# Patient Record
Sex: Female | Born: 1980
Health system: Southern US, Community
[De-identification: ages and names within clinical notes are randomized; demographics above are authoritative.]

## PROBLEM LIST (undated history)

## (undated) DIAGNOSIS — G47419 Narcolepsy without cataplexy: Secondary | ICD-10-CM

## (undated) DIAGNOSIS — F32A Depression, unspecified: Secondary | ICD-10-CM

## (undated) DIAGNOSIS — M1990 Unspecified osteoarthritis, unspecified site: Secondary | ICD-10-CM

## (undated) DIAGNOSIS — K219 Gastro-esophageal reflux disease without esophagitis: Secondary | ICD-10-CM

## (undated) DIAGNOSIS — G43701 Chronic migraine without aura, not intractable, with status migrainosus: Secondary | ICD-10-CM

## (undated) DIAGNOSIS — G709 Myoneural disorder, unspecified: Secondary | ICD-10-CM

## (undated) DIAGNOSIS — H6993 Unspecified Eustachian tube disorder, bilateral: Secondary | ICD-10-CM

## (undated) DIAGNOSIS — J302 Other seasonal allergic rhinitis: Secondary | ICD-10-CM

## (undated) DIAGNOSIS — F319 Bipolar disorder, unspecified: Secondary | ICD-10-CM

## (undated) DIAGNOSIS — I493 Ventricular premature depolarization: Secondary | ICD-10-CM

## (undated) DIAGNOSIS — D649 Anemia, unspecified: Secondary | ICD-10-CM

## (undated) DIAGNOSIS — J342 Deviated nasal septum: Secondary | ICD-10-CM

## (undated) DIAGNOSIS — L719 Rosacea, unspecified: Secondary | ICD-10-CM

## (undated) DIAGNOSIS — T7840XA Allergy, unspecified, initial encounter: Secondary | ICD-10-CM

## (undated) DIAGNOSIS — J45909 Unspecified asthma, uncomplicated: Secondary | ICD-10-CM

## (undated) DIAGNOSIS — B019 Varicella without complication: Secondary | ICD-10-CM

## (undated) DIAGNOSIS — F419 Anxiety disorder, unspecified: Secondary | ICD-10-CM

## (undated) DIAGNOSIS — F4310 Post-traumatic stress disorder, unspecified: Secondary | ICD-10-CM

## (undated) HISTORY — DX: Allergy, unspecified, initial encounter: T78.40XA

## (undated) HISTORY — DX: Myoneural disorder, unspecified: G70.9

## (undated) HISTORY — DX: Unspecified eustachian tube disorder, bilateral: H69.93

## (undated) HISTORY — DX: Deviated nasal septum: J34.2

## (undated) HISTORY — DX: Ventricular premature depolarization: I49.3

## (undated) HISTORY — DX: Varicella without complication: B01.9

## (undated) HISTORY — DX: Chronic migraine without aura, not intractable, with status migrainosus: G43.701

## (undated) HISTORY — PX: WISDOM TOOTH EXTRACTION: SHX21

## (undated) HISTORY — DX: Anemia, unspecified: D64.9

## (undated) HISTORY — PX: OTHER SURGICAL HISTORY: SHX169

## (undated) HISTORY — DX: Narcolepsy without cataplexy: G47.419

## (undated) HISTORY — DX: Anxiety disorder, unspecified: F41.9

## (undated) HISTORY — DX: Unspecified asthma, uncomplicated: J45.909

## (undated) HISTORY — DX: Other seasonal allergic rhinitis: J30.2

## (undated) HISTORY — DX: Unspecified osteoarthritis, unspecified site: M19.90

## (undated) HISTORY — DX: Post-traumatic stress disorder, unspecified: F43.10

## (undated) HISTORY — DX: Gastro-esophageal reflux disease without esophagitis: K21.9

## (undated) HISTORY — DX: Rosacea, unspecified: L71.9

## (undated) HISTORY — DX: Depression, unspecified: F32.A

## (undated) HISTORY — DX: Bipolar disorder, unspecified: F31.9

---

## 2004-01-15 ENCOUNTER — Inpatient Hospital Stay (HOSPITAL_COMMUNITY): Admission: AD | Admit: 2004-01-15 | Discharge: 2004-01-15 | Payer: Self-pay | Admitting: Obstetrics and Gynecology

## 2004-01-18 ENCOUNTER — Inpatient Hospital Stay (HOSPITAL_COMMUNITY): Admission: AD | Admit: 2004-01-18 | Discharge: 2004-01-18 | Payer: Self-pay | Admitting: Obstetrics and Gynecology

## 2004-03-03 ENCOUNTER — Inpatient Hospital Stay (HOSPITAL_COMMUNITY): Admission: AD | Admit: 2004-03-03 | Discharge: 2004-03-03 | Payer: Self-pay | Admitting: Obstetrics & Gynecology

## 2004-03-11 ENCOUNTER — Inpatient Hospital Stay (HOSPITAL_COMMUNITY): Admission: AD | Admit: 2004-03-11 | Discharge: 2004-03-13 | Payer: Self-pay | Admitting: Obstetrics and Gynecology

## 2004-06-28 ENCOUNTER — Other Ambulatory Visit: Admission: RE | Admit: 2004-06-28 | Discharge: 2004-06-28 | Payer: Self-pay | Admitting: Obstetrics and Gynecology

## 2005-04-24 ENCOUNTER — Other Ambulatory Visit: Admission: RE | Admit: 2005-04-24 | Discharge: 2005-04-24 | Payer: Self-pay | Admitting: Obstetrics and Gynecology

## 2005-04-26 ENCOUNTER — Encounter: Admission: RE | Admit: 2005-04-26 | Discharge: 2005-04-26 | Payer: Self-pay | Admitting: Obstetrics and Gynecology

## 2005-10-25 ENCOUNTER — Encounter: Admission: RE | Admit: 2005-10-25 | Discharge: 2005-10-25 | Payer: Self-pay | Admitting: Obstetrics and Gynecology

## 2006-06-20 ENCOUNTER — Emergency Department (HOSPITAL_COMMUNITY): Admission: EM | Admit: 2006-06-20 | Discharge: 2006-06-20 | Payer: Self-pay | Admitting: Family Medicine

## 2006-11-07 ENCOUNTER — Emergency Department (HOSPITAL_COMMUNITY): Admission: EM | Admit: 2006-11-07 | Discharge: 2006-11-07 | Payer: Self-pay | Admitting: Family Medicine

## 2006-11-17 ENCOUNTER — Emergency Department (HOSPITAL_COMMUNITY): Admission: EM | Admit: 2006-11-17 | Discharge: 2006-11-17 | Payer: Self-pay | Admitting: Emergency Medicine

## 2006-12-06 ENCOUNTER — Emergency Department (HOSPITAL_COMMUNITY): Admission: EM | Admit: 2006-12-06 | Discharge: 2006-12-06 | Payer: Self-pay | Admitting: Family Medicine

## 2006-12-14 ENCOUNTER — Inpatient Hospital Stay (HOSPITAL_COMMUNITY): Admission: AD | Admit: 2006-12-14 | Discharge: 2006-12-15 | Payer: Self-pay | Admitting: Obstetrics and Gynecology

## 2007-01-23 ENCOUNTER — Ambulatory Visit (HOSPITAL_COMMUNITY): Admission: RE | Admit: 2007-01-23 | Discharge: 2007-01-23 | Payer: Self-pay | Admitting: Obstetrics and Gynecology

## 2007-02-20 ENCOUNTER — Inpatient Hospital Stay (HOSPITAL_COMMUNITY): Admission: AD | Admit: 2007-02-20 | Discharge: 2007-02-22 | Payer: Self-pay | Admitting: Obstetrics and Gynecology

## 2007-03-14 ENCOUNTER — Ambulatory Visit (HOSPITAL_COMMUNITY): Admission: RE | Admit: 2007-03-14 | Discharge: 2007-03-14 | Payer: Self-pay | Admitting: Obstetrics and Gynecology

## 2007-03-14 ENCOUNTER — Inpatient Hospital Stay (HOSPITAL_COMMUNITY): Admission: AD | Admit: 2007-03-14 | Discharge: 2007-03-14 | Payer: Self-pay | Admitting: Obstetrics and Gynecology

## 2007-03-15 ENCOUNTER — Encounter: Admission: RE | Admit: 2007-03-15 | Discharge: 2007-03-15 | Payer: Self-pay | Admitting: Obstetrics and Gynecology

## 2007-03-15 ENCOUNTER — Ambulatory Visit (HOSPITAL_COMMUNITY): Admission: RE | Admit: 2007-03-15 | Discharge: 2007-03-15 | Payer: Self-pay | Admitting: Obstetrics and Gynecology

## 2007-04-15 ENCOUNTER — Inpatient Hospital Stay (HOSPITAL_COMMUNITY): Admission: AD | Admit: 2007-04-15 | Discharge: 2007-04-16 | Payer: Self-pay | Admitting: Obstetrics and Gynecology

## 2007-05-05 ENCOUNTER — Inpatient Hospital Stay (HOSPITAL_COMMUNITY): Admission: AD | Admit: 2007-05-05 | Discharge: 2007-05-09 | Payer: Self-pay | Admitting: Obstetrics and Gynecology

## 2007-06-05 ENCOUNTER — Inpatient Hospital Stay (HOSPITAL_COMMUNITY): Admission: AD | Admit: 2007-06-05 | Discharge: 2007-06-07 | Payer: Self-pay | Admitting: Obstetrics and Gynecology

## 2007-08-30 ENCOUNTER — Encounter: Admission: RE | Admit: 2007-08-30 | Discharge: 2007-08-30 | Payer: Self-pay | Admitting: Orthopaedic Surgery

## 2007-09-24 ENCOUNTER — Ambulatory Visit (HOSPITAL_BASED_OUTPATIENT_CLINIC_OR_DEPARTMENT_OTHER): Admission: RE | Admit: 2007-09-24 | Discharge: 2007-09-24 | Payer: Self-pay | Admitting: Orthopaedic Surgery

## 2008-05-05 ENCOUNTER — Emergency Department (HOSPITAL_BASED_OUTPATIENT_CLINIC_OR_DEPARTMENT_OTHER): Admission: EM | Admit: 2008-05-05 | Discharge: 2008-05-05 | Payer: Self-pay | Admitting: Emergency Medicine

## 2008-05-12 ENCOUNTER — Emergency Department (HOSPITAL_BASED_OUTPATIENT_CLINIC_OR_DEPARTMENT_OTHER): Admission: EM | Admit: 2008-05-12 | Discharge: 2008-05-12 | Payer: Self-pay | Admitting: Emergency Medicine

## 2008-05-30 ENCOUNTER — Emergency Department (HOSPITAL_BASED_OUTPATIENT_CLINIC_OR_DEPARTMENT_OTHER): Admission: EM | Admit: 2008-05-30 | Discharge: 2008-05-30 | Payer: Self-pay | Admitting: Emergency Medicine

## 2008-11-05 ENCOUNTER — Ambulatory Visit (HOSPITAL_COMMUNITY): Admission: RE | Admit: 2008-11-05 | Discharge: 2008-11-05 | Payer: Self-pay | Admitting: Orthopaedic Surgery

## 2010-03-06 ENCOUNTER — Emergency Department (HOSPITAL_COMMUNITY): Admission: EM | Admit: 2010-03-06 | Discharge: 2010-03-06 | Payer: Self-pay | Admitting: Emergency Medicine

## 2010-07-19 ENCOUNTER — Ambulatory Visit (HOSPITAL_BASED_OUTPATIENT_CLINIC_OR_DEPARTMENT_OTHER): Admission: RE | Admit: 2010-07-19 | Discharge: 2010-07-19 | Payer: Self-pay | Admitting: Orthopaedic Surgery

## 2010-12-11 ENCOUNTER — Encounter: Payer: Self-pay | Admitting: Obstetrics and Gynecology

## 2011-02-03 LAB — POCT HEMOGLOBIN-HEMACUE: Hemoglobin: 11.6 g/dL — ABNORMAL LOW (ref 12.0–15.0)

## 2011-04-04 NOTE — Discharge Summary (Signed)
Debra Nelson, Debra Nelson             ACCOUNT NO.:  [id number]   MEDICAL RECORD NO.:  [id number]          PATIENT TYPE:  INP   LOCATION:  9314                          FACILITY:  [facility]   PHYSICIAN:  Malva Limes, M.D.    DATE OF BIRTH:  07/12/1981   DATE OF ADMISSION:  02/20/2007  DATE OF DISCHARGE:  02/22/2007                               DISCHARGE SUMMARY   FINAL DIAGNOSIS:  Intrauterine gestation at 23 weeks, second trimester  nausea and vomiting, past history of preterm labor, depressive symptoms.   COMPLICATIONS:  None.   HISTORY OF PRESENT ILLNESS:  This 30 year old gravida 3, para 2 presents  at 107 weeks gestation complaining of nausea and vomiting since the prior  night. She thought it was a viral illness but came back and the patient  is currently on Zofran 8 mg t.i.d., Lexapro 10 mg, Scopolamine patch,  Protonix and Delalutin.  The patient had a history of preterm labor with  past pregnancy and is on Delalutin weekly injections this pregnancy. The  patient has also had this history of nausea and vomiting.  Therefore is  on some antiemetic medications. Otherwise the patient's antepartum  course up to this point had otherwise been uncomplicated.   HOSPITAL COURSE:  She was admitted and lab work was obtained.  The  patient was started on IV fluids, Zofran, Phenergan, Pepcid. A right  upper quadrant ultrasound was obtained to rule out gallstones. The  patient's labs were essentially normal and she had another negative  urinalysis as well. The patient was continued to be was watched in-  house. The patient continued to take medicines, tried to take Phenergan  rectal suppositories. She was feeling much better as long as she takes  her meds and ready to go home. She was sent home by Dr. Malva Limes on  February 22, 2007.  She was sent home on a bland diet, told to continue  taking her Reglan 10 mg every 6 hours, to continue her Phenergan as  needed and Zofran and prenatal vitamins,  was to follow up in our office  the next couple of days, of course to call if this continuous nausea and  vomiting returns.   PRIOR LABS ON DISCHARGE:  Relatively normal. Hemoglobin of 12.0, white  blood cell count of 10.5, platelets 318,000. Normal creatinine and  normal liver enzymes, and normal thyroid TSH.      Debra Nelson, P.A.-C.    ______________________________  Malva Limes, M.D.    MB/MEDQ  D:  04/09/2007  T:  04/09/2007  Job:  [id number]

## 2011-04-04 NOTE — Op Note (Signed)
Debra Nelson, Debra Nelson             ACCOUNT NO.:  [id number]   MEDICAL RECORD NO.:  [id number]          PATIENT TYPE:  AMB   LOCATION:  DSC                          FACILITY:  [facility]   PHYSICIAN:  Lubertha Basque. Dalldorf, M.D.DATE OF BIRTH:  1981/11/05   DATE OF PROCEDURE:  09/24/2007  DATE OF DISCHARGE:  09/24/2007                               OPERATIVE REPORT   PREOPERATIVE DIAGNOSIS:  Right knee torn medial meniscus.   POSTOPERATIVE DIAGNOSIS:  Right knee plica.   PROCEDURE:  Right knee plica excision.   ANESTHESIA:  Knee block and general.   ATTENDING SURGEON:  Lubertha Basque. Jerl Santos, M.D.   ASSISTANT:  Lindwood Qua, P.A.-C.   INDICATIONS FOR PROCEDURE:  The patient is a 30 year old woman who  injured her knee about 3-4 weeks ago and has persisted with buckling  episodes and an inability to fully extend.  By MRI, she has a small  peripheral medial meniscus tear.  With an inability to fully extend her  knee and failing to respond to intra-articular injection, she is offered  an arthroscopy.  Informed operative consent was obtained after a  discussion of possible complications of reaction to anesthesia and  infection.   SUMMARY OF FINDINGS AND PROCEDURE:  Under knee block followed by a  general anesthetic, an arthroscopy of the right knee was performed.  Suprapatellar pouch was benign as was the patellofemoral joint.  She did  have a pathologic medial shelf plica which I excised.  Medial and  lateral compartments were benign with no evidence of meniscal or  articular cartilage injury.  The ACL was intact.   DESCRIPTION OF PROCEDURE:  The patient was taken to the operating suite  where a knee block was applied.  She was positioned supine and prepped  and draped in a normal sterile fashion.  After the administration of IV  Kefzol, an arthroscopy right knee was initiated through two portals.  She had difficulty with pain and after supplemental anesthetic around  portal was  unsuccessful, we elected to proceed to an LMA general  anesthetic.  Once comfortable, we then performed the procedure.  The  findings were as noted above.  The procedure consisted of excision of  the medial shelf plica.  I probed meniscal and articular cartilages in  both compartments as well as the patellofemoral joint and no significant  damage could be found.  Her ACL appeared completely normal.  The knee  was thoroughly irrigated followed by placement of Marcaine with  epinephrine and morphine.  Adaptic was placed over the portals followed  by dry gauze and a loose Ace wrap.  Estimated blood loss and  intraoperative fluids can be obtained from anesthesia records.   DISPOSITION:  The patient was extubated in the operating room and taken  to the recovery room in stable addition.  She was to go home same day  and follow up in the office once a week.  I will contact her by phone  tonight.      Lubertha Basque Jerl Santos, M.D.  Electronically Signed     PGD/MEDQ  D:  09/24/2007  T:  09/24/2007  Job:  [id number]

## 2011-04-04 NOTE — Discharge Summary (Signed)
Debra Nelson, GUEST             ACCOUNT NO.:  [id number]   MEDICAL RECORD NO.:  [id number]          PATIENT TYPE:  INP   LOCATION:  9159                          [facility]ILITY:  [facility]   PHYSICIAN:  Malva Limes, M.D.    DATE OF BIRTH:  12-Oct-1981   DATE OF ADMISSION:  05/05/2007  DATE OF DISCHARGE:  05/09/2007                               DISCHARGE SUMMARY   FINAL DIAGNOSES:  1. Intrauterine pregnancy 33+ weeks' gestation.  2. Malaise.  3. Headache.  4. Preterm uterine contractions.   COMPLICATIONS:  None.   HOSPITAL COURSE:  This 30 year old G3, P2-0-0-2 presents at 33+ weeks'  gestation, complaining of a single episode of blood and questionable  fluid lying down her leg earlier; this stopped immediately and she was  told to come directly to the [facility].  On admission, this was  no longer happening and an ultrasound was ordered.  At this point, the  patient became unresponsive, but awoke oriented.  Her blood pressure had  been low at that time, but her blood sugar came back to be 95.  All of  the patient's bleeding stopped.  She was kept in the hospital for  observation.  On ultrasound, the cervix was 3.8 cm with a posterior  placenta, no sign of abruption and a normal AFI.  During this time, the  patient started to have some on and off headaches, was not feeling well  and started to develop some preterm contractions on hospital day #3; she  was started on magnesium sulfate.  There was no change her cervix.  During this time, she was having good reassuring fetal movement and  strips.  On exam on June 18, there was no change in the patient's  cervix.  Magnesium sulfate was stopped and Dr. Edward Jolly recommended not  doing any further oral tocolysis.  She had a negative fetal fibronectin.  By June 19, the patient had some contractions which was stopped.  Her  fetal fibronectin, like I said, had come back negative and her cervix  was not changed.  At this point, her episode of  bleeding that happened 3  days prior has not happened since.  She was felt ready for discharge.   DISCHARGE MEDICATIONS:  1. She was sent home with Vicodin to take one to two every 4 hours as      needed for pain.  2. She was given some Ambien 10 mg to take at night to help sleep.  3. Terbutaline 2.5 mg every 4 hours to help with contractions.   FOLLOWUP:  She was to follow up in our office on that Monday and of  course to call with any increase in her symptoms or change in her  symptoms.   LABORATORY DATA ON DISCHARGE:  The patient had a hemoglobin of 10.3,  white blood cell count of 10.6, platelets of 324,000.      [name], P.A.-C.    ______________________________  Malva Limes, M.D.    MB/MEDQ  D:  06/07/2007  T:  06/08/2007  Job:  [id number]

## 2011-04-07 NOTE — Op Note (Signed)
NAME:  Debra Nelson, Debra Nelson                       ACCOUNT NO.:  [id number]   MEDICAL RECORD NO.:  [id number]                   PATIENT TYPE:  INP   LOCATION:  9132                                 [facility]ACILITY:  [facility]   PHYSICIAN:  Randye Lobo, M.D.                DATE OF BIRTH:  1981-02-28   DATE OF PROCEDURE:  03/11/2004  DATE OF DISCHARGE:  03/13/2004                                 OPERATIVE REPORT   PREOPERATIVE DIAGNOSES:  1. Intrauterine gestation at 47 plus 4 weeks.  2. Gestational hypertension.  3. Maternal exhaustion.   POSTOPERATIVE DIAGNOSES:  1. Intrauterine gestation at 65 plus 4 weeks.  2. Gestational hypertension.  3. Maternal exhaustion.  4. Mild shoulder dystocia.   PROCEDURE:  Vacuum-assisted vaginal delivery with episiotomy and repair of  third degree extension.   SURGEON:  Randye Lobo, M.D.   ESTIMATED BLOOD LOSS:  600 mL.   URINE OUTPUT:  25 mL prior to procedure.   COMPLICATIONS:  None.   INDICATION FOR PROCEDURE:  The patient is a 30 year old gravida 2, para 0-1-  0-1, Caucasian female, who presented to the office on March 11, 2004, at 26  + 4 weeks' gestation, reporting suprapubic discomfort, headache, and lower  extremity edema.  The patient's blood pressure in the office was noted to be  160/100, and the urine was noted to have trace protein.  The patient's  cervix was 3 cm dilated and 90% effaced with the vertex at the -2 station,  and bulging membranes were appreciated.  The patient was diagnosed with  gestational hypertension versus mild preeclampsia, and she was sent to the  [facility] for further evaluation and induction of  labor.   When the patient arrived at the [facility] [place], the fetal  heart rate tracing was noted to be reactive and the patient was having  contractions every three minutes.  Artificial rupture of membranes was  performed and clear fluid was noted.  The patient went on to receive  Stadol  and then an epidural for anesthesia.  She had a normal labor curve and  achieved complete cervical dilatation by 1734.  Of note, the patient was  noted to have hypokalemia with a potassium level of 2.5.  This was noted  just shortly before the patient began pushing.   The patient went on to push for approximately three hours, at which time she  developed maternal exhaustion and the vertex was at the +3 station in the  right occiput anterior position.  At this time, the patient was consented  for an episiotomy with a vacuum-assisted vaginal delivery after risks and  benefits were reviewed and she chose to proceed.   FINDINGS:  A viable female was delivered at 2038 with Apgars of 8 at one  minute and 9 at five minutes.  The baby was in an ROA position.  There was a  body  cord x1 noted.  The newborn was delivered after relief of a mild  shoulder dystocia with a McRoberts maneuver.  The newborn was noted to have  a temperature to 102.6 degrees Fahrenheit shortly after delivery, and the  maternal temperature was 99 degrees Fahrenheit.  The placenta had a normal  insertion of a three-vessel cord.   PROCEDURE:  The patient was examined and the vertex was noted to be at the  +3 station in the right occiput anterior position.  The patient was I&O  catheterized with a red rubber catheter.  The perineum was injected with 1%  lidocaine and a midline episiotomy was performed.  The Mityvac was then  placed over the vertex and was used over two contractions, at which time the  vertex was delivered.  There was some retraction of the head shortly after  delivery, and a mild shoulder was encountered and relieved with a McRoberts  maneuver without difficulty.  The newborn was delivered in vigorous  condition.  The cord was doubly clamped and cut and the newborn was  suctioned of the nares and mouth.  The placenta was then spontaneously  delivered and the patient did go on to develop some uterine  atony, which was  treated with Pitocin 20 units intravenously in 1 L of fluid.   The patient was examined and the cervix and vagina were without lacerations.  There was a third degree extension of he episiotomy.  The repair was  performed in standard fashion with a combination of 0 and 2-0 Vicryl.   Shortly after delivery, the patient did develop hypotension with a blood  pressure of 87/36, for which she was asymptomatic, with congestion in her  ears.  The patient was treated with IV fluid boluses and oxygen and then  ultimately did receive ephedrine 10 mg intravenously.  This brought her  blood pressure up to 112/65 with a heart rate of 112, at which time the  patient was feeling better.  Her lungs remained clear to auscultation and  her heart rate demonstrated the tachycardia as noted above.  The patient was  stabilized at this time.  She will receive her potassium replacement  intravenously.  She will be observed on labor and delivery before being  transferred to her postpartum room.   There were no complications to the procedure.  All needle, instrument, and  sponge counts were correct.                                               Randye Lobo, M.D.    BES/MEDQ  D:  03/11/2004  T:  03/13/2004  Job:  [id number]

## 2011-08-17 LAB — URINE MICROSCOPIC-ADD ON

## 2011-08-17 LAB — BASIC METABOLIC PANEL
BUN: 15
CO2: 26
Calcium: 8.9
Chloride: 103
Creatinine, Ser: 0.7
GFR calc Af Amer: >60
GFR calc non Af Amer: >60
Glucose, Bld: 92
Potassium: 3.8
Sodium: 140

## 2011-08-17 LAB — CBC
HCT: 39.7
Hemoglobin: 13.9
MCHC: 35.1
MCV: 93
Platelets: 254
RBC: 4.27
RDW: 12.4
WBC: 6.4

## 2011-08-17 LAB — DIFFERENTIAL
Basophils Absolute: 0
Basophils Relative: 1
Eosinophils Absolute: 0.1
Eosinophils Relative: 1
Lymphocytes Relative: 36
Lymphs Abs: 2.3
Monocytes Absolute: 0.4
Monocytes Relative: 6
Neutro Abs: 3.6
Neutrophils Relative %: 55

## 2011-08-17 LAB — URINALYSIS, ROUTINE W REFLEX MICROSCOPIC
Glucose, UA: NEGATIVE
Hgb urine dipstick: NEGATIVE
Ketones, ur: 40 — AB
Nitrite: POSITIVE — AB
Protein, ur: 30 — AB
Specific Gravity, Urine: 1.033 — ABNORMAL HIGH
Urobilinogen, UA: 4 — ABNORMAL HIGH
pH: 5

## 2011-08-17 LAB — URINE CULTURE: Colony Count: >=100000

## 2011-08-17 LAB — PREGNANCY, URINE: Preg Test, Ur: NEGATIVE

## 2011-08-29 LAB — POCT HEMOGLOBIN-HEMACUE
Hemoglobin: 12.3
Operator id: 112821

## 2011-09-04 LAB — CBC
HCT: 30.1 — ABNORMAL LOW
HCT: 34.4 — ABNORMAL LOW
Hemoglobin: 10.5 — ABNORMAL LOW
Hemoglobin: 11.6 — ABNORMAL LOW
MCHC: 33.8
MCHC: 34.8
MCV: 91.2
MCV: 92
Platelets: 244
Platelets: 298
RBC: 3.3 — ABNORMAL LOW
RBC: 3.74 — ABNORMAL LOW
RDW: 12.8
RDW: 13
WBC: 10
WBC: 14.4 — ABNORMAL HIGH

## 2011-09-04 LAB — RPR: RPR Ser Ql: NONREACTIVE

## 2011-09-06 LAB — COMPREHENSIVE METABOLIC PANEL
ALT: 11
ALT: <8
AST: 11
AST: 14
Albumin: 2.1 — ABNORMAL LOW
Albumin: 2.5 — ABNORMAL LOW
Alkaline Phosphatase: 100
Alkaline Phosphatase: 91
BUN: 1 — ABNORMAL LOW
BUN: 2 — ABNORMAL LOW
CO2: 25
CO2: 28
Calcium: 7.2 — ABNORMAL LOW
Calcium: 8.9
Chloride: 105
Chloride: 107
Creatinine, Ser: 0.42
Creatinine, Ser: 0.48
GFR calc Af Amer: >60
GFR calc Af Amer: >60
GFR calc non Af Amer: >60
GFR calc non Af Amer: >60
Glucose, Bld: 89
Glucose, Bld: 96
Potassium: 3.4 — ABNORMAL LOW
Potassium: 3.6
Sodium: 137
Sodium: 138
Total Bilirubin: 0.5
Total Bilirubin: 0.5
Total Protein: 4.6 — ABNORMAL LOW
Total Protein: 5.6 — ABNORMAL LOW

## 2011-09-06 LAB — CBC
HCT: 30 — ABNORMAL LOW
HCT: 31.1 — ABNORMAL LOW
Hemoglobin: 10.3 — ABNORMAL LOW
Hemoglobin: 10.7 — ABNORMAL LOW
MCHC: 34.3
MCHC: 34.5
MCV: 92.4
MCV: 93.2
Platelets: 324
Platelets: 324
RBC: 3.22 — ABNORMAL LOW
RBC: 3.37 — ABNORMAL LOW
RDW: 13
RDW: 13.2
WBC: 10.6 — ABNORMAL HIGH
WBC: 11.9 — ABNORMAL HIGH

## 2011-09-06 LAB — URINALYSIS, ROUTINE W REFLEX MICROSCOPIC
Bilirubin Urine: NEGATIVE
Glucose, UA: NEGATIVE
Hgb urine dipstick: NEGATIVE
Ketones, ur: NEGATIVE
Nitrite: NEGATIVE
Protein, ur: NEGATIVE
Specific Gravity, Urine: <1.005 — ABNORMAL LOW
Urobilinogen, UA: 0.2
pH: 6.5

## 2011-09-06 LAB — DIFFERENTIAL
Basophils Absolute: 0
Basophils Relative: 0
Eosinophils Absolute: 0.1
Eosinophils Relative: 1
Lymphocytes Relative: 17
Lymphs Abs: 1.8
Monocytes Absolute: 0.7
Monocytes Relative: 6
Neutro Abs: 8 — ABNORMAL HIGH
Neutrophils Relative %: 76

## 2011-09-06 LAB — FETAL FIBRONECTIN: Fetal Fibronectin: NEGATIVE

## 2011-09-19 ENCOUNTER — Ambulatory Visit (HOSPITAL_COMMUNITY)
Admission: RE | Admit: 2011-09-19 | Discharge: 2011-09-19 | Disposition: A | Discharge status: Home / Self Care | Payer: 59 | Source: Ambulatory Visit | Attending: Orthopedic Surgery | Admitting: Orthopedic Surgery

## 2011-09-19 DIAGNOSIS — M79609 Pain in unspecified limb: Secondary | ICD-10-CM | POA: Insufficient documentation

## 2011-09-19 DIAGNOSIS — M7989 Other specified soft tissue disorders: Secondary | ICD-10-CM | POA: Insufficient documentation

## 2012-01-02 ENCOUNTER — Encounter: Payer: 59 | Admitting: Physical Medicine & Rehabilitation

## 2013-03-07 DIAGNOSIS — M94269 Chondromalacia, unspecified knee: Secondary | ICD-10-CM | POA: Insufficient documentation

## 2013-12-19 ENCOUNTER — Encounter: Payer: Self-pay | Admitting: General Surgery

## 2013-12-30 ENCOUNTER — Ambulatory Visit (INDEPENDENT_AMBULATORY_CARE_PROVIDER_SITE_OTHER): Payer: 59 | Admitting: Cardiology

## 2013-12-30 ENCOUNTER — Encounter: Payer: Self-pay | Admitting: Cardiology

## 2013-12-30 VITALS — BP 110/70 | HR 78 | Ht <= 58 in

## 2013-12-30 DIAGNOSIS — G4752 REM sleep behavior disorder: Secondary | ICD-10-CM | POA: Insufficient documentation

## 2013-12-30 DIAGNOSIS — Z0389 Encounter for observation for other suspected diseases and conditions ruled out: Secondary | ICD-10-CM

## 2013-12-30 DIAGNOSIS — G4733 Obstructive sleep apnea (adult) (pediatric): Secondary | ICD-10-CM

## 2013-12-30 DIAGNOSIS — G4700 Insomnia, unspecified: Secondary | ICD-10-CM | POA: Insufficient documentation

## 2013-12-30 DIAGNOSIS — G479 Sleep disorder, unspecified: Secondary | ICD-10-CM

## 2013-12-30 DIAGNOSIS — G4710 Hypersomnia, unspecified: Secondary | ICD-10-CM | POA: Insufficient documentation

## 2013-12-30 NOTE — Patient Instructions (Signed)
Your physician recommends that you continue on your current medications as directed. Please refer to the Current Medication list given to you today.  You have been referred to [facility]  [address] [place], [place] [address] Tel: [phone] Fax: [phone] They will be contacting you with an appointment
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
## 2013-12-30 NOTE — Telephone Encounter (Signed)
This encounter was created in error - please disregard.

## 2013-12-31 ENCOUNTER — Ambulatory Visit (INDEPENDENT_AMBULATORY_CARE_PROVIDER_SITE_OTHER): Payer: 59 | Admitting: Neurology

## 2013-12-31 ENCOUNTER — Encounter: Payer: Self-pay | Admitting: Neurology

## 2013-12-31 VITALS — BP 109/75 | HR 85 | Resp 16 | Ht 63.0 in | Wt 161.0 lb

## 2013-12-31 DIAGNOSIS — G4710 Hypersomnia, unspecified: Secondary | ICD-10-CM

## 2013-12-31 DIAGNOSIS — F3110 Bipolar disorder, current episode manic without psychotic features, unspecified: Secondary | ICD-10-CM

## 2013-12-31 DIAGNOSIS — F3181 Bipolar II disorder: Secondary | ICD-10-CM | POA: Insufficient documentation

## 2013-12-31 MED ORDER — ESCITALOPRAM OXALATE 5 MG PO TABS: 15.0000 mg | ORAL_TABLET | Freq: Every day | ORAL | Status: DC

## 2013-12-31 NOTE — Progress Notes (Signed)
Patient here seeing Dr. Brett Fairy.  Order for HLA typing blood draw.  Under aseptic technique, blood drawn from left AC, using a 23g butterfly.  Tolerated well.

## 2013-12-31 NOTE — Patient Instructions (Addendum)
Polysomnography (Sleep Studies) Polysomnography (PSG) is a series of tests used for detecting (diagnosing) obstructive sleep apnea and other sleep disorders. The tests measure how some parts of your body are working while you are sleeping. The tests are extensive and expensive. They are done in a sleep lab or hospital, and vary from center to center. Your caregiver may perform other more simple sleep studies and questionnaires before doing more complete and involved testing. Testing may not be covered by insurance. Some of these tests are:  An EEG (Electroencephalogram). This tests your brain waves and stages of sleep.  An EOG (Electrooculogram). This measures the movements of your eyes. It detects periods of REM (rapid eye movement) sleep, which is your dream sleep.  An EKG (Electrocardiogram). This measures your heart rhythm.  EMG (Electromyography). This is a measurement of how the muscles are working in your upper airway and your legs while sleeping.  An oximetry measurement. It measures how much oxygen (air) you are getting while sleeping.  Breathing efforts may be measured. The same test can be interpreted (understood) differently by different caregivers and centers that study sleep.  Studies may be given an apnea/hypopnea index (AHI). This is a number which is found by counting the times of no breathing or under breathing during the night, and relating those numbers to the amount of time spent in bed. When the AHI is greater than 15, the patient is likely to complain of daytime sleepiness. When the AHI is greater than 30, the patient is at increased risk for heart problems and must be followed more closely. Following the AHI also allows you to know how treatment is working. Simple oximetry (tracking the amount of oxygen that is taken in) can be used for screening patients who:  Do not have symptoms (problems) of OSA.  Have a normal Epworth Sleepiness Scale Score.  Have a low pre-test  probability of having OSA.  Have none of the upper airway problems likely to cause apnea.  Oximetry is also used to determine if treatment is effective in patients who showed significant desaturations (not getting enough oxygen) on their home sleep study. One extra measure of safety is to perform additional studies for the person who only snores. This is because no one can predict with absolute certainty who will have OSA. Those who show significant desaturations (not getting enough oxygen) are recommended to have a more detailed sleep study. Document Released: 05/13/2003 Document Revised: 01/29/2012 Document Reviewed: 11/06/2005 [facility] Patient Information 2014 [place]. Hypersomnia Hypersomnia usually brings recurrent episodes of excessive daytime sleepiness or prolonged nighttime sleep. It is different than feeling tired due to lack of or interrupted sleep at night. People with hypersomnia are compelled to nap repeatedly during the day. This is often at inappropriate times such as:  At work.  During a meal.  In conversation. These daytime naps usually provide no relief. This disorder typically affects adolescents and young adults. CAUSES  This condition may be caused by:  Another sleep disorder (such as narcolepsy or sleep apnea).  Dysfunction of the autonomic nervous system.  Drug or alcohol abuse.  A physical problem, such as:  A tumor.  Head trauma. This is damage caused by an accident.  Injury to the central nervous system.  Certain medications, or medicine withdrawal.  Medical conditions may contribute to the disorder, including:  Multiple sclerosis.  Depression.  Encephalitis.  Epilepsy.  Obesity.  Some people appear to have a genetic predisposition to this disorder. In others, there is  no known cause. SYMPTOMS   Patients often have difficulty waking from a long sleep. They may feel dazed or confused.  Other symptoms may  include:  Anxiety.  Increased irritation (inflammation).  Decreased energy.  Restlessness.  Slow thinking.  Slow speech.  Loss of appetite.  Hallucinations.  Memory difficulty.  Tremors, Tics.  Some patients lose the ability to function in family, social, occupational, or other settings. TREATMENT  Treatment is symptomatic in nature. Stimulants and other drugs may be used to treat this disorder. Changes in behavior may help. For example, avoid night work and social activities that delay bed time. Changes in diet may offer some relief. Patients should avoid alcohol and caffeine. PROGNOSIS  The likely outcome (prognosis) for persons with hypersomnia depends on the cause of the disorder. The disorder itself is not life threatening. But it can have serious consequences. For example, automobile accidents can be caused by falling asleep while driving. The attacks usually continue indefinitely. Document Released: 10/27/2002 Document Revised: 01/29/2012 Document Reviewed: 09/30/2008 [facility] Patient Information 2014 [place], [place].
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
## 2014-01-27 ENCOUNTER — Ambulatory Visit (INDEPENDENT_AMBULATORY_CARE_PROVIDER_SITE_OTHER): Payer: 59 | Admitting: Neurology

## 2014-01-27 DIAGNOSIS — G4710 Hypersomnia, unspecified: Secondary | ICD-10-CM

## 2014-01-27 DIAGNOSIS — F3110 Bipolar disorder, current episode manic without psychotic features, unspecified: Secondary | ICD-10-CM

## 2014-01-27 DIAGNOSIS — G47421 Narcolepsy in conditions classified elsewhere with cataplexy: Secondary | ICD-10-CM

## 2014-01-28 DIAGNOSIS — G4710 Hypersomnia, unspecified: Secondary | ICD-10-CM

## 2014-01-28 DIAGNOSIS — G47421 Narcolepsy in conditions classified elsewhere with cataplexy: Secondary | ICD-10-CM

## 2014-01-28 NOTE — Sleep Study (Signed)
See media tab for full report  

## 2014-02-04 NOTE — Sleep Study (Signed)
MSLT Nap Questionnaire  PRE NAP SLEEPINESS SCALE 1-7  1 - Feeling Active and vital, alert, wide awake  2 - Functioning at a high level, but not at peak, able to concentrate  3 - Relaxed and awake, not at full alertness, responsive  4- A little foggy, let down  5 - Fogginess, beginning to lose interest in remaining awake  6 - Sleepiness, prefer to be lying down, fighting sleep, woozy  7 - Almost in reverie, sleep onset soon, losing struggle to remain awake  =============================================================== Nap #1 Pre nap level of sleepiness:   Lights out time:  Comments:  Post Nap Questions for nap #1:  Did you sleep?  Did you dream?  ================================================================ Nap #2 Pre nap level of sleepiness:   Lights out time:  Comments:  Post Nap Questions for nap #2:  Did you sleep?  Did you dream? ================================================================ Nap #3 Pre nap level of sleepiness:   Lights out time:  Comments:  Post Nap Questions for Nap #3:  Did you sleep?  Did you dream?  =============================================================== Nap #4 Pre nap level of sleepiness:   Lights out time:  Comments:  Post Nap Questions for Nap #4:  Did you sleep?  Did you dream?  =============================================================== Nap #5 Pre nap level of sleepiness:   Lights out time:  Comments:  Post Nap Questions for Nap #5:  Did you sleep?  Did you dream?  ===============================================================

## 2014-02-12 ENCOUNTER — Encounter: Payer: Self-pay | Admitting: *Deleted

## 2014-02-12 ENCOUNTER — Telehealth: Payer: Self-pay | Admitting: Neurology

## 2014-02-12 NOTE — Telephone Encounter (Signed)
I called and spoke with the patient about her recent sleep study results. I informed the patient that the MSLT study was abnormal and that the study is consistent with hypersomnolence and the diagnosis of narcolepsy. Informed the patient that Dr. Brett Fairy would like to see her to discuss treatment options, patient has been scheduled for April 17,2015 at 2:30 arrival time 2:15. I will fax a copy of the report to Dr. Geraldo Docker office and mail a copy to the patient.

## 2014-03-06 ENCOUNTER — Encounter: Payer: Self-pay | Admitting: Neurology

## 2014-03-06 ENCOUNTER — Ambulatory Visit (INDEPENDENT_AMBULATORY_CARE_PROVIDER_SITE_OTHER): Payer: 59 | Admitting: Neurology

## 2014-03-06 VITALS — BP 116/84 | HR 64 | Ht 62.5 in | Wt 166.0 lb

## 2014-03-06 DIAGNOSIS — G47411 Narcolepsy with cataplexy: Secondary | ICD-10-CM

## 2014-03-06 DIAGNOSIS — Z79899 Other long term (current) drug therapy: Secondary | ICD-10-CM

## 2014-03-06 DIAGNOSIS — F515 Nightmare disorder: Secondary | ICD-10-CM

## 2014-03-06 DIAGNOSIS — IMO0002 Reserved for concepts with insufficient information to code with codable children: Secondary | ICD-10-CM

## 2014-03-06 MED ORDER — ARMODAFINIL 250 MG PO TABS: 250.0000 mg | ORAL_TABLET | Freq: Every day | ORAL | Status: DC

## 2014-03-06 MED ORDER — QUETIAPINE FUMARATE 25 MG PO TABS: 25.0000 mg | ORAL_TABLET | Freq: Every day | ORAL | Status: DC
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
## 2014-03-06 NOTE — Patient Instructions (Signed)
Narcolepsy Narcolepsy is a disabling neurological disorder of sleep regulation. It affects the control of sleep. It also affects the control of wakefulness. It is an interruption of the dreaming state of sleep. This state is known as REM or rapid eye movement sleep.  SYMPTOMS  The development, number, and severity of symptoms vary widely among people with the disorder. Symptoms generally begin between the ages of 15 and 30. The four classic symptoms of the disorder are:   Excessive daytime sleepiness.  Cataplexy. This is sudden, brief episodes of muscle weakness or paralysis. It is caused by strong emotions. Common strong emotions are laughter, anger, surprise, or anticipation.  Sleep paralysis. This is paralysis upon falling asleep or waking up.  Hallucinations. These are vivid dream-like images that occur at when you first fall asleep. Other symptoms include:   Unrelenting excessive sleepiness. This is usually the first and most obvious symptom.  Sleep attacks. Patients have strong sleep attacks throughout the day. These attacks can last for 30 seconds to more than 30 minutes. These happen no matter how much or how well the person slept the night before. These attacks end up making the person sleep at work and social events. The person can fall asleep while eating, talking, and driving. They also fall asleep at other out of place times.  Disturbed nighttime sleep.  Tossing and turning in bed.  Leg jerks.  Nightmares.  Waking up often. DIAGNOSIS  It's possible that genetics play a role in this disorder. Narcolepsy is not a rare disorder. It is often misdiagnosed. It is often diagnosed years after symptoms first appear. Early diagnosis and treatment are important. This help the physical and mental well-being of the patient. TREATMENT  There is no cure for narcolepsy. The symptoms can be controlled with behavioral and medical therapy. The excessive daytime sleepiness may be treated with  stimulant drugs. It may also be treated with the drug modafinil (Provigil). Cataplexy and other REM-sleep symptoms may be treated with antidepressant medications. Medications will reduce the symptoms. Medications will not ease symptoms entirely. Many available medications have side effects. Basic lifestyle changes may also reduce the symptoms. These changes include having regular sleep schedules and scheduled daytime naps. Other lifestyle changes include avoiding "over-stimulating" situations. Document Released: 10/27/2002 Document Revised: 01/29/2012 Document Reviewed: 11/06/2005 [employer] Patient Information 2014 [employer].  

## 2014-03-07 LAB — COMPREHENSIVE METABOLIC PANEL
ALT: 19 IU/L (ref 0–32)
AST: 22 IU/L (ref 0–40)
Albumin/Globulin Ratio: 1.7 (ref 1.1–2.5)
Albumin: 4.3 g/dL (ref 3.5–5.5)
Alkaline Phosphatase: 120 IU/L — ABNORMAL HIGH (ref 39–117)
BUN/Creatinine Ratio: 6 — ABNORMAL LOW (ref 8–20)
BUN: 4 mg/dL — ABNORMAL LOW (ref 6–20)
CO2: 26 mmol/L (ref 18–29)
Calcium: 10.1 mg/dL (ref 8.7–10.2)
Chloride: 99 mmol/L (ref 97–108)
Creatinine, Ser: 0.71 mg/dL (ref 0.57–1.00)
GFR calc Af Amer: 128 mL/min/{1.73_m2} (ref >59)
GFR calc non Af Amer: 111 mL/min/{1.73_m2} (ref >59)
Globulin, Total: 2.5 g/dL (ref 1.5–4.5)
Glucose: 91 mg/dL (ref 65–99)
Potassium: 4 mmol/L (ref 3.5–5.2)
Sodium: 141 mmol/L (ref 134–144)
Total Bilirubin: 0.4 mg/dL (ref 0.0–1.2)
Total Protein: 6.8 g/dL (ref 6.0–8.5)

## 2014-03-25 ENCOUNTER — Telehealth: Payer: Self-pay | Admitting: *Deleted

## 2014-03-25 NOTE — Telephone Encounter (Signed)
Please call : I have tested her for narcolepsy , the sleepiness component- HLA test was positive.  For insomnia , she needs to follow up with [facility] and colleagues at [facility] psychiat[facility].  Seroquel did help for a while, higher doses can be managed better by psych.

## 2014-03-25 NOTE — Telephone Encounter (Signed)
Patient calling and stated QUEtiapine (SEROQUEL) 25 MG tablet is not working and does she need to increase dosage.  Please call and advise.

## 2014-03-27 NOTE — Progress Notes (Signed)
Quick Note:  Spoke to patient and relayed lab results, per Dr. Brett Fairy. Patient expressed understanding. ______

## 2014-03-27 NOTE — Telephone Encounter (Signed)
Spoke to patient and relayed that the doctor wanted her to follow up with psychiatry to manage higher doses of Seroquel.  Patient expressed understanding.

## 2014-04-27 ENCOUNTER — Other Ambulatory Visit: Payer: Self-pay | Admitting: Neurology

## 2014-04-27 DIAGNOSIS — F515 Nightmare disorder: Secondary | ICD-10-CM

## 2014-04-27 DIAGNOSIS — G47411 Narcolepsy with cataplexy: Secondary | ICD-10-CM

## 2014-04-27 MED ORDER — QUETIAPINE FUMARATE 25 MG PO TABS: 25.0000 mg | ORAL_TABLET | Freq: Every day | ORAL | Status: DC

## 2014-06-10 ENCOUNTER — Telehealth: Payer: Self-pay | Admitting: Neurology

## 2014-06-10 DIAGNOSIS — G47411 Narcolepsy with cataplexy: Secondary | ICD-10-CM

## 2014-06-10 DIAGNOSIS — F515 Nightmare disorder: Secondary | ICD-10-CM

## 2014-06-10 NOTE — Telephone Encounter (Signed)
Patient states she thinks her medication needs to be adjusted--patient is starting to feel sleepy during the day--please call.

## 2014-06-11 NOTE — Telephone Encounter (Signed)
Spoke with patient and she said that Nuvigul-250mg  not getting her thru the day,sleepiness during the day

## 2014-06-12 MED ORDER — ARMODAFINIL 250 MG PO TABS: ORAL_TABLET | ORAL | Status: DC

## 2014-06-12 NOTE — Telephone Encounter (Signed)
We do have sometimes insurance problems when prescribing higher doses of Nuvigil, but I will go ahead and make her dose 1 tab in AM and 1/2 tab at lunch time.  She has a lot of medications listed that cause sleepiness, and will need to work with the prescribing physicians for these medications : hydromorphone, geodon, phenergan ., neurontin  CD

## 2014-06-12 NOTE — Telephone Encounter (Signed)
Shared Dr Dohmeier's message with patient concerning dosage change and other medications that may cause sleepiness, patient verbalized understanding and said that she had already discussed with primary and they have made adjustments.d

## 2014-06-23 ENCOUNTER — Telehealth: Payer: Self-pay | Admitting: Neurology

## 2014-06-23 NOTE — Telephone Encounter (Signed)
An updated Rx was sent to the pharmacy on 07/24.  (See previous note from 07/22).  I called the pharmacy.  Spoke with Jeannene Patella.  She said ins will not pay for refill until tomorrow because the patient got her old Rx filled on 07/17, and has to use that Rx before ins pays again.  They will fill Rx tomorrow.  I called the patient back.  Got no answer.  Left message.

## 2014-06-23 NOTE — Telephone Encounter (Signed)
Patient stated need new dosage Rx of Armodafinil (NUVIGIL) 250 MG tablet called into Pharmacy.  Please call anytime and advise, if not [place] good [place]) leave message on vm.  Thanks

## 2014-09-09 ENCOUNTER — Encounter: Payer: Self-pay | Admitting: Neurology

## 2014-09-09 ENCOUNTER — Ambulatory Visit (INDEPENDENT_AMBULATORY_CARE_PROVIDER_SITE_OTHER): Payer: 59 | Admitting: Neurology

## 2014-09-09 VITALS — BP 118/70 | HR 78 | Temp 97.4°F | Resp 12 | Ht 62.5 in | Wt 157.0 lb

## 2014-09-09 DIAGNOSIS — G47411 Narcolepsy with cataplexy: Secondary | ICD-10-CM

## 2014-09-09 MED ORDER — SODIUM OXYBATE 500 MG/ML PO SOLN: ORAL | Status: DC
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
## 2014-09-09 NOTE — Patient Instructions (Addendum)
Sodium Oxybate oral solution What is this medicine? SODIUM OXYBATE (SOE dee um OX i bate) is used to treat excessive sleepiness and cataplexy in patients with narcolepsy. Cataplexy causes a sudden muscle weakness due to a strong emotional response. This medicine is not available in retail pharmacies. Your doctor will enroll you in a program that will provide the drug to you. This medicine may be used for other purposes; ask your health care provider or pharmacist if you have questions. COMMON BRAND NAME(S): Xyrem What should I tell my health care provider before I take this medicine? They need to know if you have any of these conditions: -depression, psychosis, or other mood disorders -heart disease or high blood pressure -if you frequently drink alcohol containing beverages -if you have a history of drug or alcohol abuse -liver disease -lung disease or difficulty breathing -seizures -succinic semialdehyde dehydrogenase deficiency -thoughts of suicide -an unusual or allergic reaction to sodium oxybate, other medicines, foods, dyes, or preservatives -pregnant or trying to get pregnant -breast-feeding How should I use this medicine? Take this medicine by mouth. Follow the directions on the prescription label. Take this medicine on an empty stomach, at least 30 minutes before or 2 hours after food. Do not take with food. Do not take your medicine more often than directed. A special MedGuide will be given to you by the pharmacist with each prescription and refill. Be sure to read this information carefully each time. Talk to your pediatrician regarding the use of this medicine in children. Special care may be needed. Overdosage: If you think you have taken too much of this medicine contact a poison control center or emergency room at once. NOTE: This medicine is only for you. Do not share this medicine with others. What if I miss a dose? Skip the missed dose. If it is almost time for your next  dose, take only that dose. Allow at least two and one-half hours between each nightly dose. Do not take double or extra doses. What may interact with this medicine? Do not take this medicine with any of the following medications: -alcohol -barbiturates, like phenobarbital -medicines commonly used for anxiety, sedation or insomnia This medicine may also interact with the following medications: -bupropion -divalproex sodium -dronabinol or marijuana -medicines for psychosis or severe mood disturbances -muscle relaxants -other stimulants, although these are commonly used with sodium oxybate -prescription pain medicines, including tramadol -valproate or valproic acid This list may not describe all possible interactions. Give your health care provider a list of all the medicines, herbs, non-prescription drugs, or dietary supplements you use. Also tell them if you smoke, drink alcohol, or use illegal drugs. Some items may interact with your medicine. What should I watch for while using this medicine? The use of this medicine requires careful supervision. Visit your doctor or health care professional for regular checks on your progress. Do not suddenly stop taking this medicine if you have been taking it for a long time. Withdrawal symptoms may occur. Your doctor or health care professional may need to slowly stop your doses. This medicine may affect your concentration or function. Let your doctor or health care professional know if you have increased sleepiness or confusion during the day. This medicine causes sleep very quickly. You should only take this drug at bedtime, while in bed. Do not drive a car, operate heavy machinery or perform any activities that require mental alertness for at least 6 hours after taking this drug. Use extreme care in any such   daily activities until you know how this medicine affects you. Because alcohol may interfere with this medicine and may cause serious side effects,  you must avoid alcohol-containing beverages while on this medicine. Do not take this medicine along with sleep medicines or other drugs with strong sedative effects, serious side effects may occur. This medicine can be dangerous in overdose. If you take more than prescribed or take it by accident, get emergency medical help right away. What side effects may I notice from receiving this medicine? Side effects that you should report to your doctor or health care professional as soon as possible: -allergic reactions like skin rash, itching or hives, swelling of the face, lips, or tongue -breathing problems -confusion -fast, irregular heartbeat -increased blood pressure, particularly if you already have high blood pressure -memory loss -seizures -sleepwalking -tremors or shaking movements -urinary incontinence Side effects that usually do not require medical attention (report to your doctor or health care professional if they continue or are bothersome): -dizziness -drowsiness -headache -increased urination -nausea, vomiting or stomach upset -unusual dreams This list may not describe all possible side effects. Call your doctor for medical advice about side effects. You may report side effects to FDA at 1-800-FDA-1088. Where should I keep my medicine? Keep out of reach of children. This medicine can be abused. Keep your medicine in a safe place to protect it from theft. Do not share this medicine with anyone. Selling or giving away this medicine is dangerous and against the law. Store at room temperature between 15 and 30 degrees C (59 and 86 degrees F). This medicine may cause accidental overdose and death if it is taken by other adults, children, or pets. Flush any unused medicine down the toilet to reduce the chance of harm. Do not use the medicine after the expiration date. NOTE: This sheet is a summary. It may not cover all possible information. If you have questions about this medicine,  talk to your doctor, pharmacist, or health care provider.  2015, Elsevier/Gold Standard. (2013-06-04 15:45:13)    WE NEED TO CHECK Lithium and Natrium levels monthly.  If you develop symptoms of worsening depression please contact us immediately. Xyrem has such a high salty on a nightly on content that she may wake up Thursday or was a feeling of a dry mouth. These are place a glass of water next to her bed and also advised her husband that the medication may make you unresponsive for 2-3 hours. He would have to take the first doors in bed in preparation for 2-3 hours off it with sleep. He then takes a second doors. The central pharmacy will contact you regarding Xyrem and placed you on a titration Shaquille. In the meantime he will still have hypersomnia and it will take about 3-4 weeks to a feel the effects of the medication. He may need to you was medication such as Nuvigil parallel to the increasing dose of Xyrem. I have decided not to use Adderall because of your history of bipolar depression and Aderall is potentially a danger to you, as it can push you into a manic phase.

## 2014-09-10 LAB — COMPREHENSIVE METABOLIC PANEL
ALT: 12 IU/L (ref 0–32)
AST: 13 IU/L (ref 0–40)
Albumin/Globulin Ratio: 2.2 (ref 1.1–2.5)
Albumin: 4.7 g/dL (ref 3.5–5.5)
Alkaline Phosphatase: 103 IU/L (ref 39–117)
BUN/Creatinine Ratio: 6 — ABNORMAL LOW (ref 8–20)
BUN: 5 mg/dL — ABNORMAL LOW (ref 6–20)
CO2: 22 mmol/L (ref 18–29)
Calcium: 9.3 mg/dL (ref 8.7–10.2)
Chloride: 99 mmol/L (ref 97–108)
Creatinine, Ser: 0.78 mg/dL (ref 0.57–1.00)
GFR calc Af Amer: 114 mL/min/{1.73_m2} (ref >59)
GFR calc non Af Amer: 99 mL/min/{1.73_m2} (ref >59)
Globulin, Total: 2.1 g/dL (ref 1.5–4.5)
Glucose: 82 mg/dL (ref 65–99)
Potassium: 4 mmol/L (ref 3.5–5.2)
Sodium: 140 mmol/L (ref 134–144)
Total Bilirubin: 0.2 mg/dL (ref 0.0–1.2)
Total Protein: 6.8 g/dL (ref 6.0–8.5)

## 2014-09-10 LAB — LITHIUM LEVEL: Lithium Lvl: 0.4 mmol/L — ABNORMAL LOW (ref 0.6–1.4)

## 2014-09-15 ENCOUNTER — Telehealth: Payer: Self-pay | Admitting: Neurology

## 2014-09-15 NOTE — Telephone Encounter (Signed)
Patient checking status of Rx Sodium Oxybate (XYREM) 500 MG/ML SOLN.  Please call anytime and may leave detailed message on voicemail.

## 2014-09-15 NOTE — Telephone Encounter (Signed)
I called back, got no answer.  Left message.  Xyrem will contact patient once all info has been processed, and verified.

## 2014-09-17 NOTE — Progress Notes (Signed)
Quick Note:  I called pt and relayed the results of labs. She had seen her psychiatrist and they increased lithium and will repeat level in 2 wks per pt. ______

## 2014-09-22 ENCOUNTER — Telehealth: Payer: Self-pay | Admitting: Neurology

## 2014-09-22 NOTE — Telephone Encounter (Signed)
[facility] called saying the patient has allergy induced asthma.  They need to verify the provider is okay with them dispensing Xyrem even though she has this medical condition.  Please advise.  Thank you.

## 2014-09-22 NOTE — Telephone Encounter (Signed)
Yes, I am aware and will prescribe this medication. CD

## 2014-09-22 NOTE — Telephone Encounter (Signed)
Christy from [facility] calling to get information about drug interaction, they are about to ship out patient's Xyrem medication but Alyse Low just wants to make sure that we are aware she has allergy induced asthma, states that patient really wants this medication as soon as possible, so please call.

## 2014-09-22 NOTE — Telephone Encounter (Signed)
I called back.  Spoke with Elmyra Ricks.  Relayed Dr Dohmeier's message.  They will proceed with order and call us back if anything further is needed.

## 2014-10-07 ENCOUNTER — Ambulatory Visit (INDEPENDENT_AMBULATORY_CARE_PROVIDER_SITE_OTHER): Payer: 59 | Admitting: Neurology

## 2014-10-07 ENCOUNTER — Encounter: Payer: Self-pay | Admitting: Neurology

## 2014-10-07 VITALS — BP 116/80 | HR 89 | Temp 98.4°F | Resp 12 | Ht 61.0 in | Wt 152.0 lb

## 2014-10-07 DIAGNOSIS — Z79899 Other long term (current) drug therapy: Secondary | ICD-10-CM

## 2014-10-07 DIAGNOSIS — G47411 Narcolepsy with cataplexy: Secondary | ICD-10-CM

## 2014-10-07 DIAGNOSIS — Z5181 Encounter for therapeutic drug level monitoring: Secondary | ICD-10-CM

## 2014-10-07 DIAGNOSIS — F3110 Bipolar disorder, current episode manic without psychotic features, unspecified: Secondary | ICD-10-CM

## 2014-10-07 MED ORDER — SODIUM OXYBATE 500 MG/ML PO SOLN: ORAL | Status: DC

## 2014-10-07 NOTE — Progress Notes (Signed)
[facility]  Provider:  Larey Seat, M D  Referring Provider: Josetta Huddle, MD Primary Care Physician:  Debra Screws, MD  Insomnia, pain and Bipolar disorder :   HPI:  Debra Nelson is a 33 y.o. female and seen here for a RV after PSG and MSLT, positive  HLA testing .  This patient was originally seen in referral from Dr. Inda Nelson , for a sleep medicine evaluation:  Interval history : 09-09-14  We followed with an HLA-DQR 1 test which returned positive for narcolepsy. She responds to nuvigil, but not completely;  Debra Nelson still feels hypersomnia for much of the afternoon of her regular days- she endorses fatigue severity score of 57 points today and 8 Epworth sleepiness score of 13. Modafinil it helpful but is not entirely couldn't falling sleepiness. In addition it may cause some difficulties for her to sleep at night. She still has very vivid dreams and anxiety.  She reports now cataplexy, a progression form before, her knees " wobble' when she gets angry or upset. Her sleep paralysis continues. She has weaned off of all narcotic pain medications and feels not severly depressed. She even tolerated the stress of the recent move into a new home without decompensation.  She still takes lithium which can compete with sodium- the levels would have to be checked regularly with her psychiatrist.  She is scared of using XYREM and its associated price ticket.  We discussed the necessary precautions, the medication would come to the door, she needs to take it in bed and not before . She has to anticipate being unaware of her surroundings after taking the medication. No shift work.    10-07-14 :  Debra Nelson reports that she has advanced female on Xyrem to a dose of 3 g twice at night and has been doing well. She has noticed a decrease in fatigue and a significant decrease in sleepiness and divorcing the Epworth sleepiness scale at 10 and the fatigue  severity score at 40 points.  In addition, she seems to sleep deeper and more sound. Sleep is much easier initiated on Xyrem and she seems to sleep through the night better. She is concerned about the high salt content which she had trouble tolerating the  taste , and not by cardiovascular side effects or fluid retention. She has not yet reached the optimal dose and we meet today to make sure her Lithium levels are not too low due to the sodium intake.  Her dosing was changed to lithanoid bid 100 .  Her anxiety is stable, her depression is considered controlled.  Debra Nelson has experienced a cataplectic attack recently during an emotional charged argument with her husband . Her knees did buckel for 2-3 minutes , but there was no change in awareness. This was during the first 14 days on XYREM.    Las visit and history : CD  Debra Nelson is here today for a revisit to discuss the results of her recent sleep study and blood tests. The patient had endorsed the Epworth sleepiness score at 21/24 points when she underwent a polysomnogram.  This revealed an AHI of 0.0 and an RDI of 0.2 oxygen at nadir was 87%-They were no PLM arousals. The average heart rate was regular. The patient's MSLT documented in the sleep latency of only 5 minutes and three naps in four possible nap times . In one of the naps she was unable to fall asleep at all.  There was no REM sleep onset and this was attributed to the medications the patient was on.  The patient has also has reported sleep paralysis attacks this morning again , frequent  awakenings from nocturnal sleep- insomnia . Her sleep still is fragmented.  She reports vivid dreams,  sometimes yelling or slashing. He also reports that she has a strong sense of smell and taste coinciding with the extent of her dream.  She has noted some myoclonic jerks as well as aching muscles.   I hope this doesn't interfere with her psychiatric management.  Debra Nelson would like me to take  over her headache care, but I have to decline. I have ruled out a sleep disorder at the origin of her headaches.      History   Social History  . Marital Status: Married    Spouse Name: [name]    Number of Children: 4  . Years of Education: College   Occupational History  .  Other   Social History Main Topics  . Smoking status: Never Smoker   . Smokeless tobacco: Never Used  . Alcohol Use: No  . Drug Use: No  . Sexual Activity: Not on file   Other Topics Concern  . Not on file   Social History Narrative   Patient is married ([name]) and lives at home with her family.   Patient has 4 children (3 of her own and 1 step son).   Patient has a college education.   Patient drinks 3-4 caffeine drinks daily.   Patient is right-handed.    Family History  Problem Relation Age of Onset  . Cancer Paternal Grandfather     Past Medical History  Diagnosis Date  . Pain in limb   . ADD (attention deficit disorder)   . Chronic anxiety   . Chronic insomnia   . Seasonal allergic rhinitis   . DJD (degenerative joint disease)     Severe of the right knee-Dr Creighton-ortho and Dr Gean Birchwood, PA-preferred pain managment  . GERD (gastroesophageal reflux disease)   . Asthma     episodic  . Dysthymic disorder   . RLS (restless legs syndrome)   . Bipolar disorder with depression     and PTSD  . HA (headache)   . Deviated septum     No past surgical history on file.  Current Outpatient Prescriptions  Medication Sig Dispense Refill  . albuterol (PROVENTIL HFA;VENTOLIN HFA) 108 (90 BASE) MCG/ACT inhaler Inhale 2 puffs into the lungs every 6 (six) hours as needed for wheezing or shortness of breath.    . Armodafinil (NUVIGIL) 250 MG tablet 1 tab in AM and 1/2 tab at lunch time po. 45 tablet 5  . eletriptan (RELPAX) 40 MG tablet Take 40 mg by mouth as needed for migraine or headache. One tablet by mouth at onset of headache. May repeat in 2 hours if headache persists or recurs.     Marland Kitchen EPIPEN 2-PAK 0.3 MG/0.3ML SOAJ injection as needed.    . fexofenadine (ALLEGRA) 180 MG tablet Take 180 mg by mouth daily.    Marland Kitchen gabapentin (NEURONTIN) 300 MG capsule Take 400 mg by mouth. One capsule in am and early evening. 2 capsules at bedtime    . ibuprofen (ADVIL,MOTRIN) 200 MG tablet Take 200 mg by mouth 3 (three) times daily.    Marland Kitchen lamoTRIgine (LAMICTAL) 100 MG tablet Take 100 mg by mouth 2 (two) times daily.    . metoprolol succinate (TOPROL-XL) 25 MG 24 hr  tablet Take 50 mg by mouth daily.     . montelukast (SINGULAIR) 10 MG tablet Take 10 mg by mouth at bedtime.    . Multiple Vitamin (MULTIVITAMIN) tablet Take 1 tablet by mouth daily.    Marland Kitchen OVER THE COUNTER MEDICATION Biotin, vitamin E, Calcium D, cranberry, vitamin c    . pantoprazole (PROTONIX) 40 MG tablet 1 tablet 2 (two) times daily.    . promethazine (PHENERGAN) 25 MG tablet Take 25 mg by mouth every 6 (six) hours as needed for nausea or vomiting.    . Sodium Oxybate (XYREM) 500 MG/ML SOLN Starter titration,  central pharmacy. 270 mL 3  . UNKNOWN TO PATIENT Take 1 tablet by mouth 2 (two) times daily. She did not know name (lithinoid?)    . ziprasidone (GEODON) 40 MG capsule Take 40 mg by mouth. 1 tab am and 2 tabs a bedtime    . LITHIUM CARBONATE PO Take 300 mg by mouth at bedtime.      No current facility-administered medications for this visit.    Allergies as of 10/07/2014 - Review Complete 10/07/2014  Allergen Reaction Noted  . Klonopin [clonazepam] Anaphylaxis and Nausea Only 12/19/2013  . Phenergan [promethazine hcl]  12/26/2011  . Topamax [topiramate] Nausea And Vomiting 12/19/2013    Vitals: BP 116/80 mmHg  Pulse 89  Temp(Src) 98.4 F (36.9 C) (Oral)  Resp 12  Ht $R'5\' 1"'Ss$  (1.549 m)  Wt 152 lb (68.947 kg)  BMI 28.74 kg/m2 Last Weight:  Wt Readings from Last 1 Encounters:  10/07/14 152 lb (68.947 kg)   Last Height:   Ht Readings from Last 1 Encounters:  10/07/14 $RemoveB'5\' 1"'EWoHAAGX$  (1.549 m)     Physical  exam:  General: The patient is awake, alert and appears not in acute distress. The patient is well groomed. Head: Normocephalic, atraumatic. Neck is supple. Mallampati 3  neck circumference:14  Cardiovascular:  Regular rate and rhythm , without  murmurs or carotid bruit, and without distended neck veins. Respiratory: Lungs are clear to auscultation. Skin:  Without evidence of edema, or rash Trunk: BMI is elevated.  Neurologic exam : The patient is awake and alert, oriented to place and time.  Memory subjective described as impaired, but she is less depressed and more conversational than in her intitial consult.  Speech is fluent- no dysarthria, dysphonia or aphasia. Mood and affect are engaging, cooperative and pleasant.   Cranial nerves: Pupils are equal and briskly reactive to light. Hearing to finger rub intact. Facial sensation intact to fine touch.  Facial motor strength is symmetric and tongue and uvula move midline.  Motor exam:  Normal tone and normal muscle bulk and symmetric normal strength in all extremities. There is crepitation over the lateral right and the medial left knee.   Coordination: Finger-to-nose maneuver without evidence of ataxia, dysmetria or tremor.  Gait and station: Patient walks without assistive device .  Strength within normal limits. Stance is stable and normal. She turns with 4 Steps, which are unfragmented.  Deep tendon reflexes: in the  upper and lower extremities are symmetric and intact.2/2 /  Babinski maneuver response is downgoing.   Assessment:  After physical and neurologic examination, review of laboratory studies, imaging, neurophysiology testing and pre-existing records, assessment is  1) narcolepsy by HLA , and with associated cataplexy .   Lithium may interfere with XYREM. Patient needs treatment for hypersomnia, and for cataplexy. XYREM now at 3 gram bid.   Nuvigil was not  effective . High degree  of fatigue remained and cataplexy.    3) cyclic sleep disorder related to bipolar disorder- currently controlled. .   Plan:  Treatment plan and additional workup : Lithium and LFT checks. CMET . XYREM  Increase now to 3.75 gram bid.          NP, [name]. Leslie o'Neil. Dr. Mertha Finders

## 2014-10-07 NOTE — Patient Instructions (Signed)
Sodium Oxybate oral solution What is this medicine? SODIUM OXYBATE (SOE dee um OX i bate) is used to treat excessive sleepiness and cataplexy in patients with narcolepsy. Cataplexy causes a sudden muscle weakness due to a strong emotional response. This medicine is not available in retail pharmacies. Your doctor will enroll you in a program that will provide the drug to you. This medicine may be used for other purposes; ask your health care provider or pharmacist if you have questions. COMMON BRAND NAME(S): Xyrem What should I tell my health care provider before I take this medicine? They need to know if you have any of these conditions: -depression, psychosis, or other mood disorders -heart disease or high blood pressure -if you frequently drink alcohol containing beverages -if you have a history of drug or alcohol abuse -liver disease -lung disease or difficulty breathing -seizures -succinic semialdehyde dehydrogenase deficiency -thoughts of suicide -an unusual or allergic reaction to sodium oxybate, other medicines, foods, dyes, or preservatives -pregnant or trying to get pregnant -breast-feeding How should I use this medicine? Take this medicine by mouth. Follow the directions on the prescription label. Take this medicine on an empty stomach, at least 30 minutes before or 2 hours after food. Do not take with food. Do not take your medicine more often than directed. A special MedGuide will be given to you by the pharmacist with each prescription and refill. Be sure to read this information carefully each time. Talk to your pediatrician regarding the use of this medicine in children. Special care may be needed. Overdosage: If you think you have taken too much of this medicine contact a poison control center or emergency room at once. NOTE: This medicine is only for you. Do not share this medicine with others. What if I miss a dose? Skip the missed dose. If it is almost time for your next  dose, take only that dose. Allow at least two and one-half hours between each nightly dose. Do not take double or extra doses. What may interact with this medicine? Do not take this medicine with any of the following medications: -alcohol -barbiturates, like phenobarbital -medicines commonly used for anxiety, sedation or insomnia This medicine may also interact with the following medications: -bupropion -divalproex sodium -dronabinol or marijuana -medicines for psychosis or severe mood disturbances -muscle relaxants -other stimulants, although these are commonly used with sodium oxybate -prescription pain medicines, including tramadol -valproate or valproic acid This list may not describe all possible interactions. Give your health care provider a list of all the medicines, herbs, non-prescription drugs, or dietary supplements you use. Also tell them if you smoke, drink alcohol, or use illegal drugs. Some items may interact with your medicine. What should I watch for while using this medicine? The use of this medicine requires careful supervision. Visit your doctor or health care professional for regular checks on your progress. Do not suddenly stop taking this medicine if you have been taking it for a long time. Withdrawal symptoms may occur. Your doctor or health care professional may need to slowly stop your doses. This medicine may affect your concentration or function. Let your doctor or health care professional know if you have increased sleepiness or confusion during the day. This medicine causes sleep very quickly. You should only take this drug at bedtime, while in bed. Do not drive a car, operate heavy machinery or perform any activities that require mental alertness for at least 6 hours after taking this drug. Use extreme care in any such   daily activities until you know how this medicine affects you. Because alcohol may interfere with this medicine and may cause serious side effects,  you must avoid alcohol-containing beverages while on this medicine. Do not take this medicine along with sleep medicines or other drugs with strong sedative effects, serious side effects may occur. This medicine can be dangerous in overdose. If you take more than prescribed or take it by accident, get emergency medical help right away. What side effects may I notice from receiving this medicine? Side effects that you should report to your doctor or health care professional as soon as possible: -allergic reactions like skin rash, itching or hives, swelling of the face, lips, or tongue -breathing problems -confusion -fast, irregular heartbeat -increased blood pressure, particularly if you already have high blood pressure -memory loss -seizures -sleepwalking -tremors or shaking movements -urinary incontinence Side effects that usually do not require medical attention (report to your doctor or health care professional if they continue or are bothersome): -dizziness -drowsiness -headache -increased urination -nausea, vomiting or stomach upset -unusual dreams This list may not describe all possible side effects. Call your doctor for medical advice about side effects. You may report side effects to FDA at 1-800-FDA-1088. Where should I keep my medicine? Keep out of reach of children. This medicine can be abused. Keep your medicine in a safe place to protect it from theft. Do not share this medicine with anyone. Selling or giving away this medicine is dangerous and against the law. Store at room temperature between 15 and 30 degrees C (59 and 86 degrees F). This medicine may cause accidental overdose and death if it is taken by other adults, children, or pets. Flush any unused medicine down the toilet to reduce the chance of harm. Do not use the medicine after the expiration date. NOTE: This sheet is a summary. It may not cover all possible information. If you have questions about this medicine,  talk to your doctor, pharmacist, or health care provider.  2015, Elsevier/Gold Standard. (2013-06-04 15:45:13)

## 2014-10-08 LAB — COMPREHENSIVE METABOLIC PANEL
ALT: 10 IU/L (ref 0–32)
AST: 18 IU/L (ref 0–40)
Albumin/Globulin Ratio: 2.5 (ref 1.1–2.5)
Albumin: 5 g/dL (ref 3.5–5.5)
Alkaline Phosphatase: 120 IU/L — ABNORMAL HIGH (ref 39–117)
BUN/Creatinine Ratio: 5 — ABNORMAL LOW (ref 8–20)
BUN: 4 mg/dL — ABNORMAL LOW (ref 6–20)
CO2: 30 mmol/L — ABNORMAL HIGH (ref 18–29)
Calcium: 9.6 mg/dL (ref 8.7–10.2)
Chloride: 96 mmol/L — ABNORMAL LOW (ref 97–108)
Creatinine, Ser: 0.79 mg/dL (ref 0.57–1.00)
GFR calc Af Amer: 112 mL/min/{1.73_m2} (ref >59)
GFR calc non Af Amer: 97 mL/min/{1.73_m2} (ref >59)
Globulin, Total: 2 g/dL (ref 1.5–4.5)
Glucose: 93 mg/dL (ref 65–99)
Potassium: 3.5 mmol/L (ref 3.5–5.2)
Sodium: 141 mmol/L (ref 134–144)
Total Bilirubin: 0.4 mg/dL (ref 0.0–1.2)
Total Protein: 7 g/dL (ref 6.0–8.5)

## 2014-10-08 LAB — LITHIUM LEVEL: Lithium Lvl: 0.7 mmol/L (ref 0.6–1.4)

## 2014-10-20 ENCOUNTER — Telehealth: Payer: Self-pay | Admitting: *Deleted

## 2014-10-20 NOTE — Telephone Encounter (Signed)
Spoke with patient and informed her of normal labs, patient verbalized understanding and had no further questions or concerns.

## 2014-10-20 NOTE — Telephone Encounter (Signed)
-----   Message from Larey Seat, MD sent at 10/14/2014  4:43 PM EST ----- Normal lithium level- please share with your prescriber. CD

## 2014-12-15 ENCOUNTER — Ambulatory Visit (INDEPENDENT_AMBULATORY_CARE_PROVIDER_SITE_OTHER): Payer: 59

## 2014-12-15 VITALS — BP 116/76 | HR 94 | Resp 12

## 2014-12-15 DIAGNOSIS — M19071 Primary osteoarthritis, right ankle and foot: Secondary | ICD-10-CM

## 2014-12-15 DIAGNOSIS — M2021 Hallux rigidus, right foot: Secondary | ICD-10-CM

## 2014-12-15 DIAGNOSIS — R52 Pain, unspecified: Secondary | ICD-10-CM

## 2014-12-15 DIAGNOSIS — M7751 Other enthesopathy of right foot: Secondary | ICD-10-CM

## 2014-12-15 MED ORDER — MELOXICAM 15 MG PO TABS: 15.0000 mg | ORAL_TABLET | Freq: Every day | ORAL | Status: DC

## 2014-12-15 NOTE — Progress Notes (Signed)
   Subjective:    Patient ID: Debra Nelson, female    DOB: 05-27-1981, 34 y.o.   MRN: [id number]  HPI PT STATED RT FOOT IS BEEN PAINFUL OFF AND ON FOR 2 MONTHS. THE FOOT IS GETTING WORSE AND GET AGGRAVATED BY PRESSURE. PT WENT TO URGENT CARE AND PUT HER POST SURGICAL BOOT AND DR. SANDERS PRESCRIBE MELOXICAM AND IT HELP SOME.    Review of Systems  Constitutional: Positive for appetite change and unexpected weight change.  Eyes: Positive for visual disturbance.  Neurological: Positive for headaches.  Psychiatric/Behavioral: The patient is nervous/anxious.   All other systems reviewed and are negative.      Objective:   Physical Exam 34 year old white female well-developed well-nourished oriented 3 presents this time with painful first MTP joint right foot. There is pain on dorsiflexion or plantarflexion there is limited dorsiflexion down to about 40 maximum on right foot compared to the left. X-rays reveal asymmetric joint space narrowing first MTP area L avulsed the first metatarsal with a long first met being noted sesamoid position to otherwise there is some dorsal spurring palpated clinically on the first MTP joint as well. Patient been taking ibuprofen with some improvement there is no history of injury or trauma noted X [place] better and her, boots than in her tennis shoes or athletic shoes. Seated promontory changes noted on weightbearing both feet sub-first metatarsal. Pedal pulses are palpable epicritic and proprioceptive sensations intact and symmetric normal plantar response DTRs not elicited neurologically skin color pigment and hair growth are normal orthopedic exam rectus foot type noted with long first met all possible first met with functional hallux limitus/hallux rigidus deformity right foot and capsulitis       Assessment & Plan:  Assessment hallux limitus/rigidus deformity and capsulitis first MTP area right long first met with all of August. Plan at this time  patient is placed meloxicam 15 minutes once daily switch from ibuprofen. Patient is also advised ice maintaining good stable shoe. Fascial strapping applied to the right foot may benefit from orthoses recheck in 2 weeks for follow-up if no improvement with strapping surgical intervention may be an option in the future.  Harriet Masson DPM

## 2014-12-15 NOTE — Patient Instructions (Signed)
Hallux Rigidus Hallux rigidus is a condition involving pain and a loss of motion of the first (big) toe. The pain gets worse with lifting up (extension) of the toe. This is usually due to arthritic bony bumps (spurring) of the joint at the base of the big toe.  SYMPTOMS   Pain, with lifting up of the toe.  Tenderness over the joint where the big toe meets the foot.  Redness, swelling, and warmth over the top of the base of the big toe (sometimes).  Foot pain, stiffness, and limping. CAUSES  Hallux rigidus is caused by arthritis of the joint where the big toe meets the foot. The arthritis creates a bone spur that pinches the soft tissues when the toe is extended. RISK INCREASES WITH:  Tight shoes with a narrow toe box.  Family history of foot problems.  Gout and rheumatoid and psoriatic arthritis.  History of previous toe injury, including "turf toe."  Long first toe, flat feet, and other big toe bony bumps.  Arthritis of the big toe. PREVENTION   Wear wide-toed shoes that fit well.  Tape the big toe to reduce motion and to prevent pinching of the tissues between the bone.  Maintain physical fitness:  Foot and ankle flexibility.  Muscle strength and endurance. PROGNOSIS  This condition can usually be managed with proper treatment. However, surgery is typically required to prevent the problem from recurring.  RELATED COMPLICATIONS  Injury to other areas of the foot or ankle, caused by abnormal walking in an attempt to avoid the pain felt when walking normally. TREATMENT Treatment first involves stopping the activities that aggravate your symptoms. Ice and medicine can be used to reduce the pain and inflammation. Modifications to shoes may help reduce pain, including wearing stiff-soled shoes, shoes with a wide toe box, inserting a padded donut to relieve pressure on top of the joint, or wearing an arch support. Corticosteroid injections may be given to reduce inflammation. If  nonsurgical treatment is unsuccessful, surgery may be needed. Surgical options include removing the arthritic bony spur, cutting a bone in the foot to change the arc of motion (allowing the toe to extend more), or fusion of the joint (eliminating all motion in the joint at the base of the big toe).  MEDICATION   If pain medicine is needed, nonsteroidal anti-inflammatory medicines (aspirin and ibuprofen), or other minor pain relievers (acetaminophen), are often advised.  Do not take pain medicine for 7 days before surgery.  Prescription pain relievers are usually prescribed only after surgery. Use only as directed and only as much as you need.  Ointments for arthritis, applied to the skin, may give some relief.  Injections of corticosteroids may be given to reduce inflammation. HEAT AND COLD  Cold treatment (icing) relieves pain and reduces inflammation. Cold treatment should be applied for 10 to 15 minutes every 2 to 3 hours, and immediately after activity that aggravates your symptoms. Use ice packs or an ice massage.  Heat treatment may be used before performing the stretching and strengthening activities prescribed by your caregiver, physical therapist, or athletic trainer. Use a heat pack or a warm water soak. SEEK MEDICAL CARE IF:   Symptoms get worse or do not improve in 2 weeks, despite treatment.  After surgery you develop fever, increasing pain, redness, swelling, drainage of fluids, bleeding, or increasing warmth.  New, unexplained symptoms develop. (Drugs used in treatment may produce side effects.) Document Released: 11/06/2005 Document Revised: 03/23/2014 Document Reviewed: 02/18/2009 ExitCare Patient Information  2015 [employer]. This information is not intended to replace advice given to you by your health care provider. Make sure you discuss any questions you have with your health care provider.     ICE INSTRUCTIONS  Apply ice or cold pack to the affected area at  least 3 times a day for 10-15 minutes each time.  You should also use ice after prolonged activity or vigorous exercise.  Do not apply ice longer than 20 minutes at one time.  Always keep a cloth between your skin and the ice pack to prevent burns.  Being consistent and following these instructions will help control your symptoms.  We suggest you purchase a gel ice pack because they are reusable and do bit leak.  Some of them are designed to wrap around the area.  Use the method that works best for you.  Here are some other suggestions for icing.   Use a frozen bag of peas or corn-inexpensive and molds well to your body, usually stays frozen for 10 to 20 minutes.  Wet a towel with cold water and squeeze out the excess until it's damp.  Place in a bag in the freezer for 20 minutes. Then remove and use.

## 2014-12-23 ENCOUNTER — Other Ambulatory Visit: Payer: Self-pay | Admitting: Neurology

## 2014-12-23 DIAGNOSIS — F515 Nightmare disorder: Secondary | ICD-10-CM

## 2014-12-23 DIAGNOSIS — G47411 Narcolepsy with cataplexy: Secondary | ICD-10-CM

## 2014-12-23 MED ORDER — ARMODAFINIL 250 MG PO TABS: ORAL_TABLET | ORAL | Status: DC

## 2014-12-23 NOTE — Telephone Encounter (Signed)
Colletta Maryland with [facility] is calling to get a refill on Rx Nuvigil for patient. Colletta Maryland is having problems faxing the request. Thank you.

## 2014-12-23 NOTE — Telephone Encounter (Signed)
I spoke with patient who said she is still taking this med.  Request entered, forwarded to provider for approval.

## 2014-12-24 NOTE — Telephone Encounter (Signed)
Rx signed and faxed.

## 2014-12-29 ENCOUNTER — Ambulatory Visit (INDEPENDENT_AMBULATORY_CARE_PROVIDER_SITE_OTHER): Payer: 59

## 2014-12-29 VITALS — BP 121/78 | HR 80 | Resp 12

## 2014-12-29 DIAGNOSIS — M2021 Hallux rigidus, right foot: Secondary | ICD-10-CM

## 2014-12-29 DIAGNOSIS — M7751 Other enthesopathy of right foot: Secondary | ICD-10-CM

## 2014-12-29 DIAGNOSIS — M722 Plantar fascial fibromatosis: Secondary | ICD-10-CM

## 2014-12-29 DIAGNOSIS — R52 Pain, unspecified: Secondary | ICD-10-CM

## 2014-12-29 NOTE — Progress Notes (Signed)
   Subjective:    Patient ID: Debra Nelson, female    DOB: 07-01-1981, 34 y.o.   MRN: [id number]  HPI  ''RT FOOT IS DOING GOOD WHEN IT WAS WRAP WITH THE TAPE BUT NOW IS PAINFUL.''  Review of Systems no new findings or systemic changes noted the foot felt much better well the taping was in place     Objective:   Physical Exam Neurovascular status is intact pedal pulses are palpable patient continues have capsulitis first MTP area with functional limitus rigidus deformity of the right great toe joint having some symptoms of fascial pain on palpation from the mid arch following calcaneal tubercle on exam today. Taping felt better well in place since the taping, symptoms have recurred. Mechanical exam otherwise unremarkable rectus foot type ankle metatarsal subtalar joint motions normal mentation of motion first MTP joint right.       Assessment & Plan:  Based on improvement with fascial strapping and use of NSAIDs patient is a strong candidate for orthoses skin orthotic scan carried at this time patient will use flex. Pay for orthoses of systems noncovered by her insurance plan. We'll make arrangements for payment if need to. At this time maintain orthoses also fascial strapping reapplied after today's orthotic scan carried out. Biomechanical support also indicated the future may be a candidate for surgical intervention possibly implant arthroplasty versus cheilectomy    Harriet Masson DPM

## 2015-01-05 ENCOUNTER — Telehealth: Payer: Self-pay | Admitting: *Deleted

## 2015-01-05 NOTE — Telephone Encounter (Signed)
Patient thinks that Xyem  4.5 daily at night is a little to much for her. Patient is sleeping good at night but it is hard for her to get up in the morning and she is very groggy. Patient would like to know what should she do. Please advise.

## 2015-01-05 NOTE — Telephone Encounter (Signed)
Please go just a tat bit down to 4 gram at night, twice. CD

## 2015-01-06 NOTE — Telephone Encounter (Signed)
I called back and pt asked about syringe and not having it say 4.0.  I relayed and instructed her on the syringe of what 4.0 would be.  She verbalized understanding.  She relayed that 3.75grams worked for her but will try the 4.0 gram dosing and call us back as needed.

## 2015-01-06 NOTE — Telephone Encounter (Signed)
I called and relayed to pt that she may go down to xyrem 4 gram at night (twice) and see how she does.  She will call back as needed. She verbalized understanding of instructions.

## 2015-01-11 ENCOUNTER — Telehealth: Payer: Self-pay | Admitting: Neurology

## 2015-01-11 NOTE — Telephone Encounter (Signed)
Patient's spouse, [name], requesting if Dr. Brett Fairy could suggest a new Psychiatrist that would be very familiar with Rx Sodium Oxybate (XYREM) 500 MG/ML SOLN.  Please call and advise.

## 2015-01-12 ENCOUNTER — Telehealth: Payer: Self-pay | Admitting: Neurology

## 2015-01-12 NOTE — Telephone Encounter (Signed)
Patient is calling about Rx Xyrem which was recently decreased to 4mg  twice nightly. Patient feels this dosage is still over sedating. Please call.

## 2015-01-13 ENCOUNTER — Other Ambulatory Visit: Payer: Self-pay

## 2015-01-13 NOTE — Telephone Encounter (Signed)
I spoke to the patient by phone. She reports that it is the most difficult for her to wake up for the second nighttime dose and not to wake up in the morning when her alarm rings. We decided to reduce the first dose at night from 4 g to 3.5 and also reduce the second in the same manner this may be easier for her to get them to the nighttime dose if it still if she still feels too groggy the could use 3 g as the first dose of the night and I would like for her to try 3 g as a second as well. Overall she is very happy with Xyrem and affect, she reports it has been a life changing medication for her she is socially engaged she has had one cataplectic event since been on Xyrem and she is able to go to her children's functions and function in the household as well as at work.

## 2015-01-15 NOTE — Telephone Encounter (Signed)
Please call _ DR.  Norma Fredrickson, Dr. Sheralyn Boatman are names of psychiatrists familiar with [facility].  Also, [name],   CD

## 2015-01-18 NOTE — Telephone Encounter (Signed)
LMVM for husband, of the names of psychiatry recommended by Dr Beacher May. He is to call back as needed.

## 2015-01-19 ENCOUNTER — Ambulatory Visit: Payer: 59

## 2015-01-19 ENCOUNTER — Ambulatory Visit (INDEPENDENT_AMBULATORY_CARE_PROVIDER_SITE_OTHER): Payer: 59

## 2015-01-19 ENCOUNTER — Other Ambulatory Visit: Payer: Self-pay | Admitting: *Deleted

## 2015-01-19 VITALS — BP 122/86 | HR 72 | Resp 12

## 2015-01-19 DIAGNOSIS — M722 Plantar fascial fibromatosis: Secondary | ICD-10-CM | POA: Diagnosis not present

## 2015-01-19 DIAGNOSIS — M2021 Hallux rigidus, right foot: Secondary | ICD-10-CM

## 2015-01-19 DIAGNOSIS — R52 Pain, unspecified: Secondary | ICD-10-CM

## 2015-01-19 DIAGNOSIS — M7751 Other enthesopathy of right foot: Secondary | ICD-10-CM

## 2015-01-19 MED ORDER — MELOXICAM 15 MG PO TABS: 15.0000 mg | ORAL_TABLET | Freq: Every day | ORAL | Status: DC

## 2015-01-19 NOTE — Progress Notes (Signed)
   Subjective:    Patient ID: Debra Nelson, female    DOB: 10/16/1981, 34 y.o.   MRN: [id number]  HPI  PUO AND GIVEN INSTRUCTION.  Review of Systems no new findings or systemic changes noted     Objective:   Physical Exam  Neurovascular status is intact pedal pulses are palpable at this time 1 pair of orthotics are dispensed a fit and contour well full contact of the foot and arch. No other new findings or difficulties or identified    Assessment & Plan:  Assessment this time is plantar fasciitis/heel spur syndrome as well as capsulitis of the foot and biomechanical instability patient is a candidate for functional orthoses which are dispensed at this time maintain orthoses in the future may be candidate for more aggressive or surgical options for arthritic great toe joint however we'll address those needs in the future. At this time patient is advised appropriate shoe fit wear written instructions for orthotic with breaking her given recheck in one or 2 months as needed for adjustments  Harriet Masson DPM

## 2015-01-19 NOTE — Patient Instructions (Signed)

## 2015-01-19 NOTE — Telephone Encounter (Signed)
Refill request for Meloxicam.    Dr. Noralyn Pick the refill plus 2 additional refills.

## 2015-01-20 NOTE — Telephone Encounter (Signed)
I have spoken to the patient and actually close this note. This may be a separate note that bypassed me previously. The issue has been cleared.

## 2015-02-23 ENCOUNTER — Ambulatory Visit: Payer: 59

## 2015-02-24 DIAGNOSIS — M722 Plantar fascial fibromatosis: Secondary | ICD-10-CM

## 2015-03-19 DIAGNOSIS — Z0289 Encounter for other administrative examinations: Secondary | ICD-10-CM

## 2015-03-31 ENCOUNTER — Other Ambulatory Visit: Payer: Self-pay | Admitting: Obstetrics & Gynecology

## 2015-03-31 ENCOUNTER — Other Ambulatory Visit (HOSPITAL_COMMUNITY)
Admission: RE | Admit: 2015-03-31 | Discharge: 2015-03-31 | Disposition: A | Discharge status: Home / Self Care | Payer: 59 | Source: Ambulatory Visit | Attending: Obstetrics & Gynecology | Admitting: Obstetrics & Gynecology

## 2015-03-31 DIAGNOSIS — Z1151 Encounter for screening for human papillomavirus (HPV): Secondary | ICD-10-CM | POA: Diagnosis present

## 2015-03-31 DIAGNOSIS — Z01419 Encounter for gynecological examination (general) (routine) without abnormal findings: Secondary | ICD-10-CM | POA: Diagnosis not present

## 2015-03-31 LAB — HM PAP SMEAR: HM Pap smear: NEGATIVE

## 2015-04-01 LAB — CYTOLOGY - PAP

## 2015-04-07 ENCOUNTER — Ambulatory Visit (INDEPENDENT_AMBULATORY_CARE_PROVIDER_SITE_OTHER): Payer: 59 | Admitting: Neurology

## 2015-04-07 ENCOUNTER — Encounter: Payer: Self-pay | Admitting: Neurology

## 2015-04-07 VITALS — BP 120/78 | HR 90 | Resp 18 | Ht 59.84 in | Wt 137.0 lb

## 2015-04-07 DIAGNOSIS — Z5181 Encounter for therapeutic drug level monitoring: Secondary | ICD-10-CM

## 2015-04-07 DIAGNOSIS — G47411 Narcolepsy with cataplexy: Secondary | ICD-10-CM | POA: Diagnosis not present

## 2015-04-07 NOTE — Progress Notes (Signed)
[facility]  Provider:  Larey Seat, M D  Referring Provider: Josetta Huddle, MD Primary Care Physician:  Henrine Screws, MD  Insomnia, pain and Bipolar disorder :   HPI:  Debra Nelson is a 34 y.o. female and seen here for a RV after PSG and MSLT, positive  HLA testing .  This patient was originally seen in referral from Dr. Inda Merlin , for a sleep medicine evaluation:  Interval history : 09-09-14  We followed with an HLA-DQR 1 test which returned positive for narcolepsy. She responds to nuvigil, but not completely;  Debra Nelson still feels hypersomnia for much of the afternoon of her regular days- she endorses fatigue severity score of 57 points today and 8 Epworth sleepiness score of 13. Modafinil it helpful but is not entirely couldn't falling sleepiness. In addition it may cause some difficulties for her to sleep at night. She still has very vivid dreams and anxiety.  She reports now cataplexy, a progression form before, her knees " wobble' when she gets angry or upset. Her sleep paralysis continues. She has weaned off of all narcotic pain medications and feels not severly depressed. She even tolerated the stress of the recent move into a new home without decompensation.  She still takes lithium which can compete with sodium- the levels would have to be checked regularly with her psychiatrist.  She is scared of using XYREM and its associated price ticket.  We discussed the necessary precautions, the medication would come to the door, she needs to take it in bed and not before . She has to anticipate being unaware of her surroundings after taking the medication. No shift work.    10-07-14 :  Debra. Beske reports that she has advanced female on Xyrem to a dose of 3 g twice at night and has been doing well. She has noticed a decrease in fatigue and a significant decrease in sleepiness and divorcing the Epworth sleepiness scale at 10 and the fatigue  severity score at 40 points.  In addition, she seems to sleep deeper and more sound. Sleep is much easier initiated on Xyrem and she seems to sleep through the night better. She is concerned about the high salt content which she had trouble tolerating the  taste , and not by cardiovascular side effects or fluid retention. She has not yet reached the optimal dose and we meet today to make sure her Lithium levels are not too low due to the sodium intake.  Her dosing was changed to lithanoid bid 100 .  Her anxiety is stable, her depression is considered controlled.  Debra Nelson has experienced a cataplectic attack recently during an emotional charged argument with her husband . Her knees did buckel for 2-3 minutes , but there was no change in awareness. This was during the first 14 days on XYREM. Lithium and LFT checks. CMET . XYREM  Increase now to 3.75 gram bid.    Las visit and history : CD  Debra Nelson is here today for a revisit to discuss the results of her recent sleep study and blood tests. The patient had endorsed the Epworth sleepiness score at 21/24 points when she underwent a polysomnogram.  This revealed an AHI of 0.0 and an RDI of 0.2 oxygen at nadir was 87%-They were no PLM arousals. The average heart rate was regular. The patient's MSLT documented in the sleep latency of only 5 minutes and three naps in four possible nap times .  In one of the naps she was unable to fall asleep at all.  There was no REM sleep onset and this was attributed to the medications the patient was on.  The patient has also has reported sleep paralysis attacks this morning again , frequent  awakenings from nocturnal sleep- insomnia . Her sleep still is fragmented.  She reports vivid dreams,  sometimes yelling or slashing. He also reports that she has a strong sense of smell and taste coinciding with the extent of her dream.  She has noted some myoclonic jerks as well as aching muscles.   I hope this doesn't  interfere with her psychiatric management.  Debra Nelson would like me to take over her headache care, but I have to decline. I have ruled out a sleep disorder at the origin of her headaches.  04-07-15 the patient is here for an revisit scheduled revisit Patient reports that Xyrem overall has helped her to reduce the degree of sleepiness but she still will need the occasional nap and she will take maneuver Nuvigil as well. The couple had to change the dinnertime to as early as 6 PM to make it possible to takes Xyrem 2 hours after dinner. She takes a lower Xyrem dose at first and then the regular Xyrem dose second. Her Epworth sleepiness score today is endorsed at 10 points which is a radical reduction from prior to Xyrem use and her fatigue severity score remains around 50 which is still very high. She states that if she misses her second dose she will wake up earlier than she usually does, and she was already following 5 hours behind the first dose.  He discussed today to use her smart phone to reduce daytime naps to less than 30 minutes I suggest 15 or 20 to allow her refreshing sleep without the duct from her night sleep quality. I also would like her to still takes a second Xyrem dose if she is more than 6 hours away from driving or operating machinery the next morning. The and I wonder if he could help her by making the Nuvigil daily a routine so she does not have to try to anticipate her needs during the day.     History   Social History  . Marital Status: Married    Spouse Name: [name]  . Number of Children: 4  . Years of Education: College   Occupational History  .  Other   Social History Main Topics  . Smoking status: Never Smoker   . Smokeless tobacco: Never Used  . Alcohol Use: No  . Drug Use: No  . Sexual Activity: Not on file   Other Topics Concern  . Not on file   Social History Narrative   Patient is married ([name]) and lives at home with her family.   Patient has 4  children (3 of her own and 1 step son).   Patient has a college education.   Patient drinks 3-4 caffeine drinks daily.   Patient is right-handed.    Family History  Problem Relation Age of Onset  . Cancer Paternal Grandfather     Past Medical History  Diagnosis Date  . Pain in limb   . ADD (attention deficit disorder)   . Chronic anxiety   . Chronic insomnia   . Seasonal allergic rhinitis   . DJD (degenerative joint disease)     Severe of the right knee-Dr Creighton-ortho and Dr Gean Birchwood, PA-preferred pain managment  . GERD (gastroesophageal reflux disease)   .  Asthma     episodic  . Dysthymic disorder   . RLS (restless legs syndrome)   . Bipolar disorder with depression     and PTSD  . HA (headache)   . Deviated septum     No past surgical history on file.  Current Outpatient Prescriptions  Medication Sig Dispense Refill  . albuterol (PROVENTIL HFA;VENTOLIN HFA) 108 (90 BASE) MCG/ACT inhaler Inhale 2 puffs into the lungs every 6 (six) hours as needed for wheezing or shortness of breath.    . Armodafinil (NUVIGIL) 250 MG tablet 1 tab in AM and 1/2 tab at lunch time po. 45 tablet 5  . eletriptan (RELPAX) 40 MG tablet Take 40 mg by mouth as needed for migraine or headache. One tablet by mouth at onset of headache. May repeat in 2 hours if headache persists or recurs.    Marland Kitchen EPIPEN 2-PAK 0.3 MG/0.3ML SOAJ injection as needed.    . fexofenadine (ALLEGRA) 180 MG tablet Take 180 mg by mouth daily.    Marland Kitchen gabapentin (NEURONTIN) 300 MG capsule Take 400 mg by mouth. One capsule in am and early evening. 2 capsules at bedtime    . ibuprofen (ADVIL,MOTRIN) 200 MG tablet Take 200 mg by mouth 3 (three) times daily.    Marland Kitchen lamoTRIgine (LAMICTAL) 100 MG tablet Take 100 mg by mouth 2 (two) times daily.    Marland Kitchen LITHIUM CARBONATE PO Take 300 mg by mouth at bedtime.     . meloxicam (MOBIC) 15 MG tablet Take 1 tablet (15 mg total) by mouth daily. 30 tablet 2  . metoprolol succinate (TOPROL-XL) 25  MG 24 hr tablet Take 50 mg by mouth daily.     . montelukast (SINGULAIR) 10 MG tablet Take 10 mg by mouth at bedtime.    . Multiple Vitamin (MULTIVITAMIN) tablet Take 1 tablet by mouth daily.    Marland Kitchen OVER THE COUNTER MEDICATION Biotin, vitamin E, Calcium D, cranberry, vitamin c    . pantoprazole (PROTONIX) 40 MG tablet 1 tablet 2 (two) times daily.    . promethazine (PHENERGAN) 25 MG tablet Take 25 mg by mouth every 6 (six) hours as needed for nausea or vomiting.    . Sodium Oxybate (XYREM) 500 MG/ML SOLN Starter titration,  central pharmacy. 270 mL 3  . ziprasidone (GEODON) 40 MG capsule Take 40 mg by mouth. 1 tab am and 2 tabs a bedtime     No current facility-administered medications for this visit.    Allergies as of 04/07/2015 - Review Complete 04/07/2015  Allergen Reaction Noted  . Klonopin [clonazepam] Anaphylaxis and Nausea Only 12/19/2013  . Phenergan [promethazine hcl]  12/26/2011  . Topamax [topiramate] Nausea And Vomiting 12/19/2013    Vitals: BP 120/78 mmHg  Pulse 90  Resp 18  Ht 4' 11.84" (1.52 m)  Wt 137 lb (62.143 kg)  BMI 26.90 kg/m2 Last Weight:  Wt Readings from Last 1 Encounters:  04/07/15 137 lb (62.143 kg)   Last Height:   Ht Readings from Last 1 Encounters:  04/07/15 4' 11.84" (1.52 m)     Physical exam:  General: The patient is awake, alert and appears not in acute distress. The patient is well groomed. Head: Normocephalic, atraumatic. Neck is supple. Mallampati 3  neck circumference:14  Cardiovascular:  Regular rate and rhythm , without  murmurs or carotid bruit, and without distended neck veins. Respiratory: Lungs are clear to auscultation. Skin:  Without evidence of edema, or rash Trunk: BMI is elevated.  Neurologic exam : The  patient is awake and alert, oriented to place and time.  Memory subjective described as impaired, but she is less depressed and more conversational than in her intitial consult.  Speech is fluent- no dysarthria, dysphonia  or aphasia. Mood and affect are engaging, cooperative and pleasant. She is a little worried about oversleeping for the second dose.   Cranial nerves: Pupils are equal and briskly reactive to light. Hearing to finger rub intact. Facial sensation intact to fine touch.  Facial motor strength is symmetric and tongue and uvula move midline.  Motor exam:  Normal tone and normal muscle bulk and symmetric normal strength in all extremities. There is crepitation over the lateral right and the medial left knee.  Coordination: Finger-to-nose maneuver without evidence of ataxia, dysmetria or tremor. Gait and station: Patient walks without assistive device .  Strength within normal limits. Stance is stable and normal. She turns with 4 Steps, which are unfragmented.  Deep tendon reflexes: in the  upper and lower extremities are symmetric and intact.2/2 /  Babinski maneuver response is downgoing.   Assessment:  After physical and neurologic examination, review of laboratory studies, imaging, neurophysiology testing and pre-existing records, assessment is  1) narcolepsy by HLA , and with associated cataplexy . She had sleep walking episodes when she took only the lower first dose of XYREM, not the second dose.    Lithium may interfere with XYREM. Patient needs treatment for hypersomnia, and for cataplexy. XYREM now at  3 gram first dose, 4.5  gram second dose.   Nuvigil alone was not  effective .High degree of fatigue remained a, but her  Cataplexy is now controlled. Marland Kitchen   3) cyclic sleep disorder related to bipolar disorder- currently controlled.  She is asked to fill a depression questionnaire with every visit.    She was turned down for disability, her knee pain improved under XYREM.  No longer cataplexy - one incident since being  on meds.  Weight loss 30 pounds was welcomed by the patient.   She would be able to work form home.      Plan:  Treatment plan and additional workup :   Continue 3  gram at night and 4.5  gram for the second dose.  Continue with depression treatment. Daily Nuvigil 250 mg.   Labs today          NP, [name]. Leslie o'Neil. Dr. Mertha Finders

## 2015-04-08 ENCOUNTER — Other Ambulatory Visit: Payer: Self-pay | Admitting: Neurology

## 2015-04-08 DIAGNOSIS — Z79899 Other long term (current) drug therapy: Secondary | ICD-10-CM

## 2015-04-08 DIAGNOSIS — G47411 Narcolepsy with cataplexy: Secondary | ICD-10-CM

## 2015-04-08 DIAGNOSIS — F3110 Bipolar disorder, current episode manic without psychotic features, unspecified: Secondary | ICD-10-CM

## 2015-04-08 LAB — COMPREHENSIVE METABOLIC PANEL
ALT: 13 IU/L (ref 0–32)
AST: 14 IU/L (ref 0–40)
Albumin/Globulin Ratio: 2 (ref 1.1–2.5)
Albumin: 4.4 g/dL (ref 3.5–5.5)
Alkaline Phosphatase: 82 IU/L (ref 39–117)
BUN/Creatinine Ratio: 3 — ABNORMAL LOW (ref 8–20)
BUN: 2 mg/dL — ABNORMAL LOW (ref 6–20)
Bilirubin Total: 0.3 mg/dL (ref 0.0–1.2)
CO2: 22 mmol/L (ref 18–29)
Calcium: 9.6 mg/dL (ref 8.7–10.2)
Chloride: 103 mmol/L (ref 97–108)
Creatinine, Ser: 0.74 mg/dL (ref 0.57–1.00)
GFR calc Af Amer: 121 mL/min/{1.73_m2} (ref >59)
GFR calc non Af Amer: 105 mL/min/{1.73_m2} (ref >59)
Globulin, Total: 2.2 g/dL (ref 1.5–4.5)
Glucose: 130 mg/dL — ABNORMAL HIGH (ref 65–99)
Potassium: 4.2 mmol/L (ref 3.5–5.2)
Sodium: 143 mmol/L (ref 134–144)
Total Protein: 6.6 g/dL (ref 6.0–8.5)

## 2015-04-08 LAB — CBC WITH DIFFERENTIAL/PLATELET
Basophils Absolute: 0 10*3/uL (ref 0.0–0.2)
Basos: 0 %
EOS (ABSOLUTE): 0 10*3/uL (ref 0.0–0.4)
Eos: 1 %
Hematocrit: 37.8 % (ref 34.0–46.6)
Hemoglobin: 12.2 g/dL (ref 11.1–15.9)
Immature Grans (Abs): 0 10*3/uL (ref 0.0–0.1)
Immature Granulocytes: 0 %
Lymphocytes Absolute: 1.5 10*3/uL (ref 0.7–3.1)
Lymphs: 22 %
MCH: 30.1 pg (ref 26.6–33.0)
MCHC: 32.3 g/dL (ref 31.5–35.7)
MCV: 93 fL (ref 79–97)
Monocytes Absolute: 0.3 10*3/uL (ref 0.1–0.9)
Monocytes: 5 %
Neutrophils Absolute: 4.9 10*3/uL (ref 1.4–7.0)
Neutrophils: 72 %
Platelets: 370 10*3/uL (ref 150–379)
RBC: 4.05 x10E6/uL (ref 3.77–5.28)
RDW: 13 % (ref 12.3–15.4)
WBC: 6.9 10*3/uL (ref 3.4–10.8)

## 2015-04-08 NOTE — Telephone Encounter (Signed)
Request entered, forwarded to provider for approval.  

## 2015-04-08 NOTE — Telephone Encounter (Signed)
Debra Nelson from [facility] [phone]) called and requested a new prescription for Rx.  Sodium Oxybate (XYREM) 500 MG/ML SOLN for the pt. Please call and advise.

## 2015-04-09 MED ORDER — SODIUM OXYBATE 500 MG/ML PO SOLN
ORAL | Status: DC
Start: 2015-04-09 — End: 2015-07-08

## 2015-04-09 NOTE — Telephone Encounter (Signed)
Rx has been faxed.

## 2015-04-21 ENCOUNTER — Telehealth: Payer: Self-pay

## 2015-04-21 NOTE — Telephone Encounter (Signed)
Patient was informed of High glucose, she states she had a soft drink before appointment, she was also informed of CD/BUN result of hydration.  She has no questions at this time.

## 2015-04-29 ENCOUNTER — Telehealth: Payer: Self-pay | Admitting: Neurology

## 2015-04-29 MED ORDER — MODAFINIL 200 MG PO TABS: 200.0000 mg | ORAL_TABLET | Freq: Every day | ORAL | Status: DC

## 2015-04-29 NOTE — Telephone Encounter (Signed)
Patient called stating Insurance will not cover Armodafinil (NUVIGIL) 250 MG tablet any more. The insurance suggested she use Provigil. Please call and advise. Patient can be reached at [phone].

## 2015-04-29 NOTE — Telephone Encounter (Signed)
We can   write for PROVIGIL 200 mg and if this fails she can get NUVIGIL back after 30 days. CD

## 2015-04-29 NOTE — Telephone Encounter (Signed)
I called the patient back to advise.  Got no answer.  Left message.

## 2015-05-10 ENCOUNTER — Telehealth: Payer: Self-pay

## 2015-05-10 DIAGNOSIS — G47411 Narcolepsy with cataplexy: Secondary | ICD-10-CM

## 2015-05-10 DIAGNOSIS — F515 Nightmare disorder: Secondary | ICD-10-CM

## 2015-05-10 MED ORDER — ARMODAFINIL 250 MG PO TABS: ORAL_TABLET | ORAL | Status: DC

## 2015-05-10 NOTE — Telephone Encounter (Signed)
Message forwarded to provider for review.

## 2015-05-10 NOTE — Telephone Encounter (Signed)
Patient indicates Provigil was not beneficial and she would like to resume Nuvigil instead.  Please advise.  Thank you.

## 2015-05-10 NOTE — Telephone Encounter (Addendum)
Patient called stating on the  modafinil (PROVIGIL) 200 MG tablet she is so sleepy by noon she is non-functioning. She states it is not helping. Please call and advise. Patient can be reached at [phone].

## 2015-05-10 NOTE — Telephone Encounter (Signed)
Patient failed generic modafinil, will return to NUVIGIL>

## 2015-05-12 NOTE — Telephone Encounter (Signed)
Patient called stating she has not had a return phone call. Please call and advise. Patient can be reached at [phone].

## 2015-05-12 NOTE — Telephone Encounter (Signed)
It appears the provider did change patient back to Nuvigil.  I called and spoke with the patient.  She is aware, and agreeable to this.  I called the pharmacy who verified they do have the Rx, and it went through ins without any problems.

## 2015-06-01 ENCOUNTER — Telehealth: Payer: Self-pay | Admitting: Neurology

## 2015-06-01 NOTE — Telephone Encounter (Signed)
Margaretha Sheffield called from the pharmacy and states that there is a drug interaction between two of the patients medications and she needs drug interaction approval from the Dr. Brett Fairy. Please call and advise.

## 2015-06-02 NOTE — Telephone Encounter (Signed)
Spoke with Caryl Pina at [facility]. She wanted to make Dr. Brett Fairy aware that the pt is taking latuda and there is an interaction between xyrem and latuda that may cause CNS depression. I informed Dr. Brett Fairy and she is aware of this interaction that may cause CNS depression and verified that it was ok for pt to continue xyrem.  I called Caryl Pina back at Xyrem and informed her of this. She verbalized understanding

## 2015-06-14 ENCOUNTER — Telehealth: Payer: Self-pay | Admitting: Neurology

## 2015-06-14 NOTE — Telephone Encounter (Signed)
Pt called and needs rx Armodafinil (NUVIGIL) 250 MG tablet, authorization for the medication sent to fax [phone] [facility]. May also call [phone]. She also needs the Rx sent to [place], [place] phone # [phone]. May call pt back at [phone], [phone].

## 2015-06-14 NOTE — Telephone Encounter (Signed)
I called ins.  Spoke with [employer].  Provided all clinical info.  She has forwarded the request to the PA dept for review.  Ref # [id number].

## 2015-06-14 NOTE — Telephone Encounter (Signed)
Ins has responded stating they have changed their policy, and both Nuvigil and Armodafinil are excluded from the plan, and no [name] is available, no exceptions will be made.  The only listed alternative is Modafinil.  Would you like to change Rx?  Please advise.  Thank you.

## 2015-06-14 NOTE — Telephone Encounter (Signed)
Patient's husband called stating pt is out of medication and was told by rep at [facility] if it was marked urgent the process could be speeded up to 48hrs. Please call and advise. He can be reached at [phone].

## 2015-06-16 NOTE — Telephone Encounter (Signed)
I called the patient back.  Got no answer.  Left message relaying providers note, and response from ins.

## 2015-06-16 NOTE — Telephone Encounter (Signed)
Patient can be reached at [phone] or [phone]. Thank you.

## 2015-06-16 NOTE — Telephone Encounter (Signed)
Patient is calling back and states if she does start taking modafinil (PROVIGIL) 200 MG tablet again the dosage would need to be increased. Please call to [facility] in [place], [place] at [phone]. Please call patient and advise. She can be reached at

## 2015-06-16 NOTE — Telephone Encounter (Signed)
Happy to write for Modafinil.

## 2015-06-21 ENCOUNTER — Other Ambulatory Visit: Payer: Self-pay

## 2015-06-21 ENCOUNTER — Telehealth: Payer: Self-pay

## 2015-06-21 MED ORDER — MODAFINIL 200 MG PO TABS: 300.0000 mg | ORAL_TABLET | Freq: Every day | ORAL | Status: DC

## 2015-06-21 NOTE — Telephone Encounter (Signed)
Patient would like to know if the dose can be increased on Modafinil.  Please advise.  Thank you.

## 2015-06-21 NOTE — Telephone Encounter (Signed)
We can increase nuvigil to a max of 1.5 tab a day, 300 mg. CD

## 2015-06-21 NOTE — Telephone Encounter (Signed)
I called the patient back.  Got no answer.  Left message. 

## 2015-06-21 NOTE — Telephone Encounter (Signed)
Rx has been signed and faxed  

## 2015-07-08 ENCOUNTER — Encounter: Payer: Self-pay | Admitting: Neurology

## 2015-07-08 ENCOUNTER — Ambulatory Visit (INDEPENDENT_AMBULATORY_CARE_PROVIDER_SITE_OTHER): Payer: 59 | Admitting: Neurology

## 2015-07-08 VITALS — BP 102/72 | HR 78 | Resp 20 | Ht 61.0 in | Wt 137.0 lb

## 2015-07-08 DIAGNOSIS — Z79899 Other long term (current) drug therapy: Secondary | ICD-10-CM | POA: Diagnosis not present

## 2015-07-08 DIAGNOSIS — G47411 Narcolepsy with cataplexy: Secondary | ICD-10-CM

## 2015-07-08 DIAGNOSIS — F3110 Bipolar disorder, current episode manic without psychotic features, unspecified: Secondary | ICD-10-CM

## 2015-07-08 MED ORDER — SODIUM OXYBATE 500 MG/ML PO SOLN: ORAL | Status: DC

## 2015-07-08 MED ORDER — ARMODAFINIL 250 MG PO TABS: 250.0000 mg | ORAL_TABLET | Freq: Every day | ORAL | Status: DC

## 2015-07-08 NOTE — Progress Notes (Signed)
[facility]   Provider:  Larey Seat, M D  Referring Provider: Josetta Huddle, MD Primary Care Physician:  Henrine Screws, MD  Chief Complaint  Patient presents with  . Follow-up    narcolepsy, rm 11, alone    HPI:  Debra Nelson is a 34 y.o. female , seen here as a  revisit  from Dr. Inda Merlin for treatment of narcolepsy.    Chief complaint according to patient :" Recurrent hypersomnia "after the patient's medications were no longer approved by insurance.   She was changed from Nuvigil to Provigil and has not responded well.  The patient is an HLA positive narcoleptic with co-morbidities of  Bipolar disorder. This limits the use of stimulants in her treatment.  She was changed from [place] to [place] by Psychiatry, and her anxiety level was lower than it had been in a long time. The patient felt that her psychiatric condition has been very well controlled with the new LATUDA  Medication- her anxiety level decreased she was much less irritable. Shortly after, the medication Nuvigil was not longer covered and she is now sleepier and less attentive.  Sleep studies : see media. The patient has been on Xyrem, and lost weight in the process.   Social history: married, 2 children with ADD. One daughter from her first marriage, and a stepson form her husbands site.   Review of Systems: Out of a complete 14 system review, the patient complains of only the following symptoms, and all other reviewed systems are negative.   Epworth score 10 , Fatigue severity score 53  , depression score PHQ 9 : 1 point loss for losing pleasure, feeling depressed, feeling bad, trouble concentrating. 2 points for feeling tired loss of energy moving or thinking slowly, 3 points for poor appetite or overeating. Total 11 points.     Social History   Social History  . Marital Status: Married    Spouse Name: [name]  . Number of Children: 4  . Years of Education: College   Occupational  History  .  Other   Social History Main Topics  . Smoking status: Never Smoker   . Smokeless tobacco: Never Used  . Alcohol Use: No  . Drug Use: No  . Sexual Activity: Not on file   Other Topics Concern  . Not on file   Social History Narrative   Patient is married ([name]) and lives at home with her family.   Patient has 4 children (3 of her own and 1 step son).   Patient has a college education.   Patient drinks 3-4 caffeine drinks daily.   Patient is right-handed.    Family History  Problem Relation Age of Onset  . Cancer Paternal Grandfather     Past Medical History  Diagnosis Date  . Pain in limb   . ADD (attention deficit disorder)   . Chronic anxiety   . Chronic insomnia   . Seasonal allergic rhinitis   . DJD (degenerative joint disease)     Severe of the right knee-Dr Creighton-ortho and Dr Gean Birchwood, PA-preferred pain managment  . GERD (gastroesophageal reflux disease)   . Asthma     episodic  . Dysthymic disorder   . RLS (restless legs syndrome)   . Bipolar disorder with depression     and PTSD  . HA (headache)   . Deviated septum     History reviewed. No pertinent past surgical history.  Current Outpatient Prescriptions  Medication Sig Dispense Refill  .  albuterol (PROVENTIL HFA;VENTOLIN HFA) 108 (90 BASE) MCG/ACT inhaler Inhale 2 puffs into the lungs every 6 (six) hours as needed for wheezing or shortness of breath.    . eletriptan (RELPAX) 40 MG tablet Take 40 mg by mouth as needed for migraine or headache. One tablet by mouth at onset of headache. May repeat in 2 hours if headache persists or recurs.    Marland Kitchen EPIPEN 2-PAK 0.3 MG/0.3ML SOAJ injection as needed.    . fexofenadine (ALLEGRA) 180 MG tablet Take 180 mg by mouth daily.    Marland Kitchen gabapentin (NEURONTIN) 300 MG capsule Take 400 mg by mouth. One capsule in am and early evening. 2 capsules at bedtime    . ibuprofen (ADVIL,MOTRIN) 200 MG tablet Take 200 mg by mouth 3 (three) times daily.    Marland Kitchen  lamoTRIgine (LAMICTAL) 100 MG tablet Take 100 mg by mouth 2 (two) times daily.    Marland Kitchen LATUDA 40 MG TABS tablet     . lithium carbonate (LITHOBID) 300 MG CR tablet     . LITHIUM CARBONATE PO Take 300 mg by mouth at bedtime.     . meloxicam (MOBIC) 15 MG tablet Take 1 tablet (15 mg total) by mouth daily. 30 tablet 2  . metoprolol succinate (TOPROL-XL) 25 MG 24 hr tablet Take 50 mg by mouth daily.     . modafinil (PROVIGIL) 200 MG tablet Take 1.5 tablets (300 mg total) by mouth daily. 135 tablet 1  . montelukast (SINGULAIR) 10 MG tablet Take 10 mg by mouth at bedtime.    . Multiple Vitamin (MULTIVITAMIN) tablet Take 1 tablet by mouth daily.    Marland Kitchen OVER THE COUNTER MEDICATION Biotin, vitamin E, Calcium D, cranberry, vitamin c    . pantoprazole (PROTONIX) 40 MG tablet 1 tablet 2 (two) times daily.    . promethazine (PHENERGAN) 25 MG tablet Take 25 mg by mouth every 6 (six) hours as needed for nausea or vomiting.    . Sodium Oxybate (XYREM) 500 MG/ML SOLN 3 grams po for first dose and 4.5 grams for second dose nightly as directed 270 mL 5  . ziprasidone (GEODON) 40 MG capsule Take 40 mg by mouth. 1 tab am and 2 tabs a bedtime     No current facility-administered medications for this visit.    Allergies as of 07/08/2015 - Review Complete 07/08/2015  Allergen Reaction Noted  . Klonopin [clonazepam] Anaphylaxis and Nausea Only 12/19/2013  . Phenergan [promethazine hcl]  12/26/2011  . Topamax [topiramate] Nausea And Vomiting 12/19/2013    Vitals: BP 102/72 mmHg  Pulse 78  Resp 20  Ht _0  (1.549 m)  Wt 137 lb (62.143 kg)  BMI 25.90 kg/m2 Last Weight:  Wt Readings from Last 1 Encounters:  07/08/15 137 lb (62.143 kg)   RCV:ELFY mass index is 25.9 kg/(m^2).     Last Height:   Ht Readings from Last 1 Encounters:  07/08/15 _1  (1.549 m)    Physical exam:  General: The patient is awake, alert and appears not in acute distress. The patient is well groomed. Head: Normocephalic, atraumatic.  Neck is tense - paraspinal tenderness . Mallampati 2-3 , crowded lower  jaw,  neck circumference:13.75 inches  . Nasal airflow unrestricted , TMJ click not evident . Retrognathia is not seen.  Cardiovascular:  Regular rate and rhythm, without  murmurs or carotid bruit, and without distended neck veins. Respiratory: Lungs are clear to auscultation. Skin:  Without evidence of edema, or rash Trunk: BMI is normal .  The patient's posture is erect    Neurologic exam : The patient is awake and alert, oriented to place and time.   Memory subjective described as intact.   Attention span & concentration ability appears normal.  Speech is fluent, without dysarthria, dysphonia or aphasia.  Mood and affect are appropriate.  Cranial nerves: Pupils are equal and briskly reactive to light.  Funduscopic exam without  evidence of pallor or edema.  Extraocular movements  in vertical and horizontal planes intact and without nystagmus. Visual fields by finger perimetry are intact. Hearing to finger rub intact.  Facial sensation intact to fine touch. Facial motor strength is symmetric and tongue and uvula move midline. Shoulder shrug was symmetrical.   Motor exam: Normal tone, muscle bulk and symmetric strength in all extremities.  Sensory:  Fine touch, pinprick and vibration were tested in all extremities.  Proprioception tested in the upper extremities was normal.  Coordination: Rapid alternating movements in the fingers/hands was normal.  Finger-to-nose maneuver  normal without evidence of ataxia, dysmetria or tremor.  Gait and station: Patient walks without assistive device and is able unassisted to climb up to the exam table. Strength within normal limits.  Stance is stable and normal.  Toe and hell stand were tested .Tandem gait is unfragmented.  Turns with  Steps. Romberg testing is negative.  Deep tendon reflexes: in the  upper and lower extremities are symmetric and intact. Babinski maneuver  response is  downgoing.  The patient was advised of the nature of the diagnosed sleep disorder , the treatment options and risks for general a health and wellness arising from not treating the condition.  I spent more than 25 minutes of face to face time with the patient. Greater than 50% of time was spent in counseling and coordination of care. We have discussed the diagnosis and differential and I answered the patient's questions.     Assessment:  After physical and neurologic examination, review of laboratory studies,  Personal review of imaging studies, reports of other /same  Imaging studies ,   Results of polysomnography/ neurophysiology testing and pre-existing records as far as provided in visit.,  my assessment is:   1) Narcolepsy confirmed by HLA - XYREM and NUVIGIL controlled her hypersomnia and Latuda controlled her anxiety. With the change to Provigil, this success has been partially undone and the patient is again hypersomnic.     2) weight has drastically changed up and down, this is likely influenced by her bipolar disorder. Medication side effect influences this, too. Currently normal BMI.   3)No cataplexy on XYREM. Only one time does she recall that she get angry at her body get tingly but not truly weak. This is a common forme fruste of cataplexy.    Plan:  Treatment plan and additional workup :  The patient will remain on Xyrem and for her safety we will obtain liver function tests metabolic panel and CBC today. In addition I will write a letter to her insurance company explaining that she will need Nuvigil and that modafinil was not a sufficient substitute for the brand name drug. I hope this will get through. The patient seems to be not in the depths of her depression at this point her pH Q9 was endorsed at 11 points. Rv [facility] 3 month [facility].      Asencion Partridge Gregroy Dombkowski MD  07/08/2015   CC: Josetta Huddle, Md [address] [place],   [id number]

## 2015-07-08 NOTE — Patient Instructions (Signed)
Armodafinil tablets What is this medicine? ARMODAFINIL (ar moe DAF i nil) is used to treat excessive sleepiness caused by certain sleep disorders. This includes narcolepsy, sleep apnea, and shift work sleep disorder. This medicine may be used for other purposes; ask your health care provider or pharmacist if you have questions. COMMON BRAND NAME(S): Nuvigil What should I tell my health care provider before I take this medicine? They need to know if you have any of these conditions: -kidney disease -liver disease -an unusual or allergic reaction to armodafinil, modafinil, medicines, foods, dyes, or preservatives -pregnant or trying to get pregnant -breast-feeding How should I use this medicine? Take this medicine by mouth with a glass of water. Follow the directions on the prescription label. Take your doses at regular intervals. Do not take your medicine more often than directed. Do not stop taking except on your doctor's advice. A special MedGuide will be given to you by the pharmacist with each prescription and refill. Be sure to read this information carefully each time. Talk to your pediatrician regarding the use of this medicine in children. While this drug may be prescribed for children as young as 66 years of age for selected conditions, precautions do apply. Overdosage: If you think you have taken too much of this medicine contact a poison control center or emergency room at once. NOTE: This medicine is only for you. Do not share this medicine with others. What if I miss a dose? If you miss a dose, take it as soon as you can. If it is almost time for your next dose, take only that dose. Do not take double or extra doses. What may interact with this medicine? Do not take this medicine with any of the following medications: -amphetamine or dextroamphetamine -dexmethylphenidate or methylphenidate -MAOIs like Carbex, Eldepryl, Marplan, Nardil, and Parnate -pemoline -procarbazine This  medicine may also interact with the following medications: -antifungal medicines like itraconazole or ketoconazole -barbiturates, like phenobarbital -birth control pills or other hormone-containing birth control devices or implants -carbamazepine -cyclosporine -diazepam -medicines for depression, anxiety, or psychotic disturbances -phenytoin -propranolol -triazolam -warfarin This list may not describe all possible interactions. Give your health care provider a list of all the medicines, herbs, non-prescription drugs, or dietary supplements you use. Also tell them if you smoke, drink alcohol, or use illegal drugs. Some items may interact with your medicine. What should I watch for while using this medicine? Visit your doctor or health care professional for regular checks on your progress. The full effect of this medicine may not be seen right away. This medicine may affect your concentration, function, or may hide signs that you are tired. You may get dizzy. Do not drive, use machinery, or do anything that needs mental alertness until you know how this drug affects you. Alcohol can make you more dizzy and may interfere with your response to this medicine or your alertness. Avoid alcoholic drinks. Birth control pills may not work properly while you are taking this medicine. Talk to your doctor about using an extra method of birth control. It is unknown if the effects of this medicine will be increased by the use of caffeine. Caffeine is found in many foods, beverages, and medications. Ask your doctor if you should limit or change your intake of caffeine-containing products while on this medicine. What side effects may I notice from receiving this medicine? Side effects that you should report to your doctor or health care professional as soon as possible: -allergic reactions like skin  rash, itching or hives, swelling of the face, lips, or tongue -breathing problems -chest pain -fast, irregular  heartbeat -increased blood pressure, particularly if you have high blood pressure -mental problems -sore throat, fever, or chills -tremors -vomiting Side effects that usually do not require medical attention (report to your doctor or health care professional if they continue or are bothersome): -headache -nausea, diarrhea, or stomach upset -nervousness -trouble sleeping This list may not describe all possible side effects. Call your doctor for medical advice about side effects. You may report side effects to FDA at 1-800-FDA-1088. Where should I keep my medicine? Keep out of the reach of children. Store at room temperature between 20 and 25 degrees C (68 and 77 degrees F). Throw away any unused medicine after the expiration date. NOTE: This sheet is a summary. It may not cover all possible information. If you have questions about this medicine, talk to your doctor, pharmacist, or health care provider.  2015, Elsevier/Gold Standard. (2009-10-19 21:10:28)  

## 2015-07-09 LAB — COMPREHENSIVE METABOLIC PANEL
ALT: 11 IU/L (ref 0–32)
AST: 15 IU/L (ref 0–40)
Albumin/Globulin Ratio: 2.2 (ref 1.1–2.5)
Albumin: 4.6 g/dL (ref 3.5–5.5)
Alkaline Phosphatase: 85 IU/L (ref 39–117)
BUN/Creatinine Ratio: 7 — ABNORMAL LOW (ref 8–20)
BUN: 6 mg/dL (ref 6–20)
Bilirubin Total: 0.3 mg/dL (ref 0.0–1.2)
CO2: 27 mmol/L (ref 18–29)
Calcium: 10.1 mg/dL (ref 8.7–10.2)
Chloride: 102 mmol/L (ref 97–108)
Creatinine, Ser: 0.82 mg/dL (ref 0.57–1.00)
GFR calc Af Amer: 106 mL/min/{1.73_m2} (ref >59)
GFR calc non Af Amer: 92 mL/min/{1.73_m2} (ref >59)
Globulin, Total: 2.1 g/dL (ref 1.5–4.5)
Glucose: 96 mg/dL (ref 65–99)
Potassium: 5.7 mmol/L — ABNORMAL HIGH (ref 3.5–5.2)
Sodium: 143 mmol/L (ref 134–144)
Total Protein: 6.7 g/dL (ref 6.0–8.5)

## 2015-07-09 LAB — CBC WITH DIFFERENTIAL/PLATELET
Basophils Absolute: 0.1 10*3/uL (ref 0.0–0.2)
Basos: 1 %
EOS (ABSOLUTE): 0.1 10*3/uL (ref 0.0–0.4)
Eos: 2 %
Hematocrit: 39 % (ref 34.0–46.6)
Hemoglobin: 12.8 g/dL (ref 11.1–15.9)
Immature Grans (Abs): 0 10*3/uL (ref 0.0–0.1)
Immature Granulocytes: 0 %
Lymphocytes Absolute: 2.1 10*3/uL (ref 0.7–3.1)
Lymphs: 26 %
MCH: 31.4 pg (ref 26.6–33.0)
MCHC: 32.8 g/dL (ref 31.5–35.7)
MCV: 96 fL (ref 79–97)
Monocytes Absolute: 0.8 10*3/uL (ref 0.1–0.9)
Monocytes: 10 %
Neutrophils Absolute: 5 10*3/uL (ref 1.4–7.0)
Neutrophils: 61 %
Platelets: 400 10*3/uL — ABNORMAL HIGH (ref 150–379)
RBC: 4.07 x10E6/uL (ref 3.77–5.28)
RDW: 12.5 % (ref 12.3–15.4)
WBC: 8 10*3/uL (ref 3.4–10.8)

## 2015-07-12 ENCOUNTER — Telehealth: Payer: Self-pay

## 2015-07-12 NOTE — Telephone Encounter (Signed)
Spoke to pt. She reports that she saw her lab results already on mychart. She said that she will keep the elevated K + in mind, she may have her PCP check it again to make sure it comes down. She has no further questions or concerns at this time.

## 2015-07-12 NOTE — Telephone Encounter (Signed)
-----   Message from Larey Seat, MD sent at 07/09/2015 12:08 PM EDT ----- Elevated potassium , 5.7g. Can be dietary ( dry fruits, coconut water ) or you just took a K Cl supplement?  You have well hydrated . No abnormalities in kidney or liver!  Normal white and red blood cell count. Platelets are a bit elevated, not concerning. CD

## 2015-07-28 ENCOUNTER — Encounter: Payer: Self-pay | Admitting: Neurology

## 2015-07-30 NOTE — Telephone Encounter (Signed)
   Dear [name] , here as the latest response I got through our pharmacy technician, [name] .   Ins has responded stating they have changed their policy, and both Nuvigil and Armodafinil are excluded from the plan, and no [name] is available, no exceptions will be made. The only listed alternative is Modafinil. She may be able to contact [name] at [phone] to see if she qualifies for assistance with this med through their program. I can gladly call her if you'd like.   Sorry! Ins seems to be ever changing...    Please contact [name]  by above named number cares and we will work with you to get the medication through,   Sincerely,    [employer]. MD

## 2015-08-02 NOTE — Telephone Encounter (Signed)
We contacted ins again to provide additional info for possible appeal.  They stated, unfortunately, since this drug is excluded from the plan, "there is no coverage criteria to review or apply, and therefore an exception cannot be granted". Ref # [id number].  I called and spoke with the patient.  She expressed understanding and will contact [name] to try to get assistance with this drug.

## 2015-09-07 ENCOUNTER — Other Ambulatory Visit: Payer: Self-pay

## 2015-09-07 DIAGNOSIS — F3110 Bipolar disorder, current episode manic without psychotic features, unspecified: Secondary | ICD-10-CM

## 2015-09-07 DIAGNOSIS — Z79899 Other long term (current) drug therapy: Secondary | ICD-10-CM

## 2015-09-07 DIAGNOSIS — G47411 Narcolepsy with cataplexy: Secondary | ICD-10-CM

## 2015-09-07 MED ORDER — SODIUM OXYBATE 500 MG/ML PO SOLN: ORAL | Status: DC

## 2015-09-07 NOTE — Telephone Encounter (Signed)
Rx signed and faxed.

## 2015-10-19 ENCOUNTER — Telehealth: Payer: Self-pay | Admitting: Neurology

## 2015-10-19 ENCOUNTER — Ambulatory Visit (INDEPENDENT_AMBULATORY_CARE_PROVIDER_SITE_OTHER): Payer: 59 | Admitting: Neurology

## 2015-10-19 ENCOUNTER — Encounter: Payer: Self-pay | Admitting: Neurology

## 2015-10-19 VITALS — BP 118/90 | HR 78 | Resp 20 | Ht 61.0 in | Wt 144.0 lb

## 2015-10-19 DIAGNOSIS — Z5181 Encounter for therapeutic drug level monitoring: Secondary | ICD-10-CM | POA: Diagnosis not present

## 2015-10-19 DIAGNOSIS — G47411 Narcolepsy with cataplexy: Secondary | ICD-10-CM | POA: Insufficient documentation

## 2015-10-19 DIAGNOSIS — G43111 Migraine with aura, intractable, with status migrainosus: Secondary | ICD-10-CM | POA: Diagnosis not present

## 2015-10-19 DIAGNOSIS — G43109 Migraine with aura, not intractable, without status migrainosus: Secondary | ICD-10-CM | POA: Insufficient documentation

## 2015-10-19 MED ORDER — ZONISAMIDE 50 MG PO CAPS: ORAL_CAPSULE | ORAL | Status: DC

## 2015-10-19 NOTE — Addendum Note (Signed)
Addended by: Larey Seat on: 10/19/2015 11:03 AM   Modules accepted: Orders

## 2015-10-19 NOTE — Telephone Encounter (Signed)
Debra Nelson with [facility] is calling to let us know that the Rx Xyrem for patient might interact with the Rx Zonisamide that the patient is currently taking. [facility] needs approval/authorization from the doctor before they will ship more Xyrem.  Please call @ [phone] opt 3,4  OR fax to [phone].  Thanks!

## 2015-10-19 NOTE — Telephone Encounter (Signed)
Zonisamide does not suppress nocturnal breathing reflexes, it could delay a prolong the mid heparinization of Xyrem. I do not see this patient at risk okay to continue with both medications. CD

## 2015-10-19 NOTE — Telephone Encounter (Signed)
[place]/[employer] is calling to see if an appeal is being made for Nuvigil.  If so could you please call patient with the case number [phone]([name], husband).(do not use 800- number given)

## 2015-10-19 NOTE — Patient Instructions (Signed)
Zonisamide capsules What is this medicine? ZONISAMIDE (zoe NIS a mide) is used to control partial seizures in adults with epilepsy. This medicine may be used for other purposes; ask your health care provider or pharmacist if you have questions.   ZONISAMIDE is used in Migraine prevention, too.    What should I tell my health care provider before I take this medicine? They need to know if you have any of these conditions: -dehydrated -diarrhea -history of metabolic acidosis (too much acid in your blood) -ketogenic diet -kidney disease -liver disease -lung disease -osteoporosis -suicidal thoughts, plans, or attempt; a previous suicide attempt by you or a family member -an unusual or allergic reaction to zonisamide, sulfa drugs, other medicines, foods, dyes, or preservatives -pregnant or trying to get pregnant -breast-feeding How should I use this medicine? Take this medicine by mouth with a glass of water. Follow the directions on the prescription label. Swallow whole. Do not break open the capsule. This medicine may be taken with or without food. Take your doses at regular intervals. Do not take your medicine more often than directed. Do not stop taking this medicine unless instructed by your doctor or health care professional. A special MedGuide will be given to you by the pharmacist with each prescription and refill. Be sure to read this information carefully each time. Talk to your pediatrician regarding the use of this medicine in children. While this drug may be prescribed for children as young as 50 years of age for selected conditions, precautions do apply. Overdosage: If you think you have taken too much of this medicine contact a poison control center or emergency room at once. NOTE: This medicine is only for you. Do not share this medicine with others. What if I miss a dose? If you miss a dose, take it as soon as you can. If it is almost time for your next dose, take only that  dose. Do not take double or extra doses. What may interact with this medicine? -barbiturates like phenobarbital -carbamazepine -phenytoin This list may not describe all possible interactions. Give your health care provider a list of all the medicines, herbs, non-prescription drugs, or dietary supplements you use. Also tell them if you smoke, drink alcohol, or use illegal drugs. Some items may interact with your medicine. What should I watch for while using this medicine? Visit your doctor or health care professional for regular checks on your progress. Wear a medical identification bracelet or chain to say you have epilepsy, and carry a card that lists all your medications. It is important to take this medicine exactly as directed. When first starting treatment, your dose will need to be adjusted slowly. It may take weeks or months before your dose is stable. You should contact your doctor or health care professional if your seizures get worse or if you have any new types of seizures. Do not stop taking except on your doctor's advice. You may develop a severe reaction. Your doctor will tell you how much medicine to take. You may get drowsy, dizzy, or have blurred vision. Do not drive, use machinery, or do anything that needs mental alertness until you know how this medicine affects you. To reduce dizzy or fainting spells, do not sit or stand up quickly, especially if you are an older patient. Alcohol can increase drowsiness and dizziness. Avoid alcoholic drinks. Avoid extreme heat. This medicine can cause you to sweat less than normal. Your body temperature could increase to dangerous levels, which may lead  to heat stroke. This medicine may increase the chance of developing metabolic acidosis. If left untreated, this can cause kidney stones, bone disease, or slowed growth in children. Symptoms include breathing fast, fatigue, loss of appetite, irregular heartbeat, or loss of consciousness. Call your  doctor immediately if you experience any of these side effects. Also, tell your doctor about any surgery you plan on having while taking this medicine since this may increase your risk for metabolic acidosis. This medicines may increase the risk of kidney stones. Drinking 6 to 8 glasses of water a day may help prevent the formation of kidney stones. The use of this medicine may increase the chance of suicidal thoughts or actions. Pay special attention to how you are responding while on this medicine. Any worsening of mood, or thoughts of suicide or dying should be reported to your health care professional right away. Women who become pregnant while using this medicine may enroll in the [place] Pregnancy Registry by calling [phone]. This registry collects information about the safety of antiepileptic drug use during pregnancy. What side effects may I notice from receiving this medicine? Side effects that you should report to your doctor or health care professional immediately: -allergic reactions like skin rash, itching or hives, swelling of the face, lips, or tongue -decreased sweating or a rise in body temperature, especially in patients under 45 years old -difficulty breathing or tightening of the throat -feeling faint or lightheaded, falls -fever, sore throat, sores in your mouth, or bruising easily -hallucination, loss of contact with reality -irregular heartbeat -loss of appetite -redness, blistering, peeling or loosening of the skin, including inside the mouth -severe drowsiness, difficulty concentrating, or coordination problems -speech or language problems -sudden back pain, abdominal pain, pain when urinating, bloody or dark urine -suicidal thoughts or depression -unusual changes in behavior or mood -unusually weak or tired -vomiting Side effects that usually do not require medical attention (report to your doctor or health care professional if they  continue or are bothersome): -headache -nausea This list may not describe all possible side effects. Call your doctor for medical advice about side effects. You may report side effects to FDA at 1-800-FDA-1088. Where should I keep my medicine? Keep out of reach of children. Store at room temperature between 15 and 30 degrees C (59 and 86 degrees F). Keep in a dry place protected from light. Throw away any unused medicine after the expiration date. NOTE: This sheet is a summary. It may not cover all possible information. If you have questions about this medicine, talk to your doctor, pharmacist, or health care provider.    2016, Elsevier/Gold Standard. (2010-08-18 15:16:42)

## 2015-10-19 NOTE — Telephone Encounter (Signed)
Ins denied our previous requests stating this drug is excluded from the formulary.  I called back.  Unfortunately, the patient said they did not qualify for patient assistance through [employer].  We have attempted to get this medication authorized numerous times (June 22 Ref # [id number]; July 14 Ref # [id number]; July 22 Ref # [id number]; Sept 7 Ref # [id number]).  I provided this info to patient as requested.   I called [employer] at the number provided as well.  Spoke with Caryl Pina.  She said we can resubmit the info for them to review again at fax # [phone].  Stated it can take 30 calendar days for a response, and they will notify the patient directly of the decision via outbound phone call and mail. Info has been faxed to the number provided.

## 2015-10-19 NOTE — Progress Notes (Signed)
[facility]   Provider:  Larey Seat, M D  Referring Provider: Josetta Huddle, MD Primary Care Physician:  Henrine Screws, MD  Chief Complaint  Patient presents with  . Follow-up    f/u narcolepsy, medication monitoring, rm 10, alone    HPI:  Debra Nelson is a 34 y.o. female , seen here as a revisit from Dr. Inda Merlin for treatment of hypersomnia, HLA positive  narcolepsy.    Chief complaint according to patient :" Recurrent hypersomnia "after the patient's medications were no longer approved by insurance.  She was changed from Nuvigil to Provigil and has not responded well. The patient is an HLA positive narcoleptic with co-morbidities of  Bipolar disorder. This limits the use of stimulants in her treatment.  She was changed from [place] to [place] by Psychiatry, and her anxiety level was lower than it had been in a long time. The patient felt that her psychiatric condition has been very well controlled with the new LATUDA  Medication- her anxiety level decreased she was much less irritable. Shortly after, the medication Nuvigil was not longer covered and she is now sleepier and less attentive.  Sleep studies : see media. The patient has been on Xyrem, and lost weight in the process.   Interval 10-19-15, Debra Nelson reports that she had a good but busy Thanksgiving and that she has been the main caretaker of her husband is recovering from her surgery. Overall she has lost more weight since being on Xyrem. Her insurance stopped covering the Nuvigil as I had reported at her last visit she was changed from Nuvigil to Provigil but had not responded as well and especially not to the generic form. She has noted that it takes her longer to fall asleep on Xyrem.  She needs to get back on Nuvigil for daytime sleepiness. Propranolol seems to work less for headache prophylaxis. The recurrence of headaches can also be related to the discontinuation of Nuvigil. We discussed today  that the medication can be imported from [place].  Social history: married, 2 children with ADD. One daughter from her first marriage, and a stepson with a learning disability form her husbands site.  The patient's husband who works as a Engineer, structural just underwent a septoplasty and turbinate reduction contracted post surgical infection. He is currently completing a ten-day course of Keflex.  Review of Systems: Out of a complete 14 system review, the patient complains of only the following symptoms, and all other reviewed systems are negative.   Epworth score 11 , Fatigue severity score 41 from 53   ,  depression score PHQ 9 :  1 point loss for losing pleasure, feeling depressed, feeling bad, trouble concentrating.  2 points for feeling tired loss of energy moving or thinking slowly,  3 points for poor appetite or overeating. Total 11 points.     Social History   Social History  . Marital Status: Married    Spouse Name: Debra Nelson  . Number of Children: 4  . Years of Education: College   Occupational History  .  Other   Social History Main Topics  . Smoking status: Never Smoker   . Smokeless tobacco: Never Used  . Alcohol Use: No  . Drug Use: No  . Sexual Activity: Not on file   Other Topics Concern  . Not on file   Social History Narrative   Patient is married (Debra Nelson) and lives at home with her family.   Patient has 4 children (3  of her own and 1 step son).   Patient has a college education.   Patient drinks 3-4 caffeine drinks daily.   Patient is right-handed.    Family History  Problem Relation Age of Onset  . Cancer Paternal Grandfather     Past Medical History  Diagnosis Date  . Pain in limb   . ADD (attention deficit disorder)   . Chronic anxiety   . Chronic insomnia   . Seasonal allergic rhinitis   . DJD (degenerative joint disease)     Severe of the right knee-Dr Creighton-ortho and Dr Gean Birchwood, PA-preferred pain managment  . GERD  (gastroesophageal reflux disease)   . Asthma     episodic  . Dysthymic disorder   . RLS (restless legs syndrome)   . Bipolar disorder with depression ([place])     and PTSD  . HA (headache)   . Deviated septum     No past surgical history on file.  Current Outpatient Prescriptions  Medication Sig Dispense Refill  . albuterol (PROVENTIL HFA;VENTOLIN HFA) 108 (90 BASE) MCG/ACT inhaler Inhale 2 puffs into the lungs every 6 (six) hours as needed for wheezing or shortness of breath.    . eletriptan (RELPAX) 40 MG tablet Take 40 mg by mouth as needed for migraine or headache. One tablet by mouth at onset of headache. May repeat in 2 hours if headache persists or recurs.    Debra Nelson Kitchen EPIPEN 2-PAK 0.3 MG/0.3ML SOAJ injection as needed.    . gabapentin (NEURONTIN) 300 MG capsule Take 400 mg by mouth. One capsule in am and early evening. 2 capsules at bedtime    . ibuprofen (ADVIL,MOTRIN) 200 MG tablet Take 200 mg by mouth 3 (three) times daily.    Debra Nelson Kitchen lamoTRIgine (LAMICTAL) 100 MG tablet Take 100 mg by mouth 2 (two) times daily.    Debra Nelson Kitchen LATUDA 60 MG TABS 30 mg.    . lithium carbonate (LITHOBID) 300 MG CR tablet     . metoprolol succinate (TOPROL-XL) 25 MG 24 hr tablet Take 50 mg by mouth daily.     . modafinil (PROVIGIL) 200 MG tablet     . montelukast (SINGULAIR) 10 MG tablet Take 10 mg by mouth at bedtime.    . Multiple Vitamin (MULTIVITAMIN) tablet Take 1 tablet by mouth daily.    Debra Nelson Kitchen OVER THE COUNTER MEDICATION Biotin, vitamin E, Calcium D, cranberry, vitamin c    . pantoprazole (PROTONIX) 40 MG tablet 1 tablet 2 (two) times daily.    . promethazine (PHENERGAN) 25 MG tablet Take 25 mg by mouth every 6 (six) hours as needed for nausea or vomiting.    . Sodium Oxybate (XYREM) 500 MG/ML SOLN 3 grams po for first dose and 4.5 grams for second dose nightly as directed 270 mL 5  . Armodafinil (NUVIGIL) 250 MG tablet Take 1 tablet (250 mg total) by mouth daily. (Patient not taking: Reported on 10/19/2015) 30 tablet  5   No current facility-administered medications for this visit.    Allergies as of 10/19/2015 - Review Complete 10/19/2015  Allergen Reaction Noted  . Klonopin [clonazepam] Anaphylaxis and Nausea Only 12/19/2013  . Phenergan [promethazine hcl]  12/26/2011  . Topamax [topiramate] Nausea And Vomiting 12/19/2013    Vitals: BP 118/90 mmHg  Pulse 78  Resp 20  Ht $R'5\' 1"'Hg$  (1.549 m)  Wt 144 lb (65.318 kg)  BMI 27.22 kg/m2 Last Weight:  Wt Readings from Last 1 Encounters:  10/19/15 144 lb (65.318 kg)   ZOX:WRUE mass index  is 27.22 kg/(m^2).     Last Height:   Ht Readings from Last 1 Encounters:  10/19/15 5\' 1"  (1.549 m)    Physical exam:  General: The patient is awake, alert and appears not in acute distress. The patient is well groomed. Head: Normocephalic, atraumatic. Neck is tense - paraspinal tenderness . Mallampati 2-3 , crowded lower  jaw,  neck circumference:13.75 inches  . Nasal airflow unrestricted , TMJ click not evident . Retrognathia is not seen.  Cardiovascular:  Regular rate and rhythm, without  murmurs or carotid bruit, and without distended neck veins. Respiratory: Lungs are clear to auscultation. Skin:  Without evidence of edema, or rash Trunk: BMI is normal . The patient's posture is erect    Neurologic exam : The patient is awake and alert, oriented to place and time.   Memory subjective described as intact.   Attention span & concentration ability appears normal.  Speech is fluent, without dysarthria, dysphonia or aphasia.  Mood and affect are appropriate.  Cranial nerves: Pupils are equal and briskly reactive to light.  Funduscopic exam without  evidence of pallor or edema.  Extraocular movements  in vertical and horizontal planes intact and without nystagmus. Visual fields by finger perimetry are intact. Hearing to finger rub intact.  Facial sensation intact to fine touch. Facial motor strength is symmetric and tongue and uvula move midline. Shoulder  shrug was symmetrical.   Motor exam: Normal tone, muscle bulk and symmetric strength in all extremities.  Sensory:  Fine touch, pinprick and vibration were tested in all extremities.  Proprioception tested in the upper extremities was normal.  Coordination: Rapid alternating movements in the fingers/hands was normal.  Finger-to-nose maneuver  normal without evidence of ataxia, dysmetria or tremor.  Gait and station: Patient walks without assistive device and is able unassisted to climb up to the exam table. Strength within normal limits.  Stance is stable and normal.  Toe and hell stand were tested .Tandem gait is unfragmented.  Turns with  Steps. Romberg testing is negative.  Deep tendon reflexes: in the  upper and lower extremities are symmetric and intact. Babinski maneuver response is  downgoing.  The patient was advised of the nature of the diagnosed sleep disorder , the treatment options and risks for general a health and wellness arising from not treating the condition.  I spent more than 25 minutes of face to face time with the patient. Greater than 50% of time was spent in counseling and coordination of care. We have discussed the diagnosis and differential and I answered the patient's questions.     Assessment:  After physical and neurologic examination, review of laboratory studies,  Personal review of imaging studies, reports of other /same  Imaging studies ,   Results of polysomnography/ neurophysiology testing and pre-existing records as far as provided in visit.,  my assessment is:   1) Narcolepsy confirmed by HLA - XYREM and NUVIGIL controlled her hypersomnia and Latuda controlled her anxiety.  With the change to Provigil, this success has been partially undone and the patient is again hypersomnic.     2) weight has drastically changed up and down, this is likely influenced by her bipolar disorder. Medication side effect influences this, too.  Currently normal BMI after  XYREM indiced weight loss.   3)No cataplexy on XYREM.  Only one time does she recall that she get angry at her body get tingly but not truly weak. This is a common forme fruste of cataplexy.  Plan:  Treatment plan and additional workup :  Narcolepsy: HLA positive . The patient will remain on Xyrem and for her safety we will obtain liver function tests metabolic panel and CBC today.  In addition I will write a letter to her insurance company explaining that she will need Nuvigil and that modafinil was not a sufficient substitute for the brand name drug. I hope this will get through. Other wise a [facility] may be her best option.   Migraine , currently on Relpax, propranolol not loner working well. She has borderline low BP. I would like to consider a change to Depakote, but she is afraid of weight gain . She failed topiramate. She is already on Gabapentin.  I spoke briefly to my colleague Dr. Jaynee Eagles and she suggested zonisamide as a good option 100 mg starting dose at night only. I provide a prescription for this today.  Bipolar on Latuda : She has manic spells. The patient seems to be not in the depths of her depression at this point her pH Q9 was endorsed at 11 points. Rv every 3 month for [facility] check.      Asencion Partridge Ticia Virgo MD  10/19/2015   CC: Josetta Huddle, Md [address] [place], [place] [address]

## 2015-10-19 NOTE — Telephone Encounter (Signed)
Faxed this conversation to the fax number listed. Received a confirmation fax that it was received.

## 2015-10-20 LAB — COMPREHENSIVE METABOLIC PANEL
ALT: 11 IU/L (ref 0–32)
AST: 12 IU/L (ref 0–40)
Albumin/Globulin Ratio: 2.4 (ref 1.1–2.5)
Albumin: 4.5 g/dL (ref 3.5–5.5)
Alkaline Phosphatase: 78 IU/L (ref 39–117)
BUN/Creatinine Ratio: 11 (ref 8–20)
BUN: 10 mg/dL (ref 6–20)
Bilirubin Total: 0.3 mg/dL (ref 0.0–1.2)
CO2: 25 mmol/L (ref 18–29)
Calcium: 10 mg/dL (ref 8.7–10.2)
Chloride: 105 mmol/L (ref 97–106)
Creatinine, Ser: 0.88 mg/dL (ref 0.57–1.00)
GFR calc Af Amer: 98 mL/min/{1.73_m2} (ref >59)
GFR calc non Af Amer: 85 mL/min/{1.73_m2} (ref >59)
Globulin, Total: 1.9 g/dL (ref 1.5–4.5)
Glucose: 109 mg/dL — ABNORMAL HIGH (ref 65–99)
Potassium: 5.9 mmol/L — ABNORMAL HIGH (ref 3.5–5.2)
Sodium: 146 mmol/L — ABNORMAL HIGH (ref 136–144)
Total Protein: 6.4 g/dL (ref 6.0–8.5)

## 2015-10-20 LAB — LITHIUM LEVEL: Lithium Lvl: 0.3 mmol/L — ABNORMAL LOW (ref 0.6–1.4)

## 2015-10-20 NOTE — Telephone Encounter (Signed)
Appeal info was faxed to number provided by ins yesterday, however we have resent all info again today to the number provided by patient.

## 2015-10-20 NOTE — Telephone Encounter (Signed)
Husband Debra Nelson [phone] called to advise that insurance advised that the 4 reference numbers were for prior authorization NOT for appeal. Insurance company needs records for why Provigil didn't work and submit for [employer] or medical necessity for [employer]. Needs appeal, not prior authorization. Fax# [phone] and it needs to say Urgent Appeal and explain medical necessity for it.

## 2015-10-25 ENCOUNTER — Telehealth: Payer: Self-pay

## 2015-10-25 NOTE — Telephone Encounter (Signed)
Pt's husband [name] called to check on status of appeal. He was advised that it was faxed on 11/30 and to call the ins company to make sure that received it.

## 2015-10-25 NOTE — Telephone Encounter (Signed)
Called pt to discuss lab results. No answer, left a message asking that she call me back.

## 2015-10-27 NOTE — Telephone Encounter (Signed)
Spoke to pt. She has seen these results on mychart. She reports that she had run out of her lithium and that is why it is low. She denies any questions at this time. She was advised to call us back with any questions or concerns. Pt verbalized understanding.

## 2015-10-27 NOTE — Telephone Encounter (Signed)
-----   Message from Larey Seat, MD sent at 10/22/2015  8:52 AM EST ----- Dear Mrs. Cisar, the blood test showed a higher sodium level which is explained by the salty medication you take, namely Xyrem. Your potassium level was also an expectantly higher but this is unrelated to any changed in kidney  or liver function. You lithium level was 0.3 which is low.CD

## 2015-12-20 ENCOUNTER — Other Ambulatory Visit: Payer: Self-pay

## 2015-12-20 NOTE — Telephone Encounter (Signed)
Pt's Nuvigil was denied by her insurance, will not be covered. Received a fax from [facility] requesting a refill on the modafinil 200mg  take 1.5 tablets daily as directed. This has not been prescribed by our office since 06/21/2015.

## 2015-12-21 MED ORDER — MODAFINIL 200 MG PO TABS: ORAL_TABLET | ORAL | Status: DC

## 2016-01-07 ENCOUNTER — Other Ambulatory Visit: Payer: Self-pay | Admitting: Neurology

## 2016-01-17 ENCOUNTER — Encounter: Payer: Self-pay | Admitting: Neurology

## 2016-01-17 ENCOUNTER — Ambulatory Visit (INDEPENDENT_AMBULATORY_CARE_PROVIDER_SITE_OTHER): Payer: 59 | Admitting: Neurology

## 2016-01-17 VITALS — BP 112/82 | HR 84 | Resp 20 | Ht 61.0 in | Wt 148.0 lb

## 2016-01-17 DIAGNOSIS — Z5181 Encounter for therapeutic drug level monitoring: Secondary | ICD-10-CM

## 2016-01-17 DIAGNOSIS — F3110 Bipolar disorder, current episode manic without psychotic features, unspecified: Secondary | ICD-10-CM

## 2016-01-17 DIAGNOSIS — G43009 Migraine without aura, not intractable, without status migrainosus: Secondary | ICD-10-CM

## 2016-01-17 DIAGNOSIS — F19982 Other psychoactive substance use, unspecified with psychoactive substance-induced sleep disorder: Secondary | ICD-10-CM

## 2016-01-17 DIAGNOSIS — G47411 Narcolepsy with cataplexy: Secondary | ICD-10-CM | POA: Diagnosis not present

## 2016-01-17 DIAGNOSIS — Z79899 Other long term (current) drug therapy: Secondary | ICD-10-CM

## 2016-01-17 MED ORDER — ARMODAFINIL 250 MG PO TABS: 250.0000 mg | ORAL_TABLET | Freq: Every day | ORAL | Status: DC

## 2016-01-17 MED ORDER — ZONISAMIDE 50 MG PO CAPS: 100.0000 mg | ORAL_CAPSULE | Freq: Every day | ORAL | Status: DC

## 2016-01-17 MED ORDER — PROMETHAZINE HCL 25 MG PO TABS: 25.0000 mg | ORAL_TABLET | Freq: Four times a day (QID) | ORAL | Status: DC | PRN

## 2016-01-17 MED ORDER — ELETRIPTAN HYDROBROMIDE 40 MG PO TABS: 40.0000 mg | ORAL_TABLET | ORAL | Status: DC | PRN

## 2016-01-17 MED ORDER — XYREM 500 MG/ML PO SOLN: ORAL | Status: DC

## 2016-01-17 NOTE — Addendum Note (Signed)
Addended by: Larey Seat on: 01/17/2016 10:32 AM   Modules accepted: Orders

## 2016-01-17 NOTE — Progress Notes (Signed)
SLEEP MEDICINE CLINIC   Provider:  Larey Seat, M D  Referring Provider: Josetta Huddle, MD Primary Care Physician:  Henrine Screws, MD  Chief Complaint  Patient presents with  . Follow-up    on xyrem    HPI:  Debra Nelson is a 35 y.o. female , seen here as a revisit from Dr. Inda Merlin for treatment of hypersomnia, HLA positive for Narcolepsy.    Chief complaint according to patient :" Recurrent hypersomnia "after the patient's medications were no longer approved by insurance.  She was changed from Nuvigil to Provigil and has not responded well. The patient is an HLA positive narcoleptic with co-morbidities of  Bipolar disorder. This limits the use of stimulants in her treatment.  She was changed from [place] to [place] by Psychiatry, and her anxiety level was lower than it had been in a long time.  The patient felt that her psychiatric condition has been very well controlled with the new LATUDA  Medication- her anxiety level decreased she was much less irritable. Shortly after, the medication Nuvigil was not longer covered and she is now sleepier and less attentive.  Sleep studies : see media. The patient has been on Xyrem, and lost weight in the process.   Interval 10-19-15, [name] reports that she had a good but busy Thanksgiving and that she has been the main caretaker of her husband is recovering from her surgery. Overall she has lost more weight since being on Xyrem. Her insurance stopped covering the Nuvigil as I had reported at her last visit she was changed from Nuvigil to Provigil but had not responded as well and especially not to the generic form. She has noted that it takes her longer to fall asleep on Xyrem.  She needs to get back on Nuvigil for daytime sleepiness. Propranolol seems to work less for headache prophylaxis. The recurrence of headaches can also be related to the discontinuation of Nuvigil. We discussed today that the medication can be imported from  [place].   Interval history form 01-17-16. Patient feels well, has less daytime sleepiness and no cataplexy. She was on pain medications and could not take XYREM at the same time, restarted XYREM just 3 weeks ago- now sleep walking , Parasomnia.  She dos not drink ETOH, not longer on pain medication.   She is taking 3 gram first dose and 4.5 second dose.  I will increase to 3.5 and 4.5 gram.  Social history: married, 2 children with ADD. One daughter from her first marriage, and a stepson with a learning disability form her husbands site.  She has had recent labs- elevated sodium , at 109- likely Xyrem related.  Potassium 5.9. Dated 10-27-15.    Review of Systems: 01-17-16 Out of a complete 14 system review, the patient complains of only the following symptoms, and all other reviewed systems are negative.  Fatigue severity score  Again 54 from 41 on xyrem  from 53, before xyrem   depression score PHQ 9 :  How likely are you to doze in the following situations: 0 = not likely, 1 = slight chance, 2 = moderate chance, 3 = high chance  Sitting and Reading? 2 Watching Television?2 Sitting inactive in a public place (theater or meeting)?1 As a passenger in a car for an hour without a break? 2 Lying down in the afternoon when circumstances permit? 3 Sitting and talking to someone?o Sitting quietly after lunch without alcohol? 3 In a car, while stopped for a few  minutes in traffic?0   Total = 13 on Xyrem       Social History   Social History  . Marital Status: Married    Spouse Name: Debra Nelson  . Number of Children: 4  . Years of Education: College   Occupational History  .  Other   Social History Main Topics  . Smoking status: Never Smoker   . Smokeless tobacco: Never Used  . Alcohol Use: No  . Drug Use: No  . Sexual Activity: Not on file   Other Topics Concern  . Not on file   Social History Narrative   Patient is married (Debra Nelson) and lives at home with her family.    Patient has 4 children (3 of her own and 1 step son).   Patient has a college education.   Patient drinks 3-4 caffeine drinks daily.   Patient is right-handed.    Family History  Problem Relation Age of Onset  . Cancer Paternal Grandfather     Past Medical History  Diagnosis Date  . Pain in limb   . ADD (attention deficit disorder)   . Chronic anxiety   . Chronic insomnia   . Seasonal allergic rhinitis   . DJD (degenerative joint disease)     Severe of the right knee-Dr Creighton-ortho and Dr Gean Birchwood, PA-preferred pain managment  . GERD (gastroesophageal reflux disease)   . Asthma     episodic  . Dysthymic disorder   . RLS (restless legs syndrome)   . Bipolar disorder with depression ([place])     and PTSD  . HA (headache)   . Deviated septum     History reviewed. No pertinent past surgical history.  Current Outpatient Prescriptions  Medication Sig Dispense Refill  . albuterol (PROVENTIL HFA;VENTOLIN HFA) 108 (90 BASE) MCG/ACT inhaler Inhale 2 puffs into the lungs every 6 (six) hours as needed for wheezing or shortness of breath.    . eletriptan (RELPAX) 40 MG tablet Take 40 mg by mouth as needed for migraine or headache. One tablet by mouth at onset of headache. May repeat in 2 hours if headache persists or recurs.    Marland Kitchen EPIPEN 2-PAK 0.3 MG/0.3ML SOAJ injection as needed.    . gabapentin (NEURONTIN) 300 MG capsule Take 400 mg by mouth. One capsule in am and early evening. 2 capsules at bedtime    . ibuprofen (ADVIL,MOTRIN) 200 MG tablet Take 200 mg by mouth 3 (three) times daily.    Marland Kitchen lamoTRIgine (LAMICTAL) 100 MG tablet Take 100 mg by mouth 2 (two) times daily.    Marland Kitchen LATUDA 60 MG TABS 30 mg.    . lithium carbonate (LITHOBID) 300 MG CR tablet     . modafinil (PROVIGIL) 200 MG tablet Take 1.5 tablets by mouth daily 45 tablet 0  . montelukast (SINGULAIR) 10 MG tablet Take 10 mg by mouth at bedtime.    . Multiple Vitamin (MULTIVITAMIN) tablet Take 1 tablet by mouth daily.     Marland Kitchen OVER THE COUNTER MEDICATION Biotin, vitamin E, Calcium D, cranberry, vitamin c    . pantoprazole (PROTONIX) 40 MG tablet 1 tablet 2 (two) times daily.    . promethazine (PHENERGAN) 25 MG tablet Take 25 mg by mouth every 6 (six) hours as needed for nausea or vomiting.    . Sodium Oxybate (XYREM) 500 MG/ML SOLN 3 grams po for first dose and 4.5 grams for second dose nightly as directed 270 mL 5  . zonisamide (ZONEGRAN) 50 MG capsule TAKE 1 CAPSULE  NIGHTLY X8 DAYS. IF TOLERATED, GO UP TO 2 CAPS AT NIGHT. 60 capsule 0  . Armodafinil (NUVIGIL) 250 MG tablet Take 1 tablet (250 mg total) by mouth daily. (Patient not taking: Reported on 10/19/2015) 30 tablet 5   No current facility-administered medications for this visit.    Allergies as of 01/17/2016 - Review Complete 01/17/2016  Allergen Reaction Noted  . Klonopin [clonazepam] Anaphylaxis and Nausea Only 12/19/2013  . Phenergan [promethazine hcl]  12/26/2011  . Topamax [topiramate] Nausea And Vomiting 12/19/2013    Vitals: BP 112/82 mmHg  Pulse 84  Resp 20  Ht '5\' 1"'$  (1.549 m)  Wt 148 lb (67.132 kg)  BMI 27.98 kg/m2 Last Weight:  Wt Readings from Last 1 Encounters:  01/17/16 148 lb (67.132 kg)   ZOX:WRUE mass index is 27.98 kg/(m^2).     Last Height:   Ht Readings from Last 1 Encounters:  01/17/16 '5\' 1"'$  (1.549 m)    Physical exam:  General: The patient is awake, alert and appears not in acute distress. The patient is well groomed. Head: Normocephalic, atraumatic. Neck is tense - paraspinal tenderness . Mallampati 2-3 , crowded lower  jaw,  neck circumference:13.75 inches  . Nasal airflow unrestricted , TMJ click not evident . Retrognathia is not seen.  Cardiovascular:  Regular rate and rhythm, without  murmurs or carotid bruit, and without distended neck veins. Respiratory: Lungs are clear to auscultation. Skin:  Without evidence of edema, or rash Trunk: BMI is normal . The patient's posture is erect    Neurologic exam : The  patient is awake and alert, oriented to place and time.   Memory subjective described as intact.   Attention span & concentration ability appears normal.  Speech is fluent, without dysarthria, dysphonia or aphasia.  Mood and affect are appropriate, she is conversant .  Cranial nerves: Pupils are equal and briskly reactive to light. Funduscopic exam without  evidence of pallor or edema.  Extraocular movements  in vertical and horizontal planes intact and without nystagmus. Visual fields by finger perimetry are intact. Hearing to finger rub intact. Facial sensation intact to fine touch.Facial motor strength is symmetric and tongue and uvula move midline. Shoulder shrug was symmetrical.   Motor exam: Normal tone, muscle bulk and symmetric strength in all extremities.  Sensory:  Fine touch, pinprick and vibration were tested in all extremities.  Proprioception tested in the upper extremities was normal.  Coordination:  Finger-to-nose maneuver  normal without evidence of ataxia, dysmetria or tremor.  Gait and station: Patient walks without assistive device and is able unassisted to climb up to the exam table. Strength within normal limits.  Stance is stable and normal.   Deep tendon reflexes: in the  upper and lower extremities are symmetric and intact.   The patient was advised of the nature of the diagnosed sleep disorder , the treatment options and risks for general a health and wellness arising from not treating the condition.  I spent more than 25 minutes of face to face time with the patient. Greater than 50% of time was spent in counseling and coordination of care. We have discussed the diagnosis and differential and I answered the patient's questions.   Parasomnia activity on XYREM - she noted this only after the first dose, not the second. The patient had been off 2 month of Xyrem - was on pain medication at the same time.  Our main goal is to monitor for sodium and potassium  today. parasomnia safety discussed.  Assessment:  After physical and neurologic examination, review of laboratory studies,  Personal review of imaging studies, reports of other /same  Imaging studies ,   Results of polysomnography/ neurophysiology testing and pre-existing records as far as provided in visit.,  my assessment is:    1) Narcolepsy confirmed by HLA - XYREM and NUVIGIL controlled her hypersomnia and Latuda controlled her anxiety.  With the change to Provigil, this success has been partially undone and the patient is again hypersomnic.     2) weight has drastically changed up and down, this is likely influenced by her bipolar disorder. Medication side effect influences this, too.  Currently normal BMI after XYREM induced weight loss.   3) No cataplexy on XYREM.  Now onset of parasomnia.   Only one time does she recall that she get angry - and her body get tingly but not truly weak. This is a common forme fruste of cataplexy.  4) Depression score is decreased- PHQ 9 -8 points.   5) Headaches .    Plan:  Treatment plan and additional workup :  Narcolepsy: HLA positive . The patient will remain on Xyrem and for her safety we will obtain liver function tests metabolic panel and CBC today.  In addition I will write a letter to her insurance company explaining that she will need Nuvigil and that modafinil was not a sufficient substitute for the brand name drug. I hope this will get through. Other wise a [place] may be her best option.   Migraine, currently on Relpax, propranolol not loner working well. She has borderline low BP. I would like to consider a change to Depakote, but she is afraid of weight gain . She failed topiramate. She is already on Gabapentin. I spoke briefly to my colleague Dr. Jaynee Eagles and she suggested Zonisamide as a good option 100 mg starting dose at night only. I provide a prescription for this today.  Bipolar on Latuda : She has had no recent  manic spells. The patient seems to be not longer be in the depths of her depression at this point her pH Q9 was endorsed at 9 points. Rv every 3 month for [facility] check.      Asencion Partridge Deran Barro MD  01/17/2016   CC: Josetta Huddle, Md [address] [place] [address] [place], [place] [address]

## 2016-01-17 NOTE — Patient Instructions (Addendum)
As discussed, Xyrem has to be taken with very mindful caution: Taking Xyrem correctly is key. This means, take it only when you are fully ready to fall asleep, while in bed and refrain from doing any other activities, even brushing  your teeth after taking your first dose. The second dose will be about 2-1/2-4 hours after his first dose. You can go to the bathroom before your 2nd dose. Take your first dose, when actually IN BED, ready to sleep. No sitting up in bed, NO reading, NO using the cell phone or computer, NO getting up to use the bathroom. Take care of everything BEFORE sleep time. Try NOT to skip the second dose as the Xyrem is not going to stay in your system long enough with only one dose. Do not drink alcohol with Xyrem. If you do drink Alcohol, you cannot take your Xyrem doses that night.  Armodafinil tablets What is this medicine? ARMODAFINIL (ar moe DAF i nil) is used to treat excessive sleepiness caused by certain sleep disorders. This includes narcolepsy, sleep apnea, and shift work sleep disorder. This medicine may be used for other purposes; ask your health care provider or pharmacist if you have questions. What should I tell my health care provider before I take this medicine? They need to know if you have any of these conditions: -kidney disease -liver disease -an unusual or allergic reaction to armodafinil, modafinil, medicines, foods, dyes, or preservatives -pregnant or trying to get pregnant -breast-feeding How should I use this medicine? Take this medicine by mouth with a glass of water. Follow the directions on the prescription label. Take your doses at regular intervals. Do not take your medicine more often than directed. Do not stop taking except on your doctor's advice. A special MedGuide will be given to you by the pharmacist with each prescription and refill. Be sure to read this information carefully each time. Talk to your pediatrician regarding the use of this  medicine in children. While this drug may be prescribed for children as young as 109 years of age for selected conditions, precautions do apply. Overdosage: If you think you have taken too much of this medicine contact a poison control center or emergency room at once. NOTE: This medicine is only for you. Do not share this medicine with others. What if I miss a dose? If you miss a dose, take it as soon as you can. If it is almost time for your next dose, take only that dose. Do not take double or extra doses. What may interact with this medicine? Do not take this medicine with any of the following medications: -amphetamine or dextroamphetamine -dexmethylphenidate or methylphenidate -MAOIs like Carbex, Eldepryl, Marplan, Nardil, and Parnate -pemoline -procarbazine This medicine may also interact with the following medications: -antifungal medicines like itraconazole or ketoconazole -barbiturates, like phenobarbital -birth control pills or other hormone-containing birth control devices or implants -carbamazepine -cyclosporine -diazepam -medicines for depression, anxiety, or psychotic disturbances -phenytoin -propranolol -triazolam -warfarin This list may not describe all possible interactions. Give your health care provider a list of all the medicines, herbs, non-prescription drugs, or dietary supplements you use. Also tell them if you smoke, drink alcohol, or use illegal drugs. Some items may interact with your medicine. What should I watch for while using this medicine? Visit your doctor or health care professional for regular checks on your progress. The full effect of this medicine may not be seen right away. This medicine may affect your concentration, function, or may hide  signs that you are tired. You may get dizzy. Do not drive, use machinery, or do anything that needs mental alertness until you know how this drug affects you. Alcohol can make you more dizzy and may interfere with  your response to this medicine or your alertness. Avoid alcoholic drinks. Birth control pills may not work properly while you are taking this medicine. Talk to your doctor about using an extra method of birth control. It is unknown if the effects of this medicine will be increased by the use of caffeine. Caffeine is found in many foods, beverages, and medications. Ask your doctor if you should limit or change your intake of caffeine-containing products while on this medicine. What side effects may I notice from receiving this medicine? Side effects that you should report to your doctor or health care professional as soon as possible: -allergic reactions like skin rash, itching or hives, swelling of the face, lips, or tongue -breathing problems -chest pain -fast, irregular heartbeat -increased blood pressure, particularly if you have high blood pressure -mental problems -sore throat, fever, or chills -tremors -vomiting Side effects that usually do not require medical attention (report to your doctor or health care professional if they continue or are bothersome): -headache -nausea, diarrhea, or stomach upset -nervousness -trouble sleeping This list may not describe all possible side effects. Call your doctor for medical advice about side effects. You may report side effects to FDA at 1-800-FDA-1088. Where should I keep my medicine? Keep out of the reach of children. Store at room temperature between 20 and 25 degrees C (68 and 77 degrees F). Throw away any unused medicine after the expiration date. NOTE: This sheet is a summary. It may not cover all possible information. If you have questions about this medicine, talk to your doctor, pharmacist, or health care provider.    2016, Elsevier/Gold Standard. (2014-07-28 14:57:07)

## 2016-01-18 ENCOUNTER — Telehealth: Payer: Self-pay

## 2016-01-18 LAB — COMPREHENSIVE METABOLIC PANEL
ALT: 18 IU/L (ref 0–32)
AST: 17 IU/L (ref 0–40)
Albumin/Globulin Ratio: 2.4 (ref 1.1–2.5)
Albumin: 4.7 g/dL (ref 3.5–5.5)
Alkaline Phosphatase: 106 IU/L (ref 39–117)
BUN/Creatinine Ratio: 6 — ABNORMAL LOW (ref 8–20)
BUN: 4 mg/dL — ABNORMAL LOW (ref 6–20)
Bilirubin Total: <0.2 mg/dL (ref 0.0–1.2)
CO2: 24 mmol/L (ref 18–29)
Calcium: 9.6 mg/dL (ref 8.7–10.2)
Chloride: 105 mmol/L (ref 96–106)
Creatinine, Ser: 0.68 mg/dL (ref 0.57–1.00)
GFR calc Af Amer: 130 mL/min/{1.73_m2} (ref >59)
GFR calc non Af Amer: 113 mL/min/{1.73_m2} (ref >59)
Globulin, Total: 2 g/dL (ref 1.5–4.5)
Glucose: 105 mg/dL — ABNORMAL HIGH (ref 65–99)
Potassium: 3.9 mmol/L (ref 3.5–5.2)
Sodium: 143 mmol/L (ref 134–144)
Total Protein: 6.7 g/dL (ref 6.0–8.5)

## 2016-01-18 LAB — LITHIUM LEVEL: Lithium Lvl: 0.7 mmol/L (ref 0.6–1.4)

## 2016-01-18 NOTE — Telephone Encounter (Signed)
Called to discuss pt's labs but she had already seen them on mychart and has no further questions.

## 2016-01-18 NOTE — Telephone Encounter (Signed)
-----   Message from Larey Seat, MD sent at 01/18/2016  8:13 AM EST ----- Normal lithium level at 0.7 , normal metabolic panel for non fast specimen . Low creatinine is not abnormal, it means good hydration and good renal function!

## 2016-01-24 ENCOUNTER — Telehealth: Payer: Self-pay | Admitting: Neurology

## 2016-01-24 NOTE — Telephone Encounter (Signed)
The RX for armodafinil before the one Dr. Brett Fairy wrote on 01/17/16 was also for just 1 tablet. (nuvigil 250 mg take 1 tablet by mouth daily, dispense 30 tablets with 5 refills on 07/08/2015)  I checked Dr. Edwena Felty notes and there is nothing in them regarding an increase in nuvigil. I see that pt was on in the past 1.5 tablets daily, but the latest 2 RXs do not reflect this.   I spoke to pt. She insists that she has always been on 1.5 tablets daily and is not sure why it was written for only 1 daily. She is requesting to pick up a hard copy of the RX so she does not have to use a mail order. I advised her that Dr. Brett Fairy is out of the office and I will have to send this to our [facility]. Pt verbalized understanding.

## 2016-01-24 NOTE — Telephone Encounter (Signed)
Patient is calling and states that the written Rx armodafinil(NUVIGIL)250 MG tablets should have been written for 1 tablet morning, 1/2 tablet lunch.  Please re-write for pick up. Thanks!

## 2016-01-25 NOTE — Telephone Encounter (Signed)
Spoke to pt and advised her that Dr. Brett Fairy would have to change this RX and she is expected to be back in the office on Monday. Pt verbalized understanding.

## 2016-01-25 NOTE — Telephone Encounter (Signed)
Dr. Brett Fairy will have to address this when she is back thanks

## 2016-01-26 ENCOUNTER — Other Ambulatory Visit: Payer: Self-pay | Admitting: Neurology

## 2016-01-26 DIAGNOSIS — G4710 Hypersomnia, unspecified: Secondary | ICD-10-CM

## 2016-01-26 MED ORDER — ARMODAFINIL 250 MG PO TABS: 250.0000 mg | ORAL_TABLET | Freq: Every day | ORAL | Status: DC

## 2016-01-26 NOTE — Telephone Encounter (Signed)
I redid the prescription and gave to Katrina. Thanks.  Rosalin Hawking, MD PhD Stroke Neurology 01/26/2016 6:01 PM

## 2016-01-26 NOTE — Telephone Encounter (Signed)
Dr. Brett Fairy printed out the [employer] from home. Dr. Erlinda Hong is the work in this afternoon. Dr. Erlinda Hong, do you mind signing the printed RX that Dr. Brett Fairy completed at home? I have given it to [place].

## 2016-01-27 ENCOUNTER — Telehealth: Payer: Self-pay | Admitting: Neurology

## 2016-01-27 ENCOUNTER — Other Ambulatory Visit: Payer: Self-pay

## 2016-01-27 DIAGNOSIS — G4710 Hypersomnia, unspecified: Secondary | ICD-10-CM

## 2016-01-27 MED ORDER — ARMODAFINIL 250 MG PO TABS: ORAL_TABLET | ORAL | Status: DC

## 2016-01-27 NOTE — Telephone Encounter (Signed)
Kristen(RN) has prescriptions and will call the patient.

## 2016-01-27 NOTE — Telephone Encounter (Signed)
The prescription Dr. Brett Fairy and Dr. Erlinda Hong signed for nuvigil had a sig that stated" Take 1 tablet by mouth daily. Take 1.5 mg daily". Dr. Brett Fairy authorized this RX on 01/26/16, but was not in the office to sign it, and therefore asked Dr. Erlinda Hong [facility]) to sign it for her. He reprinted the same RX under his name.  However, the sig is conflicting, and the pharmacy will not accept the RX. I went to Dr. Rexene Alberts [facility] for the am today), but she will not reauthorize the sig that Dr. Brett Fairy wrote yesterday because there is nothing in the notes that reflect this change. (See Dr. Guadelupe Sabin note on this pt from today.) I went to Dr. Erlinda Hong again with the sig corrected to say "Take 1.5 tabs daily" as Dr. Brett Fairy intended the sig to say, based on her print out yesterday and he signed it.  Pt is aware that the RX is ready for pick up at the front desk as she requested.

## 2016-01-27 NOTE — Telephone Encounter (Signed)
I reviewed Dr. Edwena Felty note from 01/17/2016, which indicates the patient will be restarted on Nuvigil 250 mg once daily. She was given a prescription with 30 pills and 5 refills. She does not require a new prescription. If there is an increase in dose, Dr. Brett Fairy will have to address when she comes back. Please advise patient that the prescription she has from 01/17/2016 is valid.

## 2016-01-28 NOTE — Telephone Encounter (Signed)
Patient requested 1.5 tabs per day?

## 2016-01-31 ENCOUNTER — Telehealth: Payer: Self-pay | Admitting: Neurology

## 2016-01-31 MED ORDER — ARMODAFINIL 250 MG PO TABS: ORAL_TABLET | ORAL | Status: DC

## 2016-01-31 NOTE — Telephone Encounter (Signed)
Spoke to the pharmacist at [facility] and advised her that Dr. Brett Fairy does want pt to take 1.5 tabs of nuvigil daily.

## 2016-01-31 NOTE — Addendum Note (Signed)
Addended by: Larey Seat on: 01/31/2016 02:10 PM   Modules accepted: Orders

## 2016-01-31 NOTE — Telephone Encounter (Signed)
Yes, i want her to have 1.5 tabs per day. CD

## 2016-01-31 NOTE — Telephone Encounter (Signed)
I spoke to pt. She insists that she has always been on 1.5 tablets daily and is not sure why it was written for only 1 daily.   Is this still ok? It appears that the RX has originally been written for 1 tablet daily, and then on 01/26/16 you changed it to 1.5 tablets daily per patient request.

## 2016-01-31 NOTE — Telephone Encounter (Signed)
[facility] [phone] called to advise Rx for Armodafinil (NUVIGIL) 250 MG tablet is written for 1 1/2 times daily, maximum dose is 250 mg, wants to make sure Dr. Brett Fairy does want 250 mg 1 1/2 times daily since patient has never been on this medication before.

## 2016-02-09 ENCOUNTER — Encounter: Payer: Self-pay | Admitting: Neurology

## 2016-02-14 NOTE — Telephone Encounter (Signed)
Dear Mrs. Ozaeta,  Your report of parasomnia activity is very concerning. Advil can certainly lead to easier bruising I don't think that the side effect of Xyrem, but parasomnia is likely promoted by Xyrem. I need to ask you to take the first dose when you are guaranteed in bed and to take the second dose was in 2-1/2 or 3 hours afterwards also in bed. Only before you take the second dose can you go to the bathroom or do other activities. Perhaps we need to reduce the second Xyrem dose at night to eliminate parasomnia activity. Please tell me does this t happen in the first dose or after the second dose? May be we reduce the one dose significantly. CD

## 2016-02-16 ENCOUNTER — Telehealth: Payer: Self-pay

## 2016-02-16 NOTE — Telephone Encounter (Signed)
I called pt to make her an appt with Dr. Brett Fairy because of Dr. Brett Fairy request from [place] messages to perhaps adjust a xyrem dose. No answer, left a message asking her to call me back.

## 2016-02-18 NOTE — Telephone Encounter (Signed)
Dear Debra Nelson, The itching is not a side effect of Wellbutrin I have experienced with my patients. Usually, feeling "antsy " or anxious after intake time is reported. I would ask you to stop the wellbutrin and see if the itching is related by withholding the possible offending agent.    Debra Nelson. MD Send by my chart directly to patient.

## 2016-02-22 NOTE — Telephone Encounter (Signed)
Spoke to pt. She has a very complicated schedule. We were finally able to make an appt for 4/12 at 11:30 for her xyrem dose adjustment per Dr. Brett Fairy request.

## 2016-02-22 NOTE — Telephone Encounter (Signed)
Patient returned Debra Nelson's call regarding medication.

## 2016-02-22 NOTE — Telephone Encounter (Signed)
May reach patient at [phone]

## 2016-03-01 ENCOUNTER — Ambulatory Visit (INDEPENDENT_AMBULATORY_CARE_PROVIDER_SITE_OTHER): Payer: 59 | Admitting: Neurology

## 2016-03-01 ENCOUNTER — Encounter: Payer: Self-pay | Admitting: Neurology

## 2016-03-01 VITALS — BP 110/78 | HR 78 | Resp 20 | Ht 61.0 in | Wt 141.0 lb

## 2016-03-01 DIAGNOSIS — F19982 Other psychoactive substance use, unspecified with psychoactive substance-induced sleep disorder: Secondary | ICD-10-CM

## 2016-03-01 DIAGNOSIS — G47411 Narcolepsy with cataplexy: Secondary | ICD-10-CM | POA: Diagnosis not present

## 2016-03-01 DIAGNOSIS — Z5181 Encounter for therapeutic drug level monitoring: Secondary | ICD-10-CM | POA: Diagnosis not present

## 2016-03-01 DIAGNOSIS — F3110 Bipolar disorder, current episode manic without psychotic features, unspecified: Secondary | ICD-10-CM

## 2016-03-01 DIAGNOSIS — Z79899 Other long term (current) drug therapy: Secondary | ICD-10-CM

## 2016-03-01 MED ORDER — XYREM 500 MG/ML PO SOLN: 3000.0000 mg | ORAL | Status: DC

## 2016-03-01 MED ORDER — IMIPRAMINE HCL 25 MG PO TABS: 25.0000 mg | ORAL_TABLET | Freq: Every day | ORAL | Status: DC

## 2016-03-01 NOTE — Patient Instructions (Signed)
Imipramine tablets What is this medicine? IMIPRAMINE (im IP ra meen) is used to treat depression. This medicine can also help children who have a nighttime bed wetting problem. This medicine may be used for other purposes; ask your health care provider or pharmacist if you have questions. What should I tell my health care provider before I take this medicine? They need to know if you have any of these conditions: -an alcohol problem -asthma, difficulty breathing -bipolar disorder or schizophrenia -difficulty passing urine, prostate trouble -fast or irregular heart beat -glaucoma -heart disease or recent heart attack -kidney or liver disease -seizures -stroke -thoughts or plans of suicide, a previous suicide attempt, or family history of suicide attempt -thyroid disease -an unusual or allergic reaction to imipramine, other medicines, foods, dyes, or preservatives -pregnant or trying to get pregnant -breast-feeding How should I use this medicine? Take this medicine by mouth with a glass of water. Follow the directions on the prescription label. Take your doses at regular intervals. Do not take your medicine more often than directed. Do not stop taking this medicine suddenly except upon the advice of your doctor. Stopping this medicine too quickly may cause serious side effects or your condition may worsen. A special MedGuide will be given to you by the pharmacist with each prescription and refill. Be sure to read this information carefully each time. Talk to your pediatrician regarding the use of this medicine in children. Special care may be needed. While this drug may be prescribed for children as young as 6 years for selected conditions, precautions do apply. Overdosage: If you think you have taken too much of this medicine contact a poison control center or emergency room at once. NOTE: This medicine is only for you. Do not share this medicine with others. What if I miss a dose? If you  miss a dose, take it as soon as you can. If it is almost time for your next dose, take only that dose. Do not take double or extra doses. What may interact with this medicine? Do not take this medicine with any of the following medications: -amoxapine -arsenic trioxide -certain medicines used to regulate abnormal heartbeat or to treat other heart conditions -cisapride -cocaine -grepafloxacin -halofantrine -levomethadyl -linezolid -MAOIs like Carbex, Eldepryl, Marplan, Nardil, and Parnate -methylene blue (injected into a vein) -other medicines for mental depression -phenothiazines like perphenazine, thioridazine and chlorpromazine -pimozide -procarbazine -sparfloxacin -St. John's Wort -ziprasidone This medicine may also interact with the following medications: -atropine and related drugs like hyoscyamine, scopolamine, tolterodine and others -barbiturate medicines for inducing sleep or treating seizures like phenobarbital -cimetidine -clonidine -local anesthetics -medicines for high blood pressure -prescription pain medications -seizure or epilepsy medicine such as carbamazepine or phenytoin -stimulants like dexmethylphenidate or methylphenidate -thyroid hormones This list may not describe all possible interactions. Give your health care provider a list of all the medicines, herbs, non-prescription drugs, or dietary supplements you use. Also tell them if you smoke, drink alcohol, or use illegal drugs. Some items may interact with your medicine. What should I watch for while using this medicine? Tell your doctor if your symptoms do not get better or if they get worse. Visit your doctor or health care professional for regular checks on your progress. Because it may take several weeks to see the full effects of this medicine, it is important to continue your treatment as prescribed by your doctor. Patients and their families should watch out for new or worsening thoughts of suicide or  depression. Also  watch out for sudden changes in feelings such as feeling anxious, agitated, panicky, irritable, hostile, aggressive, impulsive, severely restless, overly excited and hyperactive, or not being able to sleep. If this happens, especially at the beginning of treatment or after a change in dose, call your health care professional. Dennis Bast may get drowsy or dizzy. Do not drive, use machinery, or do anything that needs mental alertness until you know how this medicine affects you. Do not stand or sit up quickly, especially if you are an older patient. This reduces the risk of dizzy or fainting spells. Alcohol may interfere with the effect of this medicine. Avoid alcoholic drinks. Do not treat yourself for coughs, colds, or allergies without asking your doctor or health care professional for advice. Some ingredients can increase possible side effects. Your mouth may get dry. Chewing sugarless gum or sucking hard candy, and drinking plenty of water may help. Contact your doctor if the problem does not go away or is severe. This medicine may cause dry eyes and blurred vision. If you wear contact lenses you may feel some discomfort. Lubricating drops may help. See your eye doctor if the problem does not go away or is severe. This medicine can cause constipation. Try to have a bowel movement at least every 2 to 3 days. If you do not have a bowel movement for 3 days, call your doctor or health care professional. This medicine can make you more sensitive to the sun. Keep out of the sun. If you cannot avoid being in the sun, wear protective clothing and use sunscreen. Do not use sun lamps or tanning beds/booths. What side effects may I notice from receiving this medicine? Side effects that you should report to your doctor or health care professional as soon as possible: -allergic reactions like skin rash, itching or hives, swelling of the face, lips, or tongue -abnormal production of milk in females -breast  enlargement in both males and females -breathing problems -confusion, hallucinations -fever with increased sweating -irregular or fast, pounding heartbeat -muscle stiffness or spasms -pain or difficulty passing urine, loss of bladder control -seizures -suicidal thoughts or other mood changes -swelling of the testicles -tingling, pain, or numbness in the feet or hands -yellowing of the eyes or skin Side effects that usually do not require medical attention (report to your doctor or health care professional if they continue or are bothersome): -change in sex drive or performance -constipation or diarrhea -nausea, vomiting -weight gain or loss This list may not describe all possible side effects. Call your doctor for medical advice about side effects. You may report side effects to FDA at 1-800-FDA-1088. Where should I keep my medicine? Keep out of the reach of children. Store at room temperature between 20 and 25 degrees C (68 and 77 degrees F). Keep container tightly closed. Throw away any unused medicine after the expiration date. NOTE: This sheet is a summary. It may not cover all possible information. If you have questions about this medicine, talk to your doctor, pharmacist, or health care provider.    2016, Elsevier/Gold Standard. (2012-03-25 13:56:42)

## 2016-03-01 NOTE — Progress Notes (Signed)
[facility]   Provider:  Larey Nelson, M D  Referring Provider: Josetta Huddle, MD Primary Care Physician:  Debra Screws, MD  Chief Complaint  Patient presents with  . Follow-up    parasomnia on xyrem, possible dose change, rm 10, alone    HPI:  Debra Nelson is a 35 y.o. female , seen here as a revisit from Dr. Inda Nelson for treatment of hypersomnia, HLA positive for Narcolepsy. She just celebrated her 31 th birthday.    03-01-2016 Her husband just was recently evaluated for re-evaluated for sleep apnea and found to have still an AHI of 20 more and some moderate desaturations. He also developed epistaxis after nasal surgery. However what The patient from going back on Xyrem was that her husband suffered a severe kidney stone attack, and she actually at least for why was the caretaker and could not afford being in the deepest of sleep. She has started to sleep walk almost nightly and severely. The discussion as to his continue the Xyrem and he for modafinil only but that would not affect her cataplexy. Currently she has not taken Xyrem for 12 days but is not sleep walking, but her cataplexy is poorly controlled. I would like very much for her to go back on Xyrem but it has to be in a safe environment for she can be watched not vice versa. I will give her Tofranil today to control the cataplexy as long as she is not able to go back on Xyrem and I would refill modafinil.-Nuvigil. This helped a lot in day time.   Chief complaint according to patient :" Recurrent hypersomnia "after the patient's medications were no longer approved by insurance.  She was changed from Nuvigil to Provigil and has not responded well.The patient is an HLA positive narcoleptic with co-morbidities of  Bipolar disorder. This limits the use of stimulants in her treatment.  She was changed from [place] to [place] by Psychiatry, and her anxiety level was lower than it had been in a long time.The patient felt  that her psychiatric condition has been very well controlled with the new LATUDA  Medication- her anxiety level decreased she was much less irritable. Shortly after that  the medication Nuvigil was not longer covered and she is now sleepier and less attentive.  Sleep studies : see media. The patient has been on Xyrem, and lost weight in the process.   Interval 10-19-15, Debra Nelson reports that she had a good but busy Thanksgiving and that she has been the main caretaker of her husband is recovering from her surgery. Overall she has lost more weight since being on Xyrem. Her insurance stopped covering the Nuvigil as I had reported at her last visit she was changed from Nuvigil to Provigil but had not responded as well and especially not to the generic form. She has noted that it takes her longer to fall asleep on Xyrem.  She needs to get back on Nuvigil for daytime sleepiness. Propranolol seems to work less for headache prophylaxis. The recurrence of headaches can also be related to the discontinuation of Nuvigil. We discussed today that the medication can be imported from [place].   Interval history form 01-17-16. Patient feels well, has less daytime sleepiness and no cataplexy. She was on pain medications and could not take XYREM at the same time, restarted XYREM just 3 weeks ago- now sleep walking , Parasomnia.  She dos not drink ETOH, not longer on pain medication.   She  is taking 3 gram first dose and 4.5 second dose.  I will increase to 3.5 and 4.5 gram.  Social history: married, 2 children with ADD. One daughter from her first marriage, and a stepson with a learning disability form her husbands site.  She has had recent labs- elevated sodium , at 109- likely Xyrem related.  Potassium 5.9. Dated 10-27-15.    Review of Systems: 01-17-16 Out of a complete 14 system review, the patient complains of only the following symptoms, and all other reviewed systems are negative.  Fatigue severity score   Again 54 from 41 on xyrem  from 53, before xyrem   depression score PHQ 9 :  How likely are you to doze in the following situations: 0 = not likely, 1 = slight chance, 2 = moderate chance, 3 = high chance  Sitting and Reading? 2 Watching Television?2 Sitting inactive in a public place (theater or meeting)?1 As a passenger in a car for an hour without a break? 2 Lying down in the afternoon when circumstances permit? 3 Sitting and talking to someone?o Sitting quietly after lunch without alcohol? 3 In a car, while stopped for a few minutes in traffic?0   Total = 13 on Xyrem       Social History   Social History  . Marital Status: Married    Spouse Name: Debra Nelson  . Number of Children: 4  . Years of Education: College   Occupational History  .  Other   Social History Main Topics  . Smoking status: Never Smoker   . Smokeless tobacco: Never Used  . Alcohol Use: No  . Drug Use: No  . Sexual Activity: Not on file   Other Topics Concern  . Not on file   Social History Narrative   Patient is married (Debra Nelson) and lives at home with her family.   Patient has 4 children (3 of her own and 1 step son).   Patient has a college education.   Patient drinks 3-4 caffeine drinks daily.   Patient is right-handed.    Family History  Problem Relation Age of Onset  . Cancer Paternal Grandfather     Past Medical History  Diagnosis Date  . Pain in limb   . ADD (attention deficit disorder)   . Chronic anxiety   . Chronic insomnia   . Seasonal allergic rhinitis   . DJD (degenerative joint disease)     Severe of the right knee-Dr Debra Nelson-ortho and Dr Debra Birchwood, PA-preferred pain managment  . GERD (gastroesophageal reflux disease)   . Asthma     episodic  . Dysthymic disorder   . RLS (restless legs syndrome)   . Bipolar disorder with depression (Debra Nelson)     and PTSD  . HA (headache)   . Deviated septum     No past surgical history on file.  Current Outpatient  Prescriptions  Medication Sig Dispense Refill  . albuterol (PROVENTIL HFA;VENTOLIN HFA) 108 (90 BASE) MCG/ACT inhaler Inhale 2 puffs into the lungs every 6 (six) hours as needed for wheezing or shortness of breath.    . Armodafinil (NUVIGIL) 250 MG tablet Take 1.5 tab daily 135 tablet 3  . eletriptan (RELPAX) 40 MG tablet Take 1 tablet (40 mg total) by mouth as needed for migraine or headache. One tablet by mouth at onset of headache. May repeat in 2 hours if headache persists or recurs. 18 tablet 3  . EPIPEN 2-PAK 0.3 MG/0.3ML SOAJ injection as needed.    . Fluticasone  Furoate (ARNUITY ELLIPTA) 100 MCG/ACT AEPB Inhale into the lungs.    . gabapentin (NEURONTIN) 300 MG capsule Take 400 mg by mouth. One capsule in am and early evening. 2 capsules at bedtime    . ibuprofen (ADVIL,MOTRIN) 200 MG tablet Take 200 mg by mouth 3 (three) times daily.    Marland Kitchen lamoTRIgine (LAMICTAL) 100 MG tablet Take 100 mg by mouth 2 (two) times daily.    Marland Kitchen LATUDA 40 MG TABS tablet     . lithium carbonate (LITHOBID) 300 MG CR tablet 300 mg daily.     . montelukast (SINGULAIR) 10 MG tablet Take 10 mg by mouth at bedtime.    . Multiple Vitamin (MULTIVITAMIN) tablet Take 1 tablet by mouth daily.    Marland Kitchen OVER THE COUNTER MEDICATION Biotin, vitamin E, Calcium D, cranberry, vitamin c    . oxymetazoline (QC NASAL RELIEF MOISTURIZING) 0.05 % nasal spray Place 1 spray into both nostrils 2 (two) times daily.    . pantoprazole (PROTONIX) 40 MG tablet 1 tablet 2 (two) times daily.    . promethazine (PHENERGAN) 25 MG tablet Take 1 tablet (25 mg total) by mouth every 6 (six) hours as needed for nausea or vomiting. 90 tablet 3  . XYREM 500 MG/ML SOLN 3.5  grams po for first dose and 4.5 grams for second dose nightly as directed 270 mL 5  . zonisamide (ZONEGRAN) 50 MG capsule Take 2 capsules (100 mg total) by mouth daily. One at night po. 90 capsule 3   No current facility-administered medications for this visit.    Allergies as of  03/01/2016 - Review Complete 03/01/2016  Allergen Reaction Noted  . Klonopin [clonazepam] Anaphylaxis and Nausea Only 12/19/2013  . Phenergan [promethazine hcl]  12/26/2011  . Topamax [topiramate] Nausea And Vomiting 12/19/2013    Vitals: BP 110/78 mmHg  Pulse 78  Resp 20  Ht _0  (1.549 m)  Wt 141 lb (63.957 kg)  BMI 26.66 kg/m2 Last Weight:  Wt Readings from Last 1 Encounters:  03/01/16 141 lb (63.957 kg)   JSR:PRXY mass index is 26.66 kg/(m^2).     Last Height:   Ht Readings from Last 1 Encounters:  03/01/16 _1  (1.549 m)    Physical exam:  General: The patient is awake, alert and appears not in acute distress. The patient is well groomed. Head: Normocephalic, atraumatic. Neck is tense - paraspinal tenderness . Mallampati 2-3 , crowded lower  jaw,  neck circumference:13.75 inches  . Nasal airflow unrestricted , TMJ click not evident . Retrognathia is not seen.  Cardiovascular:  Regular rate and rhythm, without  murmurs or carotid bruit, and without distended neck veins. Respiratory: Lungs are clear to auscultation. Skin:  Without evidence of edema, or rash Trunk: BMI is normal . The patient's posture is erect    Neurologic exam : The patient is awake and alert, oriented to place and time.   Memory subjective described as intact.   Attention span & concentration ability appears normal.  Speech is fluent, without dysarthria, dysphonia or aphasia.  Mood and affect are appropriate, she is conversant .  Cranial nerves: Pupils are equal and briskly reactive to light. Funduscopic exam without  evidence of pallor or edema.  Extraocular movements  in vertical and horizontal planes intact and without nystagmus. Visual fields by finger perimetry are intact. Hearing to finger rub intact. Facial sensation intact to fine touch.Facial motor strength is symmetric and tongue and uvula move midline. Shoulder shrug was symmetrical.  The patient was advised of the nature of the  diagnosed sleep disorder , the treatment options and risks for general a health and wellness arising from not treating the condition.  I spent more than 15 minutes of face to face time with the patient. Greater than 50% of time was spent in counseling and coordination of care. We have discussed the diagnosis and differential and I answered the patient's questions.    Parasomnia activity on XYREM - she noted this only after the first dose, not the second. The patient had been off 2 month of Xyrem - was on pain medication at the same time.  Our main goal is to monitor for sodium and potassium today- parasomnia safety discussed.   For now IMIPRAMINE in form of Tofranil. Nuvigil. Until she is able to return to [facility].   Assessment:  After physical and neurologic examination, review of laboratory studies,  Personal review of imaging studies, reports of other /same  Imaging studies ,   Results of polysomnography/ neurophysiology testing and pre-existing records as far as provided in visit.,  my assessment is:    1) Narcolepsy confirmed by HLA - XYREM and NUVIGIL controlled her hypersomnia and Latuda controlled her anxiety.  With the change to Provigil, this success has been partially undone and the patient is again hypersomnic.   Nuvigil helps.    2) weight has drastically changed up and down, this is likely influenced by her bipolar disorder. Medication side effect influences this, too.  Currently normal BMI after XYREM induced weight loss.  XYREM is on hold, not  discontinued.   3) No cataplexy on XYREM.  Needs Tofranil now.   4) Depression score is decreased- PHQ 9 -8 points.      Plan:  Treatment plan and additional workup : Due to parasomnia activity Xyrem is currently on hold. Her husband needs to be back on his feet before we can stop the medication again I also will give her Tofranil to help control cataplexy be continued Nuvigil in daytime. They can change the dosing of Xyrem to the  first dose being given at 4.546 g at night and the second dose to be 2 or even 3 g less. Sometimes this will help to prevent parasomnias in the morning hours. I would also like for her to have a motion detector at the door of the bedroom so that she may wake up or others are alerted to her activity. I would like to recommend her to lock all doors and especially keeps a car keys in a pocket or a drawer but they are not visible.  Narcolepsy: HLA positive . Bipolar on Latuda : She has had no recent manic spells.  The patient seems to be not longer be in the depths of her depression at this point her pH Q9 was endorsed at 9 points.     Asencion Partridge Crespin Forstrom MD  03/01/2016   CC: Debra Huddle, Md [address] [place], [place] [address]

## 2016-04-18 ENCOUNTER — Ambulatory Visit: Payer: 59 | Admitting: Neurology

## 2016-04-27 ENCOUNTER — Telehealth: Payer: Self-pay | Admitting: Neurology

## 2016-04-27 NOTE — Telephone Encounter (Signed)
Labs faxed to Dr. Caprice Beaver ofc

## 2016-05-02 DIAGNOSIS — H9201 Otalgia, right ear: Secondary | ICD-10-CM | POA: Insufficient documentation

## 2016-05-02 DIAGNOSIS — H6983 Other specified disorders of Eustachian tube, bilateral: Secondary | ICD-10-CM | POA: Insufficient documentation

## 2016-05-02 DIAGNOSIS — J302 Other seasonal allergic rhinitis: Secondary | ICD-10-CM | POA: Insufficient documentation

## 2016-07-18 ENCOUNTER — Other Ambulatory Visit: Payer: Self-pay | Admitting: Neurology

## 2016-07-18 DIAGNOSIS — G4710 Hypersomnia, unspecified: Secondary | ICD-10-CM

## 2016-07-18 DIAGNOSIS — G43009 Migraine without aura, not intractable, without status migrainosus: Secondary | ICD-10-CM

## 2016-07-18 NOTE — Telephone Encounter (Signed)
Patient called to request all prescriptions that Dr.Dohmeier writes for her to be sent to [facility] in 90 day supplies

## 2016-07-18 NOTE — Telephone Encounter (Signed)
I spoke to pt. She asked that her RXs for zonegran, tofranil, and nuvigil be sent to [facility]. She would like the keep the phenergan and relpax at local pharmacies. I will send the prescriptions to [facility].

## 2016-07-19 MED ORDER — IMIPRAMINE HCL 25 MG PO TABS: 25.0000 mg | ORAL_TABLET | Freq: Every day | ORAL | 3 refills | Status: DC

## 2016-07-19 MED ORDER — ARMODAFINIL 250 MG PO TABS: ORAL_TABLET | ORAL | 3 refills | Status: DC

## 2016-07-19 MED ORDER — ZONISAMIDE 50 MG PO CAPS: 50.0000 mg | ORAL_CAPSULE | Freq: Every day | ORAL | 3 refills | Status: DC

## 2016-07-20 NOTE — Telephone Encounter (Signed)
RXs for nuvigil and tofranil faxed to [facility]. Received a receipt of confirmation.

## 2016-07-25 ENCOUNTER — Telehealth: Payer: Self-pay | Admitting: *Deleted

## 2016-07-25 NOTE — Telephone Encounter (Signed)
PA for Nuvigil started on cover my meds.

## 2016-07-26 NOTE — Telephone Encounter (Signed)
Nuvigil PA denied per cover my meds.   Spoke with [facility] and advised her that patient's  Debra Nelson was denied on Cover my meds.. Informed her that Dr Brett Fairy is out of the office until next Wed.  She verbalized understanding and stated she would inform patient and let pt know Dr Brett Fairy is out of the office until next Wed.

## 2016-08-31 ENCOUNTER — Encounter: Payer: Self-pay | Admitting: Neurology

## 2016-08-31 ENCOUNTER — Ambulatory Visit (INDEPENDENT_AMBULATORY_CARE_PROVIDER_SITE_OTHER): Payer: 59 | Admitting: Neurology

## 2016-08-31 VITALS — BP 100/70 | HR 72 | Resp 20 | Ht 61.0 in | Wt 158.0 lb

## 2016-08-31 DIAGNOSIS — G47411 Narcolepsy with cataplexy: Secondary | ICD-10-CM

## 2016-08-31 DIAGNOSIS — Z79899 Other long term (current) drug therapy: Secondary | ICD-10-CM | POA: Diagnosis not present

## 2016-08-31 DIAGNOSIS — G4710 Hypersomnia, unspecified: Secondary | ICD-10-CM

## 2016-08-31 DIAGNOSIS — F3113 Bipolar disorder, current episode manic without psychotic features, severe: Secondary | ICD-10-CM | POA: Diagnosis not present

## 2016-08-31 MED ORDER — ARMODAFINIL 250 MG PO TABS: ORAL_TABLET | ORAL | 3 refills | Status: DC

## 2016-08-31 NOTE — Addendum Note (Signed)
Addended by: Larey Seat on: 08/31/2016 10:27 AM   Modules accepted: Orders

## 2016-08-31 NOTE — Progress Notes (Signed)
[facility]   Provider:  Larey Seat, M D  Referring Provider: Josetta Huddle, MD Primary Care Physician:  Henrine Screws, MD  Chief Complaint  Patient presents with  . Follow-up    sleepiness, xyrem going well    HPI:  08-31-2016  Debra Nelson is a 35 y.o. female , seen here as a revisit from Dr. Inda Merlin for treatment of hypersomnia, HLA positive , diagnosed with Narcolepsy and cataplexy . The patient developed sleepwalking episodes on Xyrem, but she could not find control of her narcolepsy and cataplexy without the medication. For this reason she has restarted apnea monitoring her closely. Her fatigue severity scale today is 47, Epworth sleepiness score is 6 points on Xyrem with armodafinil . The Debra Nelson has now been again covered by her insurance not longer on Tofranil-.Mood overall is good, less variability, not euphoric.Not suicidal - no intent , no plan.   Debra Nelson reports morning headaches frequently now, my concern is that Xyrem may induce hypoventilation that she could be a CO2 retainer. 3 gram of Xyrem has controlled sleepiness and not allowed sleepwaling, but still has vivid dreams.   03-01-2016 The patient was off Xyrem after her husband suffered a severe kidney stone attack, and she was the caretaker and could not afford being in the deepest of sleep.  She has started to sleep walk almost nightly and severely. The discussion as to discontinue the Xyrem and rely modafinil only- but that would not affect her cataplexy. Currently she has not taken Xyrem for 12 days but is not sleep walking, but her cataplexy is poorly controlled. I would like very much for her to go back on Xyrem but it has to be in a safe environment for she can be watched not vice versa. I will give her Tofranil today to control the cataplexy as long as she is not able to go back on Xyrem and I would refill modafinil.-Nuvigil. This helped a lot in day time. ( Due to parasomnia activity Xyrem is  currently on hold. Her husband needs to be back on his feet before we can stop the medication again I also will give her Tofranil to help control cataplexy be continued Nuvigil in daytime. They can change the dosing of Xyrem to the first dose being given at 4.546 g at night and the second dose to be 2 or even 3 g less. Sometimes this will help to prevent parasomnias in the morning hours. I would also like for her to have a motion detector at the door of the bedroom so that she may wake up or others are alerted to her activity. I would like to recommend her to lock all doors and especially keeps a car keys in a pocket or a drawer but they are not visible)  Chief complaint according to patient :" Recurrent hypersomnia "after the patient's medications were no longer approved by insurance.  She was changed from Nuvigil to Provigil and has not responded well.The patient is an HLA positive narcoleptic with co-morbidities of  Bipolar disorder. This limits the use of stimulants in her treatment.  She was changed from [place] to [place] by Psychiatry, and her anxiety level was lower than it had been in a long time.The patient felt that her psychiatric condition has been very well controlled with the new LATUDA  Medication- her anxiety level decreased she was much less irritable. Shortly after that  the medication Nuvigil was not longer covered and she is now sleepier and  less attentive.  Sleep studies : see media. The patient has been on Xyrem, and lost weight in the process.   Interval 10-19-15, Debra Nelson reports that she had a good, but very busy Thanksgiving and that she has been the main caretaker of her husband , who is recovering from her surgery. Overall she has lost more weight since being on Xyrem. Her insurance stopped covering the Nuvigil as I had reported at her last visit she was changed from Nuvigil to Provigil but had not responded as well and especially not to the generic form. She has noted that it  takes her longer to fall asleep on Xyrem.  She needs to get back on Nuvigil for daytime sleepiness. Propranolol seems to work less for headache prophylaxis. The recurrence of headaches can also be related to the discontinuation of Nuvigil. We discussed today that the medication can be imported from [place].   Interval history form 01-17-16. Patient feels well, has less daytime sleepiness and no cataplexy. She was on pain medications and could not take XYREM at the same time, restarted XYREM just 3 weeks ago- now sleep walking , Parasomnia.  She dos not drink ETOH, not longer on pain medication.   She is taking 3 gram first dose and 4.5 second dose.  I will increase to 3.5 and 4.5 gram.  Social history: married, 2 children with ADD. One daughter from her first marriage, and a stepson with a learning disability form her husbands site.  She has had recent labs- elevated sodium , at 109- likely Xyrem related.  Potassium 5.9. Dated 10-27-15.    Review of Systems: 01-17-16 Out of a complete 14 system review, the patient complains of only the following symptoms, and all other reviewed systems are negative.  Fatigue severity score  Again 54 from 41 on xyrem  from 53, before xyrem   depression score PHQ 9 :  How likely are you to doze in the following situations: 0 = not likely, 1 = slight chance, 2 = moderate chance, 3 = high chance  Sitting and Reading? 1 Watching Television?1 Sitting inactive in a public place (theater or meeting)?0 As a passenger in a car for an hour without a break? 0 Lying down in the afternoon when circumstances permit? 2 Sitting and talking to someone?o Sitting quietly after lunch without alcohol? 2 In a car, while stopped for a few minutes in traffic?0   Total = 6 on Xyrem       Social History   Social History  . Marital status: Married    Spouse name: Debra Nelson  . Number of children: 4  . Years of education: College   Occupational History  .  Other   Social  History Main Topics  . Smoking status: Never Smoker  . Smokeless tobacco: Never Used  . Alcohol use No  . Drug use: No  . Sexual activity: Not on file   Other Topics Concern  . Not on file   Social History Narrative   Patient is married (Debra Nelson) and lives at home with her family.   Patient has 4 children (3 of her own and 1 step son).   Patient has a college education.   Patient drinks 3-4 caffeine drinks daily.   Patient is right-handed.    Family History  Problem Relation Age of Onset  . Cancer Paternal Grandfather     Past Medical History:  Diagnosis Date  . ADD (attention deficit disorder)   . Asthma  episodic  . Bipolar disorder with depression ([place])    and PTSD  . Chronic anxiety   . Chronic insomnia   . Deviated septum   . DJD (degenerative joint disease)    Severe of the right knee-Dr Creighton-ortho and Dr Gean Birchwood, PA-preferred pain managment  . Dysthymic disorder   . GERD (gastroesophageal reflux disease)   . HA (headache)   . Pain in limb   . RLS (restless legs syndrome)   . Seasonal allergic rhinitis     No past surgical history on file.  Current Outpatient Prescriptions  Medication Sig Dispense Refill  . albuterol (PROAIR HFA) 108 (90 Base) MCG/ACT inhaler Inhale 2 puffs into the lungs every 6 (six) hours as needed for wheezing or shortness of breath.    . Armodafinil (NUVIGIL) 250 MG tablet Take 1.5 tab daily 135 tablet 3  . cetirizine (ZYRTEC ALLERGY) 10 MG tablet Take 10 mg by mouth daily.    Marland Kitchen eletriptan (RELPAX) 40 MG tablet Take 1 tablet (40 mg total) by mouth as needed for migraine or headache. One tablet by mouth at onset of headache. May repeat in 2 hours if headache persists or recurs. 18 tablet 3  . EPIPEN 2-PAK 0.3 MG/0.3ML SOAJ injection as needed.    . Fluticasone Furoate (ARNUITY ELLIPTA) 100 MCG/ACT AEPB Inhale into the lungs.    . gabapentin (NEURONTIN) 300 MG capsule Take 400 mg by mouth. One capsule in am and early evening.  2 capsules at bedtime    . ibuprofen (ADVIL,MOTRIN) 200 MG tablet Take 200 mg by mouth 3 (three) times daily.    Marland Kitchen imipramine (TOFRANIL) 25 MG tablet Take 1 tablet (25 mg total) by mouth at bedtime. 90 tablet 3  . lamoTRIgine (LAMICTAL) 100 MG tablet Take 100 mg by mouth 2 (two) times daily.    Marland Kitchen LATUDA 60 MG TABS Take 60 mg by mouth daily.    Marland Kitchen lithium carbonate (LITHOBID) 300 MG CR tablet 300 mg 2 (two) times daily.     . montelukast (SINGULAIR) 10 MG tablet Take 10 mg by mouth at bedtime.    . Multiple Vitamin (MULTIVITAMIN) tablet Take 1 tablet by mouth daily.    Marland Kitchen OVER THE COUNTER MEDICATION Biotin, vitamin E, Calcium D, cranberry, vitamin c    . oxymetazoline (QC NASAL RELIEF MOISTURIZING) 0.05 % nasal spray Place 1 spray into both nostrils 2 (two) times daily.    . pantoprazole (PROTONIX) 40 MG tablet 1 tablet 2 (two) times daily.    . promethazine (PHENERGAN) 25 MG tablet Take 1 tablet (25 mg total) by mouth every 6 (six) hours as needed for nausea or vomiting. 90 tablet 3  . XYREM 500 MG/ML SOLN Take 6 mLs (3,000 mg total) by mouth daily after supper. 3.0  grams po for first dose and 3.0 grams for second dose nightly as directed 270 mL 5  . zonisamide (ZONEGRAN) 50 MG capsule Take 1 capsule (50 mg total) by mouth daily. One at night po. 90 capsule 3   No current facility-administered medications for this visit.     Allergies as of 08/31/2016 - Review Complete 08/31/2016  Allergen Reaction Noted  . Klonopin [clonazepam] Anaphylaxis and Nausea Only 12/19/2013  . Phenergan [promethazine hcl]  12/26/2011  . Topamax [topiramate] Nausea And Vomiting 12/19/2013    Vitals: BP 100/70   Pulse 72   Resp 20   Ht '5\' 1"'$  (1.549 m)   Wt 158 lb (71.7 kg)   BMI 29.85 kg/m  Last Weight:  Wt Readings from Last 1 Encounters:  08/31/16 158 lb (71.7 kg)   HGD:JMEQ mass index is 29.85 kg/m.     Last Height:   Ht Readings from Last 1 Encounters:  08/31/16 '5\' 1"'$  (1.549 m)    Physical  exam:  General: The patient is awake, alert and appears not in acute distress. The patient is well groomed. Head: Normocephalic, atraumatic. Neck is tense - paraspinal tenderness . Mallampati 2-3 , crowded lower  jaw,  neck circumference:13.75 inches  . Nasal airflow unrestricted , TMJ click not evident . Retrognathia is not seen.  Cardiovascular:  Regular rate and rhythm, without  murmurs or carotid bruit, and without distended neck veins. Respiratory:  Wheezing, had asthma yesterday  during a field trip.  Skin:  Without evidence of edema, or rash Trunk: BMI is elevated . The patient's posture is erect    Neurologic exam : The patient is awake and alert, oriented to place and time.    Memory subjective described as intact.    Speech is not pressured ,  fluent, without dysarthria,mild  dysphonia.  Mood and affect are appropriate, she is conversant .  Cranial nerves: Pupils are equal and briskly reactive to light. Extraocular movements  in vertical and horizontal planes intact and without nystagmus. Visual fields by finger perimetry are intact. Facial motor strength is symmetric and tongue and uvula move midline. Shoulder shrug was symmetrical.    The patient was advised of the nature of the diagnosed sleep disorder , the treatment options and risks for general a health and wellness arising from not treating the condition.  I spent more than 25 minutes of face to face time with the patient. Greater than 50% of time was spent in counseling and coordination of care. We have discussed the diagnosis and differential and I answered the patient's questions.    Parasomnia activity on XYREM -  Not recurrent on 3 gram twice nightly.  Assessment:  After physical and neurologic examination, review of laboratory studies,  Personal review of imaging studies, reports of other /same  Imaging studies ,   Results of polysomnography/ neurophysiology testing and pre-existing records as far as provided in  visit.,  my assessment is:   1) Narcolepsy confirmed by HLA - XYREM and NUVIGIL controlled her hypersomnia and Latuda controlled her anxiety.  XYREM .   Nuvigil helps.    2) weight has drastically changed up and down, this is likely influenced by her bipolar disorder. Medication side effect influences this, too.  Currently normal BMI after XYREM induced weight loss.    3) No cataplexy on XYREM.  Needs no longer Tofranil now.    Plan:  Treatment plan and additional workup :  Narcolepsy: HLA positive . Bipolar on Latuda : She has had no recent manic spells.  The patient seems to be not longer be in the depths of her depression at this point her pH Q9 was endorsed at 9 points.     Asencion Partridge Alayjah Boehringer MD  08/31/2016   CC: Josetta Huddle, Md [address] [place], [place] [address]

## 2016-08-31 NOTE — Patient Instructions (Signed)
As discussed, Xyrem has to be taken with very mindful caution:   Taking Xyrem correctly is key. This means, take it only when you are fully ready to fall asleep, while in bed and refrain from doing any other activities, even brushing  your teeth after taking your first dose. The second dose will be about 2-1/2-4 hours after his first dose. You can go to the bathroom before your 2nd dose. Take your first dose, when actually IN BED, ready to sleep. No sitting up in bed, NO reading, NO using the cell phone or computer, NO getting up to use the bathroom.  Take care of everything BEFORE sleep time.  Try NOT to skip the second dose as the Xyrem is not going to stay in your system long enough with only one dose. Do not drink alcohol with Xyrem. If you do drink Alcohol, you cannot take your Xyrem doses that night.   Sharisa Toves, MD

## 2016-09-01 LAB — COMPREHENSIVE METABOLIC PANEL
ALT: 16 IU/L (ref 0–32)
AST: 18 IU/L (ref 0–40)
Albumin/Globulin Ratio: 1.9 (ref 1.2–2.2)
Albumin: 4.6 g/dL (ref 3.5–5.5)
Alkaline Phosphatase: 134 IU/L — ABNORMAL HIGH (ref 39–117)
BUN/Creatinine Ratio: 10 (ref 9–23)
BUN: 9 mg/dL (ref 6–20)
Bilirubin Total: 0.3 mg/dL (ref 0.0–1.2)
CO2: 24 mmol/L (ref 18–29)
Calcium: 10.2 mg/dL (ref 8.7–10.2)
Chloride: 102 mmol/L (ref 96–106)
Creatinine, Ser: 0.86 mg/dL (ref 0.57–1.00)
GFR calc Af Amer: 100 mL/min/{1.73_m2} (ref >59)
GFR calc non Af Amer: 87 mL/min/{1.73_m2} (ref >59)
Globulin, Total: 2.4 g/dL (ref 1.5–4.5)
Glucose: 107 mg/dL — ABNORMAL HIGH (ref 65–99)
Potassium: 5.5 mmol/L — ABNORMAL HIGH (ref 3.5–5.2)
Sodium: 141 mmol/L (ref 134–144)
Total Protein: 7 g/dL (ref 6.0–8.5)

## 2016-09-01 LAB — LITHIUM LEVEL: Lithium Lvl: 0.7 mmol/L (ref 0.6–1.2)

## 2016-09-21 ENCOUNTER — Encounter (INDEPENDENT_AMBULATORY_CARE_PROVIDER_SITE_OTHER): Payer: 59 | Admitting: Neurology

## 2016-09-21 DIAGNOSIS — G4710 Hypersomnia, unspecified: Secondary | ICD-10-CM | POA: Diagnosis not present

## 2016-09-21 DIAGNOSIS — G47411 Narcolepsy with cataplexy: Secondary | ICD-10-CM

## 2016-09-21 DIAGNOSIS — Z79899 Other long term (current) drug therapy: Secondary | ICD-10-CM

## 2016-09-28 ENCOUNTER — Telehealth: Payer: Self-pay

## 2016-09-28 NOTE — Telephone Encounter (Signed)
I spoke to pt and advised her that pt's HST was normal per Dr. Brett Fairy and that no intervention is needed. Pt verbalized understanding of results. Pt had no questions at this time but was encouraged to call back if questions arise.

## 2016-10-18 ENCOUNTER — Other Ambulatory Visit: Payer: Self-pay

## 2016-10-18 DIAGNOSIS — F3110 Bipolar disorder, current episode manic without psychotic features, unspecified: Secondary | ICD-10-CM

## 2016-10-18 DIAGNOSIS — G47411 Narcolepsy with cataplexy: Secondary | ICD-10-CM

## 2016-10-18 DIAGNOSIS — Z79899 Other long term (current) drug therapy: Secondary | ICD-10-CM

## 2016-10-18 MED ORDER — XYREM 500 MG/ML PO SOLN: 3000.0000 mg | ORAL | 5 refills | Status: DC

## 2016-10-19 NOTE — Telephone Encounter (Signed)
RX for [facility]rem sent to [facility]. Received a receipt of confirmation.

## 2016-10-26 ENCOUNTER — Ambulatory Visit: Payer: 59 | Admitting: Family Medicine

## 2016-10-27 ENCOUNTER — Ambulatory Visit (INDEPENDENT_AMBULATORY_CARE_PROVIDER_SITE_OTHER): Payer: 59 | Admitting: Family Medicine

## 2016-10-27 ENCOUNTER — Encounter: Payer: Self-pay | Admitting: Family Medicine

## 2016-10-27 VITALS — BP 104/76 | HR 104 | Temp 98.5°F | Ht 62.25 in | Wt 156.8 lb

## 2016-10-27 DIAGNOSIS — G43111 Migraine with aura, intractable, with status migrainosus: Secondary | ICD-10-CM

## 2016-10-27 DIAGNOSIS — K219 Gastro-esophageal reflux disease without esophagitis: Secondary | ICD-10-CM | POA: Insufficient documentation

## 2016-10-27 DIAGNOSIS — F319 Bipolar disorder, unspecified: Secondary | ICD-10-CM

## 2016-10-27 DIAGNOSIS — J4550 Severe persistent asthma, uncomplicated: Secondary | ICD-10-CM

## 2016-10-27 DIAGNOSIS — J45909 Unspecified asthma, uncomplicated: Secondary | ICD-10-CM | POA: Insufficient documentation

## 2016-10-27 MED ORDER — PANTOPRAZOLE SODIUM 40 MG PO TBEC: 40.0000 mg | DELAYED_RELEASE_TABLET | Freq: Every day | ORAL | 3 refills | Status: DC

## 2016-10-27 MED ORDER — LITHIUM CARBONATE ER 300 MG PO TBCR: 300.0000 mg | EXTENDED_RELEASE_TABLET | Freq: Two times a day (BID) | ORAL | 0 refills | Status: DC

## 2016-10-27 MED ORDER — LATUDA 60 MG PO TABS: 60.0000 mg | ORAL_TABLET | Freq: Every day | ORAL | 0 refills | Status: DC

## 2016-10-27 MED ORDER — GABAPENTIN 400 MG PO CAPS: ORAL_CAPSULE | ORAL | 0 refills | Status: DC

## 2016-10-27 MED ORDER — LAMOTRIGINE 100 MG PO TABS: 100.0000 mg | ORAL_TABLET | Freq: Two times a day (BID) | ORAL | 0 refills | Status: DC

## 2016-10-27 NOTE — Progress Notes (Signed)
Pre visit review using our clinic review tool, if applicable. No additional management support is needed unless otherwise documented below in the visit note. 

## 2016-10-27 NOTE — Patient Instructions (Addendum)
Reduce protonix once a day  Refilled medicines for 1 month for psychiatry please call today to get plugged in  Sign release of information at the check out desk for records from [employer]

## 2016-10-27 NOTE — Assessment & Plan Note (Signed)
S: Since age 35. Has had status migrainosus in the past. replax 40mg  as needed. Promethazine goes with that.  A/P: continue current medications, also being followed by neurology

## 2016-10-27 NOTE — Progress Notes (Signed)
Phone: [phone]  Subjective:  Patient presents today to establish care. Chief complaint-noted.   See problem oriented charting  The following were reviewed and entered/updated in epic: Past Medical History:  Diagnosis Date  . Asthma   . Bipolar disorder with depression ([place])    and PTSD  . Chicken pox   . Deviated septum   . DJD (degenerative joint disease)    Severe of the right knee-Dr Creighton-ortho and Dr Gean Birchwood, PA-preferred pain managment  . GERD (gastroesophageal reflux disease)   . Seasonal allergic rhinitis    Patient Active Problem List   Diagnosis Date Noted  . Narcolepsy and cataplexy 03/06/2014    Priority: High  . Bipolar I disorder ([place]) 12/31/2013    Priority: High  . Asthma 10/27/2016    Priority: Medium  . GERD (gastroesophageal reflux disease) 10/27/2016    Priority: Medium  . Intractable migraine with aura with status migrainosus 10/19/2015    Priority: Medium   Past Surgical History:  Procedure Laterality Date  . knee surgery x5     08,11, 12, 13 x2- R knee  . right foot surgery     arthritis/bone spurus. plate placed.   . WISDOM TOOTH EXTRACTION      Family History  Problem Relation Age of Onset  . Mental illness Mother     does not speak to regularly so does not know full history  . Other Father     medical issues, divorce related  . Other Brother     ptsd- fully discharged medically disabled before age 86  . Cancer Paternal Grandfather     Medications- reviewed and updated Current Outpatient Prescriptions  Medication Sig Dispense Refill  . albuterol (PROAIR HFA) 108 (90 Base) MCG/ACT inhaler Inhale 2 puffs into the lungs every 6 (six) hours as needed for wheezing or shortness of breath.    . Armodafinil (NUVIGIL) 250 MG tablet Take 1.5 tab daily 135 tablet 3  . eletriptan (RELPAX) 40 MG tablet Take 1 tablet (40 mg total) by mouth as needed for migraine or headache. One tablet by mouth at onset of headache. May repeat in 2  hours if headache persists or recurs. 18 tablet 3  . EPIPEN 2-PAK 0.3 MG/0.3ML SOAJ injection as needed.    . Fluticasone Furoate (ARNUITY ELLIPTA) 100 MCG/ACT AEPB Inhale into the lungs.    . gabapentin (NEURONTIN) 400 MG capsule One capsule in am and early evening. 2 capsules at bedtime 120 capsule 0  . ibuprofen (ADVIL,MOTRIN) 200 MG tablet Take 200 mg by mouth 3 (three) times daily.    Marland Kitchen imipramine (TOFRANIL) 25 MG tablet Take 25 mg by mouth 3 times/day as needed-between meals & bedtime.    . lamoTRIgine (LAMICTAL) 100 MG tablet Take 1 tablet (100 mg total) by mouth 2 (two) times daily. 60 tablet 0  . LATUDA 60 MG TABS Take 1 tablet (60 mg total) by mouth daily. 30 tablet 0  . levocetirizine (XYZAL) 5 MG tablet Take 5 mg by mouth every evening.    . lithium carbonate (LITHOBID) 300 MG CR tablet Take 1 tablet (300 mg total) by mouth 2 (two) times daily. 60 tablet 0  . mometasone (NASONEX) 50 MCG/ACT nasal spray Place 2 sprays into the nose daily.    . montelukast (SINGULAIR) 10 MG tablet Take 10 mg by mouth at bedtime.    . Multiple Vitamin (MULTIVITAMIN) tablet Take 1 tablet by mouth daily.    Marland Kitchen OVER THE COUNTER MEDICATION Biotin, vitamin E, Calcium  D, cranberry, vitamin c    . pantoprazole (PROTONIX) 40 MG tablet Take 1 tablet (40 mg total) by mouth daily. 90 tablet 3  . promethazine (PHENERGAN) 25 MG tablet Take 1 tablet (25 mg total) by mouth every 6 (six) hours as needed for nausea or vomiting. 90 tablet 3  . XYREM 500 MG/ML SOLN Take 6 mLs (3,000 mg total) by mouth daily after supper. 3.0  grams po for first dose and 3.0 grams for second dose nightly as directed 270 mL 5  . zonisamide (ZONEGRAN) 50 MG capsule Take 1 capsule (50 mg total) by mouth daily. One at night po. 90 capsule 3   No current facility-administered medications for this visit.     Allergies-reviewed and updated Allergies  Allergen Reactions  . Klonopin [Clonazepam] Anaphylaxis and Nausea Only     Insomnia/HA/Fatigued  . Phenergan [Promethazine Hcl]     IV causes muscle tics, may take oral, or give benadryl prior to IV  . Topamax [Topiramate] Nausea And Vomiting    confusion    Social History   Social History  . Marital status: Married    Spouse name: [name]  . Number of children: 4  . Years of education: College   Occupational History  .  Other   Social History Main Topics  . Smoking status: Never Smoker  . Smokeless tobacco: Never Used  . Alcohol use No  . Drug use: No  . Sexual activity: Not on file   Other Topics Concern  . Not on file   Social History Narrative   Patient is married ([name]) and lives at home with her family   4 adults - 2 kids 50% of the time (1 son, 1 daughter). 2 older children (one age 61 from rape, then 37 year old- does not get to see- related to bipolar) Moved in with in laws to help them      Patient has 4 children (3 of her own and 1 step son).    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   Patient is right-handed.    ROS--Full ROS was completed Review of Systems  Constitutional: Negative for chills and fever.  HENT: Negative for hearing loss and tinnitus.   Eyes: Negative for blurred vision and double vision.  Respiratory: Negative for cough and hemoptysis.   Cardiovascular: Negative for chest pain and palpitations.  Gastrointestinal: Negative for heartburn (as long as on PPI) and nausea.  Genitourinary: Negative for dysuria and urgency.  Musculoskeletal: Negative for myalgias and neck pain.  Skin: Negative for itching and rash.  Neurological: Positive for headaches. Negative for dizziness and tingling.  Endo/Heme/Allergies: Negative for polydipsia. Does not bruise/bleed easily.  Psychiatric/Behavioral: Negative for hallucinations, substance abuse and suicidal ideas. The patient is nervous/anxious.    Objective: BP 104/76  (BP Location: Left Arm, Patient Position: Sitting, Cuff Size: Large)   Pulse (!) 104   Temp 98.5 F (36.9 C) (Oral)   Ht 5' 2.25" (1.581 m)   Wt 156 lb 12.8 oz (71.1 kg)   SpO2 97%   BMI 28.45 kg/m  Gen: NAD, resting comfortably HEENT: Mucous membranes are moist. Oropharynx normal. TM normal. Eyes: sclera and lids normal, PERRLA Neck: no thyromegaly, no cervical lymphadenopathy CV: RRR no murmurs rubs or gallops Lungs: CTAB no crackles, wheeze, rhonchi Abdomen: soft/nontender/nondistended/normal bowel sounds. No rebound or guarding.  Ext: no edema Skin: warm, dry, no rash Neuro: 5/5 strength in upper and lower extremities, normal gait, normal reflexes  Assessment/Plan:  GERD (gastroesophageal reflux disease) S: Dr. Inda Merlin had her on prilosec 40mg  BID. --> once a day. Has had ulcers in past. Has been avoiding nsaids.  A/P: we discussed reducing to once a day- she agrees to trial this. Avoid nsaids.    Bipolar I disorder ([place]) S: has been very well controlled on Dr. Inda Merlin had her on prilosec 40mg  BID. --> once a day. Has had ulcers in past avoid. [facility] treated her but they stopped taking her insurance. Has a list of new providers that may accept but needs to call. She ran out of pills yesterday A/P: has been very stable- will get records- but I agreed to 1 month refill to allow her to get plugged into psychiatry.    Intractable migraine with aura with status migrainosus S: Since age 43. Has had status migrainosus in the past. replax 40mg  as needed. Promethazine goes with that.  A/P: continue current medications, also being followed by neurology  Meds ordered this encounter  Medications  . levocetirizine (XYZAL) 5 MG tablet    Sig: Take 5 mg by mouth every evening.  . mometasone (NASONEX) 50 MCG/ACT nasal spray    Sig: Place 2 sprays into the nose daily.  Marland Kitchen imipramine (TOFRANIL) 25 MG tablet    Sig: Take 25 mg by mouth 3 times/day as needed-between meals & bedtime.    . gabapentin (NEURONTIN) 400 MG capsule    Sig: One capsule in am and early evening. 2 capsules at bedtime    Dispense:  120 capsule    Refill:  0  . lamoTRIgine (LAMICTAL) 100 MG tablet    Sig: Take 1 tablet (100 mg total) by mouth 2 (two) times daily.    Dispense:  60 tablet    Refill:  0  . LATUDA 60 MG TABS    Sig: Take 1 tablet (60 mg total) by mouth daily.    Dispense:  30 tablet    Refill:  0  . lithium carbonate (LITHOBID) 300 MG CR tablet    Sig: Take 1 tablet (300 mg total) by mouth 2 (two) times daily.    Dispense:  60 tablet    Refill:  0  . pantoprazole (PROTONIX) 40 MG tablet    Sig: Take 1 tablet (40 mg total) by mouth daily.    Dispense:  90 tablet    Refill:  3   Return precautions advised.  Garret Reddish, MD

## 2016-10-27 NOTE — Assessment & Plan Note (Signed)
S: has been very well controlled on Dr. Inda Merlin had her on prilosec 40mg  BID. --> once a day. Has had ulcers in past avoid. Debra Nelson treated her but they stopped taking her insurance. Has a list of new providers that may accept but needs to call. She ran out of pills yesterday A/P: has been very stable- will get records- but I agreed to 1 month refill to allow her to get plugged into psychiatry.

## 2016-10-27 NOTE — Assessment & Plan Note (Signed)
S: Dr. Inda Merlin had her on prilosec 40mg  BID. --> once a day. Has had ulcers in past. Has been avoiding nsaids.  A/P: we discussed reducing to once a day- she agrees to trial this. Avoid nsaids.

## 2016-11-01 ENCOUNTER — Other Ambulatory Visit: Payer: Self-pay | Admitting: Neurology

## 2016-11-03 ENCOUNTER — Other Ambulatory Visit: Payer: Self-pay | Admitting: Neurology

## 2016-11-07 NOTE — Telephone Encounter (Signed)
Do you want to refill? 

## 2016-11-09 ENCOUNTER — Telehealth: Payer: Self-pay | Admitting: Family Medicine

## 2016-11-09 NOTE — Telephone Encounter (Signed)
Reviewed pscyhiatry notes to be scanned in.   Currently at [facility] as of 06/07/16 on latuda 60mg  qhs, lamictal 100mg  BID, neurontin 400mg  BID and 2 qhs, lithium ER 300mg  BID  Also on nuvigl 250mg  1 q am and 1/2 qnoon, zonisamide 50mg  qhs, albuterol, xyrem 3mg  at 200 and 2300  Changed to bipolar II as listd by psychiatry and also noted personality disorder, migraines, RAD  At that visit stated that she was doing well. . They no longer accept her [place]  Also noted history of benzodiazepine and alcohol abuse from note 08/17/14  Cared for back to 05/2011 at least in records

## 2016-11-27 ENCOUNTER — Encounter: Payer: Self-pay | Admitting: Family Medicine

## 2016-11-28 ENCOUNTER — Other Ambulatory Visit: Payer: Self-pay

## 2016-11-28 MED ORDER — GABAPENTIN 400 MG PO CAPS: ORAL_CAPSULE | ORAL | 2 refills | Status: DC

## 2016-11-28 MED ORDER — LATUDA 60 MG PO TABS: 60.0000 mg | ORAL_TABLET | Freq: Every day | ORAL | 2 refills | Status: DC

## 2016-11-28 MED ORDER — LAMOTRIGINE 100 MG PO TABS: 100.0000 mg | ORAL_TABLET | Freq: Two times a day (BID) | ORAL | 2 refills | Status: DC

## 2016-11-28 MED ORDER — LITHIUM CARBONATE ER 300 MG PO TBCR: 300.0000 mg | EXTENDED_RELEASE_TABLET | Freq: Two times a day (BID) | ORAL | 2 refills | Status: DC

## 2016-12-12 DIAGNOSIS — H903 Sensorineural hearing loss, bilateral: Secondary | ICD-10-CM | POA: Insufficient documentation

## 2016-12-31 ENCOUNTER — Encounter: Payer: Self-pay | Admitting: Family Medicine

## 2016-12-31 ENCOUNTER — Encounter: Payer: Self-pay | Admitting: Neurology

## 2017-01-01 ENCOUNTER — Telehealth: Payer: Self-pay

## 2017-01-01 ENCOUNTER — Other Ambulatory Visit: Payer: Self-pay

## 2017-01-01 DIAGNOSIS — G43009 Migraine without aura, not intractable, without status migrainosus: Secondary | ICD-10-CM

## 2017-01-01 MED ORDER — PROMETHAZINE HCL 25 MG PO TABS: 25.0000 mg | ORAL_TABLET | Freq: Four times a day (QID) | ORAL | 3 refills | Status: DC | PRN

## 2017-01-01 MED ORDER — ZONISAMIDE 50 MG PO CAPS: 50.0000 mg | ORAL_CAPSULE | Freq: Every day | ORAL | 11 refills | Status: DC

## 2017-01-01 NOTE — Telephone Encounter (Signed)
Refill request, ok per Dr. Brett Fairy

## 2017-01-08 ENCOUNTER — Other Ambulatory Visit: Payer: Self-pay

## 2017-01-24 ENCOUNTER — Encounter (HOSPITAL_COMMUNITY): Payer: Self-pay | Admitting: Psychiatry

## 2017-01-24 ENCOUNTER — Ambulatory Visit (HOSPITAL_COMMUNITY): Payer: 59 | Admitting: Psychiatry

## 2017-01-24 ENCOUNTER — Ambulatory Visit (INDEPENDENT_AMBULATORY_CARE_PROVIDER_SITE_OTHER): Payer: 59 | Admitting: Psychiatry

## 2017-01-24 VITALS — BP 122/70 | HR 93 | Ht 61.0 in | Wt 157.2 lb

## 2017-01-24 DIAGNOSIS — Z79899 Other long term (current) drug therapy: Secondary | ICD-10-CM | POA: Diagnosis not present

## 2017-01-24 DIAGNOSIS — F4310 Post-traumatic stress disorder, unspecified: Secondary | ICD-10-CM

## 2017-01-24 DIAGNOSIS — Z818 Family history of other mental and behavioral disorders: Secondary | ICD-10-CM | POA: Diagnosis not present

## 2017-01-24 DIAGNOSIS — F410 Panic disorder [episodic paroxysmal anxiety] without agoraphobia: Secondary | ICD-10-CM

## 2017-01-24 DIAGNOSIS — F319 Bipolar disorder, unspecified: Secondary | ICD-10-CM | POA: Diagnosis not present

## 2017-01-24 MED ORDER — LATUDA 60 MG PO TABS
30.0000 mg | ORAL_TABLET | Freq: Every day | ORAL | 2 refills | Status: DC
Start: 2017-01-24 — End: 2017-02-26

## 2017-01-24 MED ORDER — BREXPIPRAZOLE 1 MG PO TABS: 1.0000 mg | ORAL_TABLET | Freq: Every day | ORAL | 2 refills | Status: DC

## 2017-01-24 MED ORDER — HYDROXYZINE PAMOATE 50 MG PO CAPS: 50.0000 mg | ORAL_CAPSULE | Freq: Three times a day (TID) | ORAL | 1 refills | Status: DC | PRN
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
## 2017-01-24 NOTE — Patient Instructions (Signed)
Starting Today Decrease latuda to 30 mg daily Start Rexulti at 0.5 mg tablet daily   In 1 week Stop Latuda completely Increase Rexulti to 1 mg tablet daily   In 1 more week after that Increase Rexulti to 2 mg daily   Vistaril 50 mg capsule every 8 hours if needed for anxiety/panic.

## 2017-02-26 ENCOUNTER — Ambulatory Visit (INDEPENDENT_AMBULATORY_CARE_PROVIDER_SITE_OTHER): Payer: 59 | Admitting: Psychiatry

## 2017-02-26 ENCOUNTER — Encounter (HOSPITAL_COMMUNITY): Payer: Self-pay | Admitting: Psychiatry

## 2017-02-26 VITALS — BP 110/76 | HR 91 | Ht 61.0 in | Wt 163.4 lb

## 2017-02-26 DIAGNOSIS — Z79899 Other long term (current) drug therapy: Secondary | ICD-10-CM | POA: Diagnosis not present

## 2017-02-26 DIAGNOSIS — F319 Bipolar disorder, unspecified: Secondary | ICD-10-CM | POA: Diagnosis not present

## 2017-02-26 DIAGNOSIS — Z818 Family history of other mental and behavioral disorders: Secondary | ICD-10-CM

## 2017-02-26 DIAGNOSIS — F3181 Bipolar II disorder: Secondary | ICD-10-CM

## 2017-02-26 DIAGNOSIS — F4312 Post-traumatic stress disorder, chronic: Secondary | ICD-10-CM | POA: Diagnosis not present

## 2017-02-26 DIAGNOSIS — F410 Panic disorder [episodic paroxysmal anxiety] without agoraphobia: Secondary | ICD-10-CM | POA: Diagnosis not present

## 2017-02-26 MED ORDER — HYDROXYZINE PAMOATE 50 MG PO CAPS: 50.0000 mg | ORAL_CAPSULE | Freq: Three times a day (TID) | ORAL | 1 refills | Status: DC | PRN

## 2017-02-26 MED ORDER — LAMOTRIGINE 100 MG PO TABS: 100.0000 mg | ORAL_TABLET | Freq: Two times a day (BID) | ORAL | 2 refills | Status: DC

## 2017-02-26 MED ORDER — LITHIUM CARBONATE ER 300 MG PO TBCR: 300.0000 mg | EXTENDED_RELEASE_TABLET | Freq: Two times a day (BID) | ORAL | 2 refills | Status: DC

## 2017-02-26 MED ORDER — BREXPIPRAZOLE 2 MG PO TABS: 2.0000 mg | ORAL_TABLET | Freq: Every day | ORAL | 2 refills | Status: DC

## 2017-02-26 MED ORDER — GABAPENTIN 400 MG PO CAPS: ORAL_CAPSULE | ORAL | 2 refills | Status: DC

## 2017-02-26 NOTE — Patient Instructions (Addendum)
Increase Brexpiprazole to 2 mg daily  I suggest reading the following book:  "becoming attached"
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
## 2017-03-01 ENCOUNTER — Other Ambulatory Visit: Payer: Self-pay | Admitting: Neurology

## 2017-03-01 DIAGNOSIS — G4710 Hypersomnia, unspecified: Secondary | ICD-10-CM

## 2017-03-12 ENCOUNTER — Telehealth: Payer: Self-pay | Admitting: Neurology

## 2017-03-12 DIAGNOSIS — G4710 Hypersomnia, unspecified: Secondary | ICD-10-CM

## 2017-03-12 MED ORDER — ARMODAFINIL 250 MG PO TABS: 250.0000 mg | ORAL_TABLET | Freq: Every day | ORAL | 0 refills | Status: DC

## 2017-03-12 NOTE — Telephone Encounter (Signed)
Rx should have refills left. I called Debra Nelson, they do not have any refills available for her, they state that the Rx expires in 6 months (4/12). Ok to refill?  Patient needs to schedule f/u appt.

## 2017-03-12 NOTE — Telephone Encounter (Signed)
Patient called office in reference to Armodafinil (NUVIGIL) 250 MG tablet.  Patient states she has 2 left of the medication would like to know why the medication was denied to be able to be called in.  [place]

## 2017-03-12 NOTE — Telephone Encounter (Signed)
Patient needs to make FU appt it looks like, last seen over 6 mo ago. Rx placed for Nuvigil 250 mg once daily for 30 d, no refills, patient needs to make appt with Dr. Brett Fairy.

## 2017-03-12 NOTE — Telephone Encounter (Signed)
I spoke to patient and she is aware that we have sent Rx to pharmacy. She will call back when refill is due. I was able to make f/u appt in August.

## 2017-03-12 NOTE — Addendum Note (Signed)
Addended by: Star Age on: 03/12/2017 11:48 AM   Modules accepted: Orders

## 2017-03-13 ENCOUNTER — Encounter: Payer: Self-pay | Admitting: Family Medicine

## 2017-03-13 ENCOUNTER — Ambulatory Visit (INDEPENDENT_AMBULATORY_CARE_PROVIDER_SITE_OTHER): Payer: 59 | Admitting: Family Medicine

## 2017-03-13 VITALS — BP 116/80 | HR 93 | Temp 98.3°F | Wt 156.8 lb

## 2017-03-13 DIAGNOSIS — F3181 Bipolar II disorder: Secondary | ICD-10-CM

## 2017-03-13 DIAGNOSIS — M65312 Trigger thumb, left thumb: Secondary | ICD-10-CM | POA: Diagnosis not present

## 2017-03-13 MED ORDER — IBUPROFEN 800 MG PO TABS: 800.0000 mg | ORAL_TABLET | Freq: Three times a day (TID) | ORAL | 0 refills | Status: DC | PRN

## 2017-03-13 NOTE — Progress Notes (Signed)
Subjective:  Debra Nelson is a 36 y.o. year old very pleasant female patient who presents for/with See problem oriented charting ROS- no erythema around thumb. Some swelling. No pain radiating up the arm. No left arm or neck pain.    Past Medical History-  Patient Active Problem List   Diagnosis Date Noted  . Narcolepsy and cataplexy 03/06/2014    Priority: High  . Bipolar II disorder ([place]) 12/31/2013    Priority: High  . Asthma 10/27/2016    Priority: Medium  . GERD (gastroesophageal reflux disease) 10/27/2016    Priority: Medium  . Intractable migraine with aura with status migrainosus 10/19/2015    Priority: Medium  . Chronic post-traumatic stress disorder (PTSD) 02/26/2017  . Sensorineural hearing loss (SNHL) of both ears 12/12/2016  . Seasonal allergic rhinitis 05/02/2016  . Right ear pain 05/02/2016  . Dysfunction of both eustachian tubes 05/02/2016  . Chondromalacia of knee 03/07/2013    Medications- reviewed and updated Current Outpatient Prescriptions  Medication Sig Dispense Refill  . albuterol (2.5 MG/3ML) 0.083% NEBU 3 mL, albuterol (5 MG/ML) 0.5% NEBU 0.5 mL Inhale into the lungs daily as needed.    Marland Kitchen albuterol (PROAIR HFA) 108 (90 Base) MCG/ACT inhaler Inhale 2 puffs into the lungs every 6 (six) hours as needed for wheezing or shortness of breath.    Marland Kitchen amoxicillin-clavulanate (AUGMENTIN) 500-125 MG tablet Take 1 tablet by mouth 2 (two) times daily.    . Armodafinil (NUVIGIL) 250 MG tablet Take 1 tablet (250 mg total) by mouth daily. 30 tablet 0  . Brexpiprazole (REXULTI) 2 MG TABS Take 2 mg by mouth daily. 30 tablet 2  . eletriptan (RELPAX) 40 MG tablet Take 1 tablet (40 mg total) by mouth as needed for migraine or headache. One tablet by mouth at onset of headache. May repeat in 2 hours if headache persists or recurs. 18 tablet 3  . EPIPEN 2-PAK 0.3 MG/0.3ML SOAJ injection as needed.    . Fluticasone Furoate (ARNUITY ELLIPTA) 100 MCG/ACT AEPB Inhale into the  lungs.    . gabapentin (NEURONTIN) 400 MG capsule One capsule in am and early evening. 2 capsules at bedtime 120 capsule 2  . hydrOXYzine (VISTARIL) 50 MG capsule Take 1 capsule (50 mg total) by mouth 3 (three) times daily as needed. 30 capsule 1  . imipramine (TOFRANIL) 25 MG tablet Take 25 mg by mouth at bedtime. As needed if can't take zyrem    . lamoTRIgine (LAMICTAL) 100 MG tablet Take 1 tablet (100 mg total) by mouth 2 (two) times daily. 60 tablet 2  . levocetirizine (XYZAL) 5 MG tablet Take 5 mg by mouth every evening.    . lithium carbonate (LITHOBID) 300 MG CR tablet Take 1 tablet (300 mg total) by mouth 2 (two) times daily. 60 tablet 2  . mometasone (NASONEX) 50 MCG/ACT nasal spray Place 2 sprays into the nose daily.    . montelukast (SINGULAIR) 10 MG tablet Take 10 mg by mouth at bedtime.    . Multiple Vitamin (MULTIVITAMIN) tablet Take 1 tablet by mouth daily.    Marland Kitchen OVER THE COUNTER MEDICATION Biotin, vitamin E, Calcium D, cranberry, vitamin c    . pantoprazole (PROTONIX) 40 MG tablet Take 1 tablet (40 mg total) by mouth daily. 90 tablet 3  . promethazine (PHENERGAN) 25 MG tablet Take 1 tablet (25 mg total) by mouth every 6 (six) hours as needed for nausea or vomiting. 30 tablet 3  . promethazine-codeine (PHENERGAN WITH CODEINE) 6.25-10 MG/5ML  syrup Take 5 mLs by mouth 2 (two) times daily as needed for cough (Takes 5 - 10 ML 2 times daily PRN).    Marland Kitchen salmeterol (SEREVENT) 50 MCG/DOSE diskus inhaler Inhale 1 puff into the lungs 2 (two) times daily.    Gust Brooms 500 MG/ML SOLN Take 6 mLs (3,000 mg total) by mouth daily after supper. 3.0  grams po for first dose and 3.0 grams for second dose nightly as directed 270 mL 5  . zonisamide (ZONEGRAN) 50 MG capsule Take 1 capsule (50 mg total) by mouth daily. One at night po. 30 capsule 11  . ibuprofen (ADVIL,MOTRIN) 800 MG tablet Take 1 tablet (800 mg total) by mouth every 8 (eight) hours as needed. 30 tablet 0   No current facility-administered  medications for this visit.     Objective: BP 116/80 (BP Location: Right Arm, Patient Position: Sitting, Cuff Size: Large)   Pulse 93   Temp 98.3 F (36.8 C) (Oral)   Wt 156 lb 12.8 oz (71.1 kg)   SpO2 97%   BMI 29.63 kg/m  Gen: NAD, resting comfortably CV: RRR no murmurs rubs or gallops Lungs: CTAB no crackles, wheeze, rhonchi Ext: no edema in arm Left thumb with mild edema but no erythema. There is a palpable click as she flexes thumb then extends it and she has a lot of tenderness proximal to MCP joint. Also mild pain with palpation around IP joint.  Skin: warm, dry, no rash Neuro: intact distal sensation.   Assessment/Plan:  Trigger thumb of left hand - Plan: Ambulatory referral to Orthopedics  Bipolar II disorder ([place]) S: Patient states very active clearning up around the house last Thursday and used her left hand a log more than usually would. She started with pain in the left thumb that evening and noted a clicking in the thumb any time she bent the thumb then tried to extend back out. Has progressed to 5/10 painand up to 8/10 aching pain with movement. Mainly has pain just proximal to MCP joint but some pain near IP joint of left thumb as well. Mild swelling in area Sometimes the finger gets stuck in place and then will pop through and extend and feel better. Has been taking up to 800mg  ibuprofen by taking 4 of the OTC 200mg  tablets.  A/P: this appears to be trigger finger. She will likely need injection with steroids. Will await ortho opinion on x-rays.  I did send in 800mg  ibuprofen but warned of risks along with lithium. We also splinted the thumb- using an adjusted finger splint though could not find my typical straight finger splinting materials. She knows to seek care if new or worsening symptoms until ortho- but will be seen in 2 days by [facility]- grateful for them working her in due to significant pain.   Orders Placed This Encounter  Procedures  . Ambulatory  referral to Orthopedics    Referral Priority:   Routine    Referral Type:   Consultation    Meds ordered this encounter  Medications  . ibuprofen (ADVIL,MOTRIN) 800 MG tablet    Sig: Take 1 tablet (800 mg total) by mouth every 8 (eight) hours as needed.    Dispense:  30 tablet    Refill:  0    Return precautions advised.  Garret Reddish, MD

## 2017-03-13 NOTE — Patient Instructions (Addendum)
We will call you within a week or two about your referral to orthopedics. If you do not hear within 3 weeks, give [place] a call.   I would do ibuprofen 800mg  every 8 hours   Continue icing  Elevated lithium serum concentrations and toxicity characterized by gastrointestinal symptoms, polyuria, muscular weakness, lethargy, and tremor may occur. STOP ibuprofen if a hint of any of these  _____________________________________________________ WE NOW OFFER   [facility] FAST TRACK!!!  SAME DAY Appointments for ACUTE CARE  Such as: Sprains, Injuries, cuts, abrasions, rashes, muscle pain, joint pain, back pain Colds, flu, sore throats, headache, allergies, cough, fever  Ear pain, sinus and eye infections Abdominal pain, nausea, vomiting, diarrhea, upset stomach Animal/insect bites  3 Easy Ways to Schedule: Walk-In Scheduling Call in scheduling Mychart Sign-up: [email or web address]

## 2017-03-13 NOTE — Assessment & Plan Note (Addendum)
Patient finally got in with psychiatry. Seeing Dr. Daron Offer and is thrilled with this transition. She has been pleased with medication adjustments and feels he has a listening ear and provides great care. Very grateful for this transition- I will no longer be providing rx for these medicines.   Current appears- Lamictal 100mg  BID, lithium 300mg  BID ER, latuda 60mg  daily--> rexulti, neurontin 400mg  (1 AM, 1 noon, 2 in the evening)  As noted in trigger thumb section- we reviewed the increased risk of ibuprofen along with lithium and reasons to stop the ibuprofen.

## 2017-03-15 ENCOUNTER — Encounter (INDEPENDENT_AMBULATORY_CARE_PROVIDER_SITE_OTHER): Payer: Self-pay | Admitting: Orthopaedic Surgery

## 2017-03-15 ENCOUNTER — Ambulatory Visit (INDEPENDENT_AMBULATORY_CARE_PROVIDER_SITE_OTHER): Payer: 59 | Admitting: Orthopaedic Surgery

## 2017-03-15 DIAGNOSIS — M65312 Trigger thumb, left thumb: Secondary | ICD-10-CM

## 2017-03-15 MED ORDER — METHYLPREDNISOLONE ACETATE 40 MG/ML IJ SUSP
13.3300 mg | INTRAMUSCULAR | Status: AC | PRN
  Administered 2017-03-15: 13.33 mg

## 2017-03-15 MED ORDER — LIDOCAINE HCL 1 % IJ SOLN
0.3000 mL | INTRAMUSCULAR | Status: AC | PRN
  Administered 2017-03-15: .3 mL

## 2017-03-15 MED ORDER — BUPIVACAINE HCL 0.5 % IJ SOLN
0.3300 mL | INTRAMUSCULAR | Status: AC | PRN
  Administered 2017-03-15: .33 mL

## 2017-03-15 NOTE — Progress Notes (Signed)
Office Visit Note   Patient: Debra Nelson           Date of Birth: 1980-11-30           MRN: [id number] Visit Date: 03/15/2017              Requested by: Marin Olp, MD [address] [place], [place] [address] PCP: Garret Reddish, MD   Assessment & Plan: Visit Diagnoses:  1. Trigger thumb, left thumb     Plan: Patient is tenosynovitis of her left thumb possibly early trigger thumb. Injection was performed today. Patient tolerates well. Follow me as needed.  Follow-Up Instructions: Return if symptoms worsen or fail to improve.   Orders:  No orders of the defined types were placed in this encounter.  No orders of the defined types were placed in this encounter.     Procedures: Hand/UE Inj Date/Time: 03/15/2017 12:48 PM Performed by: Leandrew Koyanagi Authorized by: Leandrew Koyanagi   Consent Given by:  Patient Timeout: prior to procedure the correct patient, procedure, and site was verified   Indications:  Pain Condition: trigger finger   Location:  Thumb (see office note for details) Prep: patient was prepped and draped in usual sterile fashion   Needle Size:  25 G Approach:  Volar Medications:  0.3 mL lidocaine 1 %; 0.33 mL bupivacaine 0.5 %; 13.33 mg methylPREDNISolone acetate 40 MG/ML     Clinical Data: No additional findings.   Subjective: Chief Complaint  Patient presents with  . Left Thumb - Pain    Patient is a 36 year old female with left thumb pain since Thursday. She's been having painful locking and triggering of her left thumb. She has been using her hand extensively recently. She was referred here by her PCP. Pain radiates into the hand occasionally. Denies any injuries.    Review of Systems  Constitutional: Negative.   HENT: Negative.   Eyes: Negative.   Respiratory: Negative.   Cardiovascular: Negative.   Endocrine: Negative.   Musculoskeletal: Negative.   Neurological: Negative.   Hematological: Negative.     Psychiatric/Behavioral: Negative.   All other systems reviewed and are negative.    Objective: Vital Signs: There were no vitals taken for this visit.  Physical Exam  Constitutional: She is oriented to person, place, and time. She appears well-developed and well-nourished.  HENT:  Head: Normocephalic and atraumatic.  Eyes: EOM are normal.  Neck: Neck supple.  Pulmonary/Chest: Effort normal.  Abdominal: Soft.  Neurological: She is alert and oriented to person, place, and time.  Skin: Skin is warm. Capillary refill takes less than 2 seconds.  Psychiatric: She has a normal mood and affect. Her behavior is normal. Judgment and thought content normal.  Nursing note and vitals reviewed.   Ortho Exam Left thumb exam shows painful A1 pulley with no significant triggering. Specialty Comments:  No specialty comments available.  Imaging: No results found.   PMFS History: Patient Active Problem List   Diagnosis Date Noted  . Trigger thumb, left thumb 03/15/2017  . Chronic post-traumatic stress disorder (PTSD) 02/26/2017  . Sensorineural hearing loss (SNHL) of both ears 12/12/2016  . Asthma 10/27/2016  . GERD (gastroesophageal reflux disease) 10/27/2016  . Seasonal allergic rhinitis 05/02/2016  . Right ear pain 05/02/2016  . Dysfunction of both eustachian tubes 05/02/2016  . Intractable migraine with aura with status migrainosus 10/19/2015  . Narcolepsy and cataplexy 03/06/2014  . Bipolar II disorder ([place]) 12/31/2013  . Chondromalacia of knee 03/07/2013  Past Medical History:  Diagnosis Date  . Asthma   . Bipolar disorder with depression ([place])    and PTSD  . Chicken pox   . Deviated septum   . DJD (degenerative joint disease)    Severe of the right knee-Dr Creighton-ortho and Dr Gean Birchwood, PA-preferred pain managment  . GERD (gastroesophageal reflux disease)   . Seasonal allergic rhinitis     Family History  Problem Relation Age of Onset  . Mental illness Mother      does not speak to regularly so does not know full history  . Other Father     medical issues, divorce related  . Other Brother     ptsd- fully discharged medically disabled before age 70  . Cancer Paternal Grandfather     Past Surgical History:  Procedure Laterality Date  . knee surgery x5     08,11, 12, 13 x2- R knee  . right foot surgery     arthritis/bone spurus. plate placed.   . WISDOM TOOTH EXTRACTION     Social History   Occupational History  .  Other   Social History Main Topics  . Smoking status: Never Smoker  . Smokeless tobacco: Never Used  . Alcohol use No  . Drug use: No  . Sexual activity: Yes    Partners: Male    Birth control/ protection: IUD

## 2017-04-02 ENCOUNTER — Telehealth: Payer: Self-pay | Admitting: Neurology

## 2017-04-02 DIAGNOSIS — G4710 Hypersomnia, unspecified: Secondary | ICD-10-CM

## 2017-04-02 MED ORDER — ARMODAFINIL 250 MG PO TABS: 375.0000 mg | ORAL_TABLET | Freq: Every day | ORAL | 0 refills | Status: DC

## 2017-04-02 NOTE — Addendum Note (Signed)
Addended by: Lester [place] A on: 04/02/2017 04:48 PM   Modules accepted: Orders

## 2017-04-02 NOTE — Telephone Encounter (Signed)
Dr. Brett Fairy approved the nuvigil request. RX for nuvigil faxed to Kristopher Oppenheim at [facility]. Received a receipt of confirmation.

## 2017-04-02 NOTE — Telephone Encounter (Signed)
Patient called office in reference to Armodafinil (NUVIGIL) 250 MG tablet.  Patient is needing a new prescription sent to pharmacy to take 1 1/2 tablet daily.  Pharmacy-  [facility]

## 2017-04-02 NOTE — Telephone Encounter (Signed)
Will send to Dr. Brett Fairy for review.

## 2017-04-02 NOTE — Addendum Note (Signed)
Addended by: Larey Seat on: 04/02/2017 05:05 PM   Modules accepted: Orders

## 2017-04-03 ENCOUNTER — Other Ambulatory Visit: Payer: Self-pay

## 2017-04-03 DIAGNOSIS — F3110 Bipolar disorder, current episode manic without psychotic features, unspecified: Secondary | ICD-10-CM

## 2017-04-03 DIAGNOSIS — G47411 Narcolepsy with cataplexy: Secondary | ICD-10-CM

## 2017-04-03 DIAGNOSIS — Z79899 Other long term (current) drug therapy: Secondary | ICD-10-CM

## 2017-04-03 MED ORDER — XYREM 500 MG/ML PO SOLN: 3000.0000 mg | ORAL | 5 refills | Status: DC

## 2017-04-03 NOTE — Telephone Encounter (Signed)
RX for xyrem sent to the [facility] pharmacy.

## 2017-04-05 ENCOUNTER — Encounter: Payer: Self-pay | Admitting: Neurology

## 2017-04-12 ENCOUNTER — Ambulatory Visit (INDEPENDENT_AMBULATORY_CARE_PROVIDER_SITE_OTHER): Payer: 59 | Admitting: Psychiatry

## 2017-04-12 ENCOUNTER — Other Ambulatory Visit (HOSPITAL_COMMUNITY): Payer: Self-pay | Admitting: Psychiatry

## 2017-04-12 VITALS — BP 104/68 | HR 87 | Ht 61.0 in | Wt 152.6 lb

## 2017-04-12 DIAGNOSIS — F4310 Post-traumatic stress disorder, unspecified: Secondary | ICD-10-CM | POA: Diagnosis not present

## 2017-04-12 DIAGNOSIS — Z5181 Encounter for therapeutic drug level monitoring: Secondary | ICD-10-CM

## 2017-04-12 DIAGNOSIS — F3181 Bipolar II disorder: Secondary | ICD-10-CM | POA: Diagnosis not present

## 2017-04-12 DIAGNOSIS — Z818 Family history of other mental and behavioral disorders: Secondary | ICD-10-CM

## 2017-04-12 MED ORDER — BREXPIPRAZOLE 3 MG PO TABS: 3.0000 mg | ORAL_TABLET | Freq: Every day | ORAL | 1 refills | Status: DC

## 2017-04-12 NOTE — Progress Notes (Signed)
BH MD/PA/NP OP Progress Note  04/12/2017 8:56 AM Debra Nelson  MRN:  [id number]  Chief Complaint:  med management  Subjective:  Debra Nelson presents today for psychiatric follow-up. She reports that she has had continued benefit with Rexulti, and reports that she wonders if she might have more benefit with an increase in the dose. She continues to struggle with some down mood at times, irritability, but it is much improved. She reports that she has recently learned she is experiencing some early menopause, which is a genetic trait in her family. She has been started on a hormone patch, which may reduce her bioavailability of Lamictal. She wonders about increasing the dose. We discussed getting the genetic testing to get a baseline idea of what her Lamictal metabolism is in the first place. We agreed to increase atypical antipsychotic for further benefit.  She continues on lithium, and we agreed to get labs today for routine medication management monitoring. We spent time discussing some of the behavioral strategies she uses to deal with anxiety and stress. She reports that she continues to try to be very active with her kids and with her family.  Notably, she has decreased and ultimately discontinued her stimulant, Nuvigil. She reports that she and her PCP realized that the hormone patch and new vigil interact, so she decided she would try not using new vigil, and reports that this has gone pretty well. She will be following up with her neurologist in a couple months to update her.  No acute safety issues or substance abuse. She agrees to the plan as discussed in the follow-up in 10-12 weeks.  Visit Diagnosis:    ICD-9-CM ICD-10-CM   1. Medication monitoring encounter V58.83 Z51.81 Lithium level     Comprehensive Metabolic Panel (CMET)     TSH     T4, free     Vitamin D 25 hydroxy     CBC  2. Bipolar II disorder ([place]) 296.89 F31.81 Brexpiprazole (REXULTI) 3 MG TABS    Past  Psychiatric History: See intake H&P for full details. Reviewed, with no updates at this time.  Past Medical History:  Past Medical History:  Diagnosis Date  . Asthma   . Bipolar disorder with depression ([place])    and PTSD  . Chicken pox   . Deviated septum   . DJD (degenerative joint disease)    Severe of the right knee-Dr Creighton-ortho and Dr Gean Birchwood, PA-preferred pain managment  . GERD (gastroesophageal reflux disease)   . Seasonal allergic rhinitis     Past Surgical History:  Procedure Laterality Date  . knee surgery x5     08,11, 12, 13 x2- R knee  . right foot surgery     arthritis/bone spurus. plate placed.   . WISDOM TOOTH EXTRACTION      Family Psychiatric History: See intake H&P for full details. Reviewed, with no updates at this time.   Family History:  Family History  Problem Relation Age of Onset  . Mental illness Mother        does not speak to regularly so does not know full history  . Other Father        medical issues, divorce related  . Other Brother        ptsd- fully discharged medically disabled before age 48  . Cancer Paternal Grandfather     Social History:  Social History   Social History  . Marital status: Married    Spouse name: [name]  .  Number of children: 4  . Years of education: College   Occupational History  .  Other   Social History Main Topics  . Smoking status: Never Smoker  . Smokeless tobacco: Never Used  . Alcohol use No  . Drug use: No  . Sexual activity: Yes    Partners: Male    Birth control/ protection: IUD   Other Topics Concern  . Not on file   Social History Narrative   Patient is married ([name]) and lives at home with her family   4 adults - 2 kids 50% of the time (1 son, 1 daughter). 2 older children (one age 91 from rape, then 22 year old- does not get to see- related to bipolar) Moved in with in laws to help them      Patient has 4 children (3 of her own and 1 step son).    Patient has a  college education. Psychology and biology. 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   Patient is right-handed.    Allergies:  Allergies  Allergen Reactions  . Klonopin [Clonazepam] Anaphylaxis and Nausea Only    Insomnia/HA/Fatigued  . Other Other (See Comments)  . Phenergan [Promethazine Hcl]     IV causes muscle tics, may take oral, or give benadryl prior to IV  . Promethazine Other (See Comments)    She cannot take IV phenergan unless this it is given with Benadryl.  . Promethazine Hcl Other (See Comments)    IV causes muscle tics, may take oral, or give benadryl prior to IV  . Topamax [Topiramate] Nausea And Vomiting    confusion    Metabolic Disorder Labs: No results found for: HGBA1C, MPG No results found for: PROLACTIN No results found for: CHOL, TRIG, HDL, CHOLHDL, VLDL, LDLCALC   Current Medications: Current Outpatient Prescriptions  Medication Sig Dispense Refill  . albuterol (2.5 MG/3ML) 0.083% NEBU 3 mL, albuterol (5 MG/ML) 0.5% NEBU 0.5 mL Inhale into the lungs daily as needed.    Marland Kitchen albuterol (PROAIR HFA) 108 (90 Base) MCG/ACT inhaler Inhale 2 puffs into the lungs every 6 (six) hours as needed for wheezing or shortness of breath.    Marland Kitchen amoxicillin-clavulanate (AUGMENTIN) 500-125 MG tablet Take 1 tablet by mouth 2 (two) times daily.    . Brexpiprazole (REXULTI) 3 MG TABS Take 3 mg by mouth daily. 90 tablet 1  . eletriptan (RELPAX) 40 MG tablet Take 1 tablet (40 mg total) by mouth as needed for migraine or headache. One tablet by mouth at onset of headache. May repeat in 2 hours if headache persists or recurs. 18 tablet 3  . EPIPEN 2-PAK 0.3 MG/0.3ML SOAJ injection as needed.    . Fluticasone Furoate (ARNUITY ELLIPTA) 100 MCG/ACT AEPB Inhale into the lungs.    . gabapentin (NEURONTIN) 400 MG capsule One capsule in am and early evening. 2 capsules at bedtime (Patient not taking: Reported on 03/15/2017) 120 capsule 2  .  hydrOXYzine (VISTARIL) 50 MG capsule Take 1 capsule (50 mg total) by mouth 3 (three) times daily as needed. 30 capsule 1  . ibuprofen (ADVIL,MOTRIN) 800 MG tablet Take 1 tablet (800 mg total) by mouth every 8 (eight) hours as needed. 30 tablet 0  . imipramine (TOFRANIL) 25 MG tablet Take 25 mg by mouth at bedtime. As needed if can't take zyrem    . lamoTRIgine (LAMICTAL) 100 MG tablet Take 1 tablet (  100 mg total) by mouth 2 (two) times daily. 60 tablet 2  . levocetirizine (XYZAL) 5 MG tablet Take 5 mg by mouth every evening.    . lithium carbonate (LITHOBID) 300 MG CR tablet Take 1 tablet (300 mg total) by mouth 2 (two) times daily. 60 tablet 2  . mometasone (NASONEX) 50 MCG/ACT nasal spray Place 2 sprays into the nose daily.    . montelukast (SINGULAIR) 10 MG tablet Take 10 mg by mouth at bedtime.    . Multiple Vitamin (MULTIVITAMIN) tablet Take 1 tablet by mouth daily.    Marland Kitchen OVER THE COUNTER MEDICATION Biotin, vitamin E, Calcium D, cranberry, vitamin c    . pantoprazole (PROTONIX) 40 MG tablet Take 1 tablet (40 mg total) by mouth daily. 90 tablet 3  . promethazine (PHENERGAN) 25 MG tablet Take 1 tablet (25 mg total) by mouth every 6 (six) hours as needed for nausea or vomiting. 30 tablet 3  . promethazine-codeine (PHENERGAN WITH CODEINE) 6.25-10 MG/5ML syrup Take 5 mLs by mouth 2 (two) times daily as needed for cough (Takes 5 - 10 ML 2 times daily PRN).    Marland Kitchen salmeterol (SEREVENT) 50 MCG/DOSE diskus inhaler Inhale 1 puff into the lungs 2 (two) times daily.    Debra Nelson 150-35 MCG/24HR transdermal patch     . XYREM 500 MG/ML SOLN Take 6 mLs (3,000 mg total) by mouth daily after supper. 3.0  grams po for first dose and 3.0 grams for second dose nightly as directed 270 mL 5  . zonisamide (ZONEGRAN) 50 MG capsule Take 1 capsule (50 mg total) by mouth daily. One at night po. 30 capsule 11   No current facility-administered medications for this visit.     Neurologic: Headache: Negative Seizure:  Negative Paresthesias: Negative  Musculoskeletal: Strength & Muscle Tone: within normal limits Gait & Station: normal Patient leans: N/A  Psychiatric Specialty Exam: Review of Systems  HENT: Positive for hearing loss.   Eyes: Negative.   Respiratory: Negative.   Cardiovascular: Negative.   Gastrointestinal: Negative.   Skin: Negative.   Neurological: Negative.  Negative for seizures.  Psychiatric/Behavioral: Positive for depression. Negative for suicidal ideas. The patient is not nervous/anxious.   All other systems reviewed and are negative.   Blood pressure 104/68, pulse 87, height 5\' 1"  (1.549 m), weight 152 lb 9.6 oz (69.2 kg).Body mass index is 28.83 kg/m.  General Appearance: Casual and Fairly Groomed  Eye Contact:  Good  Speech:  Clear and Coherent  Volume:  Normal  Mood:  Euthymic  Affect:  Appropriate and Congruent  Thought Process:  Goal Directed  Orientation:  Full (Time, Place, and Person)  Thought Content: Logical   Suicidal Thoughts:  No  Homicidal Thoughts:  No  Memory:  Immediate;   Good  Judgement:  Fair  Insight:  Fair  Psychomotor Activity:  Normal  Concentration:  Concentration: Good and Attention Span: Good  Recall:  NA  Fund of Knowledge: Good  Language: Good  Akathisia:  Negative  Handed:  Right  AIMS (if indicated):  na  Assets:  Communication Skills Desire for Improvement Financial Resources/Insurance Housing Intimacy Leisure Time Physical Health Resilience Social Support Talents/Skills Transportation Vocational/Educational  ADL's:  Intact  Cognition: WNL  Sleep:  8-9 hours    Treatment Plan Summary: JEZABELLE CHISOLM is a 36 year old female with chronic PTSD, bipolar disorder, and narcolepsy with cataplexy. She presents today for psychiatric med management follow-up.  Patient has done well with tapering off of Latuda, and  has noted significant improvement of mood and anxiety symptoms with Rexulti.  She remains on lithium and  Lamictal, and we will repeat lithium labs and kidney monitoring today.   She is experiencing symptoms of pre-menopause and is working with PCP on hormone titration. She has discontinued Nuvigil due to interaction with hormone patch.  She reports that her narcolepsy symptoms are well managed with the Xyrem at this time.  1. Medication monitoring encounter   2. Bipolar II disorder ([facility])    Increase Rexulti to 3 mg daily; 90 days prescribed Continue lithium 300 mg twice daily Continue Lamictal 100 mg twice daily Hydroxyzine 50-100 mg every 8 hours as needed for anxiety or irritability Labs as below  Orders Placed This Encounter  Procedures  . Lithium level  . Comprehensive Metabolic Panel (CMET)  . TSH  . T4, free  . Vitamin D 25 hydroxy  . CBC    Narcolepsy with cataplexy  Management per neurology Patient no longer taking nuvigil due to interaction with birth control patch  Aundra Dubin, MD 04/12/2017, 8:56 AM

## 2017-04-17 LAB — T4, FREE: Free T4: 1.45 ng/dL (ref 0.82–1.77)

## 2017-04-17 LAB — COMPREHENSIVE METABOLIC PANEL
ALT: 56 IU/L — ABNORMAL HIGH (ref 0–32)
AST: 47 IU/L — ABNORMAL HIGH (ref 0–40)
Albumin/Globulin Ratio: 2 (ref 1.2–2.2)
Albumin: 4.8 g/dL (ref 3.5–5.5)
Alkaline Phosphatase: 121 IU/L — ABNORMAL HIGH (ref 39–117)
BUN/Creatinine Ratio: 7 — ABNORMAL LOW (ref 9–23)
BUN: 6 mg/dL (ref 6–20)
Bilirubin Total: <0.2 mg/dL (ref 0.0–1.2)
CO2: 20 mmol/L (ref 18–29)
Calcium: 9.9 mg/dL (ref 8.7–10.2)
Chloride: 104 mmol/L (ref 96–106)
Creatinine, Ser: 0.85 mg/dL (ref 0.57–1.00)
GFR calc Af Amer: 101 mL/min/{1.73_m2} (ref >59)
GFR calc non Af Amer: 87 mL/min/{1.73_m2} (ref >59)
Globulin, Total: 2.4 g/dL (ref 1.5–4.5)
Glucose: 80 mg/dL (ref 65–99)
Potassium: 4.9 mmol/L (ref 3.5–5.2)
Sodium: 144 mmol/L (ref 134–144)
Total Protein: 7.2 g/dL (ref 6.0–8.5)

## 2017-04-17 LAB — VITAMIN D 25 HYDROXY (VIT D DEFICIENCY, FRACTURES): Vit D, 25-Hydroxy: 41 ng/mL (ref 30.0–100.0)

## 2017-04-17 LAB — SPECIMEN STATUS REPORT

## 2017-04-17 LAB — AMBIG ABBREV CMP14 DEFAULT

## 2017-04-17 LAB — TSH: TSH: 5.46 u[IU]/mL — ABNORMAL HIGH (ref 0.450–4.500)

## 2017-04-17 LAB — LITHIUM LEVEL: Lithium Lvl: 0.6 mmol/L (ref 0.6–1.2)

## 2017-04-23 ENCOUNTER — Encounter (HOSPITAL_COMMUNITY): Payer: Self-pay | Admitting: Psychiatry

## 2017-04-23 NOTE — Telephone Encounter (Signed)
Hey regan, I see these were drawn but still not resulted.  Does it typically take this long? Thank you!

## 2017-04-24 ENCOUNTER — Other Ambulatory Visit (HOSPITAL_COMMUNITY): Payer: Self-pay | Admitting: Psychiatry

## 2017-04-24 DIAGNOSIS — E7212 Methylenetetrahydrofolate reductase deficiency: Secondary | ICD-10-CM

## 2017-04-24 DIAGNOSIS — E079 Disorder of thyroid, unspecified: Secondary | ICD-10-CM

## 2017-04-24 DIAGNOSIS — Z1589 Genetic susceptibility to other disease: Secondary | ICD-10-CM

## 2017-04-24 MED ORDER — L-METHYLFOLATE 15 MG PO TABS: 15.0000 mg | ORAL_TABLET | Freq: Every day | ORAL | 1 refills | Status: DC

## 2017-05-01 ENCOUNTER — Ambulatory Visit (INDEPENDENT_AMBULATORY_CARE_PROVIDER_SITE_OTHER): Payer: 59

## 2017-05-01 DIAGNOSIS — F3181 Bipolar II disorder: Secondary | ICD-10-CM

## 2017-05-01 DIAGNOSIS — R769 Abnormal immunological finding in serum, unspecified: Secondary | ICD-10-CM | POA: Diagnosis not present

## 2017-05-01 NOTE — Progress Notes (Signed)
Patient came in today asa  Follow up to her last labs that were abnormal. Patient tolerated the procedure well and without complaint.

## 2017-05-02 ENCOUNTER — Other Ambulatory Visit (HOSPITAL_COMMUNITY): Payer: Self-pay | Admitting: Psychiatry

## 2017-05-03 ENCOUNTER — Encounter (HOSPITAL_COMMUNITY): Payer: Self-pay | Admitting: Psychiatry

## 2017-05-03 LAB — C3 AND C4
Complement C3, Serum: 165 mg/dL (ref 82–167)
Complement C4, Serum: 47 mg/dL — ABNORMAL HIGH (ref 14–44)

## 2017-05-03 LAB — THYROID ANTIBODIES
Thyroglobulin Antibody: <1 IU/mL (ref 0.0–0.9)
Thyroperoxidase Ab SerPl-aCnc: 11 IU/mL (ref 0–34)

## 2017-05-03 LAB — ANA W/REFLEX IF POSITIVE: Anti Nuclear Antibody(ANA): NEGATIVE

## 2017-05-03 LAB — SEDIMENTATION RATE: Sed Rate: 2 mm/hr (ref 0–32)

## 2017-05-03 LAB — C-REACTIVE PROTEIN: CRP: 8.8 mg/L — ABNORMAL HIGH (ref 0.0–4.9)

## 2017-05-04 ENCOUNTER — Encounter: Payer: Self-pay | Admitting: Family Medicine

## 2017-05-07 ENCOUNTER — Encounter: Payer: Self-pay | Admitting: Family Medicine

## 2017-05-07 ENCOUNTER — Ambulatory Visit (INDEPENDENT_AMBULATORY_CARE_PROVIDER_SITE_OTHER): Payer: 59 | Admitting: Family Medicine

## 2017-05-07 VITALS — BP 110/76 | HR 81 | Temp 98.7°F | Ht 61.0 in | Wt 154.2 lb

## 2017-05-07 DIAGNOSIS — R946 Abnormal results of thyroid function studies: Secondary | ICD-10-CM

## 2017-05-07 DIAGNOSIS — R7989 Other specified abnormal findings of blood chemistry: Secondary | ICD-10-CM | POA: Insufficient documentation

## 2017-05-07 DIAGNOSIS — R7982 Elevated C-reactive protein (CRP): Secondary | ICD-10-CM

## 2017-05-07 DIAGNOSIS — Z23 Encounter for immunization: Secondary | ICD-10-CM | POA: Diagnosis not present

## 2017-05-07 NOTE — Patient Instructions (Addendum)
Schedule lab visit for 12 weeks to repeat thyroid and CRP level  Can try banana or light amount of Gatorade with leg cramps   Tdap today

## 2017-05-07 NOTE — Assessment & Plan Note (Addendum)
S: extensive lab evalution by Dr. Daron Offer. Noted to have elevated tsh Lab Results  Component Value Date   TSH 5.460 (H) 04/12/2017  as well as elevated CRP though normal ESR Lab Results  Component Value Date   ESRSEDRATE 2 05/02/2017   Patient denies weight gain- in fact has been trying to eat better/quit junk food, drink more water, and also on xyrem (recently started being able to tolerate the 2nd dose) which is decreasing appetite some and has lost 2 lbs in last 2 months. She feels like her energy has been slightly low. No increase in constipation or change in hot or cold intolerance A/P: no clear hypothyroid symptoms- as long as TSH under 10 reasonable to watch unless symptomatic. Agrees to 12 week repeat and also will repeat CRP. If still elevated, may be difficult to determine next steps but per her she states question if change in diet and water intake could have affected levels after her discussions with Dr. Daron Offer.

## 2017-05-07 NOTE — Progress Notes (Signed)
Subjective:  Debra Nelson is a 36 y.o. year old very pleasant female patient who presents for/with See problem oriented charting ROS- no fever, chills, nausea, vomiting, weight gain, change in bowel habits, change in hair or nails   Past Medical History-  Patient Active Problem List   Diagnosis Date Noted  . Chronic post-traumatic stress disorder (PTSD) 02/26/2017    Priority: High  . Narcolepsy and cataplexy 03/06/2014    Priority: High  . Bipolar II disorder ([place]) 12/31/2013    Priority: High  . Asthma 10/27/2016    Priority: Medium  . GERD (gastroesophageal reflux disease) 10/27/2016    Priority: Medium  . Intractable migraine with aura with status migrainosus 10/19/2015    Priority: Medium  . Sensorineural hearing loss (SNHL) of both ears 12/12/2016    Priority: Low  . Seasonal allergic rhinitis 05/02/2016    Priority: Low  . Right ear pain 05/02/2016    Priority: Low  . Dysfunction of both eustachian tubes 05/02/2016    Priority: Low  . Chondromalacia of knee 03/07/2013    Priority: Low  . Elevated TSH 05/07/2017  . Trigger thumb, left thumb 03/15/2017    Medications- reviewed and updated Current Outpatient Prescriptions  Medication Sig Dispense Refill  . albuterol (2.5 MG/3ML) 0.083% NEBU 3 mL, albuterol (5 MG/ML) 0.5% NEBU 0.5 mL Inhale into the lungs daily as needed.    Marland Kitchen albuterol (PROAIR HFA) 108 (90 Base) MCG/ACT inhaler Inhale 2 puffs into the lungs every 6 (six) hours as needed for wheezing or shortness of breath.    . Brexpiprazole (REXULTI) 3 MG TABS Take 3 mg by mouth daily. 90 tablet 1  . eletriptan (RELPAX) 40 MG tablet Take 1 tablet (40 mg total) by mouth as needed for migraine or headache. One tablet by mouth at onset of headache. May repeat in 2 hours if headache persists or recurs. 18 tablet 3  . EPIPEN 2-PAK 0.3 MG/0.3ML SOAJ injection as needed.    . hydrOXYzine (VISTARIL) 50 MG capsule Take 1 capsule (50 mg total) by mouth 3 (three) times daily as  needed. 30 capsule 1  . ibuprofen (ADVIL,MOTRIN) 800 MG tablet Take 1 tablet (800 mg total) by mouth every 8 (eight) hours as needed. 30 tablet 0  . JUNEL 1/20 1-20 MG-MCG tablet Take 1 tablet by mouth daily.    Marland Kitchen L-Methylfolate 15 MG TABS Take 1 tablet (15 mg total) by mouth daily. 90 tablet 1  . lamoTRIgine (LAMICTAL) 100 MG tablet Take 1 tablet (100 mg total) by mouth 2 (two) times daily. 60 tablet 2  . levocetirizine (XYZAL) 5 MG tablet Take 5 mg by mouth every evening.    . lithium carbonate (LITHOBID) 300 MG CR tablet Take 1 tablet (300 mg total) by mouth 2 (two) times daily. 60 tablet 2  . mometasone (NASONEX) 50 MCG/ACT nasal spray Place 2 sprays into the nose daily.    . montelukast (SINGULAIR) 10 MG tablet Take 10 mg by mouth at bedtime.    . Multiple Vitamin (MULTIVITAMIN) tablet Take 1 tablet by mouth daily.    Marland Kitchen OVER THE COUNTER MEDICATION Biotin, vitamin E, Calcium D, cranberry, vitamin c    . pantoprazole (PROTONIX) 40 MG tablet Take 1 tablet (40 mg total) by mouth daily. 90 tablet 3  . promethazine (PHENERGAN) 25 MG tablet Take 1 tablet (25 mg total) by mouth every 6 (six) hours as needed for nausea or vomiting. 30 tablet 3  . XYREM 500 MG/ML SOLN Take  6 mLs (3,000 mg total) by mouth daily after supper. 3.0  grams po for first dose and 3.0 grams for second dose nightly as directed 270 mL 5  . zonisamide (ZONEGRAN) 50 MG capsule Take 1 capsule (50 mg total) by mouth daily. One at night po. 30 capsule 11   No current facility-administered medications for this visit.     Objective: BP 110/76 (BP Location: Left Arm, Patient Position: Sitting, Cuff Size: Large)   Pulse 81   Temp 98.7 F (37.1 C) (Oral)   Ht '5\' 1"'$  (1.549 m)   Wt 154 lb 3.2 oz (69.9 kg)   LMP 04/29/2017   SpO2 98%   BMI 29.14 kg/m  Gen: NAD, resting comfortably No thyromegaly CV: RRR no murmurs rubs or gallops Lungs: CTAB no crackles, wheeze, rhonchi Abdomen: overweight Ext: no edema Skin: warm,  dry  Assessment/Plan:  Elevated TSH, elevated CRP S: extensive lab evalution by Dr. Daron Offer. Noted to have elevated tsh Lab Results  Component Value Date   TSH 5.460 (H) 04/12/2017  as well as elevated CRP though normal ESR Lab Results  Component Value Date   ESRSEDRATE 2 05/02/2017   Patient denies weight gain- in fact has been trying to eat better/quit junk food, drink more water, and also on xyrem (recently started being able to tolerate the 2nd dose) which is decreasing appetite some and has lost 2 lbs in last 2 months. She feels like her energy has been slightly low. No increase in constipation or change in hot or cold intolerance A/P: no clear hypothyroid symptoms- as long as TSH under 10 reasonable to watch unless symptomatic. Agrees to 12 week repeat and also will repeat CRP. If still elevated, may be difficult to determine next steps but per her she states question if change in diet and water intake could have affected levels after her discussions with Dr. Daron Offer.  ________________________________________________________ Also minor issue of Cramping calves and feet. Mainly at night at bed. Also after long walk.  Patient Instructions  Schedule lab visit for 12 weeks to repeat thyroid and CRP level  Can try banana or light amount of Gatorade with leg cramps   Tdap today  12 week labs  Orders Placed This Encounter  Procedures  . Tdap vaccine greater than or equal to 7yo IM  . TSH    Day    Standing Status:   Future    Standing Expiration Date:   05/07/2018  . C-reactive Protein    Standing Status:   Future    Standing Expiration Date:   05/07/2018  . Sedimentation rate    [place]    Standing Status:   Future    Standing Expiration Date:   05/07/2018    Meds ordered this encounter  Medications  . JUNEL 1/20 1-20 MG-MCG tablet    Sig: Take 1 tablet by mouth daily.    Return precautions advised.  Garret Reddish, MD

## 2017-05-30 ENCOUNTER — Other Ambulatory Visit (HOSPITAL_COMMUNITY): Payer: Self-pay | Admitting: Psychiatry

## 2017-05-30 ENCOUNTER — Encounter (HOSPITAL_COMMUNITY): Payer: Self-pay | Admitting: Psychiatry

## 2017-06-08 ENCOUNTER — Other Ambulatory Visit (HOSPITAL_COMMUNITY): Payer: Self-pay | Admitting: Psychiatry

## 2017-06-21 ENCOUNTER — Ambulatory Visit (INDEPENDENT_AMBULATORY_CARE_PROVIDER_SITE_OTHER): Payer: BLUE CROSS/BLUE SHIELD | Admitting: Psychiatry

## 2017-06-21 ENCOUNTER — Encounter (HOSPITAL_COMMUNITY): Payer: Self-pay | Admitting: Psychiatry

## 2017-06-21 DIAGNOSIS — F410 Panic disorder [episodic paroxysmal anxiety] without agoraphobia: Secondary | ICD-10-CM

## 2017-06-21 DIAGNOSIS — F3181 Bipolar II disorder: Secondary | ICD-10-CM

## 2017-06-21 DIAGNOSIS — F4310 Post-traumatic stress disorder, unspecified: Secondary | ICD-10-CM

## 2017-06-21 DIAGNOSIS — F319 Bipolar disorder, unspecified: Secondary | ICD-10-CM

## 2017-06-21 MED ORDER — LAMOTRIGINE 100 MG PO TABS: 100.0000 mg | ORAL_TABLET | Freq: Two times a day (BID) | ORAL | 1 refills | Status: DC

## 2017-06-21 MED ORDER — HYDROXYZINE PAMOATE 50 MG PO CAPS
50.0000 mg | ORAL_CAPSULE | Freq: Three times a day (TID) | ORAL | 1 refills | Status: DC | PRN
Start: 2017-06-21 — End: 2017-11-07

## 2017-06-21 MED ORDER — BREXPIPRAZOLE 2 MG PO TABS: 2.0000 mg | ORAL_TABLET | Freq: Every day | ORAL | 1 refills | Status: DC

## 2017-06-21 MED ORDER — LITHIUM CARBONATE ER 300 MG PO TBCR: 300.0000 mg | EXTENDED_RELEASE_TABLET | Freq: Every day | ORAL | 1 refills | Status: DC

## 2017-06-21 NOTE — Progress Notes (Signed)
BH MD/PA/NP OP Progress Note  06/21/2017 8:54 AM [name]  MRN:  [id number]  Chief Complaint: tired, low energy  Subjective: Debra Nelson 's for med management. She reports that she has continued to struggle with some depressive symptoms, and feels that the increase in [place] made her a bit more tired and down, and wonders about decreasing it to 2 mg. 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  With regard to her lithium dosing, we agreed to reduce it given the elevated TSH, and her ongoing struggle with energy and fatigue, which is certainly complicated by her sleep disorder. We agreed to reduce lithium to 300 mg once a day.  She will follow-up with writer in 3 months to recheck thyroid level and lithium level, in addition to liver studies given the marginally elevated AST and ALT.  She denies any suicidality or safety issues. She reports that things at home in good, and the major issue for her has been the low energy and fatigue. She is continuing to follow up with her primary care provider for further investigation as needed.  Visit Diagnosis:    ICD-10-CM   1. Bipolar II disorder (HCC) F31.81 lithium carbonate (LITHOBID) 300 MG CR tablet    Brexpiprazole (REXULTI) 2 MG TABS    lamoTRIgine (LAMICTAL) 100 MG tablet    DISCONTINUED: lithium carbonate (LITHOBID) 300 MG CR tablet    DISCONTINUED: Brexpiprazole (REXULTI) 2 MG TABS    DISCONTINUED: lamoTRIgine (LAMICTAL) 100 MG tablet  2. Bipolar I disorder (HCC) F31.9 hydrOXYzine (VISTARIL) 50 MG capsule  3. Panic F41.0 hydrOXYzine (VISTARIL) 50 MG capsule    Past Psychiatric History: See intake H&P for full details. Reviewed, with no updates at this time.   Past Medical History:  Past Medical History:  Diagnosis Date  . Asthma   . Bipolar disorder with depression ([place])    and PTSD  . Chicken pox   . Deviated septum   . DJD (degenerative joint disease)    Severe of the right  knee-Dr Creighton-ortho and Dr Gean Birchwood, PA-preferred pain managment  . GERD (gastroesophageal reflux disease)   . Seasonal allergic rhinitis     Past Surgical History:  Procedure Laterality Date  . knee surgery x5     08,11, 12, 13 x2- R knee  . right foot surgery     arthritis/bone spurus. plate placed.   . WISDOM TOOTH EXTRACTION      Family Psychiatric History: See intake H&P for full details. Reviewed, with no updates at this time.   Family History:  Family History  Problem Relation Age of Onset  . Mental illness Mother        does not speak to regularly so does not know full history  . Other Father        medical issues, divorce related  . Other Brother        ptsd- fully discharged medically disabled before age 80  . Cancer Paternal Grandfather     Social History:  Social History   Social History  . Marital status: Married    Spouse name: Debra Nelson  . Number of children: 4  . Years of education: College   Occupational History  .  Other   Social History Main Topics  . Smoking status: Never Smoker  . Smokeless tobacco: Never Used  . Alcohol use No  . Drug use: No  . Sexual activity: Yes  Partners: Male    Birth control/ protection: IUD   Other Topics Concern  . 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 2 older children (one age 75 from rape, then 64 year old- does not get to see- related to bipolar) Moved in with in laws to help them      Patient has 4 children (3 of her own and 1 step son).    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   Patient is right-handed.    Allergies:  Allergies  Allergen Reactions  . Klonopin [Clonazepam] Anaphylaxis and Nausea Only    Insomnia/HA/Fatigued  . Other Other (See Comments)  . Phenergan  [Promethazine Hcl]     IV causes muscle tics, may take oral, or give benadryl prior to IV  . Promethazine Other (See Comments)    She cannot take IV phenergan unless this it is given with Benadryl.  . Promethazine Hcl Other (See Comments)    IV causes muscle tics, may take oral, or give benadryl prior to IV  . Topamax [Topiramate] Nausea And Vomiting    confusion    Metabolic Disorder Labs: No results found for: HGBA1C, MPG No results found for: PROLACTIN No results found for: CHOL, TRIG, HDL, CHOLHDL, VLDL, LDLCALC   Current Medications: Current Outpatient Prescriptions  Medication Sig Dispense Refill  . albuterol (2.5 MG/3ML) 0.083% NEBU 3 mL, albuterol (5 MG/ML) 0.5% NEBU 0.5 mL Inhale into the lungs daily as needed.    Marland Kitchen albuterol (PROAIR HFA) 108 (90 Base) MCG/ACT inhaler Inhale 2 puffs into the lungs every 6 (six) hours as needed for wheezing or shortness of breath.    . eletriptan (RELPAX) 40 MG tablet Take 1 tablet (40 mg total) by mouth as needed for migraine or headache. One tablet by mouth at onset of headache. May repeat in 2 hours if headache persists or recurs. 18 tablet 3  . EPIPEN 2-PAK 0.3 MG/0.3ML SOAJ injection as needed.    . hydrOXYzine (VISTARIL) 50 MG capsule Take 1 capsule (50 mg total) by mouth 3 (three) times daily as needed. 90 capsule 1  . ibuprofen (ADVIL,MOTRIN) 800 MG tablet Take 1 tablet (800 mg total) by mouth every 8 (eight) hours as needed. 30 tablet 0  . JUNEL 1/20 1-20 MG-MCG tablet Take 1 tablet by mouth daily.    Marland Kitchen L-Methylfolate 15 MG TABS Take 1 tablet (15 mg total) by mouth daily. 90 tablet 1  . lamoTRIgine (LAMICTAL) 100 MG tablet Take 1 tablet (100 mg total) by mouth 2 (two) times daily. 120 tablet 1  . levocetirizine (XYZAL) 5 MG tablet Take 5 mg by mouth every evening.    . lithium carbonate (LITHOBID) 300 MG CR tablet Take 1 tablet (300 mg total) by mouth daily. 90 tablet 1  . mometasone (NASONEX) 50 MCG/ACT nasal spray Place 2 sprays into  the nose daily.    . montelukast (SINGULAIR) 10 MG tablet Take 10 mg by mouth at bedtime.    . Multiple Vitamin (MULTIVITAMIN) tablet Take 1 tablet by mouth daily.    Marland Kitchen OVER THE COUNTER MEDICATION Biotin, vitamin E, Calcium D, cranberry, vitamin c    . pantoprazole (PROTONIX) 40 MG tablet Take 1  tablet (40 mg total) by mouth daily. 90 tablet 3  . promethazine (PHENERGAN) 25 MG tablet Take 1 tablet (25 mg total) by mouth every 6 (six) hours as needed for nausea or vomiting. 30 tablet 3  . XYREM 500 MG/ML SOLN Take 6 mLs (3,000 mg total) by mouth daily after supper. 3.0  grams po for first dose and 3.0 grams for second dose nightly as directed 270 mL 5  . zonisamide (ZONEGRAN) 50 MG capsule Take 1 capsule (50 mg total) by mouth daily. One at night po. 30 capsule 11  . Brexpiprazole (REXULTI) 2 MG TABS Take 2 mg by mouth daily. 90 tablet 1   No current facility-administered medications for this visit.     Neurologic: Headache: Negative Seizure: Negative Paresthesias: Negative  Musculoskeletal: Strength & Muscle Tone: within normal limits Gait & Station: normal Patient leans: N/A  Psychiatric Specialty Exam: ROS  Blood pressure 118/72, pulse (!) 102, height 5' 1.5" (1.562 m), weight 137 lb 3.2 oz (62.2 kg).Body mass index is 25.5 kg/m.  General Appearance: Casual and Fairly Groomed  Eye Contact:  Good  Speech:  Clear and Coherent  Volume:  Normal  Mood:  Dysphoric  Affect:  Appropriate and Congruent  Thought Process:  Goal Directed  Orientation:  Full (Time, Place, and Person)  Thought Content: Logical   Suicidal Thoughts:  No  Homicidal Thoughts:  No  Memory:  Immediate;   Good  Judgement:  Fair  Insight:  Fair  Psychomotor Activity:  Normal  Concentration:  Concentration: Fair  Recall:  Good  Fund of Knowledge: Good  Language: Good  Akathisia:  Negative  Handed:  Right  AIMS (if indicated):  0  Assets:  Communication Skills Desire for Improvement Financial  Resources/Insurance Housing Intimacy Leisure Time Physical Health Resilience Social Support Talents/Skills Transportation Vocational/Educational  ADL's:  Intact  Cognition: WNL  Sleep:  6-8 hours     Treatment Plan Summary: Debra Nelson is a 36 year old female with chronic PTSD and bipolar spectrum disorder, in addition to narcolepsy with cataplexy. She has had overall improved mood symptoms with a lower dose of Rexulti, and struggled with decline in mood and energy as we increase the dose. This is also complicated by some of her thyroid abnormalities likely due to lithium.  We agreed to proceed as below and follow-up in 3 months. she does not have any acute safety issues.  1. Bipolar II disorder ([place])   2. Bipolar I disorder ([place])   3. Panic    Decrease  Rexulti to 2 mg daily  Continue lithium at a decreased dose of 300 mg once daily Continue Lamictal 100 mg twice daily Vistaril 50-100 mg every 8 hours as needed for anxiety or panic Follow-up TSH, free T4, LFTs at follow-up in 3 months Return to clinic in 3 months   Aundra Dubin, MD 06/21/2017, 8:54 AM

## 2017-06-25 ENCOUNTER — Encounter (HOSPITAL_COMMUNITY): Payer: Self-pay | Admitting: Psychiatry

## 2017-06-25 ENCOUNTER — Telehealth: Payer: Self-pay | Admitting: Neurology

## 2017-06-25 NOTE — Telephone Encounter (Signed)
fax'ed the information needed.

## 2017-06-25 NOTE — Telephone Encounter (Signed)
Katrina/BC [phone] called request polysonnogram and MSLT to be faxed to [phone]/Attn: [id number](ref #)

## 2017-06-25 NOTE — Telephone Encounter (Signed)
PA completed for Xyrem. Will await to hear back in 3 days. KEY : [id number]

## 2017-06-26 NOTE — Telephone Encounter (Signed)
Correction the name of the lady that called from [facility] was [employer]

## 2017-06-26 NOTE — Telephone Encounter (Signed)
[email or web address]  called to inform authorization of the Xyrem has been denied and for an explanation a call can be made to [phone] xt [phone]

## 2017-06-27 NOTE — Telephone Encounter (Signed)
Received a Denial for the pt medications. O reached out to [employer] and did a peer to peer review and stated that the patient had in fact tried two other medications prior to Xyrem. The other part of denial was in regards to the MSLT test she had done in 2015. It appears the test does not meet criteria for narcolepsy. I stated that Dr Brett Fairy did write that in her impression stating that "the study in proper clinical scenario. May still be consistent with a diagnosis or narcolepsy" This apparently does  not allow them to approve her for this medication because it doesn't indicative it is a diagnosis. I then asked would be sending progress notes help because MD list all the diagnosis and the trailed and failed attempts with other medications. At this [facility] with [employer] says that he will verify with MD there and touch base and see how to proceed forward.

## 2017-07-02 ENCOUNTER — Ambulatory Visit (INDEPENDENT_AMBULATORY_CARE_PROVIDER_SITE_OTHER): Payer: BLUE CROSS/BLUE SHIELD | Admitting: Neurology

## 2017-07-02 ENCOUNTER — Encounter: Payer: Self-pay | Admitting: Neurology

## 2017-07-02 VITALS — BP 113/81 | HR 79 | Ht 61.0 in | Wt 139.0 lb

## 2017-07-02 DIAGNOSIS — F3175 Bipolar disorder, in partial remission, most recent episode depressed: Secondary | ICD-10-CM | POA: Diagnosis not present

## 2017-07-02 DIAGNOSIS — G47411 Narcolepsy with cataplexy: Secondary | ICD-10-CM

## 2017-07-02 DIAGNOSIS — Z79899 Other long term (current) drug therapy: Secondary | ICD-10-CM

## 2017-07-02 DIAGNOSIS — F3110 Bipolar disorder, current episode manic without psychotic features, unspecified: Secondary | ICD-10-CM

## 2017-07-02 MED ORDER — XYREM 500 MG/ML PO SOLN: 3000.0000 mg | ORAL | 5 refills | Status: DC

## 2017-07-02 NOTE — Addendum Note (Signed)
Addended by: Larey Seat on: 07/02/2017 10:09 AM   Modules accepted: Orders

## 2017-07-02 NOTE — Progress Notes (Signed)
[facility]   Provider:  Larey Nelson, M D  Referring Provider: Marin Olp, MD Primary Care Physician:  Debra Olp, MD  Chief Complaint  Patient presents with  . Follow-up    HPI: Today is 07/02/2017,And as the pleasure of seeing [name] in the presence of her young daughter. The patient endorses only 3 points on her Epworth Sleepiness Scale today, 22% on her fatigue score and is doing very well in terms of control of sleepiness, cataplexy and narcolepsy. She has lost weight. She finally got off Soda. She is not employed right now.  Her husband was retired from the police due to DDD.    08-31-2016  Debra Nelson is a 36 y.o. female , seen here as a revisit from Dr. Yong Nelson for treatment of hypersomnia, HLA positive , diagnosed with Narcolepsy and cataplexy . The patient developed sleepwalking episodes on Xyrem, but she could not find control of her narcolepsy and cataplexy without the medication. For this reason she has restarted apnea monitoring her closely. Her fatigue severity scale today is 47, Epworth sleepiness score is 6 points on Xyrem with armodafinil . The Debra Nelson has now been again covered by her insurance not longer on Tofranil-.Mood overall is good, less variability, not euphoric.Not suicidal - no intent , no plan.   Debra Nelson reports morning headaches frequently now, my concern is that Xyrem may induce hypoventilation that she could be a CO2 retainer. 3 gram of Xyrem has controlled sleepiness and not allowed sleepwaling, but still has vivid dreams.   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  She has started to sleep walk almost nightly and severely. The discussion as to discontinue the Xyrem and rely modafinil only- but that would not affect her cataplexy. Currently she has not taken Xyrem for  12 days but is not sleep walking, but her cataplexy is poorly controlled. I would like very much for her to go back on Xyrem but it has to be in a safe environment for she can be watched not vice versa. I will give her Tofranil today to control the cataplexy as long as she is not able to go back on Xyrem and I would refill modafinil.-Nuvigil. This helped a lot in day time. ( Due to parasomnia activity Xyrem is currently on hold. Her husband needs to be back on his feet before we can stop the medication again I also will give her Tofranil to help control cataplexy be continued Nuvigil in daytime. They can change the dosing of Xyrem to the first dose being given at 4.5 g at night and the second dose to be 2 or even 3 g less. Sometimes this will help to prevent parasomnias in the morning hours. I would also like for her to have a motion detector at the door of the bedroom so that she may wake up or others are alerted to her activity. I would like to recommend her to lock all doors and especially keeps a car keys in a pocket or a drawer but they are not visible)  Chief complaint according to patient :" Recurrent hypersomnia "after the patient's medications were no longer approved by insurance.  She was changed from Nuvigil to Provigil and has not responded well.The patient is an HLA positive narcoleptic with co-morbidities of  Bipolar disorder. This  limits the use of stimulants in her treatment.  She was changed from Debra Nelson to Debra Nelson by Psychiatry, and her anxiety level was lower than it had been in a long time.The patient felt that her psychiatric condition has been very well controlled with the new LATUDA  Medication- her anxiety level decreased she was much less irritable. Shortly after that  the medication Nuvigil was not longer covered and she is now sleepier and less attentive.  Sleep studies : see media. The patient has been on Xyrem, and lost weight in the process.   Interval 10-19-15, Debra Nelson reports  that she had a good, but very busy Thanksgiving and that she has been the main caretaker of her husband , who is recovering from her surgery. Overall she has lost more weight since being on Xyrem. Her insurance stopped covering the Nuvigil as I had reported at her last visit she was changed from Nuvigil to Provigil but had not responded as well and especially not to the generic form. She has noted that it takes her longer to fall asleep on Xyrem.  She needs to get back on Nuvigil for daytime sleepiness. Propranolol seems to work less for headache prophylaxis. The recurrence of headaches can also be related to the discontinuation of Nuvigil. We discussed today that the medication can be imported from [place].   Interval history form 01-17-16. Patient feels well, has less daytime sleepiness and no cataplexy. She was on pain medications and could not take XYREM at the same time, restarted XYREM just 3 weeks ago- now sleep walking , Parasomnia.  She dos not drink ETOH, not longer on pain medication.   She is taking 3 gram first dose and 4.5 second dose.  I will increase to 3.5 and 4.5 gram.  Social history: married, 2 children with ADD. One daughter from her first marriage, and a stepson with a learning disability form her husbands site.  She has had recent labs- elevated sodium , at 109- likely Xyrem related.  Potassium 5.9. Dated 10-27-15.    Review of Systems: 01-17-16 Out of a complete 14 system review, the patient complains of only the following symptoms, and all other reviewed systems are negative.  Fatigue severity score  Again 54 from 41 on xyrem  from 53, before xyrem     depression score PHQ 9 :  How likely are you to doze in the following situations: 0 = not likely, 1 = slight chance, 2 = moderate chance, 3 = high chance  Sitting and Reading? 1 Watching Television?1 Sitting inactive in a public place (theater or meeting)?0 As a passenger in a car for an hour without a break? 0 Lying down  in the afternoon when circumstances permit? 1 Sitting and talking to someone?0 Sitting quietly after lunch without alcohol? 0 In a car, while stopped for a few minutes in traffic?0   Total = 3 on Xyrem    No sleep walking-  Reduced dreams, no cataplexy.   Depression score 3/ 15 - the patient is sad about having to give up her dog to which her son is very attached.   Social History   Social History  . Marital status: Married    Spouse name: [name]  . Number of children: 4  . Years of education: College   Occupational History  .  Other   Social History Main Topics  . Smoking status: Never Smoker  . Smokeless tobacco: Never Used  . Alcohol use No  . Drug use:  No  . Sexual activity: Yes    Partners: Male    Birth control/ protection: IUD   Other Topics Concern  . Not on file   Social History Narrative   Patient is married ([name]) and lives at home with her family   4 adults - 2 kids 50% of the time (1 son, 1 daughter). 2 older children (one age 6 from rape, then 85 year old- does not get to see- related to bipolar) Moved in with in laws to help them      Patient has 4 children (3 of her own and 1 step son).    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   Patient is right-handed.    Family History  Problem Relation Age of Onset  . Mental illness Mother        does not speak to regularly so does not know full history  . Other Father        medical issues, divorce related  . Other Brother        ptsd- fully discharged medically disabled before age 20  . Cancer Paternal Grandfather     Past Medical History:  Diagnosis Date  . Asthma   . Bipolar disorder with depression ([place])    and PTSD  . Chicken pox   . Deviated septum   . DJD (degenerative joint disease)    Severe of the right knee-Dr Creighton-ortho and Dr Gean Birchwood, PA-preferred pain managment  .  GERD (gastroesophageal reflux disease)   . Seasonal allergic rhinitis     Past Surgical History:  Procedure Laterality Date  . knee surgery x5     08,11, 12, 13 x2- R knee  . right foot surgery     arthritis/bone spurus. plate placed.   . WISDOM TOOTH EXTRACTION      Current Outpatient Prescriptions  Medication Sig Dispense Refill  . albuterol (2.5 MG/3ML) 0.083% NEBU 3 mL, albuterol (5 MG/ML) 0.5% NEBU 0.5 mL Inhale into the lungs daily as needed.    Marland Kitchen albuterol (PROAIR HFA) 108 (90 Base) MCG/ACT inhaler Inhale 2 puffs into the lungs every 6 (six) hours as needed for wheezing or shortness of breath.    . Brexpiprazole (REXULTI) 2 MG TABS Take 2 mg by mouth daily. 90 tablet 1  . eletriptan (RELPAX) 40 MG tablet Take 1 tablet (40 mg total) by mouth as needed for migraine or headache. One tablet by mouth at onset of headache. May repeat in 2 hours if headache persists or recurs. 18 tablet 3  . EPIPEN 2-PAK 0.3 MG/0.3ML SOAJ injection as needed.    . hydrOXYzine (VISTARIL) 50 MG capsule Take 1 capsule (50 mg total) by mouth 3 (three) times daily as needed. 90 capsule 1  . JUNEL 1/20 1-20 MG-MCG tablet Take 1 tablet by mouth daily.    Marland Kitchen L-Methylfolate 15 MG TABS Take 1 tablet (15 mg total) by mouth daily. 90 tablet 1  . lamoTRIgine (LAMICTAL) 100 MG tablet Take 1 tablet (100 mg total) by mouth 2 (two) times daily. 120 tablet 1  . lithium carbonate (LITHOBID) 300 MG CR tablet Take 1 tablet (300 mg total) by mouth daily. 90 tablet 1  . montelukast (SINGULAIR) 10 MG tablet Take 10 mg by mouth at bedtime.    . Multiple Vitamin (MULTIVITAMIN) tablet Take 1 tablet by mouth daily.    Marland Kitchen  OVER THE COUNTER MEDICATION Biotin, vitamin E, Calcium D, cranberry, vitamin c    . promethazine (PHENERGAN) 25 MG tablet Take 1 tablet (25 mg total) by mouth every 6 (six) hours as needed for nausea or vomiting. 30 tablet 3  . XYREM 500 MG/ML SOLN Take 6 mLs (3,000 mg total) by mouth daily after supper. 3.0  grams  po for first dose and 3.0 grams for second dose nightly as directed 270 mL 5  . zonisamide (ZONEGRAN) 50 MG capsule Take 1 capsule (50 mg total) by mouth daily. One at night po. 30 capsule 11   No current facility-administered medications for this visit.     Allergies as of 07/02/2017 - Review Complete 07/02/2017  Allergen Reaction Noted  . Klonopin [clonazepam] Anaphylaxis and Nausea Only 12/19/2013  . Other Other (See Comments) 03/07/2013  . Phenergan [promethazine hcl]  12/26/2011  . Promethazine Other (See Comments) 03/07/2013  . Promethazine hcl Other (See Comments) 12/26/2011  . Topamax [topiramate] Nausea And Vomiting 12/19/2013    Vitals: BP 113/81   Pulse 79   Ht '5\' 1"'$  (1.549 m)   Wt 139 lb (63 kg)   BMI 26.26 kg/m  Last Weight:  Wt Readings from Last 1 Encounters:  07/02/17 139 lb (63 kg)   OZD:GUYQ mass index is 26.26 kg/m.     Last Height:   Ht Readings from Last 1 Encounters:  07/02/17 '5\' 1"'$  (1.549 m)    Physical exam:  General: The patient is awake, alert and appears relaxed. The patient is well groomed. Head: Normocephalic, atraumatic. Neck  - paraspinal tenderness .  Mallampati 2- crowded lower jaw,  neck circumference:14 inches  . Nasal airflow unrestricted , TMJ click not evident . Retrognathia is not seen.  Cardiovascular:  Regular rate and rhythm, without  murmurs or carotid bruit, and without distended neck veins. Respiratory:  clear  Skin:  Without evidence of edema, or rash Trunk: BMI is elevated . The patient's posture is erect    Neurologic exam : The patient is awake and alert, oriented to place and time.    Memory subjective described as intact.  Speech is not pressured,fluent, without dysarthria. Mood and affect are appropriate, she is conversant .  Cranial nerves: Pupils are equal and briskly reactive to light. Extraocular movements  in vertical and horizontal planes intact and without nystagmus. Visual fields by finger perimetry are  intact. Facial motor strength is symmetric and tongue and uvula move midline. Shoulder shrug was symmetrical.   The patient was advised of the nature of the diagnosed sleep disorder , the treatment options and risks for general a health and wellness arising from not treating the condition.  I spent more than 15 minutes of face to face time with the patient. Greater than 50% of time was spent in counseling and coordination of care. We have discussed the diagnosis and differential and I answered the patient's questions.    Parasomnia/ sleep walking  activity on XYREM has ceased current on 3 gram twice nightly.   Assessment:  After physical and neurologic examination, review of laboratory studies,  Personal review of imaging studies, reports of other /same  Imaging studies ,   Results of polysomnography/ neurophysiology testing and pre-existing records as far as provided in visit.,  my assessment is:   1) Narcolepsy confirmed by HLA -  XYREM and NUVIGIL controlled her hypersomnia and Latuda controlled her anxiety.  XYREM has alleviated her cataplexy  .  No need for tofranil.  Nuvigil  helps with EDS, as needed, no recent need.Marland Kitchen    2) weight has drastically changed up and down, this is likely influenced by her bipolar disorder. Medication side effect influences this, too.  Currently normal BMI after XYREM induced weight loss.    Plan:  Treatment plan and additional workup :  Narcolepsy: HLA positive .  Bipolar on Latuda : She has had no recent manic spells.  The patient seems to be not longer be in the depths of her depression at this point her pH Q9 was endorsed at 9 points. I will refill XYREM. The patient's insurance changed to [facility].  Asencion Partridge Neeko Pharo MD  07/02/2017   CC: Debra Olp, Md [address] [place], [place] [address]

## 2017-07-02 NOTE — Patient Instructions (Signed)
As discussed, Xyrem has to be taken with very mindful caution: Taking Xyrem correctly is key. This means, take it only when you are fully ready to fall asleep, while in bed and refrain from doing any other activities, even brushing  your teeth after taking your first dose. The second dose will be about 2-1/2-4 hours after his first dose. You can go to the bathroom before your 2nd dose. Take your first dose, when actually IN BED, ready to sleep. No sitting up in bed, NO reading, NO using the cell phone or computer, NO getting up to use the bathroom. Take care of everything BEFORE sleep time. Try NOT to skip the second dose as the Xyrem is not going to stay in your system long enough with only one dose. Do not drink alcohol with Xyrem. If you do drink Alcohol, you cannot take your Xyrem doses that night.   

## 2017-07-03 LAB — COMPREHENSIVE METABOLIC PANEL
ALT: 20 IU/L (ref 0–32)
AST: 14 IU/L (ref 0–40)
Albumin/Globulin Ratio: 2.4 — ABNORMAL HIGH (ref 1.2–2.2)
Albumin: 4.6 g/dL (ref 3.5–5.5)
Alkaline Phosphatase: 70 IU/L (ref 39–117)
BUN/Creatinine Ratio: 6 — ABNORMAL LOW (ref 9–23)
BUN: 5 mg/dL — ABNORMAL LOW (ref 6–20)
Bilirubin Total: 0.4 mg/dL (ref 0.0–1.2)
CO2: 26 mmol/L (ref 20–29)
Calcium: 10.6 mg/dL — ABNORMAL HIGH (ref 8.7–10.2)
Chloride: 102 mmol/L (ref 96–106)
Creatinine, Ser: 0.82 mg/dL (ref 0.57–1.00)
GFR calc Af Amer: 105 mL/min/{1.73_m2} (ref >59)
GFR calc non Af Amer: 91 mL/min/{1.73_m2} (ref >59)
Globulin, Total: 1.9 g/dL (ref 1.5–4.5)
Glucose: 85 mg/dL (ref 65–99)
Potassium: 3.9 mmol/L (ref 3.5–5.2)
Sodium: 142 mmol/L (ref 134–144)
Total Protein: 6.5 g/dL (ref 6.0–8.5)

## 2017-07-04 ENCOUNTER — Telehealth: Payer: Self-pay | Admitting: Neurology

## 2017-07-04 NOTE — Telephone Encounter (Signed)
Called informed patient that lab work looked good. I also informed patient that we are still working on the appeals process for her Xyrem. Pt verbalized understanding and had no further questions.

## 2017-07-04 NOTE — Telephone Encounter (Signed)
-----   Message from Larey Seat, MD sent at 07/03/2017  5:38 PM EDT ----- Well hydrated patient on lithium, renal clearance is unimpaired.

## 2017-07-09 ENCOUNTER — Telehealth: Payer: Self-pay | Admitting: Neurology

## 2017-07-09 ENCOUNTER — Encounter (HOSPITAL_COMMUNITY): Payer: Self-pay | Admitting: Psychiatry

## 2017-07-09 NOTE — Telephone Encounter (Signed)
[email or web address] calling re: the denial of the XYREM 500 MG/ML SOLN, wanting to know how Dr Brett Fairy would like to proceed.  Please call back to [phone] option 2 then option 5 ours are 7-8 central Standard

## 2017-07-19 NOTE — Telephone Encounter (Signed)
Sent an appeal for the medication on 06/27/2017. Awaiting for the [employer] to approve or deny. At that point we will address next step.

## 2017-07-19 NOTE — Telephone Encounter (Signed)
Received a call from [place] with [employer]. She thought the medication had been denied twice. I explained that I sent the appeal on 06/27/2017. Awaiting a response at this time.

## 2017-07-24 NOTE — Telephone Encounter (Signed)
PA for Xyrem has been approved for the patient. The reference number is [facility] and approved from 07/24/2017- 11/19/2038.

## 2017-07-26 ENCOUNTER — Encounter (HOSPITAL_COMMUNITY): Payer: Self-pay | Admitting: Psychiatry

## 2017-07-26 ENCOUNTER — Ambulatory Visit (INDEPENDENT_AMBULATORY_CARE_PROVIDER_SITE_OTHER): Payer: BLUE CROSS/BLUE SHIELD | Admitting: Psychiatry

## 2017-07-26 VITALS — BP 102/68 | HR 92 | Ht 61.5 in | Wt 134.0 lb

## 2017-07-26 DIAGNOSIS — G47419 Narcolepsy without cataplexy: Secondary | ICD-10-CM

## 2017-07-26 DIAGNOSIS — F4312 Post-traumatic stress disorder, chronic: Secondary | ICD-10-CM

## 2017-07-26 DIAGNOSIS — Z818 Family history of other mental and behavioral disorders: Secondary | ICD-10-CM

## 2017-07-26 DIAGNOSIS — F3181 Bipolar II disorder: Secondary | ICD-10-CM

## 2017-07-26 MED ORDER — SERTRALINE HCL 25 MG PO TABS: 25.0000 mg | ORAL_TABLET | Freq: Every day | ORAL | 1 refills | Status: DC

## 2017-07-26 NOTE — Patient Instructions (Signed)
Decrease rexulti to 1/2 tablet for 1 week, then stop  START Zoloft 25 mg daily for 1 week, then increase to 50 mg daily

## 2017-07-26 NOTE — Progress Notes (Signed)
[place] MD/PA/NP OP Progress Note  07/26/2017 1:57 PM Debra Nelson  MRN:  [id number]  Chief Complaint:  Chief Complaint    Follow-up     HPI: Debra Nelson presents today for med management for anxiety and mood symptoms. She reports that she feels like her depression and anxiety have not been any better with Rexulti. We spent time reviewing the trajectory with Rexulti, and we agreed that it is not provided her with any significant benefit. We agreed to start low-dose SSRI given her symptoms of the excessive worry, rumination, anxiety. She reports that she worries about anything and everything, feels tense and nervous, and has difficulty going into public places due to social anxiety. She denies any acute safety issues, reports that she exercises regularly, and has been taking care of herself in terms of her health. She continues to work on her coping strategies and mindfulness exercises. We reviewed the risks that SSRI could cause conversion to mania episodes given her bipolar spectrum of illness, but that this risk is less than 5%. She was agreeable to this, and to Biochemist, clinical know via my chart how she is doing over the coming weeks. We will follow-up in 2 months as scheduled.  Visit Diagnosis:    ICD-10-CM   1. Bipolar II disorder ([place]) F31.81   2. Chronic post-traumatic stress disorder (PTSD) F43.12     Past Psychiatric History: See intake H&P for full details. Reviewed, with no updates at this time.  Past Medical History:  Past Medical History:  Diagnosis Date  . Asthma   . Bipolar disorder with depression ([place])    and PTSD  . Chicken pox   . Deviated septum   . DJD (degenerative joint disease)    Severe of the right knee-Dr Creighton-ortho and Dr Gean Birchwood, PA-preferred pain managment  . GERD (gastroesophageal reflux disease)   . Seasonal allergic rhinitis     Past Surgical History:  Procedure Laterality Date  . knee surgery x5     08,11, 12, 13 x2- R knee  . right foot  surgery     arthritis/bone spurus. plate placed.   . WISDOM TOOTH EXTRACTION      Family Psychiatric History: See intake H&P for full details. Reviewed, with no updates at this time.   Family History:  Family History  Problem Relation Age of Onset  . Mental illness Mother        does not speak to regularly so does not know full history  . Other Father        medical issues, divorce related  . Other Brother        ptsd- fully discharged medically disabled before age 65  . Cancer Paternal Grandfather     Social History:  Social History   Social History  . Marital status: Married    Spouse name: Debra Nelson  . Number of children: 4  . Years of education: College   Occupational History  .  Other   Social History Main Topics  . Smoking status: Never Smoker  . Smokeless tobacco: Never Used  . Alcohol use No  . Drug use: No  . Sexual activity: Yes    Partners: Male    Birth control/ protection: Pill   Other Topics Concern  . 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 2 older children (one  age 63 from rape, then 1 year old- does not get to see- related to bipolar) Moved in with in laws to help them      Patient has 4 children (3 of her own and 1 step son).    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   Patient is right-handed.    Allergies:  Allergies  Allergen Reactions  . Klonopin [Clonazepam] Anaphylaxis and Nausea Only    Insomnia/HA/Fatigued  . Other Other (See Comments)  . Phenergan [Promethazine Hcl]     IV causes muscle tics, may take oral, or give benadryl prior to IV  . Promethazine Other (See Comments)    She cannot take IV phenergan unless this it is given with Benadryl.  . Promethazine Hcl Other (See Comments)    IV causes muscle tics, may  take oral, or give benadryl prior to IV  . Topamax [Topiramate] Nausea And Vomiting    confusion    Metabolic Disorder Labs: No results found for: HGBA1C, MPG No results found for: PROLACTIN No results found for: CHOL, TRIG, HDL, CHOLHDL, VLDL, LDLCALC Lab Results  Component Value Date   TSH 5.460 (H) 04/12/2017    Therapeutic Level Labs: Lab Results  Component Value Date   LITHIUM 0.6 04/12/2017   LITHIUM 0.7 08/31/2016   No results found for: VALPROATE No components found for:  CBMZ  Current Medications: Current Outpatient Prescriptions  Medication Sig Dispense Refill  . albuterol (2.5 MG/3ML) 0.083% NEBU 3 mL, albuterol (5 MG/ML) 0.5% NEBU 0.5 mL Inhale into the lungs daily as needed.    . eletriptan (RELPAX) 40 MG tablet Take 1 tablet (40 mg total) by mouth as needed for migraine or headache. One tablet by mouth at onset of headache. May repeat in 2 hours if headache persists or recurs. 18 tablet 3  . EPIPEN 2-PAK 0.3 MG/0.3ML SOAJ injection as needed.    . hydrOXYzine (VISTARIL) 50 MG capsule Take 1 capsule (50 mg total) by mouth 3 (three) times daily as needed. 90 capsule 1  . L-Methylfolate 15 MG TABS Take 1 tablet (15 mg total) by mouth daily. 90 tablet 1  . lamoTRIgine (LAMICTAL) 100 MG tablet Take 1 tablet (100 mg total) by mouth 2 (two) times daily. 120 tablet 1  . lithium carbonate (LITHOBID) 300 MG CR tablet Take 1 tablet (300 mg total) by mouth daily. 90 tablet 1  . montelukast (SINGULAIR) 10 MG tablet Take 10 mg by mouth at bedtime.    . Multiple Vitamin (MULTIVITAMIN) tablet Take 1 tablet by mouth daily.    . norethindrone-ethinyl estradiol (JUNEL FE,GILDESS FE,LOESTRIN FE) 1-20 MG-MCG tablet Take 1 tablet by mouth daily.    . promethazine (PHENERGAN) 25 MG tablet Take 1 tablet (25 mg total) by mouth every 6 (six) hours as needed for nausea or vomiting. 30 tablet 3  . XYREM 500 MG/ML SOLN Take 6 mLs (3,000 mg total) by mouth daily after supper. 3.0  grams po for  first dose and 3.0 grams for second dose nightly as directed 270 mL 5  . albuterol (PROAIR HFA) 108 (90 Base) MCG/ACT inhaler Inhale 2 puffs into the lungs every 6 (six) hours as needed for wheezing or shortness of breath.    Lenda Kelp 1/20 1-20 MG-MCG tablet Take 1 tablet by mouth daily.    Marland Kitchen OVER THE COUNTER MEDICATION Biotin,  vitamin E, Calcium D, cranberry, vitamin c    . sertraline (ZOLOFT) 25 MG tablet Take 1 tablet (25 mg total) by mouth daily. Increase to 50 mg in 2 weeks 90 tablet 1   No current facility-administered medications for this visit.      Musculoskeletal: Strength & Muscle Tone: within normal limits Gait & Station: normal Patient leans: N/A  Psychiatric Specialty Exam: ROS  Blood pressure 102/68, pulse 92, height 5' 1.5" (1.562 m), weight 134 lb (60.8 kg).Body mass index is 24.91 kg/m.  General Appearance: Casual and Well Groomed  Eye Contact:  Good  Speech:  Clear and Coherent  Volume:  Normal  Mood:  Anxious  Affect:  Congruent  Thought Process:  Coherent and Goal Directed  Orientation:  Full (Time, Place, and Person)  Thought Content: Logical   Suicidal Thoughts:  No  Homicidal Thoughts:  No  Memory:  Recent;   Fair  Judgement:  Good  Insight:  Fair  Psychomotor Activity:  Normal  Concentration:  Concentration: Good  Recall:  Good  Fund of Knowledge: Good  Language: Good  Akathisia:  Negative  Handed:  Right  AIMS (if indicated): not done  Assets:  Communication Skills Desire for Improvement Financial Resources/Insurance Housing Intimacy Leisure Time Physical Health Resilience Social Support Talents/Skills Transportation Vocational/Educational  ADL's:  Intact  Cognition: WNL  Sleep:  Good   Screenings: PHQ2-9     Office Visit from 04/07/2015 in [facility]  PHQ-2 Total Score  4  PHQ-9 Total Score  14       Assessment and Plan: POSEY JASMIN is a 36 year old female with chronic PTSD, bipolar 2, generalized  anxiety, narcolepsy. She is on an adequate mood stabilizing regimen with Lamictal and lithium. She continues to struggle with anxiety, excessive worry, panic, and rumination. She did not have any significant sustained improvement with Rexulti, she had an initial improvements which plateaued. Rather than increasing Rexulti, we agreed to switch to Zoloft for a more gentle approach. She has not been tried on any SSRI in the past except for Lexapro, which was not effective for her. We agreed to proceed as below and follow up in 2 months.  1. Bipolar II disorder ([place])   2. Chronic post-traumatic stress disorder (PTSD)     Taper Rexulti to 1 mg for 1 week then discontinue Continue lithium 300 mg daily and Lamictal 100 mg twice daily for mood stabilizer Initiate Zoloft 25 mg 1 week, then 50 mg Vistaril 50-100 mg as needed for anxiety or panic Return to clinic in 3 months   Aundra Dubin, MD 07/26/2017, 1:57 PM

## 2017-07-27 ENCOUNTER — Encounter (HOSPITAL_COMMUNITY): Payer: Self-pay | Admitting: Psychiatry

## 2017-07-30 NOTE — Telephone Encounter (Addendum)
Kristen/ESSDS [phone] called reg PA. I advised her of the below. FYI

## 2017-08-10 ENCOUNTER — Encounter (HOSPITAL_COMMUNITY): Payer: Self-pay | Admitting: Psychiatry

## 2017-08-10 ENCOUNTER — Encounter: Payer: Self-pay | Admitting: Neurology

## 2017-08-13 ENCOUNTER — Encounter (HOSPITAL_COMMUNITY): Payer: Self-pay | Admitting: Psychiatry

## 2017-08-13 ENCOUNTER — Other Ambulatory Visit (HOSPITAL_COMMUNITY): Payer: Self-pay | Admitting: Psychiatry

## 2017-08-13 DIAGNOSIS — F3130 Bipolar disorder, current episode depressed, mild or moderate severity, unspecified: Secondary | ICD-10-CM

## 2017-08-13 MED ORDER — QUETIAPINE FUMARATE 100 MG PO TABS: 100.0000 mg | ORAL_TABLET | Freq: Every day | ORAL | 2 refills | Status: DC

## 2017-08-15 ENCOUNTER — Other Ambulatory Visit (HOSPITAL_COMMUNITY): Payer: Self-pay | Admitting: Psychiatry

## 2017-08-15 DIAGNOSIS — F3130 Bipolar disorder, current episode depressed, mild or moderate severity, unspecified: Secondary | ICD-10-CM

## 2017-08-15 MED ORDER — QUETIAPINE FUMARATE 100 MG PO TABS: 100.0000 mg | ORAL_TABLET | Freq: Every day | ORAL | 2 refills | Status: DC

## 2017-09-04 ENCOUNTER — Other Ambulatory Visit: Payer: Self-pay | Admitting: Neurology

## 2017-09-04 DIAGNOSIS — Z79899 Other long term (current) drug therapy: Secondary | ICD-10-CM

## 2017-09-04 DIAGNOSIS — G47411 Narcolepsy with cataplexy: Secondary | ICD-10-CM

## 2017-09-04 DIAGNOSIS — F3110 Bipolar disorder, current episode manic without psychotic features, unspecified: Secondary | ICD-10-CM

## 2017-09-04 MED ORDER — XYREM 500 MG/ML PO SOLN: 3000.0000 mg | ORAL | 5 refills | Status: DC

## 2017-09-24 ENCOUNTER — Ambulatory Visit (HOSPITAL_COMMUNITY): Payer: Self-pay | Admitting: Psychiatry

## 2017-10-17 ENCOUNTER — Encounter (HOSPITAL_COMMUNITY): Payer: Self-pay | Admitting: Psychiatry

## 2017-10-17 ENCOUNTER — Other Ambulatory Visit (HOSPITAL_COMMUNITY): Payer: Self-pay | Admitting: Psychiatry

## 2017-10-17 DIAGNOSIS — F3130 Bipolar disorder, current episode depressed, mild or moderate severity, unspecified: Secondary | ICD-10-CM

## 2017-10-17 MED ORDER — QUETIAPINE FUMARATE ER 150 MG PO TB24: 150.0000 mg | ORAL_TABLET | ORAL | 0 refills | Status: DC

## 2017-11-07 ENCOUNTER — Ambulatory Visit (HOSPITAL_COMMUNITY): Payer: BLUE CROSS/BLUE SHIELD | Admitting: Psychiatry

## 2017-11-07 ENCOUNTER — Encounter (HOSPITAL_COMMUNITY): Payer: Self-pay | Admitting: Psychiatry

## 2017-11-07 DIAGNOSIS — Z1589 Genetic susceptibility to other disease: Secondary | ICD-10-CM

## 2017-11-07 DIAGNOSIS — F3181 Bipolar II disorder: Secondary | ICD-10-CM | POA: Diagnosis not present

## 2017-11-07 DIAGNOSIS — F410 Panic disorder [episodic paroxysmal anxiety] without agoraphobia: Secondary | ICD-10-CM | POA: Diagnosis not present

## 2017-11-07 DIAGNOSIS — E7212 Methylenetetrahydrofolate reductase deficiency: Secondary | ICD-10-CM

## 2017-11-07 DIAGNOSIS — F319 Bipolar disorder, unspecified: Secondary | ICD-10-CM | POA: Diagnosis not present

## 2017-11-07 MED ORDER — LAMOTRIGINE 100 MG PO TABS: 100.0000 mg | ORAL_TABLET | Freq: Two times a day (BID) | ORAL | 1 refills | Status: DC

## 2017-11-07 MED ORDER — HYDROXYZINE PAMOATE 50 MG PO CAPS: 50.0000 mg | ORAL_CAPSULE | Freq: Three times a day (TID) | ORAL | 1 refills | Status: DC | PRN

## 2017-11-07 MED ORDER — LURASIDONE HCL 80 MG PO TABS: 80.0000 mg | ORAL_TABLET | Freq: Every day | ORAL | 1 refills | Status: DC

## 2017-11-07 MED ORDER — L-METHYLFOLATE 15 MG PO TABS: 15.0000 mg | ORAL_TABLET | Freq: Every day | ORAL | 1 refills | Status: DC

## 2017-11-07 MED ORDER — LITHIUM CARBONATE ER 300 MG PO TBCR: 300.0000 mg | EXTENDED_RELEASE_TABLET | Freq: Every day | ORAL | 1 refills | Status: DC

## 2017-11-07 NOTE — Progress Notes (Signed)
[place] MD/PA/NP OP Progress Note  11/07/2017 11:02 AM Debra Nelson  MRN:  [id number]  Chief Complaint:  anxiety, angry, irritable  HPI: Debra Nelson reports that Seroquel was initially helpful, but has made her feel numb and contributed to weight gain.  She continues to struggle with anxiety, irritability, poor frustration tolerance, and wonders about switching back to [place].  We reviewed that her previous dose had been 60 mg, and she continued to have breakthrough anxiety.  We discussed the risks and benefits of increasing to 80 mg and potentially 120 mg for a maintenance dose.  She was agreeable to the plan of care.  I encouraged her to use the Vistaril as needed, even if she is needing it 1-2 times a day for breakthrough anxiety.  She reports that she is sleeping well, denies any acute safety issues.  We agreed to follow-up in 12 weeks or sooner if needed.  Visit Diagnosis:    ICD-10-CM   1. Bipolar I disorder ([facility]) F31.9 hydrOXYzine (VISTARIL) 50 MG capsule  2. Panic F41.0 hydrOXYzine (VISTARIL) 50 MG capsule  3. MTHFR gene mutation ([facility]) E72.12 L-Methylfolate 15 MG TABS  4. Bipolar II disorder ([facility]) F31.81 lamoTRIgine (LAMICTAL) 100 MG tablet    lithium carbonate (LITHOBID) 300 MG CR tablet    lurasidone (LATUDA) 80 MG TABS tablet    Past Psychiatric History: See intake H&P for full details. Reviewed, with no updates at this time.   Past Medical History:  Past Medical History:  Diagnosis Date  . Asthma   . Bipolar disorder with depression ([place])    and PTSD  . Chicken pox   . Deviated septum   . DJD (degenerative joint disease)    Severe of the right knee-Dr Creighton-ortho and Dr Gean Birchwood, PA-preferred pain managment  . GERD (gastroesophageal reflux disease)   . Seasonal allergic rhinitis     Past Surgical History:  Procedure Laterality Date  . knee surgery x5     08,11, 12, 13 x2- R knee  . right foot surgery     arthritis/bone spurus. plate placed.   . WISDOM  TOOTH EXTRACTION      Family Psychiatric History: See intake H&P for full details. Reviewed, with no updates at this time.   Family History:  Family History  Problem Relation Age of Onset  . Mental illness Mother        does not speak to regularly so does not know full history  . Other Father        medical issues, divorce related  . Other Brother        ptsd- fully discharged medically disabled before age 33  . Cancer Paternal Grandfather     Social History:  Social History   Socioeconomic History  . Marital status: Married    Spouse name: [name]  . Number of children: 4  . Years of education: College  . Highest education level: None  Social Needs  . Financial resource strain: None  . Food insecurity - worry: None  . Food insecurity - inability: None  . Transportation needs - medical: None  . Transportation needs - non-medical: None  Occupational History    Employer: OTHER  Tobacco Use  . Smoking status: Never Smoker  . Smokeless tobacco: Never Used  Substance and Sexual Activity  . Alcohol use: No  . Drug use: No  . Sexual activity: Yes    Partners: Male    Birth control/protection: Pill  Other Topics Concern  .  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 2 older children (one age 78 from rape, then 56 year old- does not get to see- related to bipolar) Moved in with in laws to help them      Patient has 4 children (3 of her own and 1 step son).    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   Patient is right-handed.    Allergies:  Allergies  Allergen Reactions  . Klonopin [Clonazepam] Anaphylaxis and Nausea Only    Insomnia/HA/Fatigued  . Other Other (See Comments)  . Phenergan [Promethazine Hcl]     IV causes muscle tics, may take oral, or give  benadryl prior to IV  . Promethazine Other (See Comments)    She cannot take IV phenergan unless this it is given with Benadryl.  . Promethazine Hcl Other (See Comments)    IV causes muscle tics, may take oral, or give benadryl prior to IV  . Topamax [Topiramate] Nausea And Vomiting    confusion    Metabolic Disorder Labs: No results found for: HGBA1C, MPG No results found for: PROLACTIN No results found for: CHOL, TRIG, HDL, CHOLHDL, VLDL, LDLCALC Lab Results  Component Value Date   TSH 5.460 (H) 04/12/2017    Therapeutic Level Labs: Lab Results  Component Value Date   LITHIUM 0.6 04/12/2017   LITHIUM 0.7 08/31/2016   No results found for: VALPROATE No components found for:  CBMZ  Current Medications: Current Outpatient Medications  Medication Sig Dispense Refill  . albuterol (2.5 MG/3ML) 0.083% NEBU 3 mL, albuterol (5 MG/ML) 0.5% NEBU 0.5 mL Inhale into the lungs daily as needed.    Marland Kitchen albuterol (PROAIR HFA) 108 (90 Base) MCG/ACT inhaler Inhale 2 puffs into the lungs every 6 (six) hours as needed for wheezing or shortness of breath.    . eletriptan (RELPAX) 40 MG tablet Take 1 tablet (40 mg total) by mouth as needed for migraine or headache. One tablet by mouth at onset of headache. May repeat in 2 hours if headache persists or recurs. 18 tablet 3  . EPIPEN 2-PAK 0.3 MG/0.3ML SOAJ injection as needed.    . hydrOXYzine (VISTARIL) 50 MG capsule Take 1 capsule (50 mg total) by mouth 3 (three) times daily as needed. 90 capsule 1  . JUNEL 1/20 1-20 MG-MCG tablet Take 1 tablet by mouth daily.    Marland Kitchen L-Methylfolate 15 MG TABS Take 1 tablet (15 mg total) by mouth daily. 90 tablet 1  . lamoTRIgine (LAMICTAL) 100 MG tablet Take 1 tablet (100 mg total) by mouth 2 (two) times daily. 180 tablet 1  . lithium carbonate (LITHOBID) 300 MG CR tablet Take 1 tablet (300 mg total) by mouth daily. 90 tablet 1  . montelukast (SINGULAIR) 10 MG tablet Take 10 mg by mouth at bedtime.    . Multiple  Vitamin (MULTIVITAMIN) tablet Take 1 tablet by mouth daily.    . norethindrone-ethinyl estradiol (JUNEL FE,GILDESS FE,LOESTRIN FE) 1-20 MG-MCG tablet Take 1 tablet by mouth daily.    Marland Kitchen OVER THE COUNTER MEDICATION Biotin, vitamin E, Calcium D, cranberry, vitamin c    . promethazine (PHENERGAN) 25 MG tablet Take 1 tablet (25 mg total) by mouth every 6 (six)  hours as needed for nausea or vomiting. 30 tablet 3  . XYREM 500 MG/ML SOLN Take 6 mLs (3,000 mg total) by mouth daily after supper. 3.0  grams po for first dose and 3.0 grams for second dose nightly as directed 270 mL 5  . lurasidone (LATUDA) 80 MG TABS tablet Take 1 tablet (80 mg total) by mouth daily with breakfast. 90 tablet 1   No current facility-administered medications for this visit.      Musculoskeletal: Strength & Muscle Tone: within normal limits Gait & Station: normal Patient leans: N/A  Psychiatric Specialty Exam: ROS  Blood pressure 102/64, pulse 88, height 5\' 2"  (1.575 m), weight 144 lb (65.3 kg), SpO2 99 %.Body mass index is 26.34 kg/m.  General Appearance: Casual and Fairly Groomed  Eye Contact:  Fair  Speech:  Clear and Coherent  Volume:  Normal  Mood:  Irritable  Affect:  Congruent  Thought Process:  Goal Directed and Descriptions of Associations: Intact  Orientation:  Full (Time, Place, and Person)  Thought Content: Logical   Suicidal Thoughts:  No  Homicidal Thoughts:  No  Memory:  Immediate;   Fair  Judgement:  Fair  Insight:  Fair  Psychomotor Activity:  Normal  Concentration:  Concentration: Fair  Recall:  [employer] of Knowledge: Fair  Language: Fair  Akathisia:  Negative  Handed:  Right  AIMS (if indicated): not done  Assets:  Communication Skills Desire for Improvement Financial Resources/Insurance Housing Intimacy Social Support Transportation  ADL's:  Intact  Cognition: WNL  Sleep:  Good   Screenings: PHQ2-9     Office Visit from 04/07/2015 in [facility]  PHQ-2  Total Score  4  PHQ-9 Total Score  14       Assessment and Plan:  Debra Nelson continues to struggle with breakthrough anxiety, depression, irritability, in the context of bipolar disorder and multiple external stressors.  She does not present with mania or psychosis.  No acute safety issues or suicidality.  She is sleeping consistently with Xyrem.  We attempted Seroquel, which had some partial benefits, but patient has gained approximately 8-10 pounds, and feels flat and tired.  We agreed to return to [facility] to gradually titrate to a higher dose.  Proceed as below and follow-up in 3 months.  1. Bipolar I disorder ([place])   2. Panic   3. MTHFR gene mutation ([place])   4. Bipolar II disorder ([place])     Status of current problems: unchanged  Labs Ordered: No orders of the defined types were placed in this encounter.   Labs Reviewed: NA  Collateral Obtained/Records Reviewed: N/A  Plan:  Continue Lamictal 100 mg twice daily, lithium 300 mg nightly, Vistaril as needed for anxiety, daily l-methylfolate Discontinue Seroquel Initiate Latuda 40 mg daily, increase to 80 mg daily in 2 weeks In 1 month, we may consider increasing to 120 mg daily if necessary RTC 12 weeks  I spent 20 minutes with the patient in direct face-to-face clinical care.  Greater than 50% of this time was spent in counseling and coordination of care with the patient.    Aundra Dubin, MD 11/07/2017, 11:02 AM

## 2017-11-07 NOTE — Patient Instructions (Signed)
STOP seroquel  START latuda 40 mg daily, increase to 80 mg in 2 weeks

## 2017-11-09 ENCOUNTER — Encounter (HOSPITAL_COMMUNITY): Payer: Self-pay | Admitting: Psychiatry

## 2017-11-15 ENCOUNTER — Telehealth: Payer: Self-pay | Admitting: Neurology

## 2017-11-15 NOTE — Telephone Encounter (Signed)
Called to make them aware that Dr Brett Fairy is ok with these meds being taken. They will document for their records

## 2017-11-15 NOTE — Telephone Encounter (Signed)
latuda and xyrem interactions, I had to google. I don't see an obvious interaction - please ask Jennise to call pharmacy- [facility].

## 2017-11-15 NOTE — Telephone Encounter (Signed)
Xyrem specialty pharmacy is calling to inform us that that pt has stopped taking seroquel. She is currently taken Latuda in the am and still continuing her Xyrem. The pharmacy just wanted to make [place] aware of this change and call them back that Dr Brett Fairy is ok with this change. Their number is [phone] opt 3 then 4 to speak with pharmacist.

## 2017-11-16 ENCOUNTER — Telehealth (HOSPITAL_COMMUNITY): Payer: Self-pay | Admitting: *Deleted

## 2017-11-16 NOTE — Telephone Encounter (Signed)
Prior authorization for Latuda received. Submitted online with cover my meds. Awaiting response.

## 2017-11-23 ENCOUNTER — Ambulatory Visit (INDEPENDENT_AMBULATORY_CARE_PROVIDER_SITE_OTHER): Payer: BLUE CROSS/BLUE SHIELD | Admitting: Orthopaedic Surgery

## 2017-11-23 ENCOUNTER — Ambulatory Visit (INDEPENDENT_AMBULATORY_CARE_PROVIDER_SITE_OTHER): Payer: Self-pay

## 2017-11-23 ENCOUNTER — Ambulatory Visit (INDEPENDENT_AMBULATORY_CARE_PROVIDER_SITE_OTHER): Payer: BLUE CROSS/BLUE SHIELD

## 2017-11-23 DIAGNOSIS — M79645 Pain in left finger(s): Secondary | ICD-10-CM | POA: Insufficient documentation

## 2017-11-23 DIAGNOSIS — M79646 Pain in unspecified finger(s): Secondary | ICD-10-CM | POA: Diagnosis not present

## 2017-11-23 DIAGNOSIS — G8929 Other chronic pain: Secondary | ICD-10-CM

## 2017-11-23 DIAGNOSIS — M79644 Pain in right finger(s): Secondary | ICD-10-CM | POA: Insufficient documentation

## 2017-11-23 MED ORDER — DICLOFENAC SODIUM 1 % TD GEL: 2.0000 g | Freq: Four times a day (QID) | TRANSDERMAL | Status: DC | PRN

## 2017-11-23 MED ORDER — MELOXICAM 7.5 MG PO TABS: 7.5000 mg | ORAL_TABLET | Freq: Every day | ORAL | 2 refills | Status: DC

## 2017-11-23 NOTE — Progress Notes (Signed)
Office Visit Note   Patient: Debra Nelson           Date of Birth: 1981/05/10           MRN: [id number] Visit Date: 11/23/2017              Requested by: Marin Olp, MD [place], [place] [address] PCP: Marin Olp, MD   Assessment & Plan: Visit Diagnoses:  1. Chronic thumb pain, bilateral     Plan: At this point, we have ruled out [facility] osteoarthritis, trigger finger, de Quervain's tenosynovitis.  We are going to place her on a regularly scheduled course of oral and topical anti-inflammatories.  She will follow-up with [place] on an as-needed basis.  Follow-Up Instructions: Return if symptoms worsen or fail to improve.   Orders:  Orders Placed This Encounter  Procedures  . XR Finger Thumb Right  . XR Finger Thumb Left   Meds ordered this encounter  Medications  . meloxicam (MOBIC) 7.5 MG tablet    Sig: Take 1 tablet (7.5 mg total) by mouth daily.    Dispense:  30 tablet    Refill:  2    Order Specific Question:   Supervising Provider    Answer:   Leandrew Koyanagi [phone]  . diclofenac sodium (VOLTAREN) 1 % transdermal gel 2 g      Procedures: No procedures performed   Clinical Data: No additional findings.   Subjective: Bilateral thumb pain  HPI this is a 37 year old right-hand-dominant female who presents to our clinic today with bilateral thumb pain, right greater than left.  This is been ongoing for the past month.  No known injury or change in activity.  She is a stay-at-home mom.  Pain is worse with any movement but specifically with flexion.  No locking or triggering.  No pain at the A1 pulley she has noted some swelling to the right thumb.  No pain over the first dorsal compartment or the [facility] joint.  No numbness tingling burning.  Of note she does have a history of a left trigger thumb which was injected with cortisone back in April 2018.  The cortisone injection that she was given did not help.  She does also note that this pain is  completely different.  Review of Systems as detailed in HPI.  All others reviewed and are negative.   Objective: Vital Signs: There were no vitals taken for this visit.  Physical Exam well-developed well-nourished female in no acute distress.  Alert and oriented x3.  Ortho Exam examination of both thumbs reveals tenderness at the MCP joint as well as diffuse tenderness over the distal and proximal phalanx.  No tenderness over the A1 pulley.  Negative grind.  No tenderness over the first dorsal compartment.  Specialty Comments:  No specialty comments available.  Imaging: Xr Finger Thumb Left  Result Date: 11/23/2017 No evidence of fracture or other acute findings.  Xr Finger Thumb Right  Result Date: 11/23/2017 No evidence of fracture or other acute findings.    PMFS History: Patient Active Problem List   Diagnosis Date Noted  . Elevated TSH 05/07/2017  . Trigger thumb, left thumb 03/15/2017  . Chronic post-traumatic stress disorder (PTSD) 02/26/2017  . Sensorineural hearing loss (SNHL) of both ears 12/12/2016  . Asthma 10/27/2016  . GERD (gastroesophageal reflux disease) 10/27/2016  . Seasonal allergic rhinitis 05/02/2016  . Right ear pain 05/02/2016  . Dysfunction of both eustachian tubes 05/02/2016  .  Narcolepsy cataplexy syndrome 10/19/2015  . Intractable migraine with aura with status migrainosus 10/19/2015  . Narcolepsy and cataplexy 03/06/2014  . Bipolar II disorder ([place]) 12/31/2013  . Chondromalacia of knee 03/07/2013   Past Medical History:  Diagnosis Date  . Asthma   . Bipolar disorder with depression ([place])    and PTSD  . Chicken pox   . Deviated septum   . DJD (degenerative joint disease)    Severe of the right knee-Dr Creighton-ortho and Dr Gean Birchwood, PA-preferred pain managment  . GERD (gastroesophageal reflux disease)   . Seasonal allergic rhinitis     Family History  Problem Relation Age of Onset  . Mental illness Mother        does not speak  to regularly so does not know full history  . Other Father        medical issues, divorce related  . Other Brother        ptsd- fully discharged medically disabled before age 44  . Cancer Paternal Grandfather     Past Surgical History:  Procedure Laterality Date  . knee surgery x5     08,11, 12, 13 x2- R knee  . right foot surgery     arthritis/bone spurus. plate placed.   . WISDOM TOOTH EXTRACTION     Social History   Occupational History    Employer: OTHER  Tobacco Use  . Smoking status: Never Smoker  . Smokeless tobacco: Never Used  Substance and Sexual Activity  . Alcohol use: No  . Drug use: No  . Sexual activity: Yes    Partners: Male    Birth control/protection: Pill

## 2017-11-27 ENCOUNTER — Other Ambulatory Visit (INDEPENDENT_AMBULATORY_CARE_PROVIDER_SITE_OTHER): Payer: Self-pay

## 2017-11-27 ENCOUNTER — Telehealth (INDEPENDENT_AMBULATORY_CARE_PROVIDER_SITE_OTHER): Payer: Self-pay | Admitting: Orthopaedic Surgery

## 2017-11-27 MED ORDER — DICLOFENAC SODIUM 1 % TD GEL: 2.0000 g | Freq: Four times a day (QID) | TRANSDERMAL | 0 refills | Status: DC

## 2017-11-27 NOTE — Telephone Encounter (Signed)
[facility] also called to check on status of this prescription for patient.

## 2017-11-27 NOTE — Telephone Encounter (Signed)
Patient called advised the pharmacy do not have the Rx for Voltaren gel, The number to contact patient is [phone]

## 2017-11-27 NOTE — Telephone Encounter (Signed)
Sent in to pharm.

## 2017-11-29 ENCOUNTER — Telehealth (HOSPITAL_COMMUNITY): Payer: Self-pay | Admitting: *Deleted

## 2017-11-29 NOTE — Telephone Encounter (Signed)
Prior authorization for Latuda received. Attempted to complete with cover my meds. It was completed and approved 11/16/17-11/19/38. Called to notify pharmacy.

## 2017-12-12 ENCOUNTER — Ambulatory Visit: Payer: Self-pay | Admitting: Sports Medicine

## 2017-12-14 ENCOUNTER — Ambulatory Visit (INDEPENDENT_AMBULATORY_CARE_PROVIDER_SITE_OTHER): Payer: BLUE CROSS/BLUE SHIELD | Admitting: Family Medicine

## 2017-12-14 ENCOUNTER — Encounter: Payer: Self-pay | Admitting: Family Medicine

## 2017-12-14 VITALS — BP 118/74 | HR 92 | Temp 98.4°F | Ht 61.0 in | Wt 142.6 lb

## 2017-12-14 DIAGNOSIS — R05 Cough: Secondary | ICD-10-CM | POA: Diagnosis not present

## 2017-12-14 DIAGNOSIS — R059 Cough, unspecified: Secondary | ICD-10-CM

## 2017-12-14 MED ORDER — AZITHROMYCIN 250 MG PO TABS: ORAL_TABLET | ORAL | 0 refills | Status: DC

## 2017-12-14 MED ORDER — METHYLPREDNISOLONE ACETATE 80 MG/ML IJ SUSP
80.0000 mg | Freq: Once | INTRAMUSCULAR | Status: AC
  Administered 2017-12-14: 80 mg via INTRAMUSCULAR

## 2017-12-14 MED ORDER — BENZONATATE 200 MG PO CAPS: 200.0000 mg | ORAL_CAPSULE | Freq: Two times a day (BID) | ORAL | 0 refills | Status: DC | PRN

## 2017-12-14 MED ORDER — IPRATROPIUM BROMIDE 0.06 % NA SOLN: 2.0000 | Freq: Four times a day (QID) | NASAL | 0 refills | Status: DC

## 2017-12-14 NOTE — Addendum Note (Signed)
Addended by: Elmer Bales on: 12/14/2017 09:41 AM   Modules accepted: Orders

## 2017-12-14 NOTE — Patient Instructions (Signed)
Start the atrovent.  Start tessalon for your cough.  Start the zpack if your symptoms worsen or do not improve in a few days.  Please stay well hydrated.  You can take tylenol and/or motrin as needed for low grade fever and pain.  Please let me know if your symptoms worsen or fail to improve.  Take care, Dr Julyan Gales  

## 2017-12-14 NOTE — Progress Notes (Signed)
    Subjective:  Debra Nelson is a 37 y.o. female who presents today for same-day appointment with a chief complaint of cough.   HPI:  Cough, Acute Issue Started 3 days ago. Worsened over that time. Associated with sore throat, rhinorrhea, ear pressure, and some shortness of breath with wheeze. No fevers or chills. No known sick contacts. No obvious alleviating or aggravating factors.  ROS: Per HPI  PMH: She reports that  has never smoked. she has never used smokeless tobacco. She reports that she does not drink alcohol or use drugs.  Objective:  Physical Exam: BP 118/74 (BP Location: Left Arm, Patient Position: Sitting, Cuff Size: Normal)   Pulse 92   Temp 98.4 F (36.9 C) (Oral)   Ht 5\' 1"  (1.549 m)   Wt 142 lb 9.6 oz (64.7 kg)   SpO2 96%   BMI 26.94 kg/m   Gen: NAD, resting comfortably HEENT: TMs are clear effusion bilaterally.  Maxillary sinuses clear to transillumination bilaterally.  Oropharynx erythematous without exudate.  No lymphadenopathy. CV: RRR with no murmurs appreciated Pulm: NWOB, mildly diminished breath sounds at bases.  No wheeze.  Speaking in full sentences.   Assessment/Plan:  Cough/asthma Likely secondary to viral URI.  She is having a mild asthma exacerbation.  Her respiratory exam is stable and she is satting at 96% on room air.  No signs of bacterial infection. Start atrovent for rhinorrhea/sinus congestion. Will give 80mg  IM depo-medrol for sore throat and asthma. Start tessalon for cough. Sent in a "pocket prescription" for azithromycin with strict instruction to not start unless symptoms worse or fail to improve within the next several days. Recommended tylenol and/or motrin as needed for low grade fever and pain. Encouraged good oral hydration. Return precautions reviewed. Follow up as needed.   Algis Greenhouse. Jerline Pain, MD 12/14/2017 9:16 AM

## 2017-12-18 ENCOUNTER — Encounter: Payer: Self-pay | Admitting: Sports Medicine

## 2017-12-18 ENCOUNTER — Ambulatory Visit (INDEPENDENT_AMBULATORY_CARE_PROVIDER_SITE_OTHER): Payer: BLUE CROSS/BLUE SHIELD | Admitting: Sports Medicine

## 2017-12-18 ENCOUNTER — Ambulatory Visit: Payer: Self-pay

## 2017-12-18 VITALS — BP 110/88 | HR 78 | Ht 61.0 in | Wt 146.2 lb

## 2017-12-18 DIAGNOSIS — M79646 Pain in unspecified finger(s): Secondary | ICD-10-CM

## 2017-12-18 DIAGNOSIS — M79644 Pain in right finger(s): Secondary | ICD-10-CM | POA: Diagnosis not present

## 2017-12-18 DIAGNOSIS — R7989 Other specified abnormal findings of blood chemistry: Secondary | ICD-10-CM | POA: Diagnosis not present

## 2017-12-18 DIAGNOSIS — M130 Polyarthritis, unspecified: Secondary | ICD-10-CM | POA: Diagnosis not present

## 2017-12-18 DIAGNOSIS — G8929 Other chronic pain: Secondary | ICD-10-CM

## 2017-12-18 DIAGNOSIS — M79645 Pain in left finger(s): Secondary | ICD-10-CM

## 2017-12-18 LAB — COMPREHENSIVE METABOLIC PANEL
ALT: 14 U/L (ref 0–35)
AST: 13 U/L (ref 0–37)
Albumin: 4.4 g/dL (ref 3.5–5.2)
Alkaline Phosphatase: 71 U/L (ref 39–117)
BUN: 8 mg/dL (ref 6–23)
CO2: 29 mEq/L (ref 19–32)
Calcium: 9.8 mg/dL (ref 8.4–10.5)
Chloride: 103 mEq/L (ref 96–112)
Creatinine, Ser: 0.85 mg/dL (ref 0.40–1.20)
GFR: 79.21 mL/min (ref >60.00)
Glucose, Bld: 102 mg/dL — ABNORMAL HIGH (ref 70–99)
Potassium: 4.1 mEq/L (ref 3.5–5.1)
Sodium: 138 mEq/L (ref 135–145)
Total Bilirubin: 0.3 mg/dL (ref 0.2–1.2)
Total Protein: 7.4 g/dL (ref 6.0–8.3)

## 2017-12-18 LAB — CBC WITH DIFFERENTIAL/PLATELET
Basophils Absolute: 0.1 10*3/uL (ref 0.0–0.1)
Basophils Relative: 0.7 % (ref 0.0–3.0)
Eosinophils Absolute: 0.1 10*3/uL (ref 0.0–0.7)
Eosinophils Relative: 0.8 % (ref 0.0–5.0)
HCT: 42.9 % (ref 36.0–46.0)
Hemoglobin: 14.9 g/dL (ref 12.0–15.0)
Lymphocytes Relative: 23.6 % (ref 12.0–46.0)
Lymphs Abs: 1.9 10*3/uL (ref 0.7–4.0)
MCHC: 34.7 g/dL (ref 30.0–36.0)
MCV: 90.6 fl (ref 78.0–100.0)
Monocytes Absolute: 0.5 10*3/uL (ref 0.1–1.0)
Monocytes Relative: 5.8 % (ref 3.0–12.0)
Neutro Abs: 5.4 10*3/uL (ref 1.4–7.7)
Neutrophils Relative %: 69.1 % (ref 43.0–77.0)
Platelets: 460 10*3/uL — ABNORMAL HIGH (ref 150.0–400.0)
RBC: 4.74 Mil/uL (ref 3.87–5.11)
RDW: 11.9 % (ref 11.5–15.5)
WBC: 7.9 10*3/uL (ref 4.0–10.5)

## 2017-12-18 LAB — URIC ACID: Uric Acid, Serum: 3.4 mg/dL (ref 2.4–7.0)

## 2017-12-18 LAB — SEDIMENTATION RATE: Sed Rate: 10 mm/hr (ref 0–20)

## 2017-12-18 LAB — C-REACTIVE PROTEIN: CRP: 0.8 mg/dL (ref 0.5–20.0)

## 2017-12-18 MED ORDER — DICLOFENAC SODIUM 2 % TD SOLN: 1.0000 "application " | Freq: Two times a day (BID) | TRANSDERMAL | 0 refills | Status: AC

## 2017-12-18 MED ORDER — DICLOFENAC SODIUM 2 % TD SOLN
1.0000 | Freq: Two times a day (BID) | TRANSDERMAL | 2 refills | Status: DC
Start: 2017-12-18 — End: 2018-07-02

## 2017-12-18 NOTE — Procedures (Signed)
LIMITED MSK ULTRASOUND OF BILATERAL HANDS Images were obtained and interpreted by myself, Teresa Coombs, DO  Images have been saved and stored to PACS system. Images obtained on: GE S7 Ultrasound machine  FINDINGS:   Slight spurring of bilateral CMC and IP joints and thumb.  There is a small amount of hypoechoic change surrounding the distal aspect of the extensor tendons of the thumb but no significant de Quervain's tenosynovitis appreciated.  Right greater than left effusion of the right thumb IP joint.  Normal median nerves bilaterally  IMPRESSION:  1. Degenerative versus inflammatory arthritis of bilateral thumbs with small underlying effusions but no significant dactylitis

## 2017-12-18 NOTE — Progress Notes (Signed)
Juanda Bond. Willodean Leven, [place] [facility] - 37 y.o. female MRN [id number]  Date of birth: 07/28/1981  Visit Date: 12/18/2017  PCP: Marin Olp, MD   Referred by: Marin Olp, MD   Scribe for today's visit: Josepha Pigg, CMA     SUBJECTIVE:  Debra Nelson is here for New Patient (Initial Visit) (bilateral thumb pain)  Her bilateral thumb pain symptoms INITIALLY: Began about mid 10/2017 and MOI is unknown. She has hx of trigger thumb, received inj in LT thumb.  Described as moderate (varies from mild to severe from day to day) sharp pain with flexion, nonradiating Worsened with flexion, clapping Improved with rest Additional associated symptoms include: She has noticed swelling in the RT thumb.     At this time symptoms are improving compared to onset, pain comes and goes and varied in intensity.  She has been taking Meloxicam and got minimal relief, it also caused GI upset. She was prescribed Voltaren but was unable to start it d/t PA being required. She has tried wearing thumb spica with some relief. She has tried applying heat with some relief and ice with minimal relief.    ROS Denies night time disturbances. Denies fevers, chills, or night sweats. Denies unexplained weight loss. Denies personal history of cancer. Denies changes in bowel or bladder habits. Denies recent unreported falls. Denies new or worsening dyspnea or wheezing. Denies headaches or dizziness.  Denies numbness, tingling or weakness  In the extremities.  Denies dizziness or presyncopal episodes Denies lower extremity edema     HISTORY & PERTINENT PRIOR DATA:  Prior History reviewed and updated per electronic medical record.  Significant history, findings, studies and interim changes include:  reports that  has never smoked. she has never used smokeless tobacco. No results for input(s): HGBA1C, LABURIC, CREATINE in  the last 8760 hours. No specialty comments available. Problem  Chronic Thumb Pain, Bilateral  Elevated Tsh    OBJECTIVE:  VS:  HT:5\' 1"  (154.9 cm)   WT:146 lb 3.2 oz (66.3 kg)  BMI:27.64    BP:110/88  HR:78bpm  TEMP: ( )  RESP:98 %   PHYSICAL EXAM: Constitutional: WDWN, Non-toxic appearing. Psychiatric: Alert & appropriately interactive.  Not depressed or anxious appearing. Respiratory: No increased work of breathing.  Trachea Midline Eyes: Pupils are equal.  EOM intact without nystagmus.  No scleral icterus  NEUROVASCULAR exam: No clubbing or cyanosis appreciated No significant venous stasis changes Capillary Refill: normal, less than 2 seconds   Findings:  Generalized swelling of bilateral thumbs right worse than left with focal swelling over the IP joint on the right.  She has full flexion extension.  No significant pain over the first dorsal compartment bilaterally.  There is no pain with Wynn Maudlin testing or pain with palpation over the DRUJ.  Grip strength is normal.  Ligamentously she is stable.  Small amount of pain with axial load and circumduction of the CMC bilaterally.    ASSESSMENT & PLAN:   1. Bilateral thumb pain   2. Polyarthropathy involving hand   3. Chronic thumb pain, bilateral   4. Elevated TSH    PLAN:    Chronic thumb pain, bilateral Intermittent bilateral thumb pain globally with underlying degenerative changes noted on ultrasound including degenerative spurring that is mild as well as a joint effusion.  There is a small amount of tenosynovitis but this is minimal.  Given the polyarthralgia complaints and  prior elevated CRP of greater than 8 further diagnostic evaluation needed from a rheumatologic standpoint.  Will recheck blood work today and in addition check for rheumatoid factor and anti-CCP which have not been checked previously.  We will have her begin on topical anti-inflammatories as well as 500 mg of turmeric twice daily.  Elevated  TSH Previously elevated question if this is possibly related to autoimmune issues.  If any persistently elevated inflammatory markers could consider referral to rheumatology even in the setting of normal antibody screens   ++++++++++++++++++++++++++++++++++++++++++++ Orders & Meds: Orders Placed This Encounter  Procedures  . [place] LIMITED JOINT SPACE STRUCTURES UP BILAT(NO LINKED CHARGES)  . CBC with Differential/Platelet  . Comprehensive metabolic panel  . Uric acid  . Sedimentation rate  . C-reactive protein  . Rheumatoid factor  . Cyclic citrul peptide antibody, IgG  . ANA    Meds ordered this encounter  Medications  . Diclofenac Sodium (PENNSAID) 2 % SOLN    Sig: Place 1 application onto the skin 2 (two) times daily.    Dispense:  112 g    Refill:  2    Home Phone      [phone] Mobile          [phone]   . Diclofenac Sodium (PENNSAID) 2 % SOLN    Sig: Place 1 application onto the skin 2 (two) times daily for 1 day.    Dispense:  8 g    Refill:  0    ++++++++++++++++++++++++++++++++++++++++++++ Follow-up: Return in about 6 weeks (around 01/29/2018).   Pertinent documentation may be included in additional procedure notes, imaging studies, problem based documentation and patient instructions. Please see these sections of the encounter for additional information regarding this visit. CMA/ATC served as Education administrator during this visit. History, Physical, and Plan performed by medical provider. Documentation and orders reviewed and attested to.      Gerda Diss, [facility] Physician

## 2017-12-18 NOTE — Assessment & Plan Note (Signed)
Intermittent bilateral thumb pain globally with underlying degenerative changes noted on ultrasound including degenerative spurring that is mild as well as a joint effusion.  There is a small amount of tenosynovitis but this is minimal.  Given the polyarthralgia complaints and prior elevated CRP of greater than 8 further diagnostic evaluation needed from a rheumatologic standpoint.  Will recheck blood work today and in addition check for rheumatoid factor and anti-CCP which have not been checked previously.  We will have her begin on topical anti-inflammatories as well as 500 mg of turmeric twice daily.

## 2017-12-18 NOTE — Patient Instructions (Signed)
Turmeric 500 mg twice daily should be added to your regimen. We will call you with the results of the lab work.

## 2017-12-18 NOTE — Assessment & Plan Note (Signed)
Previously elevated question if this is possibly related to autoimmune issues.  If any persistently elevated inflammatory markers could consider referral to rheumatology even in the setting of normal antibody screens

## 2017-12-19 LAB — RHEUMATOID FACTOR: Rhuematoid fact SerPl-aCnc: <14 IU/mL (ref <14)

## 2017-12-19 LAB — CYCLIC CITRUL PEPTIDE ANTIBODY, IGG: Cyclic Citrullin Peptide Ab: <16 UNITS

## 2017-12-19 LAB — ANA: Anti Nuclear Antibody(ANA): NEGATIVE

## 2017-12-19 NOTE — Progress Notes (Signed)
My chart message sent to patient:   All your blood work results came back looking normal which is definitely reassuring and suggest that this is not coming from some autoimmune conditions such as rheumatoid arthritis.  I am happy to go over these results with you at your follow-up appointment and anticipate you feeling significantly better by then.

## 2017-12-22 ENCOUNTER — Encounter: Payer: Self-pay | Admitting: Sports Medicine

## 2018-01-02 ENCOUNTER — Ambulatory Visit: Payer: BLUE CROSS/BLUE SHIELD | Admitting: Neurology

## 2018-01-02 ENCOUNTER — Encounter: Payer: Self-pay | Admitting: Neurology

## 2018-01-02 VITALS — BP 117/80 | HR 90 | Ht 62.0 in | Wt 143.0 lb

## 2018-01-02 DIAGNOSIS — Z79899 Other long term (current) drug therapy: Secondary | ICD-10-CM

## 2018-01-02 DIAGNOSIS — Z5181 Encounter for therapeutic drug level monitoring: Secondary | ICD-10-CM

## 2018-01-02 DIAGNOSIS — G47411 Narcolepsy with cataplexy: Secondary | ICD-10-CM | POA: Diagnosis not present

## 2018-01-02 DIAGNOSIS — G43829 Menstrual migraine, not intractable, without status migrainosus: Secondary | ICD-10-CM

## 2018-01-02 MED ORDER — XYREM 500 MG/ML PO SOLN
ORAL | 5 refills | Status: DC
Start: 2018-01-02 — End: 2018-03-27

## 2018-01-02 MED ORDER — ARMODAFINIL 150 MG PO TABS: 150.0000 mg | ORAL_TABLET | Freq: Every day | ORAL | 5 refills | Status: DC

## 2018-01-02 MED ORDER — SUMATRIPTAN SUCCINATE 100 MG PO TABS: 50.0000 mg | ORAL_TABLET | ORAL | 2 refills | Status: DC | PRN

## 2018-01-02 NOTE — Patient Instructions (Signed)
Armodafinil tablets What is this medicine? ARMODAFINIL (ar moe DAF i nil) is used to treat excessive sleepiness caused by certain sleep disorders. This includes narcolepsy, sleep apnea, and shift work sleep disorder. This medicine may be used for other purposes; ask your health care provider or pharmacist if you have questions. COMMON BRAND NAME(S): Nuvigil What should I tell my health care provider before I take this medicine? They need to know if you have any of these conditions: -bipolar disorder -depression -drug or alcohol abuse or addiction -heart disease -high blood pressure -kidney disease -liver disease -schizophrenia -suicidal thoughts, plans, or attempt; a previous suicide attempt by you or a family member -an unusual or allergic reaction to armodafinil, modafinil, medicines, foods, dyes, or preservatives -pregnant or trying to get pregnant -breast-feeding How should I use this medicine? Take this medicine by mouth with a glass of water. Follow the directions on the prescription label. Take your doses at regular intervals. Do not take your medicine more often than directed. Do not stop taking this medicine suddenly except upon the advice of your doctor. Stopping this medicine too quickly may cause serious side effects or your condition may worsen. A special MedGuide will be given to you by the pharmacist with each prescription and refill. Be sure to read this information carefully each time. Talk to your pediatrician regarding the use of this medicine in children. While this drug may be prescribed for children as young as 17 years of age for selected conditions, precautions do apply. Overdosage: If you think you have taken too much of this medicine contact a poison control center or emergency room at once. NOTE: This medicine is only for you. Do not share this medicine with others. What if I miss a dose? If you miss a dose, take it as soon as you can. If it is almost time for  your next dose, take only that dose. Do not take double or extra doses. What may interact with this medicine? Do not take this medicine with any of the following medications: -amphetamine or dextroamphetamine -dexmethylphenidate or methylphenidate -MAOIs like Carbex, Eldepryl, Marplan, Nardil, and Parnate -pemoline -procarbazine This medicine may also interact with the following medications: -antifungal medicines like itraconazole or ketoconazole -barbiturates, like phenobarbital -birth control pills or other hormone-containing birth control devices or implants -carbamazepine -cyclosporine -diazepam -medicines for depression, anxiety, or psychotic disturbances -phenytoin -propranolol -triazolam -warfarin This list may not describe all possible interactions. Give your health care provider a list of all the medicines, herbs, non-prescription drugs, or dietary supplements you use. Also tell them if you smoke, drink alcohol, or use illegal drugs. Some items may interact with your medicine. What should I watch for while using this medicine? Visit your doctor or health care professional for regular checks on your progress. The full effect of this medicine may not be seen right away. This medicine may affect your concentration, function, or may hide signs that you are tired. You may get dizzy. This medicine will not eliminate your abnormal tendency to fall asleep and is not a replacement for sleep. Do not change your previous behavior regarding potentially dangerous activities, such as driving, using machinery, or doing anything that needs mental alertness until you know how this drug affects you. Alcohol can make you more dizzy and may interfere with your response to this medicine or your alertness. Avoid alcoholic drinks. Birth control pills may not work properly while you are taking this medicine. While using birth control pills, you will need an   additional barrier method or an alternative  non-hormonal method of birth control during treatment with armodafinil and for 1 month after stopping armodafinil. Talk to your doctor about which extra method of birth control is right for you. It is unknown if the effects of this medicine will be increased by the use of caffeine. Caffeine is found in many foods, beverages, and medications. Ask your doctor if you should limit or change your intake of caffeine-containing products while on this medicine. Do not stop previously prescribed treatments for your condition, such as a CPAP machine, except on the advise of your physician or health care professional. What side effects may I notice from receiving this medicine? Side effects that you should report to your doctor or health care professional as soon as possible: -allergic reactions like skin rash, itching or hives, swelling of the face, lips, or tongue -anxiety -breathing or swallowing problems -chest pain -depressed mood -elevated mood, decreased need for sleep, racing thoughts, impulsive behavior -fast, irregular heartbeat -fever with rash, swollen lymph nodes, or swelling of the face -hallucination, loss of contact with reality -increased blood pressure -mouth sores, blisters, or peeling skin -sore throat, fever, or chills -suicidal thoughts or other mood changes -tremors -vomiting Side effects that usually do not require medical attention (report to your doctor or health care professional if they continue or are bothersome): -headache -nausea, diarrhea, or stomach upset -nervousness -trouble sleeping This list may not describe all possible side effects. Call your doctor for medical advice about side effects. You may report side effects to FDA at 1-800-FDA-1088. Where should I keep my medicine? Keep out of the reach of children. Store at room temperature between 20 and 25 degrees C (68 and 77 degrees F). Throw away any unused medicine after the expiration date. NOTE: This sheet is  a summary. It may not cover all possible information. If you have questions about this medicine, talk to your doctor, pharmacist, or health care provider.  2018 Elsevier/Gold Standard (2016-04-14 18:16:30)

## 2018-01-02 NOTE — Progress Notes (Signed)
SLEEP MEDICINE CLINIC   Provider:  Larey Seat, M D  Referring Provider: Marin Olp, MD Primary Care Physician:  Marin Olp, MD  Chief Complaint  Patient presents with  . Follow-up    pt with husband. rm . pt states things have been going well she does state that the Xyrem feels like it is becoming less effective.     HPI: Debra Nelson is a 37 y.o. female , seen here as a revisit from Dr. Yong Channel for treatment of hypersomnia, HLA positive , diagnosed with Narcolepsy. Debra Nelson has been titrated to Xyrem which has given her final several years of good control of hypersomnia.  Originally I had titrated her to the maximum dose of 4.5 g twice at night but she began to sleep walk and had other parasomnia activity, the dose was reduced to 3 g twice at night under which she did well for the last 18 months.  She now reports that she is not sleeping nearly as deep, she has breakthrough dream activity and her husband confirms that it is easier to wake her than it used to be.  Since she has room to further increase the dose we can go from here to at least 3.5 g twice at night and see if further adjustments are necessary.  I would like this process to go slow as not to reintroduce sleepwalking. The fatigue severity score was endorsed at 42 points and the Epworth sleepiness score at 8 points.  This is still a great effect in comparison to the untreated Epworth sleepiness score.   Today is 07/02/2017,And as the pleasure of seeing [name] in the presence of her young daughter. The patient endorses only 3 points on her Epworth Sleepiness Scale today, 22% on her fatigue score and is doing very well in terms of control of sleepiness, cataplexy and narcolepsy. She has lost weight. She finally got off Soda. She is not employed right now.  Her husband was retired from the police due to DDD.    08-31-2016  Debra Nelson is a 37 y.o. female , seen here as a revisit from Dr. Yong Channel  for treatment of hypersomnia, HLA positive , diagnosed with Narcolepsy and cataplexy . The patient developed sleepwalking episodes on Xyrem, but she could not find control of her narcolepsy and cataplexy without the medication. For this reason she has restarted apnea monitoring her closely. Her fatigue severity scale today is 47, Epworth sleepiness score is 6 points on Xyrem with armodafinil . The Debra Nelson has now been again covered by her insurance not longer on Tofranil-.Mood overall is good, less variability, not euphoric.Not suicidal - no intent , no plan.   Debra Nelson reports morning headaches frequently now, my concern is that Xyrem may induce hypoventilation that she could be a CO2 retainer. 3 gram of Xyrem has controlled sleepiness and not allowed sleepwaling, but still has vivid dreams.   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  She has started to sleep walk almost nightly and severely. The discussion as to discontinue the Xyrem and rely modafinil only- but that would not affect her cataplexy. Currently she has not taken Xyrem for 12 days but is not sleep walking, but her cataplexy is poorly controlled. I would like very much for her to go back on Xyrem but it  has to be in a safe environment for she can be watched not vice versa. I will give her Tofranil today to control the cataplexy as long as she is not able to go back on Xyrem and I would refill modafinil.-Nuvigil. This helped a lot in day time. ( Due to parasomnia activity Xyrem is currently on hold. Her husband needs to be back on his feet before we can stop the medication again I also will give her Tofranil to help control cataplexy be continued Nuvigil in daytime. They can change the dosing of Xyrem to the first dose being given at 4.5 g at night and the second dose to be 2 or even 3 g less.  Sometimes this will help to prevent parasomnias in the morning hours. I would also like for her to have a motion detector at the door of the bedroom so that she may wake up or others are alerted to her activity. I would like to recommend her to lock all doors and especially keeps a car keys in a pocket or a drawer but they are not visible)  Chief complaint according to patient :" Recurrent hypersomnia "after the patient's medications were no longer approved by insurance.  She was changed from Nuvigil to Provigil and has not responded well.The patient is an HLA positive narcoleptic with co-morbidities of  Bipolar disorder. This limits the use of stimulants in her treatment.  She was changed from [place] to [place] by Psychiatry, and her anxiety level was lower than it had been in a long time.The patient felt that her psychiatric condition has been very well controlled with the new LATUDA  Medication- her anxiety level decreased she was much less irritable. Shortly after that  the medication Nuvigil was not longer covered and she is now sleepier and less attentive.  Sleep studies : see media. The patient has been on Xyrem, and lost weight in the process.   Interval 10-19-15, [name] reports that she had a good, but very busy Thanksgiving and that she has been the main caretaker of her husband , who is recovering from her surgery. Overall she has lost more weight since being on Xyrem. Her insurance stopped covering the Nuvigil as I had reported at her last visit she was changed from Nuvigil to Provigil but had not responded as well and especially not to the generic form. She has noted that it takes her longer to fall asleep on Xyrem.  She needs to get back on Nuvigil for daytime sleepiness. Propranolol seems to work less for headache prophylaxis. The recurrence of headaches can also be related to the discontinuation of Nuvigil. We discussed today that the medication can be imported from [place].   Interval  history form 01-17-16. Patient feels well, has less daytime sleepiness and no cataplexy. She was on pain medications and could not take XYREM at the same time, restarted XYREM just 3 weeks ago- now sleep walking , Parasomnia.  She dos not drink ETOH, not longer on pain medication.   She is taking 3 gram first dose and 4.5 second dose.  I will increase to 3.5 and 4.5 gram.  Social history: married, 2 children with ADD. One daughter from her first marriage, and a stepson with a learning disability form her husbands site.  She has had recent labs- elevated sodium , at 109- likely Xyrem related.  Potassium 5.9. Dated 10-27-15.    Review of Systems: 01-17-16 Out of a complete 14 system review, the patient complains of  only the following symptoms, and all other reviewed systems are negative.  Fatigue severity score  Again 54 from 41 on xyrem  from 53, before xyrem     depression score PHQ 9 :  How likely are you to doze in the following situations: 0 = not likely, 1 = slight chance, 2 = moderate chance, 3 = high chance  Sitting and Reading? 1 Watching Television?2 Sitting inactive in a public place (theater or meeting)?1 As a passenger in a car for an hour without a break? 0 Lying down in the afternoon when circumstances permit? 3 Sitting and talking to someone?0 Sitting quietly after lunch without alcohol? 1 In a car, while stopped for a few minutes in traffic?0   Total = 8 on Xyrem    Recurrent dreams, no cataplexy.      Social History   Socioeconomic History  . Marital status: Married    Spouse name: [name]  . Number of children: 4  . Years of education: College  . Highest education level: Not on file  Social Needs  . Financial resource strain: Not on file  . Food insecurity - worry: Not on file  . Food insecurity - inability: Not on file  . Transportation needs - medical: Not on file  . Transportation needs - non-medical: Not on file  Occupational History    Employer:  OTHER  Tobacco Use  . Smoking status: Never Smoker  . Smokeless tobacco: Never Used  Substance and Sexual Activity  . Alcohol use: No  . Drug use: No  . Sexual activity: Yes    Partners: Male    Birth control/protection: Pill  Other Topics Concern  . Not on file  Social History Narrative   Patient is married ([name]) and lives at home with her family   4 adults - 2 kids 50% of the time (1 son, 1 daughter). 2 older children (one age 77 from rape, then 59 year old- does not get to see- related to bipolar) Moved in with in laws to help them      Patient has 4 children (3 of her own and 1 step son).    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   Patient is right-handed.    Family History  Problem Relation Age of Onset  . Mental illness Mother        does not speak to regularly so does not know full history  . Other Father        medical issues, divorce related  . Other Brother        ptsd- fully discharged medically disabled before age 80  . Cancer Paternal Grandfather     Past Medical History:  Diagnosis Date  . Asthma   . Bipolar disorder with depression ([place])    and PTSD  . Chicken pox   . Deviated septum   . DJD (degenerative joint disease)    Severe of the right knee-Dr Creighton-ortho and Dr Gean Birchwood, PA-preferred pain managment  . GERD (gastroesophageal reflux disease)   . Seasonal allergic rhinitis     Past Surgical History:  Procedure Laterality Date  . knee surgery x5     08,11, 12, 13 x2- R knee  . right foot surgery     arthritis/bone spurus. plate placed.   . WISDOM TOOTH EXTRACTION      Current  Outpatient Medications  Medication Sig Dispense Refill  . albuterol (2.5 MG/3ML) 0.083% NEBU 3 mL, albuterol (5 MG/ML) 0.5% NEBU 0.5 mL Inhale into the lungs daily as needed.    Marland Kitchen albuterol (PROAIR HFA) 108 (90 Base) MCG/ACT inhaler Inhale 2 puffs into  the lungs every 6 (six) hours as needed for wheezing or shortness of breath.    . calcium-vitamin D 250-100 MG-UNIT tablet Take 1 tablet by mouth daily.    . Diclofenac Sodium (PENNSAID) 2 % SOLN Place 1 application onto the skin 2 (two) times daily. 112 g 2  . eletriptan (RELPAX) 40 MG tablet Take 1 tablet (40 mg total) by mouth as needed for migraine or headache. One tablet by mouth at onset of headache. May repeat in 2 hours if headache persists or recurs. 18 tablet 3  . EPIPEN 2-PAK 0.3 MG/0.3ML SOAJ injection as needed.    . hydrOXYzine (VISTARIL) 50 MG capsule Take 1 capsule (50 mg total) by mouth 3 (three) times daily as needed. 90 capsule 1  . L-Methylfolate 15 MG TABS Take 1 tablet (15 mg total) by mouth daily. 90 tablet 1  . lamoTRIgine (LAMICTAL) 100 MG tablet Take 1 tablet (100 mg total) by mouth 2 (two) times daily. 180 tablet 1  . lithium carbonate (LITHOBID) 300 MG CR tablet Take 1 tablet (300 mg total) by mouth daily. 90 tablet 1  . lurasidone (LATUDA) 80 MG TABS tablet Take 1 tablet (80 mg total) by mouth daily with breakfast. 90 tablet 1  . montelukast (SINGULAIR) 10 MG tablet Take 10 mg by mouth at bedtime.    . Multiple Vitamin (MULTIVITAMIN) tablet Take 1 tablet by mouth daily.    . Multiple Vitamins-Minerals (HAIR SKIN AND NAILS FORMULA PO) Take 2 tablets by mouth daily.    Marland Kitchen OVER THE COUNTER MEDICATION Biotin, vitamin E, Calcium D, cranberry, vitamin c    . promethazine (PHENERGAN) 25 MG tablet Take 1 tablet (25 mg total) by mouth every 6 (six) hours as needed for nausea or vomiting. 30 tablet 3  . TURMERIC PO Take 1 tablet by mouth daily.    Debra Nelson 500 MG/ML SOLN Take 6 mLs (3,000 mg total) by mouth daily after supper. 3.0  grams po for first dose and 3.0 grams for second dose nightly as directed 270 mL 5  . vitamin E 1000 UNIT capsule Take by mouth.     Current Facility-Administered Medications  Medication Dose Route Frequency Provider Last Rate Last Dose  . diclofenac  sodium (VOLTAREN) 1 % transdermal gel 2 g  2 g Topical QID PRN Dwana Melena L, PA-C        Allergies as of 01/02/2018 - Review Complete 01/02/2018  Allergen Reaction Noted  . Klonopin [clonazepam] Anaphylaxis and Nausea Only 12/19/2013  . Other Other (See Comments) 03/07/2013  . Phenergan [promethazine hcl]  12/26/2011  . Promethazine Other (See Comments) 03/07/2013  . Promethazine hcl Other (See Comments) 12/26/2011  . Topamax [topiramate] Nausea And Vomiting 12/19/2013   Sodium 135 - 145 mEq/L 138   Potassium 3.5 - 5.1 mEq/L 4.1   Chloride 96 - 112 mEq/L 103   CO2 19 - 32 mEq/L 29   Glucose, Bld 70 - 99 mg/dL 102 Abnormally high    BUN 6 - 23 mg/dL 8   Creatinine, Ser 0.40 - 1.20 mg/dL 0.85   Total Bilirubin 0.2 - 1.2 mg/dL 0.3   Alkaline Phosphatase 39 - 117 U/L 71   AST 0 - 37 U/L  13   ALT 0 - 35 U/L 14   Total Protein 6.0 - 8.3 g/dL 7.4   Albumin 3.5 - 5.2 g/dL 4.4   Calcium 8.4 - 10.5 mg/dL 9.8   GFR >60.00 mL/min 79.21     Vitals: BP 117/80   Pulse 90   Ht 5' 2" (1.575 m)   Wt 143 lb (64.9 kg)   BMI 26.16 kg/m  Last Weight:  Wt Readings from Last 1 Encounters:  01/02/18 143 lb (64.9 kg)   QIW:LNLG mass index is 26.16 kg/m.     Last Height:   Ht Readings from Last 1 Encounters:  01/02/18 5' 2" (1.575 m)    Physical exam:  General: The patient is awake, alert and appears relaxed. The patient is well groomed. Head: Normocephalic, atraumatic. Neck  - paraspinal tenderness.  Mallampati 2- crowded lower jaw,  neck circumference:14 inches  . Nasal airflow unrestricted , TMJ click not evident . Retrognathia is not seen.  Cardiovascular:  Regular rate and rhythm. Respiratory:  clear  Skin:  Without evidence of edema, or rash Trunk: BMI is elevated . The patient's posture is erect.    Neurologic exam : The patient is awake and alert, oriented to place and time.    Memory subjective described as intact.   Speech is not pressured, is fluent, without  dysarthria, dysphonia. No tremor in her voice.  Mood and affect are appropriate, she is conversant and seems happy. Attention is normal.   Taste and smell are reportedly intact.  Pupils are equal and briskly reactive to light. Extraocular movements  in vertical and horizontal planes intact and without nystagmus. Visual fields by finger perimetry are intact. Facial motor strength is symmetric and tongue and uvula move midline. Shoulder shrug was symmetrical.  Motor: intact strength, no focal weakness, symmetric muscle bulk and tone. Sensory: Intact to primary modalities  The patient was advised of the nature of the diagnosed Narcolepsy sleep disorder , the treatment with XYREM and risks for general a health and wellness arising from not treating the condition.  The patient has also migraines, but infrequently.  Her insurance does no longer cover Relpax, I will look for alternative triptan medications.  I spent more than 25 minutes of face to face time with the patient. Greater than 50% of time was spent in counseling and coordination of care. We have discussed the diagnosis and differential and I answered the patient's questions.    Parasomnia/ sleep walking  activity on XYREM has ceased after the dose was reduced form 4 to 3 gram twice at night. Now Xyrem is less effective. Will increase to 3.5 gram/.    Assessment:  After physical and neurologic examination, review of laboratory studies,  Personal review of imaging studies, reports of other /same  Imaging studies ,   Results of polysomnography/ neurophysiology testing and pre-existing records as far as provided in visit.,  my assessment is:   1) Narcolepsy confirmed by HLA -  XYREM and NUVIGIL controlled her hypersomnia and Latuda controlled her anxiety.   XYREM has alleviated her cataplexy  .  No need for tofranil.  Increase to 3.5 gram twice nightly.    2) Migraine - less frequent asince she sleeps better and lost weight.  Currently  normal BMI after XYREM induced weight loss.    Plan:  Treatment plan and additional workup :  Larey Seat MD  01/02/2018   CC: Marin Olp, Md [address] [place], [place] [address]

## 2018-01-08 ENCOUNTER — Encounter (HOSPITAL_COMMUNITY): Payer: Self-pay | Admitting: Psychiatry

## 2018-01-09 ENCOUNTER — Other Ambulatory Visit (HOSPITAL_COMMUNITY): Payer: Self-pay | Admitting: Psychiatry

## 2018-01-09 MED ORDER — LURASIDONE HCL 120 MG PO TABS: 120.0000 mg | ORAL_TABLET | Freq: Every day | ORAL | 0 refills | Status: DC

## 2018-01-10 ENCOUNTER — Encounter (HOSPITAL_COMMUNITY): Payer: Self-pay | Admitting: Psychiatry

## 2018-01-29 ENCOUNTER — Encounter: Payer: Self-pay | Admitting: Sports Medicine

## 2018-01-29 ENCOUNTER — Ambulatory Visit (INDEPENDENT_AMBULATORY_CARE_PROVIDER_SITE_OTHER): Payer: BLUE CROSS/BLUE SHIELD | Admitting: Sports Medicine

## 2018-01-29 VITALS — BP 120/84 | HR 88 | Ht 62.0 in | Wt 139.2 lb

## 2018-01-29 DIAGNOSIS — M79645 Pain in left finger(s): Secondary | ICD-10-CM | POA: Diagnosis not present

## 2018-01-29 DIAGNOSIS — M130 Polyarthritis, unspecified: Secondary | ICD-10-CM | POA: Diagnosis not present

## 2018-01-29 DIAGNOSIS — M546 Pain in thoracic spine: Secondary | ICD-10-CM

## 2018-01-29 DIAGNOSIS — M9905 Segmental and somatic dysfunction of pelvic region: Secondary | ICD-10-CM

## 2018-01-29 DIAGNOSIS — M9902 Segmental and somatic dysfunction of thoracic region: Secondary | ICD-10-CM

## 2018-01-29 DIAGNOSIS — M79644 Pain in right finger(s): Secondary | ICD-10-CM | POA: Diagnosis not present

## 2018-01-29 NOTE — Progress Notes (Signed)
  OBJECTIVE:  VS:  HT:5\' 2"  (157.5 cm)   WT:139 lb 3.2 oz (63.1 kg)  BMI:25.45    BP:120/84  HR:88bpm  TEMP: ( )  RESP:98 %   PHYSICAL EXAM: WDWN, Non-toxic appearing. Psychiatric: Alert & appropriately interactive.  Not depressed or anxious appearing. Respiratory: No increased work of breathing.  Trachea Midline Eyes: Pupils are equal.  EOM intact without nystagmus.  No scleral icterus  NEUROVASCULAR exam: No clubbing or cyanosis appreciated No significant venous stasis changes normal, less than 2 seconds   Bilateral lower extremities: Well aligned.  She has good internal and external rotation of both hips.  Sinus compression test cervical and symmetric.  Bilateral hands have improved range of motion of the thumbs without overt triggering appreciated at this time.  She has pain with palpation of the IP joint of the right thumb but otherwise pain-free at the [facility] and IP joints of bilateral hands.  Grip strength is intact.  Normal range of motion.  Small effusion of the IP joint but this is minimal.  Otherwise please see osteopathic exam.  ASSESSMENT & PLAN:   1. Acute bilateral thoracic back pain   2. Bilateral thumb pain   3. Polyarthropathy involving hand   4. Somatic dysfunction of thoracic region   5. Somatic dysfunction of pelvis region    Orders & Meds: No orders of the defined types were placed in this encounter.  No orders of the defined types were placed in this encounter.    PLAN: Thumb pain that she has had has significantly improved following injections and with topical anti-inflammatories.  She can continue with these at this time she was tolerated well without adverse effect.  I would like for her to continue with the topical Voltaren twice daily for the next week to see if we get some improved management of her swelling.  Additionally for her chronic thoracic back pain osteopathic manipulation was performed today and home therapeutic exercises were prescribed per  procedure note.  She does have limitations in her thoracic mobility and I am happy to see her back at any time for repeat manipulation.  No problem-specific Assessment & Plan notes found for this encounter.   Follow-up: Return if symptoms worsen or fail to improve.

## 2018-01-29 NOTE — Procedures (Signed)
PROCEDURE NOTE : OSTEOPATHIC MANIPULATION The decision today to treat with Osteopathic Manipulative Therapy (OMT) was based on physical exam findings. Verbal consent was obtained following a discussion with the patient regarding the of risks, benefits and potential side effects, including an acute pain flare,post manipulation soreness and need for repeat treatments.     NONE  Manipulation was performed as below: Regions Treated: Ribs and thoracic spine Techniques used: HVLA and myofascial release   The patient tolerated the treatment well and reported Improved symptoms following treatment today. Patient was given medications, exercises, stretches and lifestyle modifications per AVS and verbally.     OSTEOPATHIC/STRUCTURAL EXAM FINDINGS:    SOMATIC DYSFUNCTION         Thoracic: Significant restrictions of thoracic rotation,  T2 through T6 neutral, rotated right, side bent left T8 through T10 neutral rotated left, side bent right              Pelvis: Right anterior innonimate

## 2018-01-29 NOTE — Progress Notes (Addendum)
  Debra Nelson. Debra Nelson, [place] [facility] [place] - 37 y.o. female MRN [id number]  Date of birth: 1980-12-03  Visit Date: 01/29/2018  PCP: Marin Olp, MD   Referred by: Marin Olp, MD   Scribe for today's visit: Josepha Pigg, CMA     SUBJECTIVE:  Debra Nelson is here for Follow-up (bilateral thumb pain)  12/18/17: Her bilateral thumb pain symptoms INITIALLY: Began about mid 10/2017 and MOI is unknown. She has hx of trigger thumb, received inj in LT thumb.  Described as moderate (varies from mild to severe from day to day) sharp pain with flexion, nonradiating Worsened with flexion, clapping Improved with rest Additional associated symptoms include: She has noticed swelling in the RT thumb.  At this time symptoms are improving compared to onset, pain comes and goes and varied in intensity.  She has been taking Meloxicam and got minimal relief, it also caused GI upset. She was prescribed Voltaren but was unable to start it d/t PA being required. She has tried wearing thumb spica with some relief. She has tried applying heat with some relief and ice with minimal relief.   01/29/18: Compared to the last office visit, her previously described symptoms are improving, pain is less severe and occurs less often. There is no specific trigger for the pain, it "randomly comes and goes".  Current symptoms are mild & are nonradiating She had been taking Meloxicam but it caused GI upset. She has used thumb spica in the past and well as heat and ice. She was prescribed topical Pennsaid which she feels is beneficial.   She had lab drawn 12/18/17, relatively normal.    ROS Denies night time disturbances. Denies fevers, chills, or night sweats. Denies unexplained weight loss. Denies personal history of cancer. Denies changes in bowel or bladder habits. Denies recent unreported falls. Denies new or worsening dyspnea  or wheezing. Reports headaches.  Denies numbness, tingling or weakness  In the extremities.  Denies dizziness or presyncopal episodes Denies lower extremity edema    HISTORY & PERTINENT PRIOR DATA:  Prior History reviewed and updated per electronic medical record.  Significant history, findings, studies and interim changes include:  reports that  has never smoked. she has never used smokeless tobacco. Recent Labs    12/18/17 0943  LABURIC 3.4   No specialty comments available. No problems updated.    Please see additional documentation for Objective, Assessment and Plan sections. Pertinent additional documentation may be included in corresponding procedure notes, imaging studies, problem based documentation and patient instructions. Please see these sections of the encounter for additional information regarding this visit.  CMA/ATC served as Education administrator during this visit. History, Physical, and Plan performed by medical provider. Documentation and orders reviewed and attested to.      Gerda Diss, [place] [facility] Physician

## 2018-01-29 NOTE — Procedures (Signed)
PROCEDURE NOTE: THERAPEUTIC EXERCISES (97110) 15 minutes spent for Therapeutic exercises as below and as referenced in the AVS. This included exercises focusing on stretching, strengthening, with significant focus on eccentric aspects.  Proper technique shown and discussed handout in great detail with ATC. All questions were discussed and answered.   Long term goals include an improvement in range of motion, strength, endurance as well as avoiding reinjury. Frequency of visits is one time as determined during today's  office visit. Frequency of exercises to be performed is as per handout.  EXERCISES REVIEWED:  hip abduction itb stretching Archie Balboa / [employer]  thoracic rotation

## 2018-01-29 NOTE — Patient Instructions (Signed)
Also check out [employer]" which is a program developed by Dr. Minerva Ends.   There are links to a couple of his YouTube Videos below and I would like to see performing one of his videos 5-6 days per week.    A good intro video is: "Independence from Pain 7-minute Video" - [email or web address]   His more advanced video is: "Powerful Posture and Pain Relief: 12 minutes of Foundation Training" - https://youtu.[email or web address]  Do not try to attempt this entire video when first beginning.    Try breaking of each exercise that he goes into shorter segments.  Otherwise if they perform an exercise for 45 seconds, start with 15 seconds and rest and then resume when they begin the new activity.    If you work your way up to doing this 12 minute video, I expect you will see significant improvements in your pain.  If you enjoy his videos and would like to find out more you can look on his website: [email or web address]  He has a workout streaming option as well as a DVD set available for purchase.  Amazon has the best price for his DVDs.     Please perform the exercise program that we have prepared for you and gone over in detail on a daily basis.  In addition to the handout you were provided you can access your program through: [email or web address]   Your unique program code is: [facility]

## 2018-01-30 ENCOUNTER — Encounter (HOSPITAL_COMMUNITY): Payer: Self-pay | Admitting: Psychiatry

## 2018-01-30 ENCOUNTER — Ambulatory Visit (HOSPITAL_COMMUNITY): Payer: BLUE CROSS/BLUE SHIELD | Admitting: Psychiatry

## 2018-01-30 VITALS — BP 118/70 | HR 94 | Ht 62.0 in | Wt 140.0 lb

## 2018-01-30 DIAGNOSIS — F4312 Post-traumatic stress disorder, chronic: Secondary | ICD-10-CM | POA: Diagnosis not present

## 2018-01-30 DIAGNOSIS — Z5181 Encounter for therapeutic drug level monitoring: Secondary | ICD-10-CM | POA: Diagnosis not present

## 2018-01-30 DIAGNOSIS — F3160 Bipolar disorder, current episode mixed, unspecified: Secondary | ICD-10-CM | POA: Diagnosis not present

## 2018-01-30 DIAGNOSIS — Z818 Family history of other mental and behavioral disorders: Secondary | ICD-10-CM

## 2018-01-30 MED ORDER — LORAZEPAM 1 MG PO TABS: 1.0000 mg | ORAL_TABLET | Freq: Two times a day (BID) | ORAL | 2 refills | Status: DC

## 2018-01-30 MED ORDER — CARIPRAZINE HCL 4.5 MG PO CAPS: 4.5000 mg | ORAL_CAPSULE | ORAL | 1 refills | Status: DC

## 2018-01-30 NOTE — Patient Instructions (Addendum)
STOP L-methylfolate  STOP Latuda  STOP Vistaril / hydroxyzine  CONTINUE Lamictal and Lithium as they are  START Vraylar 1.5 mg x 1 week, then 3 mg x 1 week, then 4.5 mg daily -  I will send in the 4.5 mg tablet to pharmacy  Take Ativan 1 mg in the morning and at lunch as needed - I would suggest at least taking them SCHEDULED for the next week until vraylar is up to the 3 mg dose

## 2018-01-30 NOTE — Progress Notes (Signed)
[place] MD/PA/NP OP Progress Note  01/30/2018 9:19 AM Debra Nelson  MRN:  [id number]  Chief Complaint:  I'm losing my mind  HPI: Debra Nelson continues to struggle with irritability, anxiety, depression, poor motivation, tearfulness, frustration with others.  She denies any thoughts to hurt herself or others, but feels frustrated that the [place] has not helped.  I spent time with her again reiterating my concern that she presents with mixed symptoms.  I have previously recommended we consider cariprizine, she is now agreeable to a trial.  She reports that she is not sure if the Latuda could be contributing to nausea.  I discussed my recommendation that we simplify her medication regimen substantially.  Suggested the following changes.  Discontinue Latuda, discontinue l-methylfolate, discontinue hydroxyzine.  Suggested we continue the lithium and Lamictal as they are, and initiate cariprazine.  Spent time reviewing the risks and benefits of atypical antipsychotic.  We agreed to obtain laboratory monitoring today as well for lithium and antipsychotic monitoring.  Discussed that Ativan may be useful in the interim for episodic anxiety and agitation.  Educated her that this is habit forming and should only be used on an as-needed basis.  Recommended that she take scheduled twice a day for the next week until carpirazine has been able to get up to 3 mg dose.  She agrees to follow-up as scheduled in May.  No other acute concerns at this time  Visit Diagnosis:    ICD-10-CM   1. Bipolar I disorder, most recent episode mixed ([place]) F31.60 Cariprazine HCl (VRAYLAR) 4.5 MG CAPS    LORazepam (ATIVAN) 1 MG tablet  2. Chronic post-traumatic stress disorder (PTSD) F43.12   3. Medication monitoring encounter Z51.81     Past Psychiatric History: See intake H&P for full details. Reviewed, with no updates at this time.   Past Medical History:  Past Medical History:  Diagnosis Date  . Asthma   . Bipolar  disorder with depression ([place])    and PTSD  . Chicken pox   . Deviated septum   . DJD (degenerative joint disease)    Severe of the right knee-Dr Creighton-ortho and Dr Gean Birchwood, PA-preferred pain managment  . GERD (gastroesophageal reflux disease)   . Seasonal allergic rhinitis     Past Surgical History:  Procedure Laterality Date  . knee surgery x5     08,11, 12, 13 x2- R knee  . right foot surgery     arthritis/bone spurus. plate placed.   . WISDOM TOOTH EXTRACTION      Family Psychiatric History: See intake H&P for full details. Reviewed, with no updates at this time.   Family History:  Family History  Problem Relation Age of Onset  . Mental illness Mother        does not speak to regularly so does not know full history  . Other Father        medical issues, divorce related  . Other Brother        ptsd- fully discharged medically disabled before age 41  . Cancer Paternal Grandfather     Social History:  Social History   Socioeconomic History  . Marital status: Married    Spouse name: [name]  . Number of children: 4  . Years of education: College  . Highest education level: None  Social Needs  . Financial resource strain: None  . Food insecurity - worry: None  . Food insecurity - inability: None  . Transportation needs - medical: None  .  Transportation needs - non-medical: None  Occupational History    Employer: OTHER  Tobacco Use  . Smoking status: Never Smoker  . Smokeless tobacco: Never Used  Substance and Sexual Activity  . Alcohol use: No  . Drug use: No  . Sexual activity: Yes    Partners: Male    Birth control/protection: Pill  Other Topics Concern  . 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 2 older children (one age 89 from rape, then 49 year old- does not get to see- related to bipolar) Moved in with in laws to help them       Patient has 4 children (3 of her own and 1 step son).    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   Patient is right-handed.    Allergies:  Allergies  Allergen Reactions  . Other Other (See Comments)  . Phenergan [Promethazine Hcl]     IV causes muscle tics, may take oral, or give benadryl prior to IV  . Promethazine Other (See Comments)    She cannot take IV phenergan unless this it is given with Benadryl.  . Promethazine Hcl Other (See Comments)    IV causes muscle tics, may take oral, or give benadryl prior to IV  . Topamax [Topiramate] Nausea And Vomiting    confusion  . Klonopin [Clonazepam] Nausea Only    Metabolic Disorder Labs: No results found for: HGBA1C, MPG No results found for: PROLACTIN No results found for: CHOL, TRIG, HDL, CHOLHDL, VLDL, LDLCALC Lab Results  Component Value Date   TSH 5.460 (H) 04/12/2017    Therapeutic Level Labs: Lab Results  Component Value Date   LITHIUM 0.6 04/12/2017   LITHIUM 0.7 08/31/2016   No results found for: VALPROATE No components found for:  CBMZ  Current Medications: Current Outpatient Medications  Medication Sig Dispense Refill  . albuterol (2.5 MG/3ML) 0.083% NEBU 3 mL, albuterol (5 MG/ML) 0.5% NEBU 0.5 mL Inhale into the lungs daily as needed.    Marland Kitchen albuterol (PROAIR HFA) 108 (90 Base) MCG/ACT inhaler Inhale 2 puffs into the lungs every 6 (six) hours as needed for wheezing or shortness of breath.    . Armodafinil 150 MG tablet Take 1 tablet (150 mg total) by mouth daily. 30 tablet 5  . calcium-vitamin D 250-100 MG-UNIT tablet Take 1 tablet by mouth daily.    . Diclofenac Sodium (PENNSAID) 2 % SOLN Place 1 application onto the skin 2 (two) times daily. 112 g 2  . EPIPEN 2-PAK 0.3 MG/0.3ML SOAJ injection as needed.    . lamoTRIgine (LAMICTAL) 100 MG tablet Take 1 tablet (100 mg total) by mouth 2 (two) times daily. 180  tablet 1  . lithium carbonate (LITHOBID) 300 MG CR tablet Take 1 tablet (300 mg total) by mouth daily. 90 tablet 1  . montelukast (SINGULAIR) 10 MG tablet Take 10 mg by mouth at bedtime.    . Multiple Vitamin (MULTIVITAMIN) tablet Take 1 tablet by mouth daily.    . Multiple Vitamins-Minerals (HAIR SKIN AND NAILS FORMULA PO) Take 2 tablets by mouth daily.    Marland Kitchen OVER THE COUNTER MEDICATION Biotin, vitamin E, Calcium D, cranberry, vitamin c    . promethazine (PHENERGAN) 25  MG tablet Take 1 tablet (25 mg total) by mouth every 6 (six) hours as needed for nausea or vomiting. 30 tablet 3  . SUMAtriptan (IMITREX) 100 MG tablet Take 0.5-1 tablets (50-100 mg total) by mouth every 2 (two) hours as needed for migraine. May repeat in 2 hours if headache persists or recurs. 10 tablet 2  . TURMERIC PO Take 1 tablet by mouth daily.    . vitamin E 1000 UNIT capsule Take by mouth.    Gust Brooms 500 MG/ML SOLN 3.5   grams po for first dose and 3.5 grams for second dose nightly as directed 270 mL 5  . Cariprazine HCl (VRAYLAR) 4.5 MG CAPS Take 1 capsule (4.5 mg total) by mouth every morning. 90 capsule 1  . LORazepam (ATIVAN) 1 MG tablet Take 1 tablet (1 mg total) by mouth 2 (two) times daily. 60 tablet 2   Current Facility-Administered Medications  Medication Dose Route Frequency Provider Last Rate Last Dose  . diclofenac sodium (VOLTAREN) 1 % transdermal gel 2 g  2 g Topical QID PRN Aundra Dubin, PA-C         Musculoskeletal: Strength & Muscle Tone: within normal limits Gait & Station: normal Patient leans: N/A  Psychiatric Specialty Exam: ROS  Blood pressure 118/70, pulse 94, height 5\' 2"  (1.575 m), weight 140 lb (63.5 kg).Body mass index is 25.61 kg/m.  General Appearance: Casual and Fairly Groomed  Eye Contact:  Fair  Speech:  Normal Rate  Volume:  Normal  Mood:  Anxious and Irritable  Affect:  Congruent  Thought Process:  Goal Directed and Descriptions of Associations: Intact  Orientation:   Full (Time, Place, and Person)  Thought Content: Logical   Suicidal Thoughts:  No  Homicidal Thoughts:  No  Memory:  Immediate;   Fair  Judgement:  Good  Insight:  Fair  Psychomotor Activity:  Normal  Concentration:  Concentration: Good  Recall:  Good  Fund of Knowledge: Good  Language: Good  Akathisia:  Negative  Handed:  Right  AIMS (if indicated): not done  Assets:  Communication Skills Desire for Improvement Financial Resources/Insurance Housing  ADL's:  Intact  Cognition: WNL  Sleep:  Fair   Screenings: PHQ2-9     Office Visit from 04/07/2015 in [facility]  PHQ-2 Total Score  4  PHQ-9 Total Score  14       Assessment and Plan:  Debra Nelson presents with ongoing mood lability, concerning for mixed episode.  Spent time discussing transition from lurasidone to cariprazine.  Spent time discussing the risks and benefits of atypical antipsychotic, and the need for laboratory monitoring.  Reiterated that these medications do take some time to work and for maximal effect to be seen.  Suggested that we use lorazepam in the meantime to help with anxiety and agitation.  Educated her that this should not be taken with Xyrem under any circumstances.  She denies any acute suicidality or aggressive intentions towards others.  She is largely concerned with helping her racing thoughts slow down, and improving her irritability, depression, and tearfulness.  We will follow-up in 6-8 weeks as scheduled.   1. Bipolar I disorder, most recent episode mixed ([place])   2. Chronic post-traumatic stress disorder (PTSD)   3. Medication monitoring encounter     Status of current problems: unchanged  Labs Ordered: No orders of the defined types were placed in this encounter.   Labs Reviewed: obtaining today  Collateral Obtained/Records Reviewed: na  Plan:  Continue Lamictal 100 mg twice a day; may consider increasing to supratherapeutic dose 150 mg twice a  day Continue lithium 300 mg daily; given her thyroid abnormalities in the past, we may consider tapering and discontinuing if she has a good response to cariprazine Initiate cariprazine 1.5 mg x 1 week, then 3 mg x 1 week, then 4.5 mg daily Latuda discontinued given lack of benefit and GI intolerance L-methylfolate discontinued given concern that this is contributing to nausea Vistaril discontinued given inadequate anxiety reduction Return to clinic in 6-8 weeks Laboratory studies as ordered for today  I spent 25 minutes with the patient in direct face-to-face clinical care.  Greater than 50% of this time was spent in counseling and coordination of care with the patient.    Aundra Dubin, MD 01/30/2018, 9:19 AM

## 2018-01-30 NOTE — Addendum Note (Signed)
Addended by: Dennie Maizes E on: 01/30/2018 09:30 AM   Modules accepted: Orders

## 2018-01-31 ENCOUNTER — Telehealth (HOSPITAL_COMMUNITY): Payer: Self-pay

## 2018-01-31 ENCOUNTER — Encounter (HOSPITAL_COMMUNITY): Payer: Self-pay | Admitting: Psychiatry

## 2018-01-31 NOTE — Telephone Encounter (Signed)
Medication management - Prior Authorization for Vraylar initiated online wiht  CoverMyMeds and pending decision.

## 2018-02-04 ENCOUNTER — Encounter: Payer: Self-pay | Admitting: Family Medicine

## 2018-02-04 LAB — CBC WITH DIFFERENTIAL/PLATELET
Basophils Absolute: 0 10*3/uL (ref 0.0–0.2)
Basos: 1 %
EOS (ABSOLUTE): 0 10*3/uL (ref 0.0–0.4)
Eos: 1 %
Hematocrit: 42.6 % (ref 34.0–46.6)
Hemoglobin: 14.1 g/dL (ref 11.1–15.9)
Immature Grans (Abs): 0 10*3/uL (ref 0.0–0.1)
Immature Granulocytes: 0 %
Lymphocytes Absolute: 1.6 10*3/uL (ref 0.7–3.1)
Lymphs: 25 %
MCH: 30.3 pg (ref 26.6–33.0)
MCHC: 33.1 g/dL (ref 31.5–35.7)
MCV: 91 fL (ref 79–97)
Monocytes Absolute: 0.4 10*3/uL (ref 0.1–0.9)
Monocytes: 7 %
Neutrophils Absolute: 4.3 10*3/uL (ref 1.4–7.0)
Neutrophils: 66 %
Platelets: 341 10*3/uL (ref 150–379)
RBC: 4.66 x10E6/uL (ref 3.77–5.28)
RDW: 13.4 % (ref 12.3–15.4)
WBC: 6.4 10*3/uL (ref 3.4–10.8)

## 2018-02-04 LAB — TSH: TSH: 2.38 u[IU]/mL (ref 0.450–4.500)

## 2018-02-04 LAB — COMPREHENSIVE METABOLIC PANEL
ALT: 15 IU/L (ref 0–32)
AST: 17 IU/L (ref 0–40)
Albumin/Globulin Ratio: 2.2 (ref 1.2–2.2)
Albumin: 5 g/dL (ref 3.5–5.5)
Alkaline Phosphatase: 74 IU/L (ref 39–117)
BUN/Creatinine Ratio: 8 — ABNORMAL LOW (ref 9–23)
BUN: 7 mg/dL (ref 6–20)
Bilirubin Total: 0.4 mg/dL (ref 0.0–1.2)
CO2: 24 mmol/L (ref 20–29)
Calcium: 10.3 mg/dL — ABNORMAL HIGH (ref 8.7–10.2)
Chloride: 106 mmol/L (ref 96–106)
Creatinine, Ser: 0.92 mg/dL (ref 0.57–1.00)
GFR calc Af Amer: 91 mL/min/{1.73_m2} (ref >59)
GFR calc non Af Amer: 79 mL/min/{1.73_m2} (ref >59)
Globulin, Total: 2.3 g/dL (ref 1.5–4.5)
Glucose: 110 mg/dL — ABNORMAL HIGH (ref 65–99)
Potassium: 4.4 mmol/L (ref 3.5–5.2)
Sodium: 147 mmol/L — ABNORMAL HIGH (ref 134–144)
Total Protein: 7.3 g/dL (ref 6.0–8.5)

## 2018-02-04 LAB — LIPID PANEL WITH LDL/HDL RATIO
Cholesterol, Total: 170 mg/dL (ref 100–199)
HDL: 57 mg/dL (ref >39)
LDL Calculated: 95 mg/dL (ref 0–99)
LDl/HDL Ratio: 1.7 ratio (ref 0.0–3.2)
Triglycerides: 91 mg/dL (ref 0–149)
VLDL Cholesterol Cal: 18 mg/dL (ref 5–40)

## 2018-02-04 LAB — PROLACTIN: Prolactin: 129.1 ng/mL — ABNORMAL HIGH (ref 4.8–23.3)

## 2018-02-04 LAB — HEMOGLOBIN A1C
Est. average glucose Bld gHb Est-mCnc: 94 mg/dL
Hgb A1c MFr Bld: 4.9 % (ref 4.8–5.6)

## 2018-02-04 LAB — VITAMIN D 1,25 DIHYDROXY
Vitamin D 1, 25 (OH)2 Total: 35 pg/mL
Vitamin D2 1, 25 (OH)2: <10 pg/mL
Vitamin D3 1, 25 (OH)2: 35 pg/mL

## 2018-02-04 LAB — LITHIUM LEVEL: Lithium Lvl: 1.2 mmol/L (ref 0.6–1.2)

## 2018-02-04 LAB — B12 AND FOLATE PANEL
Folate: >20 ng/mL (ref >3.0)
Vitamin B-12: >2000 pg/mL — ABNORMAL HIGH (ref 232–1245)

## 2018-02-04 LAB — T4, FREE: Free T4: 1.68 ng/dL (ref 0.82–1.77)

## 2018-02-05 ENCOUNTER — Encounter: Payer: Self-pay | Admitting: Family Medicine

## 2018-03-05 ENCOUNTER — Telehealth: Payer: Self-pay | Admitting: Neurology

## 2018-03-05 NOTE — Telephone Encounter (Signed)
Called the xyrem pharmacist and made them aware that per Dr Felecia Shelling it would be ok to continue this medication with her new medication change

## 2018-03-05 NOTE — Telephone Encounter (Signed)
This should be ok.  

## 2018-03-05 NOTE — Telephone Encounter (Signed)
-  The pharmacist from [facility] has called to make [place] aware the pt has stated she has had a medication change. The patient is no longer taking latuda and her MD has started her on a medication called Vraylar 4.5 mg, she takes this in the morning. The pharmacist informed/educated the pt that this was a sedating antipsychotic but she does take this in the morning. The xyrem pharmacy just wanted to make [place] aware of this change and make sure Dr Brett Fairy was ok with this. I have informed her that Dr Brett Fairy is out of the office until April 29 th on vacation. I will make another MD that is a sleep specialist aware and see if he has any thoughts to offer and if he thinks its ok for the pt to continue Xyrem. The patient was wanting her medication shipped out tomorrow because she is leaving on vacation. I have informed them I would call back with his advice- after talking with him. Xyrem number is [phone] opt 3 opt 4

## 2018-03-17 ENCOUNTER — Encounter (HOSPITAL_COMMUNITY): Payer: Self-pay | Admitting: Psychiatry

## 2018-03-18 ENCOUNTER — Encounter (HOSPITAL_COMMUNITY): Payer: Self-pay | Admitting: Psychiatry

## 2018-03-21 ENCOUNTER — Encounter (HOSPITAL_COMMUNITY): Payer: Self-pay | Admitting: Psychiatry

## 2018-03-27 ENCOUNTER — Encounter: Payer: Self-pay | Admitting: Neurology

## 2018-03-27 ENCOUNTER — Other Ambulatory Visit: Payer: Self-pay | Admitting: Neurology

## 2018-03-27 MED ORDER — SODIUM OXYBATE 500 MG/ML PO SOLN: ORAL | 0 refills | Status: DC

## 2018-04-01 ENCOUNTER — Ambulatory Visit (INDEPENDENT_AMBULATORY_CARE_PROVIDER_SITE_OTHER): Payer: BLUE CROSS/BLUE SHIELD | Admitting: Family Medicine

## 2018-04-01 ENCOUNTER — Telehealth: Payer: Self-pay

## 2018-04-01 ENCOUNTER — Encounter: Payer: Self-pay | Admitting: Family Medicine

## 2018-04-01 VITALS — BP 118/86 | HR 106 | Temp 98.4°F | Ht 62.0 in | Wt 126.4 lb

## 2018-04-01 DIAGNOSIS — R002 Palpitations: Secondary | ICD-10-CM

## 2018-04-01 DIAGNOSIS — E229 Hyperfunction of pituitary gland, unspecified: Secondary | ICD-10-CM | POA: Diagnosis not present

## 2018-04-01 DIAGNOSIS — R079 Chest pain, unspecified: Secondary | ICD-10-CM

## 2018-04-01 DIAGNOSIS — R7989 Other specified abnormal findings of blood chemistry: Secondary | ICD-10-CM

## 2018-04-01 NOTE — Patient Instructions (Addendum)
Health Maintenance Due  Topic Date Due  . HIV Screening - Patient Declined 02/16/1994  . PAP SMEAR - Our team will call [facility] to have the records faxed to [place]. 03/30/2018   Call in tomorrow morning for a lab visit- I think first visit is 8 15. You can schedule before you leave if anyone at desk  EKG looks ok except heart rate higher than normal  We will call you within a week or two about your referral for holter monitor. If you do not hear within 3 weeks, give [place] a call.

## 2018-04-01 NOTE — Progress Notes (Signed)
Subjective:  Debra Nelson is a 37 y.o. year old very pleasant female patient who presents for/with See problem oriented charting ROS-admits to increased anxiety.  Has had some palpitations.  Short-lived chest pain noticed but not exertional.  Has had periods of shortness of breath relieved by albuterol.  Past Medical History-  Patient Active Problem List   Diagnosis Date Noted  . Chronic post-traumatic stress disorder (PTSD) 02/26/2017    Priority: High  . Narcolepsy cataplexy syndrome 10/19/2015    Priority: High  . Narcolepsy and cataplexy 03/06/2014    Priority: High  . Bipolar II disorder ([place]) 12/31/2013    Priority: High  . Asthma 10/27/2016    Priority: Medium  . GERD (gastroesophageal reflux disease) 10/27/2016    Priority: Medium  . Intractable migraine with aura with status migrainosus 10/19/2015    Priority: Medium  . Sensorineural hearing loss (SNHL) of both ears 12/12/2016    Priority: Low  . Seasonal allergic rhinitis 05/02/2016    Priority: Low  . Right ear pain 05/02/2016    Priority: Low  . Dysfunction of both eustachian tubes 05/02/2016    Priority: Low  . Chondromalacia of knee 03/07/2013    Priority: Low  . Menstrual migraine without status migrainosus, not intractable 01/02/2018  . Chronic thumb pain, bilateral 11/23/2017  . Elevated TSH 05/07/2017  . Trigger thumb, left thumb 03/15/2017    Medications- reviewed and updated Current Outpatient Medications  Medication Sig Dispense Refill  . albuterol (2.5 MG/3ML) 0.083% NEBU 3 mL, albuterol (5 MG/ML) 0.5% NEBU 0.5 mL Inhale into the lungs daily as needed.    Marland Kitchen albuterol (PROAIR HFA) 108 (90 Base) MCG/ACT inhaler Inhale 2 puffs into the lungs every 6 (six) hours as needed for wheezing or shortness of breath.    . Armodafinil 150 MG tablet Take 1 tablet (150 mg total) by mouth daily. 30 tablet 5  . Diclofenac Sodium (PENNSAID) 2 % SOLN Place 1 application onto the skin 2 (two) times daily. 112 g 2  .  EPIPEN 2-PAK 0.3 MG/0.3ML SOAJ injection as needed.    . lamoTRIgine (LAMICTAL) 100 MG tablet Take 1 tablet (100 mg total) by mouth 2 (two) times daily. 180 tablet 1  . lithium carbonate (LITHOBID) 300 MG CR tablet Take 1 tablet (300 mg total) by mouth daily. 90 tablet 1  . LORazepam (ATIVAN) 1 MG tablet Take 1 tablet (1 mg total) by mouth 2 (two) times daily. 60 tablet 2  . montelukast (SINGULAIR) 10 MG tablet Take 10 mg by mouth at bedtime.    . Multiple Vitamins-Minerals (HAIR SKIN AND NAILS FORMULA PO) Take 2 tablets by mouth daily.    . promethazine (PHENERGAN) 25 MG tablet Take 1 tablet (25 mg total) by mouth every 6 (six) hours as needed for nausea or vomiting. 30 tablet 3  . Sodium Oxybate 500 MG/ML SOLN Please take 3 g at bedtime. Repeat 3 g 3-4 hours later/ total dose 6 g nightly 540 mL 0  . SUMAtriptan (IMITREX) 100 MG tablet Take 0.5-1 tablets (50-100 mg total) by mouth every 2 (two) hours as needed for migraine. May repeat in 2 hours if headache persists or recurs. 10 tablet 2  . TURMERIC PO Take 1 tablet by mouth daily.     Current Facility-Administered Medications  Medication Dose Route Frequency Provider Last Rate Last Dose  . diclofenac sodium (VOLTAREN) 1 % transdermal gel 2 g  2 g Topical QID PRN Aundra Dubin, PA-C  Objective: BP 118/86 (BP Location: Left Arm, Patient Position: Sitting, Cuff Size: Normal)   Pulse (!) 106   Temp 98.4 F (36.9 C) (Oral)   Ht 5\' 2"  (1.575 m)   Wt 126 lb 6.4 oz (57.3 kg)   LMP 03/07/2018   SpO2 98%   BMI 23.12 kg/m  Gen: NAD, resting comfortably CV: Slightly tachycardic but regular no murmurs rubs or gallops Lungs: CTAB no crackles, wheeze, rhonchi Abdomen: soft/nontender/nondistended/normal bowel sounds. Ext: no edema Skin: warm, dry Neuro: Normal gait and speech   EKG: sinus tachycardia with rate of 109, normal intervals, no hypertrophy, no st or t wave changes. Unchanged from EKG 12/30/13 except for  tachycardia  Assessment/Plan:  Palpitations - Plan: CBC, Comprehensive metabolic panel, TSH, Holter monitor - 72 hour, EKG (also under chest pain) Prolactin increased ([place]) - Plan: Prolactin S: 3 weeks ago woke up and felt like she couldn't breath. Felt like something sitting on her chest. Right shoulder was painful and hurting. Felt like she had a rapid heart beat- states heart beat was weak and thready. Took her albuterol and felt significantly better.   For last week to week and a half will either get a flutter in her chest or get a shock feeling in chest. Gets lightheaded then resolved. Happens randomly through day. Sometimes 2-3 x a day sometimes a dozen- daily. Mild shortness of breath with it- has had some allergies lately too.   Quit drinking soft drinks - down 30 lbs in last year- drinks more water. Has been trying to eat better but overall she has low appetite. Some depression- sees him in 2 days. States not drinking caffeine.   Anxiety has been better over the weekend-  but palpitations have been worse or just as bad A/P: In regards to her first episode-this sounds like asthma flareup.  It responded/resolved completely with albuterol  In regards to fluttering for the last week and a half-we will get CBC, CMP, TSH.  We will also get 72-hour Holter monitor. These may very well be noncardiac palpitations but we need to rule out Cardiac origin.  I asked her to journal when she has symptoms.  If these are PVCs-she has already cut down significantly on caffeine- no sodas at present.  Anxiety could contribute to her symptoms. No exertional symptoms.  Has had some Short-term sharp chest pains without other symptoms which sound low risk for cardia cause.   She has had an elevated prolactin level thought related to psychiatric medicines-we will update prolactin.  She does admit to poorly controlled depression and anxiety but she is following up closely with Dr. Daron Offer.  I did not inquire about  potential symptoms as intended from high prolactin  She is going to come back tomorrow morning for labs.  Future Appointments  Date Time Provider [place]  04/02/2018  8:45 AM [facility] LAB [facility]  04/03/2018  9:00 AM Eksir, Richard Miu, MD [facility] None  07/02/2018  9:30 AM Dohmeier, Asencion Partridge, MD [facility] None   Lab/Order associations: Chest pain, unspecified type - Plan: EKG 12-Lead  Palpitations - Plan: CBC, Comprehensive metabolic panel, TSH, Holter monitor - 72 hour  Prolactin increased ([place]) - Plan: Prolactin  Return precautions advised.  Garret Reddish, MD

## 2018-04-01 NOTE — Telephone Encounter (Signed)
Attempted to reach patient to triage.  Debra Nelson.

## 2018-04-02 ENCOUNTER — Encounter: Payer: Self-pay | Admitting: Family Medicine

## 2018-04-02 ENCOUNTER — Other Ambulatory Visit (INDEPENDENT_AMBULATORY_CARE_PROVIDER_SITE_OTHER): Payer: BLUE CROSS/BLUE SHIELD

## 2018-04-02 DIAGNOSIS — R002 Palpitations: Secondary | ICD-10-CM

## 2018-04-02 DIAGNOSIS — E229 Hyperfunction of pituitary gland, unspecified: Secondary | ICD-10-CM | POA: Diagnosis not present

## 2018-04-02 DIAGNOSIS — R7989 Other specified abnormal findings of blood chemistry: Secondary | ICD-10-CM

## 2018-04-02 LAB — COMPREHENSIVE METABOLIC PANEL
ALT: 14 U/L (ref 0–35)
AST: 12 U/L (ref 0–37)
Albumin: 4.7 g/dL (ref 3.5–5.2)
Alkaline Phosphatase: 64 U/L (ref 39–117)
BUN: 7 mg/dL (ref 6–23)
CO2: 27 mEq/L (ref 19–32)
Calcium: 10.2 mg/dL (ref 8.4–10.5)
Chloride: 104 mEq/L (ref 96–112)
Creatinine, Ser: 0.86 mg/dL (ref 0.40–1.20)
GFR: 78.03 mL/min (ref >60.00)
Glucose, Bld: 119 mg/dL — ABNORMAL HIGH (ref 70–99)
Potassium: 4.1 mEq/L (ref 3.5–5.1)
Sodium: 140 mEq/L (ref 135–145)
Total Bilirubin: 1 mg/dL (ref 0.2–1.2)
Total Protein: 7.3 g/dL (ref 6.0–8.3)

## 2018-04-02 LAB — CBC
HCT: 44.5 % (ref 36.0–46.0)
Hemoglobin: 15.3 g/dL — ABNORMAL HIGH (ref 12.0–15.0)
MCHC: 34.4 g/dL (ref 30.0–36.0)
MCV: 92.5 fl (ref 78.0–100.0)
Platelets: 327 10*3/uL (ref 150.0–400.0)
RBC: 4.81 Mil/uL (ref 3.87–5.11)
RDW: 13.4 % (ref 11.5–15.5)
WBC: 7.3 10*3/uL (ref 4.0–10.5)

## 2018-04-02 LAB — TSH: TSH: 1.95 u[IU]/mL (ref 0.35–4.50)

## 2018-04-02 NOTE — Progress Notes (Signed)
No cause for palpitations found. Prolactin pending Your CBC was largely normal (blood counts, infection fighting cells, platelets). Blood very slightly thick- we can repeat next time we see you.  Your CMET was normal (kidney, liver, and electrolytes, blood sugar).  Your thyroid was normal.

## 2018-04-03 ENCOUNTER — Encounter (HOSPITAL_COMMUNITY): Payer: Self-pay | Admitting: Psychiatry

## 2018-04-03 ENCOUNTER — Ambulatory Visit (HOSPITAL_COMMUNITY): Payer: BLUE CROSS/BLUE SHIELD | Admitting: Psychiatry

## 2018-04-03 VITALS — BP 127/87 | HR 108 | Ht 62.0 in | Wt 124.8 lb

## 2018-04-03 DIAGNOSIS — Z79899 Other long term (current) drug therapy: Secondary | ICD-10-CM

## 2018-04-03 DIAGNOSIS — F341 Dysthymic disorder: Secondary | ICD-10-CM | POA: Diagnosis not present

## 2018-04-03 DIAGNOSIS — Z818 Family history of other mental and behavioral disorders: Secondary | ICD-10-CM | POA: Diagnosis not present

## 2018-04-03 DIAGNOSIS — R4581 Low self-esteem: Secondary | ICD-10-CM

## 2018-04-03 DIAGNOSIS — F3181 Bipolar II disorder: Secondary | ICD-10-CM | POA: Diagnosis not present

## 2018-04-03 DIAGNOSIS — F4312 Post-traumatic stress disorder, chronic: Secondary | ICD-10-CM

## 2018-04-03 LAB — PROLACTIN: Prolactin: 11.4 ng/mL

## 2018-04-03 MED ORDER — LAMOTRIGINE 100 MG PO TABS: ORAL_TABLET | ORAL | 0 refills | Status: DC

## 2018-04-03 NOTE — Progress Notes (Signed)
BH MD/PA/NP OP Progress Note  04/03/2018 9:56 AM [name]  MRN:  [id number]  Chief Complaint: med management  HPI: Debra Nelson presents for follow-up.  She presents with her partner, who reports that he has been concerned about her depression and energy.  I spent time with the patient reflecting over the past 14 months since she has seen Probation officer, she has been tried on a myriad of medications, at least 8 different medications.  Her mood has been consistently unstable with perhaps several weeks of respite from depression or anxiety.  I spent time with her reframing some of her symptoms as ongoing, and connected to her personality and psyche.  She continues to struggle with significant poor self-esteem, feelings that she is a poor wife and a poor mother, and she is inadequate.  I spent time with her discussing what behaviors or interventions she is participating in in order to help her mood.  She admits that she does not do anything to improve her mood, and has stopped going to therapy because she did not think it was "working".  I educated patient on the interventions of DBT and referred her to [place] counseling which is in network with her insurance company.  I suggested we consider a few different interventions, including electroconvulsive therapy for treatment resistant bipolar depression.  We agreed to continue lithium at the current dose, given that the lithium level was 1.2 in March on the current dose of 300 mg nightly.  I educated her on the possible benefits of increasing Lamictal above the 200 mg dose, and we agreed to increase gradually as below.  I reiterated and educated her on the risks of Stevens-Johnson syndrome and instructions to go to the emergency department if this was to occur.  She denies any suicidal thoughts, and her husband is not concerned about any unsafe behaviors or suicidal thoughts at this time.  Educated patient that I am leaving this practice in 3 months, and  will support her in transitioning to Dr. Adele Schilder.  Spent time discussing that she requires a higher level of resources then would be appropriate for a private psychiatric practice, and I would like her to stay connected and [facility] for possible treatments of IOP, PHP, ECT, [place], and ongoing coordination of care with her primary care provider.  Visit Diagnosis:    ICD-10-CM   1. Chronic post-traumatic stress disorder (PTSD) F43.12   2. Bipolar II disorder (HCC) F31.81 lamoTRIgine (LAMICTAL) 100 MG tablet    Ambulatory referral to Psychiatry  3. Persistent depressive disorder F34.1     Past Psychiatric History: See intake H&P for full details. Reviewed, with no updates at this time.  Past Medical History:  Past Medical History:  Diagnosis Date  . Asthma   . Bipolar disorder with depression ([place])    and PTSD  . Chicken pox   . Deviated septum   . DJD (degenerative joint disease)    Severe of the right knee-Dr Creighton-ortho and Dr Gean Birchwood, PA-preferred pain managment  . GERD (gastroesophageal reflux disease)   . Seasonal allergic rhinitis     Past Surgical History:  Procedure Laterality Date  . knee surgery x5     08,11, 12, 13 x2- R knee  . right foot surgery     arthritis/bone spurus. plate placed.   . WISDOM TOOTH EXTRACTION      Family Psychiatric History: See intake H&P for full details. Reviewed, with no updates at this time.   Family History:  Family History  Problem Relation Age of Onset  . Mental illness Mother        does not speak to regularly so does not know full history  . Other Father        medical issues, divorce related  . Other Brother        ptsd- fully discharged medically disabled before age 45  . Cancer Paternal Grandfather     Social History:  Social History   Socioeconomic History  . Marital status: Married    Spouse name: Debra Nelson  . Number of children: 4  . Years of education: College  . Highest education level: Not on file   Occupational History    Employer: OTHER  Social Needs  . Financial resource strain: Not on file  . Food insecurity:    Worry: Not on file    Inability: Not on file  . Transportation needs:    Medical: Not on file    Non-medical: Not on file  Tobacco Use  . Smoking status: Never Smoker  . Smokeless tobacco: Never Used  Substance and Sexual Activity  . Alcohol use: No  . Drug use: No  . Sexual activity: Yes    Partners: Male    Birth control/protection: Pill  Lifestyle  . Physical activity:    Days per week: Not on file    Minutes per session: Not on file  . Stress: Not on file  Relationships  . Social connections:    Talks on phone: Not on file    Gets together: Not on file    Attends religious service: Not on file    Active member of club or organization: Not on file    Attends meetings of clubs or organizations: Not on file    Relationship status: Not on file  Other Topics Concern  . Not on file  Social History Narrative   Patient is married (Debra Nelson) and lives at home with her family   4 adults - 2 kids 50% of the time (1 son, 1 daughter). 2 older children (one age 40 from rape, then 2 year old- does not get to see- related to bipolar) Moved in with in laws to help them      Patient has 4 children (3 of her own and 1 step son).    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   Patient is right-handed.    Allergies:  Allergies  Allergen Reactions  . Other Other (See Comments)  . Phenergan [Promethazine Hcl]     IV causes muscle tics, may take oral, or give benadryl prior to IV  . Promethazine Other (See Comments)    She cannot take IV phenergan unless this it is given with Benadryl.  . Promethazine Hcl Other (See Comments)    IV causes muscle tics, may take oral, or give benadryl prior to IV  . Topamax [Topiramate] Nausea And Vomiting    confusion  . Klonopin  [Clonazepam] Nausea Only    Metabolic Disorder Labs: Lab Results  Component Value Date   HGBA1C 4.9 01/30/2018   Lab Results  Component Value Date   PROLACTIN 11.4 04/02/2018   PROLACTIN 129.1 (H) 01/30/2018   Lab Results  Component Value Date   CHOL 170 01/30/2018   TRIG 91 01/30/2018   HDL 57 01/30/2018   Saranap 95 01/30/2018  Lab Results  Component Value Date   TSH 1.95 04/02/2018   TSH 2.380 01/30/2018    Therapeutic Level Labs: Lab Results  Component Value Date   LITHIUM 1.2 01/30/2018   LITHIUM 0.6 04/12/2017   No results found for: VALPROATE No components found for:  CBMZ  Current Medications: Current Outpatient Medications  Medication Sig Dispense Refill  . albuterol (2.5 MG/3ML) 0.083% NEBU 3 mL, albuterol (5 MG/ML) 0.5% NEBU 0.5 mL Inhale into the lungs daily as needed.    Marland Kitchen albuterol (PROAIR HFA) 108 (90 Base) MCG/ACT inhaler Inhale 2 puffs into the lungs every 6 (six) hours as needed for wheezing or shortness of breath.    . Armodafinil 150 MG tablet Take 1 tablet (150 mg total) by mouth daily. 30 tablet 5  . Diclofenac Sodium (PENNSAID) 2 % SOLN Place 1 application onto the skin 2 (two) times daily. 112 g 2  . EPIPEN 2-PAK 0.3 MG/0.3ML SOAJ injection as needed.    . lamoTRIgine (LAMICTAL) 100 MG tablet Take 1.5 tablets (150 mg total) by mouth 2 (two) times daily for 10 days, THEN 2 tablets (200 mg total) 2 (two) times daily. 180 tablet 0  . lithium carbonate (LITHOBID) 300 MG CR tablet Take 1 tablet (300 mg total) by mouth daily. 90 tablet 1  . LORazepam (ATIVAN) 1 MG tablet Take 1 tablet (1 mg total) by mouth 2 (two) times daily. 60 tablet 2  . montelukast (SINGULAIR) 10 MG tablet Take 10 mg by mouth at bedtime.    . Multiple Vitamins-Minerals (HAIR SKIN AND NAILS FORMULA PO) Take 2 tablets by mouth daily.    . promethazine (PHENERGAN) 25 MG tablet Take 1 tablet (25 mg total) by mouth every 6 (six) hours as needed for nausea or vomiting. 30 tablet 3  .  Sodium Oxybate 500 MG/ML SOLN Please take 3 g at bedtime. Repeat 3 g 3-4 hours later/ total dose 6 g nightly 540 mL 0  . SUMAtriptan (IMITREX) 100 MG tablet Take 0.5-1 tablets (50-100 mg total) by mouth every 2 (two) hours as needed for migraine. May repeat in 2 hours if headache persists or recurs. 10 tablet 2  . TURMERIC PO Take 1 tablet by mouth daily.     Current Facility-Administered Medications  Medication Dose Route Frequency Provider Last Rate Last Dose  . diclofenac sodium (VOLTAREN) 1 % transdermal gel 2 g  2 g Topical QID PRN Aundra Dubin, PA-C         Musculoskeletal: Strength & Muscle Tone: within normal limits Gait & Station: normal Patient leans: N/A  Psychiatric Specialty Exam: ROS  Blood pressure 127/87, pulse (!) 108, height '5\' 2"'$  (1.575 m), weight 124 lb 12.8 oz (56.6 kg), SpO2 97 %.Body mass index is 22.83 kg/m.  General Appearance: Casual and Fairly Groomed  Eye Contact:  Fair  Speech:  Clear and Coherent and Normal Rate  Volume:  Normal  Mood:  Depressed and Dysphoric  Affect:  Congruent and Depressed  Thought Process:  Coherent, Goal Directed and Descriptions of Associations: Intact  Orientation:  Full (Time, Place, and Person)  Thought Content: Logical   Suicidal Thoughts:  No  Homicidal Thoughts:  No  Memory:  Immediate;   Good  Judgement:  Fair  Insight:  Shallow  Psychomotor Activity:  Normal  Concentration:  Concentration: Fair  Recall:  [place] of Knowledge: Good  Language: Good  Akathisia:  Negative  Handed:  Right  AIMS (if indicated): not done  Assets:  Communication Skills Financial Resources/Insurance Housing Social Support  ADL's:  Intact  Cognition: WNL  Sleep:  Fair   Screenings: PHQ2-9     [facility] Visit from 04/07/2015 in [facility]  PHQ-2 Total Score  4  PHQ-9 Total Score  14       Assessment and Plan:  AUDRYNA WENDT is a 37 year old female with PTSD, bipolar 2, narcolepsy, and cluster B  personality who presents today for psychiatric follow-up medication management.  She presents with her husband, as she has continued to struggle with ongoing depressive symptoms.  I educated patient about the diminishing returns with ongoing repeat medication trials particularly given the number of medications she has been tried on.  I educated her on my recommendation to consider ECT, and participate more fully in therapy.  I referred her to [facility] counseling for DBT individual and group treatment.  She does not present with any suicidal thinking, and has no intention to harm herself.  This is corroborated by her husband.  A disclosed that I am leaving practice at the end of August, and patient understands there will be a transition of her care to Dr. Adele Schilder, unless she chooses to find alternative psychiatric care at that time.  We agreed to proceed with a titration of Lamictal above typical therapeutic dose, given that Lamictal has generally provided her benefit in the past.  1. Chronic post-traumatic stress disorder (PTSD)   2. Bipolar II disorder ([place])   3. Persistent depressive disorder     Status of current problems: unchanged  Labs Ordered: Orders Placed This Encounter  Procedures  . Ambulatory referral to Psychiatry    Referral Priority:   Routine    Referral Type:   Psychiatric    Referral Reason:   Specialty Services Required    Referred to Provider:   Gonzella Lex, MD    Requested Specialty:   Psychiatry    Number of Visits Requested:   1    Labs Reviewed: Lab Results  Component Value Date   LITHIUM 1.2 01/30/2018   NA 140 04/02/2018   BUN 7 04/02/2018   CREATININE 0.86 04/02/2018   TSH 1.95 04/02/2018   WBC 7.3 04/02/2018     Collateral Obtained/Records Reviewed: Husband is available in present and able to corroborate that her mood has been quite depressed for many months, and he is also able to corroborate that she has not had any long-standing period of stability  for many years  Plan:  Increase Lamictal to 150 mg twice a day for 10 days, then increase to 200 mg twice a day Continue lithium 300 mg nightly Referral for ECT treatment Patient has failed trials with Seroquel, Geodon, Latuda, cariprazine, Rexulti Referral for DBT group and individual treatment Educated patient about the importance of behavioral activation interventions and implored her to set some appropriate goals for herself on a daily basis  I spent 25 minutes with the patient in direct face-to-face clinical care.  Greater than 50% of this time was spent in counseling and coordination of care with the patient.    Aundra Dubin, MD 04/03/2018, 9:56 AM

## 2018-04-03 NOTE — Patient Instructions (Signed)
Call [facility] for DBT treatment referral  [phone]

## 2018-04-03 NOTE — Progress Notes (Signed)
Excellent thank you

## 2018-04-03 NOTE — Progress Notes (Signed)
Great news prolactin back in normal range.  I am forwarding to Dr. Daron Offer as well

## 2018-04-04 LAB — HM PAP SMEAR

## 2018-04-30 ENCOUNTER — Other Ambulatory Visit: Payer: Self-pay | Admitting: Neurology

## 2018-06-03 ENCOUNTER — Ambulatory Visit (HOSPITAL_COMMUNITY): Payer: Self-pay | Admitting: Psychiatry

## 2018-06-19 ENCOUNTER — Ambulatory Visit (HOSPITAL_COMMUNITY): Payer: BLUE CROSS/BLUE SHIELD | Admitting: Psychiatry

## 2018-06-20 ENCOUNTER — Ambulatory Visit (HOSPITAL_COMMUNITY): Payer: Self-pay | Admitting: Psychiatry

## 2018-07-02 ENCOUNTER — Encounter: Payer: Self-pay | Admitting: Neurology

## 2018-07-02 ENCOUNTER — Telehealth (HOSPITAL_COMMUNITY): Payer: Self-pay

## 2018-07-02 ENCOUNTER — Ambulatory Visit: Payer: BLUE CROSS/BLUE SHIELD | Admitting: Neurology

## 2018-07-02 ENCOUNTER — Encounter (HOSPITAL_COMMUNITY): Payer: Self-pay

## 2018-07-02 VITALS — BP 118/82 | HR 89 | Ht 61.5 in | Wt 130.0 lb

## 2018-07-02 DIAGNOSIS — G4753 Recurrent isolated sleep paralysis: Secondary | ICD-10-CM | POA: Diagnosis not present

## 2018-07-02 DIAGNOSIS — Z79899 Other long term (current) drug therapy: Secondary | ICD-10-CM

## 2018-07-02 DIAGNOSIS — G47411 Narcolepsy with cataplexy: Secondary | ICD-10-CM

## 2018-07-02 MED ORDER — ARMODAFINIL 150 MG PO TABS: 150.0000 mg | ORAL_TABLET | Freq: Every day | ORAL | 5 refills | Status: DC

## 2018-07-02 MED ORDER — SODIUM OXYBATE 500 MG/ML PO SOLN: ORAL | 0 refills | Status: DC

## 2018-07-02 NOTE — Progress Notes (Signed)
[facility]   Provider:  Larey Seat, M D  Referring Provider: Marin Olp, MD Primary Care Physician:  Marin Olp, MD  Chief Complaint  Patient presents with  . Follow-up    pt with husband, rm 10. pt states things are going well.     HPI: I have the pleasure of seeing [name] in the presence of her husband here today on 02 July 2018.  She is a longtime established narcolepsy patient in our practice who is taking Xyrem.  She has overall achieved very good control of hypersomnia but we had to find the right dose for her liquid nightly trigger sleepwalking episodes nor allow her to resume her nightmarish dreams.  For many months she did well at 3 g twice at night, however the dreams have now returned in the early morning hours following the second dose by probably 2 hours also.  We could increase the second Xyrem dose to 3.5 g, and may be divided the 2 doses by a longer interval.  Of 2-1/2 hours.  This may cover the second half of the night better, but it puts her at risk of daytime sleepiness if the Xyrem is still in her system.  Usually she works at 7.30 AM . This allows the intake time to be 1.30 AM, the first dose around 22.00 hours.     Debra Nelson is a 37 y.o. female , seen here as a revisit from Dr. Yong Channel for treatment of hypersomnia, HLA positive , diagnosed with Narcolepsy. Debra Nelson has been titrated to Xyrem which has given her final several years of good control of hypersomnia.  Originally I had titrated her to the maximum dose of 4.5 g twice at night but she began to sleep walk and had other parasomnia activity, the dose was reduced to 3 g twice at night under which she did well for the last 18 months.  She now reports that she is not sleeping nearly as deep, she has breakthrough dream activity and her husband confirms that it is easier to wake her than it used to be.  Since she has room to further increase the dose we can go from here to at  least 3.5 g twice at night and see if further adjustments are necessary.  I would like this process to go slow as not to reintroduce sleepwalking. The fatigue severity score was endorsed at 42 points and the Epworth sleepiness score at 8 points.   This is still a great effect in comparison to the untreated Epworth sleepiness score.   Today is 07/02/2017, And as the pleasure of seeing Debra Nelson in the presence of her young daughter. The patient endorses only 3 points on her Epworth Sleepiness Scale today, 22% on her fatigue score and is doing very well in terms of control of sleepiness, cataplexy and narcolepsy. She has lost weight. She finally got off Soda. She is not employed right now.  Her husband was retired from the police due to DDD.    08-31-2016  Debra Nelson is a 36 y.o. female , seen here as a revisit from Dr. Yong Channel for treatment of hypersomnia, HLA positive , diagnosed with Narcolepsy and cataplexy . The patient developed sleepwalking episodes on Xyrem, but she could not find control of her narcolepsy and cataplexy without the medication. For this reason she has restarted apnea monitoring her closely. Her fatigue severity scale today is 47, Epworth sleepiness score is 6 points on  Xyrem with armodafinil . The [employer] has now been again covered by her insurance not longer on Tofranil-.Mood overall is good, less variability, not euphoric.Not suicidal - no intent , no plan.   [name] reports morning headaches frequently now, my concern is that Xyrem may induce hypoventilation that she could be a CO2 retainer. 3 gram of Xyrem has controlled sleepiness and not allowed sleepwaling, but still has vivid dreams.   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  She has started to sleep walk almost nightly and severely. The discussion as to  discontinue the Xyrem and rely modafinil only- but that would not affect her cataplexy. Currently she has not taken Xyrem for 12 days but is not sleep walking, but her cataplexy is poorly controlled. I would like very much for her to go back on Xyrem but it has to be in a safe environment for she can be watched not vice versa. I will give her Tofranil today to control the cataplexy as long as she is not able to go back on Xyrem and I would refill modafinil.-Nuvigil. This helped a lot in day time. ( Due to parasomnia activity Xyrem is currently on hold. Her husband needs to be back on his feet before we can stop the medication again I also will give her Tofranil to help control cataplexy be continued Nuvigil in daytime. They can change the dosing of Xyrem to the first dose being given at 4.5 g at night and the second dose to be 2 or even 3 g less. Sometimes this will help to prevent parasomnias in the morning hours. I would also like for her to have a motion detector at the door of the bedroom so that she may wake up or others are alerted to her activity. I would like to recommend her to lock all doors and especially keeps a car keys in a pocket or a drawer but they are not visible)  Chief complaint according to patient :" Recurrent hypersomnia "after the patient's medications were no longer approved by insurance.  She was changed from Nuvigil to Provigil and has not responded well.The patient is an HLA positive narcoleptic with co-morbidities of  Bipolar disorder. This limits the use of stimulants in her treatment.  She was changed from [place] to [place] by Psychiatry, and her anxiety level was lower than it had been in a long time.The patient felt that her psychiatric condition has been very well controlled with the new LATUDA  Medication- her anxiety level decreased she was much less irritable. Shortly after that  the medication Nuvigil was not longer covered and she is now sleepier and less attentive.  Sleep  studies : see media. The patient has been on Xyrem, and lost weight in the process.   Interval 10-19-15, Mrs. Tolin reports that she had a good, but very busy Thanksgiving and that she has been the main caretaker of her husband , who is recovering from her surgery. Overall she has lost more weight since being on Xyrem. Her insurance stopped covering the Nuvigil as I had reported at her last visit she was changed from Nuvigil to Provigil but had not responded as well and especially not to the generic form. She has noted that it takes her longer to fall asleep on Xyrem.  She needs to get back on Nuvigil for  daytime sleepiness. Propranolol seems to work less for headache prophylaxis. The recurrence of headaches can also be related to the discontinuation of Nuvigil.    Review of Systems: Out of a complete 14 system review, the patient complains of only the following symptoms, and all other reviewed systems are negative. Fatigue severity score  Again 28 from 41 (Xyrem)  from 53, before xyrem  Epworth Sleepiness score endorsed at 07-02-2018  2/ 24 , dreams are coming back before she wakes up. Nightmarish-  She is not sleep walking - she was at 4.5 gram at the time. She dropped the dose to first 3.0 gram and the second to 3.0 - will now go to 3.5 at sec dose.    depression score PHQ 9 :  How likely are you to doze in the following situations: 0 = not likely, 1 = slight chance, 2 = moderate chance, 3 = high chance  Sitting and Reading? 1 Watching Television?1 Sitting inactive in a public place (theater or meeting)?0 As a passenger in a car for an hour without a break? 0 Lying down in the afternoon when circumstances permit? 2 Sitting and talking to someone?0 Sitting quietly after lunch without alcohol? 0 In a car, while stopped for a few minutes in traffic?0   Total = 4 on Xyrem , 07-02-2018    Recurrent dreams, no cataplexy.  Snoring  Memory loss. multitasking difficulties.   Office Visit from  04/07/2015 in [facility]  PHQ-9 Total Score  14        Social History   Socioeconomic History  . Marital status: Married    Spouse name: [name]  . Number of children: 4  . Years of education: College  . Highest education level: Not on file  Occupational History    Employer: OTHER  Social Needs  . Financial resource strain: Not on file  . Food insecurity:    Worry: Not on file    Inability: Not on file  . Transportation needs:    Medical: Not on file    Non-medical: Not on file  Tobacco Use  . Smoking status: Never Smoker  . Smokeless tobacco: Never Used  Substance and Sexual Activity  . Alcohol use: No  . Drug use: No  . Sexual activity: Yes    Partners: Male    Birth control/protection: Pill  Lifestyle  . Physical activity:    Days per week: Not on file    Minutes per session: Not on file  . Stress: Not on file  Relationships  . Social connections:    Talks on phone: Not on file    Gets together: Not on file    Attends religious service: Not on file    Active member of club or organization: Not on file    Attends meetings of clubs or organizations: Not on file    Relationship status: Not on file  . Intimate partner violence:    Fear of current or ex partner: Not on file    Emotionally abused: Not on file    Physically abused: Not on file    Forced sexual activity: Not on file  Other Topics Concern  . Not on file  Social History Narrative   Patient is married ([name]) and lives at home with her family   4 adults - 2 kids 50% of the time (1 son, 1 daughter). 2 older children (one age 33 from rape, then 61 year old- does not get to see- related to bipolar)  Moved in with in laws to help them      Patient has 4 children (3 of her own and 1 step son).    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   Patient is right-handed.    Family History   Problem Relation Age of Onset  . Mental illness Mother        does not speak to regularly so does not know full history  . Other Father        medical issues, divorce related  . Other Brother        ptsd- fully discharged medically disabled before age 61  . Cancer Paternal Grandfather     Past Medical History:  Diagnosis Date  . Asthma   . Bipolar disorder with depression ([place])    and PTSD  . Chicken pox   . Deviated septum   . DJD (degenerative joint disease)    Severe of the right knee-Dr Creighton-ortho and Dr Gean Birchwood, PA-preferred pain managment  . GERD (gastroesophageal reflux disease)   . Seasonal allergic rhinitis     Past Surgical History:  Procedure Laterality Date  . knee surgery x5     08,11, 12, 13 x2- R knee  . right foot surgery     arthritis/bone spurus. plate placed.   . WISDOM TOOTH EXTRACTION      Current Outpatient Medications  Medication Sig Dispense Refill  . albuterol (2.5 MG/3ML) 0.083% NEBU 3 mL, albuterol (5 MG/ML) 0.5% NEBU 0.5 mL Inhale into the lungs daily as needed.    Marland Kitchen albuterol (PROAIR HFA) 108 (90 Base) MCG/ACT inhaler Inhale 2 puffs into the lungs every 6 (six) hours as needed for wheezing or shortness of breath.    . Armodafinil 150 MG tablet Take 1 tablet (150 mg total) by mouth daily. 30 tablet 5  . EPIPEN 2-PAK 0.3 MG/0.3ML SOAJ injection as needed.    . lamoTRIgine (LAMICTAL) 100 MG tablet 100 mg 2 (two) times daily.    Marland Kitchen lithium carbonate (LITHOBID) 300 MG CR tablet Take 1 tablet (300 mg total) by mouth daily. 90 tablet 1  . LORazepam (ATIVAN) 1 MG tablet Take 1 tablet (1 mg total) by mouth 2 (two) times daily. 60 tablet 2  . montelukast (SINGULAIR) 10 MG tablet Take 10 mg by mouth at bedtime.    . Multiple Vitamins-Minerals (HAIR SKIN AND NAILS FORMULA PO) Take 1 tablet by mouth daily.     . Multiple Vitamins-Minerals (MULTIVITAMIN WITH MINERALS) tablet Take 1 tablet by mouth daily.    . promethazine (PHENERGAN) 25 MG tablet  TAKE ONE TABLET BY MOUTH EVERY 6 HOURS AS NEEDED FOR NAUSEA AND VOMITING 30 tablet 8  . Sodium Oxybate 500 MG/ML SOLN Please take 3 g at bedtime. Repeat 3 g 3-4 hours later/ total dose 6 g nightly 540 mL 0  . SUMAtriptan (IMITREX) 100 MG tablet Take 0.5-1 tablets (50-100 mg total) by mouth every 2 (two) hours as needed for migraine. May repeat in 2 hours if headache persists or recurs. 10 tablet 2   Current Facility-Administered Medications  Medication Dose Route Frequency Provider Last Rate Last Dose  . diclofenac sodium (VOLTAREN) 1 % transdermal gel 2 g  2 g Topical QID PRN Dwana Melena L, PA-C        Allergies as of 07/02/2018 - Review Complete 07/02/2018  Allergen  Reaction Noted  . Other Other (See Comments) 03/07/2013  . Phenergan [promethazine hcl]  12/26/2011  . Promethazine Other (See Comments) 03/07/2013  . Promethazine hcl Other (See Comments) 12/26/2011  . Topamax [topiramate] Nausea And Vomiting 12/19/2013  . Klonopin [clonazepam] Nausea Only 12/19/2013   Sodium 135 - 145 mEq/L 138   Potassium 3.5 - 5.1 mEq/L 4.1   Chloride 96 - 112 mEq/L 103   CO2 19 - 32 mEq/L 29   Glucose, Bld 70 - 99 mg/dL 102 Abnormally high    BUN 6 - 23 mg/dL 8   Creatinine, Ser 0.40 - 1.20 mg/dL 0.85   Total Bilirubin 0.2 - 1.2 mg/dL 0.3   Alkaline Phosphatase 39 - 117 U/L 71   AST 0 - 37 U/L 13   ALT 0 - 35 U/L 14   Total Protein 6.0 - 8.3 g/dL 7.4   Albumin 3.5 - 5.2 g/dL 4.4   Calcium 8.4 - 10.5 mg/dL 9.8   GFR >60.00 mL/min 79.21     Vitals: BP 118/82   Pulse 89   Ht 5' 1.5" (1.562 m)   Wt 130 lb (59 kg)   BMI 24.17 kg/m  Last Weight:  Wt Readings from Last 1 Encounters:  07/02/18 130 lb (59 kg)   OFB:PZWC mass index is 24.17 kg/m.     Last Height:   Ht Readings from Last 1 Encounters:  07/02/18 5' 1.5" (1.562 m)    Physical exam:  General: The patient is awake, alert and appears relaxed. The patient is well groomed. Head: Normocephalic, atraumatic. Neck  - paraspinal  tenderness.  Mallampati 2- crowded lower jaw,  neck circumference:14 inches  . Nasal airflow unrestricted , TMJ click not evident . Retrognathia is not seen.  Cardiovascular:  Regular rate and rhythm. Respiratory:  clear  Skin:  Without evidence of edema, or rash Trunk: BMI is elevated . The patient's posture is erect.    Neurologic exam : The patient is awake and alert, oriented to place and time.    Memory subjective described as intact.   Speech is not pressured, is fluent, without dysarthria, dysphonia. No tremor in her voice.  Mood and affect are appropriate, she is conversant and seems happy. Attention is normal. She reports having been depressed.   Taste and smell are reportedly intact.  Pupils are equal and briskly reactive to light. Extraocular movements  in vertical and horizontal planes intact and without nystagmus. Visual fields by finger perimetry are intact. Facial motor strength is symmetric and tongue and uvula move midline. Shoulder shrug was symmetrical.  Motor: intact strength, no focal weakness, symmetric muscle bulk and tone. Sensory: Intact to primary modalities. Normal gait and balance.    Assessment:  After physical and neurologic examination, review of laboratory studies,  Personal review of imaging studies, reports of other /same  Imaging studies ,   Results of polysomnography/ neurophysiology testing and pre-existing records as far as provided in visit.,  my assessment is:     The patient was advised of the nature of the diagnosed Narcolepsy  , the treatment with XYREM and risks for general a health and wellness arising from not treating the condition. The patient has also migraines, but infrequently.  She will look into magnetic stimulation therapy for her depression, and was told she may need ECT- which would cause more memory loss/ lasting impact on her mental health. Her psychiatrist is concerned about hypomania - and modafinil can push her off.     I  spent more than 25 minutes of face to face time with the patient. Greater than 50% of time was spent in counseling and coordination of care. We have discussed the diagnosis and differential and I answered the patient's questions.    Narcolepsy confirmed by HLA -  XYREM and NUVIGIL controlled her hypersomnia and Latuda controlled her anxiety.   XYREM has alleviated her cataplexy  .  No need for tofranil.  Increase to 3.0 gram first dose and 3.5 g second dose nightly.   Parasomnia/ sleep walking activity on XYREM has ceased after the dose was reduced form 4 to 3 gram twice at night. Now Xyrem is less effective. Will increase to 3.5 gram in the second dose. LFT, CMT. lithium test.  CBC diff TSH        Asencion Partridge Whitni Pasquini MD  07/02/2018   CC: Marin Olp, Md [address] [place], [place] [address]

## 2018-07-02 NOTE — Telephone Encounter (Signed)
Received a refill request from [facility] for Lithium ER 300 mg tabs. Next appointment is scheduled for 07-05-18. Please advise

## 2018-07-02 NOTE — Patient Instructions (Signed)
As discussed, Xyrem has to be taken with very mindful caution: Taking Xyrem correctly is key. This means, take it only when you are fully ready to fall asleep, while in bed and refrain from doing any other activities, even brushing  your teeth after taking your first dose. The second dose will be about 2-1/2-4 hours after his first dose. You can go to the bathroom before your 2nd dose. Take your first dose, when actually IN BED, ready to sleep. No sitting up in bed, NO reading, NO using the cell phone or computer, NO getting up to use the bathroom. Take care of everything BEFORE sleep time. Try NOT to skip the second dose as the Xyrem is not going to stay in your system long enough with only one dose. Do not drink alcohol with Xyrem. If you do drink Alcohol, you cannot take your Xyrem doses that night.   

## 2018-07-02 NOTE — Telephone Encounter (Signed)
Yes okay to send 90 days

## 2018-07-03 ENCOUNTER — Encounter (HOSPITAL_COMMUNITY): Payer: Self-pay

## 2018-07-03 ENCOUNTER — Telehealth: Payer: Self-pay | Admitting: Neurology

## 2018-07-03 ENCOUNTER — Other Ambulatory Visit (HOSPITAL_COMMUNITY): Payer: Self-pay | Admitting: Psychiatry

## 2018-07-03 DIAGNOSIS — F3181 Bipolar II disorder: Secondary | ICD-10-CM

## 2018-07-03 LAB — CBC WITH DIFFERENTIAL/PLATELET
Basophils Absolute: 0 10*3/uL (ref 0.0–0.2)
Basos: 0 %
EOS (ABSOLUTE): 0.1 10*3/uL (ref 0.0–0.4)
Eos: 1 %
Hematocrit: 44.7 % (ref 34.0–46.6)
Hemoglobin: 14.7 g/dL (ref 11.1–15.9)
Immature Grans (Abs): 0 10*3/uL (ref 0.0–0.1)
Immature Granulocytes: 0 %
Lymphocytes Absolute: 2.1 10*3/uL (ref 0.7–3.1)
Lymphs: 28 %
MCH: 32 pg (ref 26.6–33.0)
MCHC: 32.9 g/dL (ref 31.5–35.7)
MCV: 97 fL (ref 79–97)
Monocytes Absolute: 0.4 10*3/uL (ref 0.1–0.9)
Monocytes: 5 %
Neutrophils Absolute: 4.8 10*3/uL (ref 1.4–7.0)
Neutrophils: 66 %
Platelets: 369 10*3/uL (ref 150–450)
RBC: 4.59 x10E6/uL (ref 3.77–5.28)
RDW: 12.6 % (ref 12.3–15.4)
WBC: 7.3 10*3/uL (ref 3.4–10.8)

## 2018-07-03 LAB — COMPREHENSIVE METABOLIC PANEL
ALT: 12 IU/L (ref 0–32)
AST: 13 IU/L (ref 0–40)
Albumin/Globulin Ratio: 2 (ref 1.2–2.2)
Albumin: 4.7 g/dL (ref 3.5–5.5)
Alkaline Phosphatase: 89 IU/L (ref 39–117)
BUN/Creatinine Ratio: 9 (ref 9–23)
BUN: 8 mg/dL (ref 6–20)
Bilirubin Total: 0.7 mg/dL (ref 0.0–1.2)
CO2: 27 mmol/L (ref 20–29)
Calcium: 10.7 mg/dL — ABNORMAL HIGH (ref 8.7–10.2)
Chloride: 102 mmol/L (ref 96–106)
Creatinine, Ser: 0.85 mg/dL (ref 0.57–1.00)
GFR calc Af Amer: 100 mL/min/{1.73_m2} (ref >59)
GFR calc non Af Amer: 87 mL/min/{1.73_m2} (ref >59)
Globulin, Total: 2.3 g/dL (ref 1.5–4.5)
Glucose: 89 mg/dL (ref 65–99)
Potassium: 4.4 mmol/L (ref 3.5–5.2)
Sodium: 142 mmol/L (ref 134–144)
Total Protein: 7 g/dL (ref 6.0–8.5)

## 2018-07-03 LAB — TSH+FREE T4
Free T4: 1.39 ng/dL (ref 0.82–1.77)
TSH: 1.91 u[IU]/mL (ref 0.450–4.500)

## 2018-07-03 LAB — LITHIUM LEVEL: Lithium Lvl: 0.4 mmol/L — ABNORMAL LOW (ref 0.6–1.2)

## 2018-07-03 MED ORDER — LITHIUM CARBONATE ER 300 MG PO TBCR: 300.0000 mg | EXTENDED_RELEASE_TABLET | Freq: Every day | ORAL | 1 refills | Status: DC

## 2018-07-03 NOTE — Telephone Encounter (Signed)
-----   Message from Larey Seat, MD sent at 07/03/2018  1:13 PM EDT ----- Please call with low lithium level- her thyroid hormones are normal ( TSH and T4)

## 2018-07-03 NOTE — Telephone Encounter (Signed)
Called the pt no answer. LVM informing her that her litium level was low and what the number was. Informed her that the thyroid levels were normal. Instructed to make her MD aware of the low lithium level and advised the pt to contact me if she has questions.

## 2018-07-04 ENCOUNTER — Encounter (HOSPITAL_COMMUNITY): Payer: Self-pay | Admitting: Psychiatry

## 2018-07-04 ENCOUNTER — Encounter (HOSPITAL_COMMUNITY): Payer: Self-pay

## 2018-07-04 ENCOUNTER — Ambulatory Visit (INDEPENDENT_AMBULATORY_CARE_PROVIDER_SITE_OTHER): Payer: BLUE CROSS/BLUE SHIELD | Admitting: Psychiatry

## 2018-07-04 DIAGNOSIS — Z818 Family history of other mental and behavioral disorders: Secondary | ICD-10-CM

## 2018-07-04 DIAGNOSIS — F3181 Bipolar II disorder: Secondary | ICD-10-CM

## 2018-07-04 DIAGNOSIS — F3160 Bipolar disorder, current episode mixed, unspecified: Secondary | ICD-10-CM | POA: Diagnosis not present

## 2018-07-04 MED ORDER — LORAZEPAM 1 MG PO TABS: 1.0000 mg | ORAL_TABLET | Freq: Two times a day (BID) | ORAL | 2 refills | Status: DC

## 2018-07-04 MED ORDER — FLUOXETINE HCL 10 MG PO CAPS: 10.0000 mg | ORAL_CAPSULE | ORAL | 0 refills | Status: DC

## 2018-07-04 MED ORDER — LITHIUM CARBONATE ER 450 MG PO TBCR: 450.0000 mg | EXTENDED_RELEASE_TABLET | Freq: Every day | ORAL | 0 refills | Status: DC

## 2018-07-04 NOTE — Progress Notes (Signed)
BH MD/PA/NP OP Progress Note  07/04/2018 11:16 AM Debra Nelson  MRN:  [id number]  Chief Complaint: med management  HPI: Debra Nelson presents for medication management follow-up.  She shares that she has recently started seeing  Debra Nelson for therapy and this has been quite helpful.  She continues to struggle with breakthrough depressive symptoms and we agreed to increase lithium to 450 mg given that the lithium level was low.  We weighed the risks and benefits of this including the potential return of thyroid disease, but she feels that the lithium provides her substantial benefit.  We agreed to also consider transcranial magnetic stimulation, but I encouraged her to read more about this given that she has a complex presentation with bipolar spectrum contributing to her depression.  We also agreed to add a low-dose of fluoxetine given that she struggles with comorbid PTSD and SSRI tends to be quite effective for PTSD symptoms.  Reviewed the risks of SSRI eliciting worsening mood and mania, and cautioned her that we would do best to maintain a low-dose.  She denies any acute suicidality and her husband is able to corroborate that she has been working hard and seems to be doing somewhat better with coping strategies.  She uses Ativan generally about once a day.  No substance abuse or other unsafe behaviors.  She will follow-up in office with Dr. Adele Nelson in about 4-6 weeks for a transfer of care.  Visit Diagnosis:    ICD-10-CM   1. Bipolar II disorder (HCC) F31.81 lithium carbonate (ESKALITH) 450 MG CR tablet    FLUoxetine (PROZAC) 10 MG capsule  2. Bipolar I disorder, most recent episode mixed ([place]) F31.60 LORazepam (ATIVAN) 1 MG tablet    Past Psychiatric History: See intake H&P for full details. Reviewed, with no updates at this time.  Past Medical History:  Past Medical History:  Diagnosis Date  . Asthma   . Bipolar disorder with depression ([place])    and PTSD  . Chicken pox   .  Deviated septum   . DJD (degenerative joint disease)    Severe of the right knee-Dr Creighton-ortho and Dr Gean Birchwood, PA-preferred pain managment  . GERD (gastroesophageal reflux disease)   . Seasonal allergic rhinitis     Past Surgical History:  Procedure Laterality Date  . knee surgery x5     08,11, 12, 13 x2- R knee  . right foot surgery     arthritis/bone spurus. plate placed.   . WISDOM TOOTH EXTRACTION      Family Psychiatric History: See intake H&P for full details. Reviewed, with no updates at this time.   Family History:  Family History  Problem Relation Age of Onset  . Mental illness Mother        does not speak to regularly so does not know full history  . Other Father        medical issues, divorce related  . Other Brother        ptsd- fully discharged medically disabled before age 2  . Cancer Paternal Grandfather     Social History:  Social History   Socioeconomic History  . Marital status: Married    Spouse name: Debra Nelson  . Number of children: 4  . Years of education: College  . Highest education level: Not on file  Occupational History    Employer: OTHER  Social Needs  . Financial resource strain: Not on file  . Food insecurity:    Worry: Not on file  Inability: Not on file  . Transportation needs:    Medical: Not on file    Non-medical: Not on file  Tobacco Use  . Smoking status: Never Smoker  . Smokeless tobacco: Never Used  Substance and Sexual Activity  . Alcohol use: No  . Drug use: No  . Sexual activity: Yes    Partners: Male    Birth control/protection: Pill  Lifestyle  . Physical activity:    Days per week: Not on file    Minutes per session: Not on file  . Stress: Not on file  Relationships  . Social connections:    Talks on phone: Not on file    Gets together: Not on file    Attends religious service: Not on file    Active member of club or organization: Not on file    Attends meetings of clubs or organizations: Not  on file    Relationship status: Not on file  Other Topics Concern  . Not on file  Social History Narrative   Patient is married (Debra Nelson) and lives at home with her family   4 adults - 2 kids 50% of the time (1 son, 1 daughter). 2 older children (one age 63 from rape, then 48 year old- does not get to see- related to bipolar) Moved in with in laws to help them      Patient has 4 children (3 of her own and 1 step son).    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   Patient is right-handed.    Allergies:  Allergies  Allergen Reactions  . Other Other (See Comments)  . Phenergan [Promethazine Hcl]     IV causes muscle tics, may take oral, or give benadryl prior to IV  . Promethazine Other (See Comments)    She cannot take IV phenergan unless this it is given with Benadryl.  . Promethazine Hcl Other (See Comments)    IV causes muscle tics, may take oral, or give benadryl prior to IV  . Topamax [Topiramate] Nausea And Vomiting    confusion  . Klonopin [Clonazepam] Nausea Only    Metabolic Disorder Labs: Lab Results  Component Value Date   HGBA1C 4.9 01/30/2018   Lab Results  Component Value Date   PROLACTIN 11.4 04/02/2018   PROLACTIN 129.1 (H) 01/30/2018   Lab Results  Component Value Date   CHOL 170 01/30/2018   TRIG 91 01/30/2018   HDL 57 01/30/2018   LDLCALC 95 01/30/2018   Lab Results  Component Value Date   TSH 1.910 07/02/2018   TSH 1.95 04/02/2018    Therapeutic Level Labs: Lab Results  Component Value Date   LITHIUM 0.4 (L) 07/02/2018   LITHIUM 1.2 01/30/2018   No results found for: VALPROATE No components found for:  CBMZ  Current Medications: Current Outpatient Medications  Medication Sig Dispense Refill  . albuterol (2.5 MG/3ML) 0.083% NEBU 3 mL, albuterol (5 MG/ML) 0.5% NEBU 0.5 mL Inhale into the lungs daily as needed.    Marland Kitchen albuterol (PROAIR  HFA) 108 (90 Base) MCG/ACT inhaler Inhale 2 puffs into the lungs every 6 (six) hours as needed for wheezing or shortness of breath.    . Armodafinil 150 MG tablet Take 1 tablet (150 mg total) by mouth daily. 30 tablet 5  . EPIPEN 2-PAK 0.3 MG/0.3ML SOAJ injection as needed.    Marland Kitchen  lamoTRIgine (LAMICTAL) 100 MG tablet 100 mg 2 (two) times daily.    Marland Kitchen lithium carbonate (ESKALITH) 450 MG CR tablet Take 1 tablet (450 mg total) by mouth daily. 90 tablet 0  . LORazepam (ATIVAN) 1 MG tablet Take 1 tablet (1 mg total) by mouth 2 (two) times daily. 60 tablet 2  . montelukast (SINGULAIR) 10 MG tablet Take 10 mg by mouth at bedtime.    . Multiple Vitamins-Minerals (HAIR SKIN AND NAILS FORMULA PO) Take 1 tablet by mouth daily.     . Multiple Vitamins-Minerals (MULTIVITAMIN WITH MINERALS) tablet Take 1 tablet by mouth daily.    . promethazine (PHENERGAN) 25 MG tablet TAKE ONE TABLET BY MOUTH EVERY 6 HOURS AS NEEDED FOR NAUSEA AND VOMITING 30 tablet 8  . Sodium Oxybate 500 MG/ML SOLN Please take 3 g at bedtime. Repeat 3.5 g 2 hours later/ total dose 6 g nightly 540 mL 0  . SUMAtriptan (IMITREX) 100 MG tablet Take 0.5-1 tablets (50-100 mg total) by mouth every 2 (two) hours as needed for migraine. May repeat in 2 hours if headache persists or recurs. 10 tablet 2  . FLUoxetine (PROZAC) 10 MG capsule Take 1 capsule (10 mg total) by mouth every morning. 90 capsule 0   Current Facility-Administered Medications  Medication Dose Route Frequency Provider Last Rate Last Dose  . diclofenac sodium (VOLTAREN) 1 % transdermal gel 2 g  2 g Topical QID PRN Aundra Dubin, PA-C         Musculoskeletal: Strength & Muscle Tone: within normal limits Gait & Station: normal Patient leans: N/A  Psychiatric Specialty Exam: ROS  Blood pressure 115/82, pulse 90, height 5' 1.5" (1.562 m), weight 132 lb (59.9 kg), SpO2 98 %.Body mass index is 24.54 kg/m.  General Appearance: Casual and Fairly Groomed  Eye Contact:  Fair   Speech:  Clear and Coherent and Normal Rate  Volume:  Normal  Mood:  Depressed  Affect:  Congruent and Depressed  Thought Process:  Coherent, Goal Directed and Descriptions of Associations: Intact  Orientation:  Full (Time, Place, and Person)  Thought Content: Logical   Suicidal Thoughts:  No  Homicidal Thoughts:  No  Memory:  Immediate;   Good  Judgement:  Fair  Insight:  Shallow  Psychomotor Activity:  Normal  Concentration:  Concentration: Fair  Recall:  [place] of Knowledge: Good  Language: Good  Akathisia:  Negative  Handed:  Right  AIMS (if indicated): not done  Assets:  Agricultural consultant Housing Social Support  ADL's:  Intact  Cognition: WNL  Sleep:  Fair   Screenings: PHQ2-9     Office Visit from 04/07/2015 in [facility]  PHQ-2 Total Score  4  PHQ-9 Total Score  14       Assessment and Plan:  JAYLYNE BREESE is a 37 year old female with PTSD, bipolar 2, narcolepsy, and cluster B personality who presents today for psychiatric follow-up medication management.    She remains on Lamictal and lithium, and we agreed to increase lithium to 450 mg for a more therapeutic dose.  She will return to office in 5 days for medical lab checkup of her kidney function and lithium levels.  We agreed to augment her regimen with a low-dose of fluoxetine for her PTSD and other mood symptoms.  She does not present with any acute safety issues, and is actively engaged in individual therapy and seems to be having benefit in terms of learning coping strategies.  1.  Bipolar II disorder ([place])   2. Bipolar I disorder, most recent episode mixed ([place])     Status of current problems: unchanged  Labs Ordered: No orders of the defined types were placed in this encounter.   Labs Reviewed: Lab Results  Component Value Date   LITHIUM 0.4 (L) 07/02/2018   NA 142 07/02/2018   BUN 8 07/02/2018   CREATININE 0.85 07/02/2018   TSH  1.910 07/02/2018   WBC 7.3 07/02/2018     Collateral Obtained/Records Reviewed: husband present as above  Plan:  Lamictal 100 mg BID; patient never ended up increasing to the 200 mg twice a day dose Increase lithium to 450 mg nightly Referral for ECT treatment; patient has been apprehensive about ECT Patient has failed trials with Seroquel, Geodon, Latuda, cariprazine, Rexulti Encourage ongoing individual therapy  I spent 25 minutes with the patient in direct face-to-face clinical care.  Greater than 50% of this time was spent in counseling and coordination of care with the patient.  Aundra Dubin, MD 07/04/2018, 11:16 AM

## 2018-07-11 ENCOUNTER — Ambulatory Visit (HOSPITAL_COMMUNITY): Payer: BLUE CROSS/BLUE SHIELD

## 2018-07-11 DIAGNOSIS — Z79899 Other long term (current) drug therapy: Secondary | ICD-10-CM

## 2018-07-11 DIAGNOSIS — F3181 Bipolar II disorder: Secondary | ICD-10-CM

## 2018-07-11 NOTE — Progress Notes (Signed)
Patient presented this morning for her Lithium level ordered by Dr. Daron Offer. Patient tolerated the procedure well and without complaint and will call with any questions

## 2018-07-13 LAB — LITHIUM LEVEL: Lithium Lvl: 0.5 mmol/L — ABNORMAL LOW (ref 0.6–1.2)

## 2018-07-16 ENCOUNTER — Encounter (HOSPITAL_COMMUNITY): Payer: Self-pay

## 2018-08-13 ENCOUNTER — Ambulatory Visit (HOSPITAL_COMMUNITY): Payer: Self-pay | Admitting: Psychiatry

## 2018-08-26 ENCOUNTER — Ambulatory Visit (INDEPENDENT_AMBULATORY_CARE_PROVIDER_SITE_OTHER): Payer: BLUE CROSS/BLUE SHIELD | Admitting: Orthopedic Surgery

## 2018-08-26 ENCOUNTER — Encounter (INDEPENDENT_AMBULATORY_CARE_PROVIDER_SITE_OTHER): Payer: Self-pay | Admitting: Orthopedic Surgery

## 2018-08-26 ENCOUNTER — Ambulatory Visit (INDEPENDENT_AMBULATORY_CARE_PROVIDER_SITE_OTHER): Payer: BLUE CROSS/BLUE SHIELD

## 2018-08-26 VITALS — Ht 61.0 in | Wt 132.0 lb

## 2018-08-26 DIAGNOSIS — T85848S Pain due to other internal prosthetic devices, implants and grafts, sequela: Secondary | ICD-10-CM

## 2018-08-26 NOTE — Progress Notes (Signed)
Office Visit Note   Patient: Debra Nelson           Date of Birth: February 22, 1981           MRN: [id number] Visit Date: 08/26/2018              Requested by: Marin Olp, MD [place], [place] [address] PCP: Marin Olp, MD  Chief Complaint  Patient presents with  . Right Foot - Pain    10/2015 hx for gastroc recession and GT MTP fusion       HPI: Patient is a 37 year old woman who is 2 years status post gastrocnemius recession and fusion of the great toe MTP joint.  Patient states she has been having pain from a prominence over the dorsum of the foot hardware.  Patient states she does have some edema.  Assessment & Plan: Visit Diagnoses:  1. Pain from implanted hardware, sequela     Plan: Discussed with the patient has been some backing out of the hardware.  Discussed that if this is symptomatic that we could proceed with removal of the deep retained hardware as an outpatient surgery.  Risks and benefits were discussed.  Patient states she understands she will discuss this with husband and call if she wants to schedule surgery  Follow-Up Instructions: No follow-ups on file.   Ortho Exam  Patient is alert, oriented, no adenopathy, well-dressed, normal affect, normal respiratory effort. Examination patient is a good dorsalis pedis pulse the fusion site is stable there is some prominent hardware which is symptomatic there is no ulcers no cellulitis no swelling no signs of infection.  Imaging: No results found. No images are attached to the encounter.  Labs: Lab Results  Component Value Date   HGBA1C 4.9 01/30/2018   ESRSEDRATE 10 12/18/2017   ESRSEDRATE 2 05/02/2017   CRP 0.8 12/18/2017   CRP 8.8 (H) 05/02/2017   LABURIC 3.4 12/18/2017   REPTSTATUS 06/01/2008 FINAL 05/30/2008   CULT ESCHERICHIA COLI 05/30/2008   LABORGA ESCHERICHIA COLI 05/30/2008     Lab Results  Component Value Date   ALBUMIN 4.7 07/02/2018   ALBUMIN 4.7 04/02/2018     ALBUMIN 5.0 01/30/2018   LABURIC 3.4 12/18/2017    Body mass index is 24.94 kg/m.  Orders:  Orders Placed This Encounter  Procedures  . XR Foot Complete Right   No orders of the defined types were placed in this encounter.    Procedures: No procedures performed  Clinical Data: No additional findings.  ROS:  All other systems negative, except as noted in the HPI. Review of Systems  Objective: Vital Signs: Ht 5\' 1"  (1.549 m)   Wt 132 lb (59.9 kg)   BMI 24.94 kg/m   Specialty Comments:  No specialty comments available.  PMFS History: Patient Active Problem List   Diagnosis Date Noted  . Recurrent isolated sleep paralysis 07/02/2018  . Encounter for medication management 07/02/2018  . Menstrual migraine without status migrainosus, not intractable 01/02/2018  . Chronic thumb pain, bilateral 11/23/2017  . Elevated TSH 05/07/2017  . Trigger thumb, left thumb 03/15/2017  . Chronic post-traumatic stress disorder (PTSD) 02/26/2017  . Sensorineural hearing loss (SNHL) of both ears 12/12/2016  . Asthma 10/27/2016  . GERD (gastroesophageal reflux disease) 10/27/2016  . Seasonal allergic rhinitis 05/02/2016  . Right ear pain 05/02/2016  . Dysfunction of both eustachian tubes 05/02/2016  . Narcolepsy cataplexy syndrome 10/19/2015  . Intractable migraine with aura with status migrainosus 10/19/2015  .  Narcolepsy and cataplexy 03/06/2014  . Bipolar II disorder ([place]) 12/31/2013  . Chondromalacia of knee 03/07/2013   Past Medical History:  Diagnosis Date  . Asthma   . Bipolar disorder with depression ([place])    and PTSD  . Chicken pox   . Deviated septum   . DJD (degenerative joint disease)    Severe of the right knee-Dr Creighton-ortho and Dr Gean Birchwood, PA-preferred pain managment  . GERD (gastroesophageal reflux disease)   . Seasonal allergic rhinitis     Family History  Problem Relation Age of Onset  . Mental illness Mother        does not speak to  regularly so does not know full history  . Other Father        medical issues, divorce related  . Other Brother        ptsd- fully discharged medically disabled before age 30  . Cancer Paternal Grandfather     Past Surgical History:  Procedure Laterality Date  . knee surgery x5     08,11, 12, 13 x2- R knee  . right foot surgery     arthritis/bone spurus. plate placed.   . WISDOM TOOTH EXTRACTION     Social History   Occupational History    Employer: OTHER  Tobacco Use  . Smoking status: Never Smoker  . Smokeless tobacco: Never Used  Substance and Sexual Activity  . Alcohol use: No  . Drug use: No  . Sexual activity: Yes    Partners: Male    Birth control/protection: Pill

## 2018-08-28 ENCOUNTER — Telehealth (INDEPENDENT_AMBULATORY_CARE_PROVIDER_SITE_OTHER): Payer: Self-pay | Admitting: Orthopedic Surgery

## 2018-08-28 NOTE — Telephone Encounter (Signed)
[place] surgery sheet completed

## 2018-08-28 NOTE — Telephone Encounter (Signed)
Debra Nelson has decided that she would like to go ahead with surgery to have her hardware removed from her foot.  She states that it is painful and she would like this done as soon as possible.  Will you please fill out a surgery sheet for her and I will call her back to schedule surgery. Thank you

## 2018-09-09 ENCOUNTER — Encounter (INDEPENDENT_AMBULATORY_CARE_PROVIDER_SITE_OTHER): Payer: Self-pay | Admitting: Orthopedic Surgery

## 2018-09-10 ENCOUNTER — Encounter (INDEPENDENT_AMBULATORY_CARE_PROVIDER_SITE_OTHER): Payer: Self-pay | Admitting: Orthopedic Surgery

## 2018-09-10 ENCOUNTER — Encounter: Payer: Self-pay | Admitting: Orthopedic Surgery

## 2018-09-10 ENCOUNTER — Telehealth (INDEPENDENT_AMBULATORY_CARE_PROVIDER_SITE_OTHER): Payer: Self-pay | Admitting: *Deleted

## 2018-09-10 DIAGNOSIS — T84116S Breakdown (mechanical) of internal fixation device of bone of right lower leg, sequela: Secondary | ICD-10-CM

## 2018-09-10 NOTE — Telephone Encounter (Signed)
I have emailed pt she will come in tomorrow around 8:45-9 am for post op shoe. Will hold message until received tomorrow

## 2018-09-10 NOTE — Telephone Encounter (Signed)
Pt had sx this am and was told by Dr. Sharol Given at the surgical center she would be in a surgical shoe/boot but states never received one from the surgical center, wanted to know if he changed his mind about the shoe, if not what do they need to do as in gettiing one.   Please call [phone]

## 2018-09-11 ENCOUNTER — Encounter (INDEPENDENT_AMBULATORY_CARE_PROVIDER_SITE_OTHER): Payer: Self-pay | Admitting: Orthopedic Surgery

## 2018-09-11 NOTE — Telephone Encounter (Signed)
Pt in this morning to obtain shoe.

## 2018-09-16 ENCOUNTER — Ambulatory Visit (INDEPENDENT_AMBULATORY_CARE_PROVIDER_SITE_OTHER): Payer: BLUE CROSS/BLUE SHIELD | Admitting: Orthopedic Surgery

## 2018-09-16 ENCOUNTER — Encounter (INDEPENDENT_AMBULATORY_CARE_PROVIDER_SITE_OTHER): Payer: Self-pay | Admitting: Orthopedic Surgery

## 2018-09-16 VITALS — Ht 61.0 in | Wt 132.0 lb

## 2018-09-16 DIAGNOSIS — T85848S Pain due to other internal prosthetic devices, implants and grafts, sequela: Secondary | ICD-10-CM

## 2018-09-16 NOTE — Progress Notes (Signed)
Office Visit Note   Patient: Debra Nelson           Date of Birth: 06-25-1981           MRN: [id number] Visit Date: 09/16/2018              Requested by: Marin Olp, MD [place], [place] [address] PCP: Marin Olp, MD  Chief Complaint  Patient presents with  . Right Foot - Routine Post Op    Pain from implanted hardware      HPI: Patient is a 37 year old woman who presents status post removal of deep retained hardware for MTP fusion right great toe.  Patient states that she has been nonweightbearing  but presents with her foot very swollen and draining.  And is not using a walker that she states is in the car.  Assessment & Plan: Visit Diagnoses:  1. Pain from implanted hardware, sequela     Plan: Discussed the importance of being nonweightbearing and elevating the extremity.  She will begin Dial soap cleansing dry dressing change daily.  Follow-Up Instructions: Return in about 1 week (around 09/23/2018).   Ortho Exam  Patient is alert, oriented, no adenopathy, well-dressed, normal affect, normal respiratory effort. On examination patient has significant swelling of the foot there is no cellulitis no signs of infection there is clear serous drainage from the incision the incision is gaped open about a millimeter.  Imaging: No results found. No images are attached to the encounter.  Labs: Lab Results  Component Value Date   HGBA1C 4.9 01/30/2018   ESRSEDRATE 10 12/18/2017   ESRSEDRATE 2 05/02/2017   CRP 0.8 12/18/2017   CRP 8.8 (H) 05/02/2017   LABURIC 3.4 12/18/2017   REPTSTATUS 06/01/2008 FINAL 05/30/2008   CULT ESCHERICHIA COLI 05/30/2008   LABORGA ESCHERICHIA COLI 05/30/2008     Lab Results  Component Value Date   ALBUMIN 4.7 07/02/2018   ALBUMIN 4.7 04/02/2018   ALBUMIN 5.0 01/30/2018   LABURIC 3.4 12/18/2017    Body mass index is 24.94 kg/m.  Orders:  No orders of the defined types were placed in this  encounter.  No orders of the defined types were placed in this encounter.    Procedures: No procedures performed  Clinical Data: No additional findings.  ROS:  All other systems negative, except as noted in the HPI. Review of Systems  Objective: Vital Signs: Ht 5\' 1"  (1.549 m)   Wt 132 lb (59.9 kg)   BMI 24.94 kg/m   Specialty Comments:  No specialty comments available.  PMFS History: Patient Active Problem List   Diagnosis Date Noted  . Recurrent isolated sleep paralysis 07/02/2018  . Encounter for medication management 07/02/2018  . Menstrual migraine without status migrainosus, not intractable 01/02/2018  . Chronic thumb pain, bilateral 11/23/2017  . Elevated TSH 05/07/2017  . Trigger thumb, left thumb 03/15/2017  . Chronic post-traumatic stress disorder (PTSD) 02/26/2017  . Sensorineural hearing loss (SNHL) of both ears 12/12/2016  . Asthma 10/27/2016  . GERD (gastroesophageal reflux disease) 10/27/2016  . Seasonal allergic rhinitis 05/02/2016  . Right ear pain 05/02/2016  . Dysfunction of both eustachian tubes 05/02/2016  . Narcolepsy cataplexy syndrome 10/19/2015  . Intractable migraine with aura with status migrainosus 10/19/2015  . Narcolepsy and cataplexy 03/06/2014  . Bipolar II disorder ([place]) 12/31/2013  . Chondromalacia of knee 03/07/2013   Past Medical History:  Diagnosis Date  . Asthma   . Bipolar disorder with depression (  [place])    and PTSD  . Chicken pox   . Deviated septum   . DJD (degenerative joint disease)    Severe of the right knee-Dr Creighton-ortho and Dr Gean Birchwood, PA-preferred pain managment  . GERD (gastroesophageal reflux disease)   . Seasonal allergic rhinitis     Family History  Problem Relation Age of Onset  . Mental illness Mother        does not speak to regularly so does not know full history  . Other Father        medical issues, divorce related  . Other Brother        ptsd- fully discharged medically disabled  before age 52  . Cancer Paternal Grandfather     Past Surgical History:  Procedure Laterality Date  . knee surgery x5     08,11, 12, 13 x2- R knee  . right foot surgery     arthritis/bone spurus. plate placed.   . WISDOM TOOTH EXTRACTION     Social History   Occupational History    Employer: OTHER  Tobacco Use  . Smoking status: Never Smoker  . Smokeless tobacco: Never Used  Substance and Sexual Activity  . Alcohol use: No  . Drug use: No  . Sexual activity: Yes    Partners: Male    Birth control/protection: Pill

## 2018-09-19 ENCOUNTER — Encounter: Payer: Self-pay | Admitting: Family Medicine

## 2018-09-20 ENCOUNTER — Encounter: Payer: Self-pay | Admitting: Family Medicine

## 2018-09-20 ENCOUNTER — Ambulatory Visit: Payer: BC Managed Care – PPO | Admitting: Family Medicine

## 2018-09-20 VITALS — BP 116/86 | HR 98 | Temp 98.4°F | Ht 61.0 in | Wt 129.0 lb

## 2018-09-20 DIAGNOSIS — R300 Dysuria: Secondary | ICD-10-CM

## 2018-09-20 LAB — POCT URINALYSIS DIPSTICK
Blood, UA: NEGATIVE
Glucose, UA: NEGATIVE
Nitrite, UA: POSITIVE
Protein, UA: NEGATIVE
Spec Grav, UA: 1.015 (ref 1.010–1.025)
Urobilinogen, UA: 4 E.U./dL — AB
pH, UA: 8 (ref 5.0–8.0)

## 2018-09-20 MED ORDER — SULFAMETHOXAZOLE-TRIMETHOPRIM 800-160 MG PO TABS: 1.0000 | ORAL_TABLET | Freq: Two times a day (BID) | ORAL | 0 refills | Status: DC

## 2018-09-20 NOTE — Progress Notes (Signed)
Patient: Debra Nelson MRN: [id number] DOB: 1981-04-10 PCP: Marin Olp, MD     Subjective:  Chief Complaint  Patient presents with  . Dysuria  . Urinary Frequency    HPI: The patient is a 37 y.o. female who presents today for urinary frequency. Symptoms just started today with dysuria at the end of urination and increased frequency. She has slight urgency. She did the at home stick and read may have UTI. She has also taken uristat that is over the counter and sounds like AZO. No fever/chills. No CVA tenderness. She had surgery 1.5 weeks ago on her foot. She had an outpatient procedure and did not have an overnight stay. She denies any visible blood in her urine, odor or color change outside of using the azo drug.   Review of Systems  Constitutional: Negative for chills and fever.  Genitourinary: Positive for dysuria and frequency. Negative for flank pain, hematuria, pelvic pain, urgency, vaginal discharge and vaginal pain.  Musculoskeletal: Negative for back pain.    Allergies Patient is allergic to other; phenergan [promethazine hcl]; promethazine; promethazine hcl; topamax [topiramate]; and klonopin [clonazepam].  Past Medical History Patient  has a past medical history of Asthma, Bipolar disorder with depression ([place]), Chicken pox, Deviated septum, DJD (degenerative joint disease), GERD (gastroesophageal reflux disease), and Seasonal allergic rhinitis.  Surgical History Patient  has a past surgical history that includes knee surgery x5; right foot surgery; and Wisdom tooth extraction.  Family History Pateint's family history includes Cancer in her paternal grandfather; Mental illness in her mother; Other in her brother and father.  Social History Patient  reports that she has never smoked. She has never used smokeless tobacco. She reports that she does not drink alcohol or use drugs.    Objective: Vitals:   09/20/18 1000  BP: 116/86  Pulse: 98  Temp: 98.4 F  (36.9 C)  TempSrc: Oral  SpO2: 97%  Weight: 129 lb (58.5 kg)  Height: 5\' 1"  (1.549 m)    Body mass index is 24.37 kg/m.  Physical Exam  Constitutional: She is oriented to person, place, and time. She appears well-developed and well-nourished.  Cardiovascular: Normal rate, regular rhythm and normal heart sounds.  Pulmonary/Chest: Effort normal and breath sounds normal.  Abdominal: Soft. Bowel sounds are normal. There is tenderness (mild ttp suprapubically ).  Musculoskeletal:  Negative CVA tenderness bilaterally   Neurological: She is alert and oriented to person, place, and time.  Vitals reviewed.  Ua: +nitrite, 1+ le      Assessment/plan: 1. Dysuria + nitrite even though could be skewed with the azo. With symtpoms and hx of cath will do 7 day course of bactrim. Will f/u on culture. Precautions given.  - POCT Urinalysis Dipstick - Urine Culture       Return if symptoms worsen or fail to improve.   Orma Flaming, MD [place]   09/20/2018

## 2018-09-20 NOTE — Patient Instructions (Signed)
7 day course of bactrim. Take it twice a day for 7 days. No NSAIDs and drink lots of water. i'll let you know what culture shows.  Have a great weekend!

## 2018-09-21 LAB — URINE CULTURE
MICRO NUMBER:: 91317619
SPECIMEN QUALITY:: ADEQUATE

## 2018-09-23 ENCOUNTER — Encounter: Payer: Self-pay | Admitting: Family Medicine

## 2018-10-07 ENCOUNTER — Telehealth: Payer: Self-pay | Admitting: Neurology

## 2018-10-07 NOTE — Telephone Encounter (Signed)
PA submitted for Xyrem through cover my meds/ [facility] through [employer].  [id number] Sent sleep study and office notes in as well. Will hear a response within 24 hrs.

## 2018-10-08 NOTE — Telephone Encounter (Signed)
PA approved through [facility] 10/07/18-10/08/2019.  PA# [place] [phone] CD

## 2018-11-26 ENCOUNTER — Other Ambulatory Visit: Payer: Self-pay | Admitting: Neurology

## 2018-12-01 ENCOUNTER — Other Ambulatory Visit: Payer: Self-pay | Admitting: Neurology

## 2018-12-23 ENCOUNTER — Telehealth: Payer: Self-pay | Admitting: Neurology

## 2018-12-23 NOTE — Telephone Encounter (Signed)
PA approved from 12/23/18-12/24/2019 through [facility].

## 2018-12-23 NOTE — Telephone Encounter (Signed)
PA submitted through cover my meds/[facility] for Armodafinil. ERD:[id number]. Can take up to 24 hrs before hearing back.

## 2019-01-02 ENCOUNTER — Ambulatory Visit (INDEPENDENT_AMBULATORY_CARE_PROVIDER_SITE_OTHER): Payer: BC Managed Care – PPO | Admitting: Neurology

## 2019-01-02 ENCOUNTER — Encounter: Payer: Self-pay | Admitting: Neurology

## 2019-01-02 VITALS — BP 129/88 | HR 97 | Ht 61.5 in | Wt 136.0 lb

## 2019-01-02 DIAGNOSIS — G4753 Recurrent isolated sleep paralysis: Secondary | ICD-10-CM | POA: Diagnosis not present

## 2019-01-02 DIAGNOSIS — K219 Gastro-esophageal reflux disease without esophagitis: Secondary | ICD-10-CM | POA: Diagnosis not present

## 2019-01-02 DIAGNOSIS — F3181 Bipolar II disorder: Secondary | ICD-10-CM

## 2019-01-02 DIAGNOSIS — G47411 Narcolepsy with cataplexy: Secondary | ICD-10-CM

## 2019-01-02 DIAGNOSIS — G43829 Menstrual migraine, not intractable, without status migrainosus: Secondary | ICD-10-CM

## 2019-01-02 DIAGNOSIS — Z79899 Other long term (current) drug therapy: Secondary | ICD-10-CM

## 2019-01-02 NOTE — Progress Notes (Addendum)
[facility]   Provider:  Larey Seat, MD   Referring Provider/ Primary Care Physician:  Debra Olp, MD   Cc Debra May, MD  Chief Complaint  Patient presents with  . Follow-up    pt alone, rm 11, pt states     RV 01-02-2019, meeting for refills. Xyrem user at  3.5 gram for first dose and 4.5 second dose. She is today suffering form a GI infection and last night was affected, sleep reportedly poor. She lost weight. She quit Sodas- and Debra sugar intake reduction helped with fatigue. She hydrates more with water, and she goes to Debra bathroom more often.   XYREM and NUVIGIL controlled her hypersomnia and lithium, Buspar and lamictal controlled her anxiety/ Bipolar disorder.    XYREM has alleviated her cataplexy - No need for tofranil.  Increased XYREM 2 month ago to 3.5 gram first dose and 4.5 g second dose nightly.  Parasomnia/ sleep walking activity on XYREM has ceased after Debra dose was reduced from 4 to 3 gram twice at night but Xyrem was less effective.  She reports now not having parasomnia activity in spite of higher dosing.      I have Debra pleasure of seeing Debra Nelson in Debra presence of her husband here today on 02 July 2018. She is a longtime established narcolepsy patient in our practice who is taking Xyrem.  She has overall achieved very good control of hypersomnia but we had to find Debra right dose for her liquid nightly trigger sleepwalking episodes nor allow her to resume her nightmarish dreams.  For many months she did well at 3 g twice at night, however Debra dreams have now returned in Debra early morning hours following Debra second dose by probably 2 hours also.  We could increase Debra second Xyrem dose to 3.5 g, and Nelson be divided Debra 2 doses by a longer interval.  Of 2-1/2 hours.  This Nelson cover Debra second half of Debra night better, but it puts her at risk of daytime sleepiness if Debra Xyrem is still in her system.  Usually she works at 7.30 AM . This  allows Debra intake time to be 1.30 AM, Debra first dose around 22.00 hours.     Debra Nelson is a 38 y.o. female , seen here as a revisit from Dr. Yong Channel for treatment of hypersomnia, HLA positive , diagnosed with Narcolepsy. Debra Nelson has been titrated to Xyrem which has given her final several years of good control of hypersomnia.  Originally I had titrated her to Debra maximum dose of 4.5 g twice at night but she began to sleep walk and had other parasomnia activity, Debra dose was reduced to 3 g twice at night under which she did well for Debra last 18 months.  She now reports that she is not sleeping nearly as deep, she has breakthrough dream activity and her husband confirms that it is easier to wake her than it used to be.  Since she has room to further increase Debra dose we can go from here to at least 3.5 g twice at night and see if further adjustments are necessary.  I would like this process to go slow as not to reintroduce sleepwalking. Debra fatigue severity score was endorsed at 42 points and Debra Epworth sleepiness score at 8 points.   This is still a great effect in comparison to Debra untreated Epworth sleepiness score.   Today is 07/02/2017, And as Debra pleasure  of seeing Debra Nelson in Debra presence of her young daughter. Debra patient endorses only 3 points on her Epworth Sleepiness Scale today, 22% on her fatigue score and is doing very well in terms of control of sleepiness, cataplexy and narcolepsy. She has lost weight. She finally got off Soda. She is not employed right now.  Her husband was retired from Debra police due to DDD.    08-31-2016  Debra Nelson is a 38 y.o. female , seen here as a revisit from Dr. Yong Channel for treatment of hypersomnia, HLA positive , diagnosed with Narcolepsy and cataplexy . Debra patient developed sleepwalking episodes on Xyrem, but she could not find control of her narcolepsy and cataplexy without Debra medication. For this reason she has restarted apnea  monitoring her closely. Her fatigue severity scale today is 47, Epworth sleepiness score is 6 points on Xyrem with armodafinil . Debra Nelson has now been again covered by her insurance not longer on Tofranil-.Mood overall is good, less variability, not euphoric.Not suicidal - no intent , no plan.   Debra Nelson reports morning headaches frequently now, my concern is that Xyrem Nelson induce hypoventilation that she could be a CO2 retainer. 3 gram of Xyrem has controlled sleepiness and not allowed sleepwaling, but still has vivid dreams.   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  She has started to sleep walk almost nightly and severely. Debra discussion as to discontinue Debra Xyrem and rely modafinil only- but that would not affect her cataplexy. Currently she has not taken Xyrem for 12 days but is not sleep walking, but her cataplexy is poorly controlled. I would like very much for her to go back on Xyrem but it has to be in a safe environment for she can be watched not vice versa. I will give her Tofranil today to control Debra cataplexy as long as she is not able to go back on Xyrem and I would refill modafinil.-Nuvigil. This helped a lot in day time. ( Due to parasomnia activity Xyrem is currently on hold. Her husband needs to be back on his feet before we can stop Debra medication again I also will give her Tofranil to help control cataplexy be continued Nuvigil in daytime. They can change Debra dosing of Xyrem to Debra first dose being given at 4.5 g at night and Debra second dose to be 2 or even 3 g less. Sometimes this will help to prevent parasomnias in Debra morning hours. I would also like for her to have a motion detector at Debra door of Debra bedroom so that she Nelson wake up or others are alerted to her activity. I would like to recommend her to lock all doors and especially  keeps a car keys in a pocket or a drawer but they are not visible)  Chief complaint according to patient :" Recurrent hypersomnia "after Debra patient's medications were no longer approved by insurance.  She was changed from Nuvigil to Provigil and has not responded well.Debra patient is an HLA positive narcoleptic with co-morbidities of  Bipolar disorder. This limits Debra use of stimulants in her treatment.  She was changed from [place] to [place] by Psychiatry, and her anxiety level was lower than it had been in a long time.Debra patient felt that her psychiatric condition has been very well controlled with Debra new  LATUDA  Medication- her anxiety level decreased she was much less irritable. Shortly after that  Debra medication Nuvigil was not longer covered and she is now sleepier and less attentive.  Sleep studies : see media. Debra patient has been on Xyrem, and lost weight in Debra process.   Interval 10-19-15, Debra Nelson reports that she had a good, but very busy Thanksgiving and that she has been Debra main caretaker of her husband , who is recovering from her surgery. Overall she has lost more weight since being on Xyrem. Her insurance stopped covering Debra Nuvigil as I had reported at her last visit she was changed from Nuvigil to Provigil but had not responded as well and especially not to Debra generic form. She has noted that it takes her longer to fall asleep on Xyrem.  She needs to get back on Nuvigil for daytime sleepiness. Propranolol seems to work less for headache prophylaxis. Debra recurrence of headaches can also be related to Debra discontinuation of Nuvigil.    Review of Systems: Out of a complete 14 system review, Debra patient complains of only Debra following symptoms, and all other reviewed systems are negative. Fatigue severity score  Again 28 from 41 (Xyrem)  from 53, before xyrem  Epworth Sleepiness score endorsed at 07-02-2018  2/ 24 , dreams are coming back before she wakes up. Nightmarish-  She is  not sleep walking - she was at 4.5 gram at Debra time. She dropped Debra dose to first 3.0 gram and Debra second to 3.0 - will now go to 3.5 at sec dose.    depression score PHQ 9 :  How likely are you to doze in Debra following situations: 0 = not likely, 1 = slight chance, 2 = moderate chance, 3 = high chance  Sitting and Reading? 1 Watching Television?1 Sitting inactive in a public place (theater or meeting)?1 As a passenger in a car for an hour without a break? 1 Lying down in Debra afternoon when circumstances permit? 2 Sitting and talking to someone?0 Sitting quietly after lunch without alcohol? 1 In a car, while stopped for a few minutes in traffic?0   Total =7 on Xyrem , 01-02-2019   Recurrent dreams, no cataplexy.  Snoring  Memory loss. multitasking difficulties.   Office Visit from 04/07/2015 in [facility]  PHQ-9 Total Score  14        Social History   Socioeconomic History  . Marital status: Married    Spouse name: [name]  . Number of children: 4  . Years of education: College  . Highest education level: Not on file  Occupational History    Employer: OTHER  Social Needs  . Financial resource strain: Not on file  . Food insecurity:    Worry: Not on file    Inability: Not on file  . Transportation needs:    Medical: Not on file    Non-medical: Not on file  Tobacco Use  . Smoking status: Never Smoker  . Smokeless tobacco: Never Used  Substance and Sexual Activity  . Alcohol use: No  . Drug use: No  . Sexual activity: Yes    Partners: Male    Birth control/protection: Pill  Lifestyle  . Physical activity:    Days per week: Not on file    Minutes per session: Not on file  . Stress: Not on file  Relationships  . Social connections:    Talks on phone: Not on file    Gets together:  Not on file    Attends religious service: Not on file    Active member of club or organization: Not on file    Attends meetings of clubs or organizations: Not  on file    Relationship status: Not on file  . Intimate partner violence:    Fear of current or ex partner: Not on file    Emotionally abused: Not on file    Physically abused: Not on file    Forced sexual activity: Not on file  Other Topics Concern  . Not on file  Social History Narrative   Patient is married ([name]) and lives at home with her family   4 adults - 2 kids 50% of Debra time (1 son, 1 daughter). 2 older children (one age 44 from rape, then 81 year old- does not get to see- related to bipolar) Moved in with in laws to help them      Patient has 4 children (3 of her own and 1 step son).    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   Patient is right-handed.    Family History  Problem Relation Age of Onset  . Mental illness Mother        does not speak to regularly so does not know full history  . Other Father        medical issues, divorce related  . Other Brother        ptsd- fully discharged medically disabled before age 36  . Cancer Paternal Grandfather     Past Medical History:  Diagnosis Date  . Asthma   . Bipolar disorder with depression ([place])    and PTSD  . Chicken pox   . Deviated septum   . DJD (degenerative joint disease)    Severe of Debra right knee-Dr Creighton-ortho and Dr Gean Birchwood, PA-preferred pain managment  . GERD (gastroesophageal reflux disease)   . Seasonal allergic rhinitis     Past Surgical History:  Procedure Laterality Date  . knee surgery x5     08,11, 12, 13 x2- R knee  . right foot surgery     arthritis/bone spurus. plate placed.   . WISDOM TOOTH EXTRACTION      Current Outpatient Medications  Medication Sig Dispense Refill  . albuterol (2.5 MG/3ML) 0.083% NEBU 3 mL, albuterol (5 MG/ML) 0.5% NEBU 0.5 mL Inhale into Debra lungs daily as needed.    Marland Kitchen albuterol (PROAIR HFA) 108 (90 Base) MCG/ACT inhaler Inhale 2 puffs into  Debra lungs every 6 (six) hours as needed for wheezing or shortness of breath.    . ALPRAZolam (XANAX) 0.5 MG tablet Take 0.5 mg by mouth every morning.     . Armodafinil 150 MG tablet Take 1 tablet (150 mg total) by mouth daily. 30 tablet 5  . busPIRone (BUSPAR) 30 MG tablet 30 mg 2 (two) times daily.    Marland Kitchen EPIPEN 2-PAK 0.3 MG/0.3ML SOAJ injection as needed.    . lamoTRIgine (LAMICTAL) 150 MG tablet Take 150 mg by mouth 2 (two) times daily.     Marland Kitchen levocetirizine (XYZAL) 5 MG tablet Take 5 mg by mouth every evening.    . lithium carbonate (ESKALITH) 450 MG CR tablet Take 1 tablet (450 mg total) by mouth daily. 90 tablet 0  . montelukast (SINGULAIR) 10 MG tablet Take 10 mg by mouth at bedtime.    Marland Kitchen  Multiple Vitamins-Minerals (HAIR SKIN AND NAILS FORMULA PO) Take 1 tablet by mouth daily.     . Multiple Vitamins-Minerals (MULTIVITAMIN WITH MINERALS) tablet Take 1 tablet by mouth daily.    . promethazine (PHENERGAN) 25 MG tablet TAKE ONE TABLET BY MOUTH EVERY 6 HOURS AS NEEDED FOR NAUSEA AND VOMITING 30 tablet 8  . Sodium Oxybate 500 MG/ML SOLN Please take 3 g at bedtime. Repeat 3.5 g 2 hours later/ total dose 6 g nightly 540 mL 0  . SUMAtriptan (IMITREX) 100 MG tablet TAKE 1/2 TO 1 TABLET BY MOUTH EVERY 2 HOURS AS NEEDED FOR MIGRAINE, Nelson REPEAT IN 2 HOURS IF HEADACHE RECURS. 10 tablet 1   Current Facility-Administered Medications  Medication Dose Route Frequency Provider Last Rate Last Dose  . diclofenac sodium (VOLTAREN) 1 % transdermal gel 2 g  2 g Topical QID PRN Dwana Melena L, PA-C        Allergies as of 01/02/2019 - Review Complete 01/02/2019  Allergen Reaction Noted  . Other Other (See Comments) 03/07/2013  . Phenergan [promethazine hcl]  12/26/2011  . Promethazine Other (See Comments) 03/07/2013  . Promethazine hcl Other (See Comments) 12/26/2011  . Topamax [topiramate] Nausea And Vomiting 12/19/2013  . Klonopin [clonazepam] Nausea Only 12/19/2013   Vitals: BP 129/88   Pulse 97    Ht 5' 1.5" (1.562 m)   Wt 136 lb (61.7 kg)   BMI 25.28 kg/m  Last Weight:  Wt Readings from Last 1 Encounters:  01/02/19 136 lb (61.7 kg)   ONG:EXBM mass index is 25.28 kg/m.     Last Height:   Ht Readings from Last 1 Encounters:  01/02/19 5' 1.5" (1.562 m)    Physical exam:  General: Debra patient is awake, alert and appears relaxed.  Head: Normocephalic, atraumatic.   Mallampati 2- crowded lower jaw,  neck circumference:14 inches.  Nasal airflow unrestricted. Reddened airway, irritated , puffy.  TMJ click not evident . Retrognathia is not seen.  Cardiovascular:  Regular rate and rhythm. Respiratory:  Bronchitic cough,  Skin:  Without evidence of edema, or rash Trunk: BMI is 25 kg/m2  Neurologic exam : Debra patient is awake and alert, oriented to place and time.    Memory subjective described as intact.   Speech is not pressured, is fluent, without dysarthria, dysphonia. No tremor in her voice.  Mood and affect are appropriate, she is conversant and seems happy. Attention is normal.   Taste and smell are reportedly intact. Pupils are equal and briskly reactive to light and accomodation . Extraocular movements  in vertical and horizontal planes intact and without nystagmus. Visual fields by finger perimetry are intact.  Facial motor strength is symmetric and tongue and uvula move midline. Shoulder shrug was symmetrical.  Motor: intact strength, no focal weakness, symmetric muscle bulk and tone. Cooordination: no change in penmanship, no tremor.  Sensory: Intact to primary modalities. Normal width of gait and good balance.  DTR: symmetric, brisk, no clonus. .    Assessment:  After physical and neurologic examination, review of laboratory studies,  Personal review of imaging studies, reports of other /same  Imaging studies ,   Results of polysomnography/ neurophysiology testing and pre-existing records as far as provided in visit.,   My assessment is: Narcolepsy, in Debra setting  of Bipolar mood disorder.  Patient assured me that her depression is well controlled.   Debra patient was advised of Debra nature of Debra diagnosed Narcolepsy (Narcolepsy confirmed by HLA -) , Debra treatment with Gust Brooms  and risks for general a health and wellness arising from not treating Debra condition. Debra patient has also migraines, but infrequently.  She will look into magnetic stimulation therapy for her depression, and was told she Nelson need ECT- which would cause more memory loss/ lasting impact on her mental health. Her psychiatrist is concerned about hypomania - and modafinil can push her off.   Continue XYREM at Debra dose you found most helpful, 3.5 and 4.5 gram at night po.   I spent more than 25 minutes of face to face time with Debra patient. Greater than 50% of time was spent in counseling and coordination of care. We have discussed Debra diagnosis and differential and I answered Debra patient's questions.     PS :  LFT, CMT. lithium test.  CBC diff TSH     Asencion Partridge Michille Mcelrath MD  01/02/2019   CC: Debra Olp, Md [address] [place], [place] [address]

## 2019-01-02 NOTE — Addendum Note (Signed)
Addended by: Larey Seat on: 01/02/2019 10:10 AM   Modules accepted: Orders

## 2019-01-03 ENCOUNTER — Encounter: Payer: Self-pay | Admitting: Neurology

## 2019-01-03 LAB — COMPREHENSIVE METABOLIC PANEL
ALT: 16 IU/L (ref 0–32)
AST: 17 IU/L (ref 0–40)
Albumin/Globulin Ratio: 2.8 — ABNORMAL HIGH (ref 1.2–2.2)
Albumin: 4.8 g/dL (ref 3.8–4.8)
Alkaline Phosphatase: 75 IU/L (ref 39–117)
BUN/Creatinine Ratio: 8 — ABNORMAL LOW (ref 9–23)
BUN: 7 mg/dL (ref 6–20)
Bilirubin Total: 0.4 mg/dL (ref 0.0–1.2)
CO2: 20 mmol/L (ref 20–29)
Calcium: 10 mg/dL (ref 8.7–10.2)
Chloride: 104 mmol/L (ref 96–106)
Creatinine, Ser: 0.84 mg/dL (ref 0.57–1.00)
GFR calc Af Amer: 101 mL/min/{1.73_m2} (ref >59)
GFR calc non Af Amer: 88 mL/min/{1.73_m2} (ref >59)
Globulin, Total: 1.7 g/dL (ref 1.5–4.5)
Glucose: 87 mg/dL (ref 65–99)
Potassium: 4.7 mmol/L (ref 3.5–5.2)
Sodium: 141 mmol/L (ref 134–144)
Total Protein: 6.5 g/dL (ref 6.0–8.5)

## 2019-01-03 LAB — TSH+FREE T4
Free T4: 1.35 ng/dL (ref 0.82–1.77)
TSH: 2.65 u[IU]/mL (ref 0.450–4.500)

## 2019-01-03 LAB — IRON AND TIBC
Iron Saturation: 15 % (ref 15–55)
Iron: 43 ug/dL (ref 27–159)
Total Iron Binding Capacity: 279 ug/dL (ref 250–450)
UIBC: 236 ug/dL (ref 131–425)

## 2019-01-03 LAB — VITAMIN D 25 HYDROXY (VIT D DEFICIENCY, FRACTURES): Vit D, 25-Hydroxy: 33.4 ng/mL (ref 30.0–100.0)

## 2019-01-03 LAB — LITHIUM LEVEL: Lithium Lvl: 0.5 mmol/L — ABNORMAL LOW (ref 0.6–1.2)

## 2019-01-21 ENCOUNTER — Encounter: Payer: Self-pay | Admitting: Neurology

## 2019-01-21 ENCOUNTER — Other Ambulatory Visit: Payer: Self-pay | Admitting: *Deleted

## 2019-01-21 MED ORDER — SODIUM OXYBATE 500 MG/ML PO SOLN: ORAL | 1 refills | Status: DC

## 2019-01-21 NOTE — Progress Notes (Signed)
See pt's mychart message. Xyrem dose was changed to 3.5gms qhs, 4.5gms 2 hrs. later. New rx. faxed to Xyrem [facility] program/fim

## 2019-02-02 ENCOUNTER — Other Ambulatory Visit: Payer: Self-pay | Admitting: Neurology

## 2019-02-04 ENCOUNTER — Encounter: Payer: Self-pay | Admitting: Physician Assistant

## 2019-02-04 ENCOUNTER — Ambulatory Visit (INDEPENDENT_AMBULATORY_CARE_PROVIDER_SITE_OTHER): Payer: BC Managed Care – PPO | Admitting: Physician Assistant

## 2019-02-04 ENCOUNTER — Other Ambulatory Visit: Payer: Self-pay

## 2019-02-04 ENCOUNTER — Telehealth: Payer: Self-pay | Admitting: *Deleted

## 2019-02-04 VITALS — BP 120/74 | HR 98 | Temp 98.2°F | Ht 61.5 in | Wt 136.0 lb

## 2019-02-04 DIAGNOSIS — R05 Cough: Secondary | ICD-10-CM

## 2019-02-04 DIAGNOSIS — R059 Cough, unspecified: Secondary | ICD-10-CM

## 2019-02-04 MED ORDER — METHYLPREDNISOLONE ACETATE 80 MG/ML IJ SUSP: 80.0000 mg | Freq: Once | INTRAMUSCULAR | Status: DC

## 2019-02-04 MED ORDER — AZITHROMYCIN 250 MG PO TABS: ORAL_TABLET | ORAL | 0 refills | Status: DC

## 2019-02-04 MED ORDER — METHYLPREDNISOLONE ACETATE 80 MG/ML IJ SUSP
80.0000 mg | Freq: Once | INTRAMUSCULAR | Status: AC
  Administered 2019-02-04: 80 mg via INTRAMUSCULAR

## 2019-02-04 MED ORDER — IPRATROPIUM BROMIDE 0.02 % IN SOLN: 0.5000 mg | Freq: Four times a day (QID) | RESPIRATORY_TRACT | 3 refills | Status: DC

## 2019-02-04 NOTE — Patient Instructions (Signed)
It was great to see you!  You have a viral upper respiratory infection. Antibiotics are not needed for this.  Viral infections usually take 7-10 days to resolve.  The cough can last a few weeks to go away.  I have sent the antibiotic and nebulizer solution.  Push fluids and get plenty of rest. Please return if you are not improving as expected, or if you have high fevers (>101.5) or difficulty swallowing or worsening productive cough.  Call clinic with questions.  I hope you start feeling better soon!

## 2019-02-04 NOTE — Telephone Encounter (Signed)
Questions for Screening COVID-19  Symptom onset: Pt c/o bilateral ear pain started yesterday, slight cough for several weeks off and on.  Travel or Contacts: No  During this illness, did/does the patient experience any of the following symptoms? Fever >100.93F []   Yes [x]   No []   Unknown Subjective fever (felt feverish) []   Yes [x]   No []   Unknown Chills []   Yes [x]   No []   Unknown Muscle aches (myalgia) []   Yes [x]   No []   Unknown Runny nose (rhinorrhea) []   Yes [x]   No []   Unknown Sore throat [x]   Yes []   No []   Unknown Cough (new onset or worsening of chronic cough) []   Yes [x]   No []   Unknown Shortness of breath (dyspnea) []   Yes []   No []   Unknown Nausea or vomiting []   Yes [x]   No []   Unknown Headache []   Yes [x]   No []   Unknown Abdominal pain  []   Yes [x]   No []   Unknown Diarrhea (?3 loose/looser than normal stools/24hr period) []   Yes [x]   No []   Unknown Other, specify:_____________________________________________   Patient risk factors: Smoker? []   Current []   Former [x]   Never If female, currently pregnant? []   Yes [x]   No  Patient Active Problem List   Diagnosis Date Noted  . Recurrent isolated sleep paralysis 07/02/2018  . Encounter for medication management 07/02/2018  . Menstrual migraine without status migrainosus, not intractable 01/02/2018  . Chronic thumb pain, bilateral 11/23/2017  . Elevated TSH 05/07/2017  . Trigger thumb, left thumb 03/15/2017  . Chronic post-traumatic stress disorder (PTSD) 02/26/2017  . Sensorineural hearing loss (SNHL) of both ears 12/12/2016  . Asthma 10/27/2016  . GERD (gastroesophageal reflux disease) 10/27/2016  . Seasonal allergic rhinitis 05/02/2016  . Right ear pain 05/02/2016  . Dysfunction of both eustachian tubes 05/02/2016  . Narcolepsy cataplexy syndrome 10/19/2015  . Intractable migraine with aura with status migrainosus 10/19/2015  . Narcolepsy and cataplexy 03/06/2014  . Bipolar II disorder ([place]) 12/31/2013  .  Chondromalacia of knee 03/07/2013    Plan:  []   High risk for COVID-19 with red flags go to ED (with CP, SOB, weak/lightheaded, or fever > 101.5). Call ahead.  []   High risk for COVID-19 but stable will have car visit. Inform provider and coordinate time. Will be completed in afternoon. [x]   No red flags but URI signs or symptoms will go through side door and be seen in dedicated room.  Note: Referral to telemedicine is an appropriate alternative disposition for higher risk but stable. [facility]/e-Visit: [phone].

## 2019-02-04 NOTE — Progress Notes (Signed)
Debra Nelson is a 38 y.o. female here for a new problem.   History of Present Illness:   Chief Complaint  Patient presents with  . Acute Visit    HPI  Slight cough. Bilateral ear pain and itchiness. Slightly sore throat, started yesterday. No fevers, chills. Has history of asthma. History of bronchitis. Good appetite. Kids at home are not sick.   Past Medical History:  Diagnosis Date  . Asthma   . Bipolar disorder with depression ([place])    and PTSD  . Chicken pox   . Deviated septum   . DJD (degenerative joint disease)    Severe of the right knee-Dr Creighton-ortho and Dr Gean Birchwood, PA-preferred pain managment  . GERD (gastroesophageal reflux disease)   . Seasonal allergic rhinitis      Social History   Socioeconomic History  . Marital status: Married    Spouse name: [name]  . Number of children: 4  . Years of education: College  . Highest education level: Not on file  Occupational History    Employer: OTHER  Social Needs  . Financial resource strain: Not on file  . Food insecurity:    Worry: Not on file    Inability: Not on file  . Transportation needs:    Medical: Not on file    Non-medical: Not on file  Tobacco Use  . Smoking status: Never Smoker  . Smokeless tobacco: Never Used  Substance and Sexual Activity  . Alcohol use: No  . Drug use: No  . Sexual activity: Yes    Partners: Male    Birth control/protection: Pill  Lifestyle  . Physical activity:    Days per week: Not on file    Minutes per session: Not on file  . Stress: Not on file  Relationships  . Social connections:    Talks on phone: Not on file    Gets together: Not on file    Attends religious service: Not on file    Active member of club or organization: Not on file    Attends meetings of clubs or organizations: Not on file    Relationship status: Not on file  . Intimate partner violence:    Fear of current or ex partner: Not on file    Emotionally abused: Not on file     Physically abused: Not on file    Forced sexual activity: Not on file  Other Topics Concern  . Not on file  Social History Narrative   Patient is married ([name]) and lives at home with her family   4 adults - 2 kids 50% of the time (1 son, 1 daughter). 2 older children (one age 72 from rape, then 75 year old- does not get to see- related to bipolar) Moved in with in laws to help them      Patient has 4 children (3 of her own and 1 step son).    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   Patient is right-handed.    Past Surgical History:  Procedure Laterality Date  . knee surgery x5     08,11, 12, 13 x2- R knee  . right foot surgery     arthritis/bone spurus. plate placed.   . WISDOM TOOTH EXTRACTION      Family History  Problem Relation Age of Onset  . Mental illness Mother  does not speak to regularly so does not know full history  . Other Father        medical issues, divorce related  . Other Brother        ptsd- fully discharged medically disabled before age 51  . Cancer Paternal Grandfather     Allergies  Allergen Reactions  . Other Other (See Comments)  . Phenergan [Promethazine Hcl]     IV causes muscle tics, may take oral, or give benadryl prior to IV  . Promethazine Other (See Comments)    She cannot take IV phenergan unless this it is given with Benadryl.  . Promethazine Hcl Other (See Comments)    IV causes muscle tics, may take oral, or give benadryl prior to IV  . Topamax [Topiramate] Nausea And Vomiting    confusion  . Klonopin [Clonazepam] Nausea Only    Current Medications:   Current Outpatient Medications:  .  albuterol (2.5 MG/3ML) 0.083% NEBU 3 mL, albuterol (5 MG/ML) 0.5% NEBU 0.5 mL, Inhale into the lungs daily as needed., Disp: , Rfl:  .  albuterol (PROAIR HFA) 108 (90 Base) MCG/ACT inhaler, Inhale 2 puffs into the lungs every 6 (six)  hours as needed for wheezing or shortness of breath., Disp: , Rfl:  .  ALPRAZolam (XANAX) 0.5 MG tablet, Take 0.5 mg by mouth every morning. , Disp: , Rfl:  .  Armodafinil 150 MG tablet, TAKE ONE TABLET BY MOUTH DAILY, Disp: 30 tablet, Rfl: 4 .  busPIRone (BUSPAR) 30 MG tablet, 30 mg 2 (two) times daily., Disp: , Rfl:  .  EPIPEN 2-PAK 0.3 MG/0.3ML SOAJ injection, as needed., Disp: , Rfl:  .  lamoTRIgine (LAMICTAL) 150 MG tablet, Take 150 mg by mouth 2 (two) times daily. , Disp: , Rfl:  .  levocetirizine (XYZAL) 5 MG tablet, Take 5 mg by mouth every evening., Disp: , Rfl:  .  lithium carbonate (ESKALITH) 450 MG CR tablet, Take 1 tablet (450 mg total) by mouth daily., Disp: 90 tablet, Rfl: 0 .  montelukast (SINGULAIR) 10 MG tablet, Take 10 mg by mouth at bedtime., Disp: , Rfl:  .  Multiple Vitamins-Minerals (HAIR SKIN AND NAILS FORMULA PO), Take 1 tablet by mouth daily. , Disp: , Rfl:  .  Multiple Vitamins-Minerals (MULTIVITAMIN WITH MINERALS) tablet, Take 1 tablet by mouth daily., Disp: , Rfl:  .  promethazine (PHENERGAN) 25 MG tablet, TAKE ONE TABLET BY MOUTH EVERY 6 HOURS AS NEEDED FOR NAUSEA AND VOMITING, Disp: 30 tablet, Rfl: 8 .  Sodium Oxybate 500 MG/ML SOLN, Take 3.5gms at bedtime. Take 4.5gms 2 hours later. Total of 8gms per night. Dispense 377ml., Disp: 360 mL, Rfl: 1 .  SUMAtriptan (IMITREX) 100 MG tablet, TAKE 1/2 TO 1 TABLET BY MOUTH EVERY 2 HOURS AS NEEDED FOR MIGRAINE, MAY REPEAT IN 2 HOURS IF HEADACHE RECURS., Disp: 10 tablet, Rfl: 1 .  azithromycin (ZITHROMAX) 250 MG tablet, Take two tablets on day 1, then one tablet daily x 4 days, Disp: 6 tablet, Rfl: 0 .  ipratropium (ATROVENT) 0.02 % nebulizer solution, Take 2.5 mLs (0.5 mg total) by nebulization 4 (four) times daily., Disp: 75 mL, Rfl: 3  Current Facility-Administered Medications:  .  diclofenac sodium (VOLTAREN) 1 % transdermal gel 2 g, 2 g, Topical, QID PRN, Aundra Dubin, PA-C   Review of Systems:   Review of Systems   Constitutional: Negative for chills, fever, malaise/fatigue and weight loss.  HENT: Negative for congestion, ear discharge, hearing loss and sore throat.  Respiratory: Negative for shortness of breath.   Cardiovascular: Negative for chest pain, orthopnea, claudication and leg swelling.  Gastrointestinal: Negative for heartburn, nausea and vomiting.  Neurological: Negative for dizziness, tingling and headaches.    Vitals:   Vitals:   02/04/19 1458  BP: 120/74  Pulse: 98  Temp: 98.2 F (36.8 C)  TempSrc: Oral  SpO2: 99%  Weight: 136 lb (61.7 kg)  Height: 5' 1.5" (1.562 m)     Body mass index is 25.28 kg/m.  Physical Exam:   Physical Exam Vitals signs and nursing note reviewed.  Constitutional:      General: She is not in acute distress.    Appearance: She is well-developed. She is not ill-appearing or toxic-appearing.  HENT:     Head: Normocephalic and atraumatic.     Right Ear: Tympanic membrane, ear canal and external ear normal. Tympanic membrane is not erythematous, retracted or bulging.     Left Ear: Tympanic membrane, ear canal and external ear normal. Tympanic membrane is not erythematous, retracted or bulging.     Nose: Mucosal edema, congestion and rhinorrhea present.     Right Sinus: No maxillary sinus tenderness or frontal sinus tenderness.     Left Sinus: No maxillary sinus tenderness or frontal sinus tenderness.     Mouth/Throat:     Lips: Pink.     Mouth: Mucous membranes are moist.     Pharynx: Uvula midline. Posterior oropharyngeal erythema present.     Tonsils: No tonsillar exudate. Swelling: 1+ on the right. 1+ on the left.  Eyes:     General: Lids are normal.     Conjunctiva/sclera: Conjunctivae normal.  Neck:     Trachea: Trachea normal.  Cardiovascular:     Rate and Rhythm: Normal rate and regular rhythm.     Heart sounds: Normal heart sounds, S1 normal and S2 normal.  Pulmonary:     Effort: Pulmonary effort is normal.     Breath sounds:  Normal breath sounds. No decreased breath sounds, wheezing, rhonchi or rales.  Lymphadenopathy:     Cervical: No cervical adenopathy.  Skin:    General: Skin is warm and dry.  Neurological:     Mental Status: She is alert.  Psychiatric:        Speech: Speech normal.        Behavior: Behavior normal. Behavior is cooperative.       Assessment and Plan:   Miliyah was seen today for acute visit.  Diagnoses and all orders for this visit:  Cough -     methylPREDNISolone acetate (DEPO-MEDROL) injection 80 mg  Other orders -     azithromycin (ZITHROMAX) 250 MG tablet; Take two tablets on day 1, then one tablet daily x 4 days -     ipratropium (ATROVENT) 0.02 % nebulizer solution; Take 2.5 mLs (0.5 mg total) by nebulization 4 (four) times daily.   No red flags on exam. Suspect early bronchitis. Received depo-medrol injection in the office and tolerated well. Start azithromycin antibiotic if symptoms persist/worsen.  Reviewed return precautions including worsening fever, SOB, worsening cough or other concerns. Push fluids and rest. I recommend that patient follow-up if symptoms worsen or persist despite treatment x 7-10 days, sooner if needed.   . Reviewed expectations re: course of current medical issues. . Discussed self-management of symptoms. . Outlined signs and symptoms indicating need for more acute intervention. . Patient verbalized understanding and all questions were answered. . See orders for this visit as documented in the  electronic medical record. . Patient received an After-Visit Summary.   Inda Coke, PA-C

## 2019-05-01 ENCOUNTER — Other Ambulatory Visit: Payer: Self-pay | Admitting: Neurology

## 2019-05-09 ENCOUNTER — Encounter: Payer: Self-pay | Admitting: Family Medicine

## 2019-05-09 ENCOUNTER — Ambulatory Visit: Payer: BC Managed Care – PPO | Admitting: Family Medicine

## 2019-05-09 ENCOUNTER — Ambulatory Visit (INDEPENDENT_AMBULATORY_CARE_PROVIDER_SITE_OTHER): Payer: BC Managed Care – PPO | Admitting: Family Medicine

## 2019-05-09 DIAGNOSIS — J329 Chronic sinusitis, unspecified: Secondary | ICD-10-CM

## 2019-05-09 MED ORDER — PREDNISONE 50 MG PO TABS: ORAL_TABLET | ORAL | 0 refills | Status: DC

## 2019-05-09 MED ORDER — AMOXICILLIN-POT CLAVULANATE 875-125 MG PO TABS: 1.0000 | ORAL_TABLET | Freq: Two times a day (BID) | ORAL | 0 refills | Status: DC

## 2019-05-09 MED ORDER — GUAIFENESIN-CODEINE 100-10 MG/5ML PO SOLN: 5.0000 mL | Freq: Three times a day (TID) | ORAL | 0 refills | Status: DC | PRN

## 2019-05-09 NOTE — Progress Notes (Signed)
    Chief Complaint:  Debra Nelson is a 38 y.o. female who presents today for a virtual office visit with a chief complaint of sinus congestion.   Assessment/Plan:  Sinusitis  No red flags.  Offered testing for COVID however patient declined.  Start Augmentin given the symptoms been over a week and are worsening.  Also start prednisone for her mild asthma exacerbation.  This will also help some of her sinus congestion as well.  Sent in prescription for guaifenesin-codeine cough syrup as well.  Encouraged good oral hydration.  Discussed reasons to return to care.  Follow-up as needed.    Subjective:  HPI:  Sinus Congestion Started about a week ago.  Worsened over that time.Associated with drainage, sore throat pain, right ear pain, some tonsillar swelling. Symptoms seem to be worsening. Tried taking tylenol and advil with modest improvement. Some elevated temperature readings to 100F. No known sick contacts.  No body aches.  She has had a little bit of worsening cough and wheeze that she thinks is her asthma flaring up.  No shortness of breath.  ROS: Per HPI  PMH: She reports that she has never smoked. She has never used smokeless tobacco. She reports that she does not drink alcohol or use drugs.      Objective/Observations  Physical Exam: Gen: NAD, resting comfortably HEENT: Bilateral tonsillar hypertrophy and erythema noted. Pulm: Normal work of breathing Neuro: Grossly normal, moves all extremities Psych: Normal affect and thought content  Virtual Visit via Video   I connected with Debra Nelson on 05/09/19 at  1:00 PM EDT by a video enabled telemedicine application and verified that I am speaking with the correct person using two identifiers. I discussed the limitations of evaluation and management by telemedicine and the availability of in person appointments. The patient expressed understanding and agreed to proceed.   Patient location: Home Provider location: [place] participating in the virtual visit: Myself and Patient     Algis Greenhouse. Jerline Pain, MD 05/09/2019 1:03 PM

## 2019-05-22 ENCOUNTER — Encounter: Payer: Self-pay | Admitting: Podiatry

## 2019-05-22 ENCOUNTER — Other Ambulatory Visit: Payer: Self-pay

## 2019-05-22 ENCOUNTER — Ambulatory Visit (INDEPENDENT_AMBULATORY_CARE_PROVIDER_SITE_OTHER): Payer: BC Managed Care – PPO | Admitting: Podiatry

## 2019-05-22 ENCOUNTER — Other Ambulatory Visit: Payer: Self-pay | Admitting: Podiatry

## 2019-05-22 ENCOUNTER — Ambulatory Visit (INDEPENDENT_AMBULATORY_CARE_PROVIDER_SITE_OTHER): Payer: BC Managed Care – PPO

## 2019-05-22 VITALS — Temp 98.4°F

## 2019-05-22 DIAGNOSIS — M779 Enthesopathy, unspecified: Secondary | ICD-10-CM

## 2019-05-22 DIAGNOSIS — M79671 Pain in right foot: Secondary | ICD-10-CM

## 2019-05-22 DIAGNOSIS — M7751 Other enthesopathy of right foot: Secondary | ICD-10-CM | POA: Diagnosis not present

## 2019-05-22 NOTE — Progress Notes (Signed)
Subjective:   Patient ID: Debra Nelson, female   DOB: 38 y.o.   MRN: [id number]   HPI Patient presents stating she had orthopedic surgery 3 years ago to fuse her big toe right and it is giving her trouble and she had the fixation removed about 9 months ago and over the last few months it is been more bothersome.  Patient states that she does not smoke and likes to be active   Review of Systems  All other systems reviewed and are negative.       Objective:  Physical Exam Vitals signs and nursing note reviewed.  Constitutional:      Appearance: She is well-developed.  Pulmonary:     Effort: Pulmonary effort is normal.  Musculoskeletal: Normal range of motion.  Skin:    General: Skin is warm.  Neurological:     Mental Status: She is alert.     Neurovascular status intact muscle strength adequate range of motion subtalar midtarsal joint within normal limits.  Patient's big toe joint right does not appear to have motion but it is sore on the lateral side of the joint and there is moderate inflammation around the area.  There is previous incision from surgery which was done approximately 3 years ago with pin removal and plate removal more recently     Assessment:  Possibility for nonhealing of the lateral side of the joint right secondary to fusion first metatarsal phalangeal joint     Plan:  H&P education rendered to patient.  I will get a try to reduce her symptoms conservatively but ultimately this may require either re-fusion or possible cutting bone out and implantation procedure which I do think could be done for her.  Today I did do a careful lateral injection 3 mg Kenalog 5 mg Xylocaine lateral side of the joint and discussed the possibility for a new orthotic depending on the relief of symptoms with this procedure  X-rays indicate that there is some what appears to be opening of the joint on the right lateral side secondary to the fusion with an elongated first metatarsal  segment noted

## 2019-06-11 ENCOUNTER — Ambulatory Visit: Payer: BC Managed Care – PPO | Admitting: Podiatry

## 2019-06-23 ENCOUNTER — Ambulatory Visit: Payer: BC Managed Care – PPO | Admitting: Podiatry

## 2019-06-25 ENCOUNTER — Encounter: Payer: Self-pay | Admitting: Podiatry

## 2019-06-25 ENCOUNTER — Ambulatory Visit (INDEPENDENT_AMBULATORY_CARE_PROVIDER_SITE_OTHER): Payer: BC Managed Care – PPO | Admitting: Podiatry

## 2019-06-25 ENCOUNTER — Other Ambulatory Visit: Payer: Self-pay

## 2019-06-25 VITALS — Temp 98.6°F

## 2019-06-25 DIAGNOSIS — M79671 Pain in right foot: Secondary | ICD-10-CM

## 2019-06-25 DIAGNOSIS — M2021 Hallux rigidus, right foot: Secondary | ICD-10-CM | POA: Diagnosis not present

## 2019-06-25 DIAGNOSIS — M779 Enthesopathy, unspecified: Secondary | ICD-10-CM | POA: Diagnosis not present

## 2019-06-25 NOTE — Progress Notes (Signed)
Subjective:   Patient ID: Debra Nelson, female   DOB: 38 y.o.   MRN: [id number]   HPI Patient presents stating she only had short-term relief with the injection that we did and she is having a lot of pain in her big toe joint and she is can have to have something done with it   ROS      Objective:  Physical Exam  Neurovascular status unchanged with patient found to have inflammation pain of the right first MPJ with a history of fusion of the joint several years ago which is still giving him problems     Assessment:  Probability for nonunion of the first MPJ right secondary to fusion of a number of years ago with commensurate pain     Plan:  H&P spent a great deal time going over this with her discussing options.  I do think that there is no real good options for her but I think given her amount of pain at 38 years old she deserves the chance for improvement.  I do think a implant would be her best option and again I discussed this with other docs in the group for consideration and also we may need to get CT scan or other treatment but at this point refusing it would be difficult

## 2019-07-01 ENCOUNTER — Other Ambulatory Visit: Payer: Self-pay | Admitting: Neurology

## 2019-07-01 MED ORDER — SODIUM OXYBATE 500 MG/ML PO SOLN: ORAL | 5 refills | Status: DC

## 2019-07-02 ENCOUNTER — Encounter: Payer: Self-pay | Admitting: Podiatry

## 2019-07-02 ENCOUNTER — Ambulatory Visit (INDEPENDENT_AMBULATORY_CARE_PROVIDER_SITE_OTHER): Payer: BC Managed Care – PPO | Admitting: Podiatry

## 2019-07-02 ENCOUNTER — Other Ambulatory Visit: Payer: Self-pay

## 2019-07-02 VITALS — Temp 98.6°F

## 2019-07-02 DIAGNOSIS — M2021 Hallux rigidus, right foot: Secondary | ICD-10-CM | POA: Diagnosis not present

## 2019-07-02 NOTE — Progress Notes (Signed)
Subjective:   Patient ID: Debra Nelson, female   DOB: 38 y.o.   MRN: [id number]   HPI Patient presents for consult concerning severe pain in her right first metatarsal with history of fusion which has not taken which is creating chronic pain   ROS      Objective:  Physical Exam  Neurovascular status intact with inflammation pain of the first metatarsal phalangeal joint which was fused and does not have significant clinical motion but is very painful with indications of probable nonunion     Assessment:  Chronic pain of the first metatarsal hallux joint with probable nonunion from surgery that was done approximately 4 years ago     Plan:  H&P condition discussed at great length and at this point I did discuss cutting out the joint and the damage bone and then replacing with implant and reviewed this at great length.  I did discuss that this is a difficult procedure and that is revisional surgery and there is absolutely no long-term guarantees and she could go on later on to requiring fusion or reimplantation.  She wants to pursue this route due to the chronic pain in the amount of discomfort she is having and she is scheduled for outpatient surgery after reviewing alternative treatments complications and her signing consent form.  I did dispense air fracture walker with all instructions on usage and patient is encouraged to call with any questions concerns prior to procedure and understands total recovery will take 6 months to 1 year

## 2019-07-02 NOTE — Patient Instructions (Signed)
Pre-Operative Instructions  Congratulations, you have decided to take an important step towards improving your quality of life.  You can be assured that the doctors and staff at [facility] will be with you every step of the way.  Here are some important things you should know:  1. Plan to be at the surgery center/hospital at least 1 (one) hour prior to your scheduled time, unless otherwise directed by the surgical center/hospital staff.  You must have a responsible adult accompany you, remain during the surgery and drive you home.  Make sure you have directions to the surgical center/hospital to ensure you arrive on time. 2. If you are having surgery at [facility] or [facility] hospitals, you will need a copy of your medical history and physical form from your family physician within one month prior to the date of surgery. We will give you a form for your primary physician to complete.  3. We make every effort to accommodate the date you request for surgery.  However, there are times where surgery dates or times have to be moved.  We will contact you as soon as possible if a change in schedule is required.   4. No aspirin/ibuprofen for one week before surgery.  If you are on aspirin, any non-steroidal anti-inflammatory medications (Mobic, Aleve, Ibuprofen) should not be taken seven (7) days prior to your surgery.  You make take Tylenol for pain prior to surgery.  5. Medications - If you are taking daily heart and blood pressure medications, seizure, reflux, allergy, asthma, anxiety, pain or diabetes medications, make sure you notify the surgery center/hospital before the day of surgery so they can tell you which medications you should take or avoid the day of surgery. 6. No food or drink after midnight the night before surgery unless directed otherwise by surgical center/hospital staff. 7. No alcoholic beverages 24-hours prior to surgery.  No smoking 24-hours prior or 24-hours after  surgery. 8. Wear loose pants or shorts. They should be loose enough to fit over bandages, boots, and casts. 9. Don't wear slip-on shoes. Sneakers are preferred. 10. Bring your boot with you to the surgery center/hospital.  Also bring crutches or a walker if your physician has prescribed it for you.  If you do not have this equipment, it will be provided for you after surgery. 11. If you have not been contacted by the surgery center/hospital by the day before your surgery, call to confirm the date and time of your surgery. 12. Leave-time from work may vary depending on the type of surgery you have.  Appropriate arrangements should be made prior to surgery with your employer. 13. Prescriptions will be provided immediately following surgery by your doctor.  Fill these as soon as possible after surgery and take the medication as directed. Pain medications will not be refilled on weekends and must be approved by the doctor. 14. Remove nail polish on the operative foot and avoid getting pedicures prior to surgery. 15. Wash the night before surgery.  The night before surgery wash the foot and leg well with water and the antibacterial soap provided. Be sure to pay special attention to beneath the toenails and in between the toes.  Wash for at least three (3) minutes. Rinse thoroughly with water and dry well with a towel.  Perform this wash unless told not to do so by your physician.  Enclosed: 1 Ice pack (please put in freezer the night before surgery)   1 Hibiclens skin cleaner     Pre-op instructions  If you have any questions regarding the instructions, please do not hesitate to call our office.  [place]: [address], [place], [place] [address] -- [phone]  [place]: [address] [place], [place] [address] -- [phone]  [place]: [address]  [place], [place] [address] -- [phone]  [place]: [address], [address], [place], [place] [address] -- [phone]  Website:  [email or web address] 

## 2019-07-10 ENCOUNTER — Ambulatory Visit (INDEPENDENT_AMBULATORY_CARE_PROVIDER_SITE_OTHER): Payer: BC Managed Care – PPO | Admitting: Neurology

## 2019-07-10 ENCOUNTER — Other Ambulatory Visit: Payer: Self-pay

## 2019-07-10 ENCOUNTER — Encounter: Payer: Self-pay | Admitting: Neurology

## 2019-07-10 VITALS — BP 117/82 | HR 94 | Temp 97.8°F | Ht 61.5 in | Wt 152.0 lb

## 2019-07-10 DIAGNOSIS — F3181 Bipolar II disorder: Secondary | ICD-10-CM

## 2019-07-10 DIAGNOSIS — G43829 Menstrual migraine, not intractable, without status migrainosus: Secondary | ICD-10-CM | POA: Diagnosis not present

## 2019-07-10 DIAGNOSIS — G4753 Recurrent isolated sleep paralysis: Secondary | ICD-10-CM | POA: Diagnosis not present

## 2019-07-10 DIAGNOSIS — G47411 Narcolepsy with cataplexy: Secondary | ICD-10-CM | POA: Diagnosis not present

## 2019-07-10 MED ORDER — SODIUM OXYBATE 500 MG/ML PO SOLN: ORAL | 5 refills | Status: DC

## 2019-07-10 MED ORDER — ARMODAFINIL 150 MG PO TABS: 150.0000 mg | ORAL_TABLET | Freq: Every day | ORAL | 4 refills | Status: DC

## 2019-07-10 NOTE — Patient Instructions (Signed)
As discussed, Xyrem has to be taken with very mindful caution: Taking Xyrem correctly is key. This means, take it only when you are fully ready to fall asleep, while in bed and refrain from doing any other activities, even brushing  your teeth after taking your first dose. The second dose will be about 2-1/2-4 hours after his first dose. You can go to the bathroom before your 2nd dose. Take your first dose, when actually IN BED, ready to sleep. No sitting up in bed, NO reading, NO using the cell phone or computer, NO getting up to use the bathroom. Take care of everything BEFORE sleep time. Try NOT to skip the second dose as the Xyrem is not going to stay in your system long enough with only one dose. Do not drink alcohol with Xyrem. If you do drink Alcohol, you cannot take your Xyrem doses that night.   

## 2019-07-10 NOTE — Progress Notes (Signed)
[facility]   Provider:  Larey Seat, MD   Referring Provider/ Primary Care Physician:  Marin Olp, MD   Cc Chucky May, MD  Chief Complaint  Patient presents with  . Follow-up    pt with husband , rm 10     RV : 07-10-2019,I have the pleasure of seeing Debra Nelson in the presence of her husband here today , she is usually getting a good nights rest. Epworth is 8/ 24 and FSS endorsed at 34/63 point.  Migraine have been infrequent.  Blood work and refills today.  Depression : symptoms denied.     RV 01-02-2019, meeting for refills.  Xyrem user at  3.5 gram for first dose and 4.5 second dose. She is today suffering form a GI infection and last night was affected, sleep reportedly poor. She lost weight. She quit Sodas- and the sugar intake reduction helped with fatigue. She hydrates more with water, and she goes to the bathroom more often.    XYREM and NUVIGIL controlled her hypersomnia and lithium, Buspar and Lamictal controlled her anxiety/ Bipolar disorder.    XYREM has alleviated her cataplexy - No need for tofranil.  Increased XYREM 2 month ago to 3.5 gram first dose and 4.5 g second dose nightly.  Parasomnia/ sleep walking activity on XYREM has ceased after the dose was reduced from 4 to 3 gram twice at night but Xyrem was less effective.  She reports now not having parasomnia activity in spite of higher dosing.   I have the pleasure of seeing Debra Nelson in the presence of her husband here today on 02 July 2018. She is a longtime established narcolepsy patient in our practice who is taking Xyrem.  She has overall achieved very good control of hypersomnia but we had to find the right dose for her liquid nightly trigger sleepwalking episodes nor allow her to resume her nightmarish dreams.  For many months she did well at 3 g twice at night, however the dreams have now returned in the early morning hours following the second dose by probably 2 hours also.   We could increase the second Xyrem dose to 3.5 g, and may be divided the 2 doses by a longer interval.  Of 2-1/2 hours.  This may cover the second half of the night better, but it puts her at risk of daytime sleepiness if the Xyrem is still in her system.  Usually she works at 7.30 AM . This allows the intake time to be 1.30 AM, the first dose around 22.00 hours.     Debra Nelson is a 38 y.o. female , seen here as a revisit from Dr. Yong Channel for treatment of hypersomnia, HLA positive , diagnosed with Narcolepsy. Debra Nelson has been titrated to Xyrem which has given her final several years of good control of hypersomnia.  Originally I had titrated her to the maximum dose of 4.5 g twice at night but she began to sleep walk and had other parasomnia activity, the dose was reduced to 3 g twice at night under which she did well for the last 18 months.  She now reports that she is not sleeping nearly as deep, she has breakthrough dream activity and her husband confirms that it is easier to wake her than it used to be.  Since she has room to further increase the dose we can go from here to at least 3.5 g twice at night and see if further adjustments are necessary.  I would like this process to go slow as not to reintroduce sleepwalking. The fatigue severity score was endorsed at 42 points and the Epworth sleepiness score at 8 points.   This is still a great effect in comparison to the untreated Epworth sleepiness score.   Today is 07/02/2017, And as the pleasure of seeing Debra Nelson in the presence of her young daughter. The patient endorses only 3 points on her Epworth Sleepiness Scale today, 22% on her fatigue score and is doing very well in terms of control of sleepiness, cataplexy and narcolepsy. She has lost weight. She finally got off Soda. She is not employed right now.  Her husband was retired from the police due to DDD.    08-31-2016  Debra Nelson is a 38 y.o. female , seen here as a  revisit from Dr. Yong Channel for treatment of hypersomnia, HLA positive , diagnosed with Narcolepsy and cataplexy . The patient developed sleepwalking episodes on Xyrem, but she could not find control of her narcolepsy and cataplexy without the medication. For this reason she has restarted apnea monitoring her closely. Her fatigue severity scale today is 47, Epworth sleepiness score is 6 points on Xyrem with armodafinil . The [employer] has now been again covered by her insurance not longer on Tofranil-.Mood overall is good, less variability, not euphoric.Not suicidal - no intent , no plan.   Debra Nelson reports morning headaches frequently now, my concern is that Xyrem may induce hypoventilation that she could be a CO2 retainer. 3 gram of Xyrem has controlled sleepiness and not allowed sleepwaling, but still has vivid dreams.   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  She has started to sleep walk almost nightly and severely. The discussion as to discontinue the Xyrem and rely modafinil only- but that would not affect her cataplexy. Currently she has not taken Xyrem for 12 days but is not sleep walking, but her cataplexy is poorly controlled. I would like very much for her to go back on Xyrem but it has to be in a safe environment for she can be watched not vice versa. I will give her Tofranil today to control the cataplexy as long as she is not able to go back on Xyrem and I would refill modafinil.-Nuvigil. This helped a lot in day time. ( Due to parasomnia activity Xyrem is currently on hold. Her husband needs to be back on his feet before we can stop the medication again I also will give her Tofranil to help control cataplexy be continued Nuvigil in daytime. They can change the dosing of Xyrem to the first dose being given at 4.5 g at night and the second dose to be 2  or even 3 g less. Sometimes this will help to prevent parasomnias in the morning hours. I would also like for her to have a motion detector at the door of the bedroom so that she may wake up or others are alerted to her activity. I would like to recommend her to lock all doors and especially keeps a car keys in a pocket or a drawer but they are not visible)  Chief complaint according to patient :" Recurrent hypersomnia "after the patient's medications were no longer approved by insurance.  She was changed from Nuvigil to Provigil and has not responded well.The patient is an HLA  positive narcoleptic with co-morbidities of  Bipolar disorder. This limits the use of stimulants in her treatment.  She was changed from [place] to [place] by Psychiatry, and her anxiety level was lower than it had been in a long time.The patient felt that her psychiatric condition has been very well controlled with the new LATUDA  Medication- her anxiety level decreased she was much less irritable. Shortly after that  the medication Nuvigil was not longer covered and she is now sleepier and less attentive.  Sleep studies : see media. The patient has been on Xyrem, and lost weight in the process.   Interval 10-19-15, Debra Nelson reports that she had a good, but very busy Thanksgiving and that she has been the main caretaker of her husband , who is recovering from her surgery. Overall she has lost more weight since being on Xyrem. Her insurance stopped covering the Nuvigil as I had reported at her last visit she was changed from Nuvigil to Provigil but had not responded as well and especially not to the generic form. She has noted that it takes her longer to fall asleep on Xyrem.  She needs to get back on Nuvigil for daytime sleepiness. Propranolol seems to work less for headache prophylaxis. The recurrence of headaches can also be related to the discontinuation of Nuvigil.    Review of Systems: Out of a complete 14 system review,  the patient complains of only the following symptoms, and all other reviewed systems are negative. Fatigue severity score  Again 28 from 41 (Xyrem)  from 53, before xyrem  Epworth Sleepiness score endorsed at 07-02-2018  2/ 24 , dreams are coming back before she wakes up. Nightmarish-  She is not sleep walking - she was at 4.5 gram at the time. She dropped the dose to first 3.0 gram and the second to 3.0 - will now go to 3.5 at sec dose.    depression score PHQ 9 :  How likely are you to doze in the following situations: 0 = not likely, 1 = slight chance, 2 = moderate chance, 3 = high chance  Sitting and Reading? 1 Watching Television?1 Sitting inactive in a public place (theater or meeting)?1 As a passenger in a car for an hour without a break? 1 Lying down in the afternoon when circumstances permit? 2 Sitting and talking to someone?0 Sitting quietly after lunch without alcohol? 1 In a car, while stopped for a few minutes in traffic?0   Total =7 on Xyrem , 01-02-2019   Recurrent dreams, no cataplexy.  Snoring  Memory loss. multitasking difficulties.   Office Visit from 04/07/2015 in [facility]  PHQ-9 Total Score  14        Social History   Socioeconomic History  . Marital status: Married    Spouse name: [name]  . Number of children: 4  . Years of education: College  . Highest education level: Not on file  Occupational History    Employer: OTHER  Social Needs  . Financial resource strain: Not on file  . Food insecurity    Worry: Not on file    Inability: Not on file  . Transportation needs    Medical: Not on file    Non-medical: Not on file  Tobacco Use  . Smoking status: Never Smoker  . Smokeless tobacco: Never Used  Substance and Sexual Activity  . Alcohol use: No  . Drug use: No  . Sexual activity: Yes    Partners: Male  Birth control/protection: Pill  Lifestyle  . Physical activity    Days per week: Not on file    Minutes per  session: Not on file  . Stress: Not on file  Relationships  . Social Herbalist on phone: Not on file    Gets together: Not on file    Attends religious service: Not on file    Active member of club or organization: Not on file    Attends meetings of clubs or organizations: Not on file    Relationship status: Not on file  . Intimate partner violence    Fear of current or ex partner: Not on file    Emotionally abused: Not on file    Physically abused: Not on file    Forced sexual activity: Not on file  Other Topics Concern  . Not on file  Social History Narrative   Patient is married ([name]) and lives at home with her family   4 adults - 2 kids 50% of the time (1 son, 1 daughter). 2 older children (one age 68 from rape, then 37 year old- does not get to see- related to bipolar) Moved in with in laws to help them      Patient has 4 children (3 of her own and 1 step son).    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   Patient is right-handed.    Family History  Problem Relation Age of Onset  . Mental illness Mother        does not speak to regularly so does not know full history  . Other Father        medical issues, divorce related  . Other Brother        ptsd- fully discharged medically disabled before age 39  . Cancer Paternal Grandfather     Past Medical History:  Diagnosis Date  . Asthma   . Bipolar disorder with depression ([place])    and PTSD  . Chicken pox   . Deviated septum   . DJD (degenerative joint disease)    Severe of the right knee-Dr Creighton-ortho and Dr Gean Birchwood, PA-preferred pain managment  . GERD (gastroesophageal reflux disease)   . Seasonal allergic rhinitis     Past Surgical History:  Procedure Laterality Date  . knee surgery x5     08,11, 12, 13 x2- R knee  . right foot surgery     arthritis/bone spurus. plate placed.   .  WISDOM TOOTH EXTRACTION      Current Outpatient Medications  Medication Sig Dispense Refill  . albuterol (2.5 MG/3ML) 0.083% NEBU 3 mL, albuterol (5 MG/ML) 0.5% NEBU 0.5 mL Inhale into the lungs daily as needed.    Marland Kitchen albuterol (PROAIR HFA) 108 (90 Base) MCG/ACT inhaler Inhale 2 puffs into the lungs every 6 (six) hours as needed for wheezing or shortness of breath.    . ALPRAZolam (XANAX) 0.5 MG tablet Take 0.5 mg by mouth every morning.     . Armodafinil 150 MG tablet TAKE ONE TABLET BY MOUTH DAILY 30 tablet 4  . busPIRone (BUSPAR) 30 MG tablet 30 mg 2 (two) times daily.    Marland Kitchen EPIPEN 2-PAK 0.3 MG/0.3ML SOAJ injection as needed.    Marland Kitchen ipratropium (ATROVENT) 0.02 % nebulizer solution Take 2.5 mLs (0.5 mg total) by nebulization 4 (four) times daily.  75 mL 3  . lamoTRIgine (LAMICTAL) 150 MG tablet Take 150 mg by mouth 2 (two) times daily.     Marland Kitchen levocetirizine (XYZAL) 5 MG tablet Take 5 mg by mouth every evening.    . lithium carbonate (ESKALITH) 450 MG CR tablet Take 1 tablet (450 mg total) by mouth daily. 90 tablet 0  . montelukast (SINGULAIR) 10 MG tablet Take 10 mg by mouth at bedtime.    . Multiple Vitamins-Minerals (HAIR SKIN AND NAILS FORMULA PO) Take 1 tablet by mouth daily.     . Multiple Vitamins-Minerals (MULTIVITAMIN WITH MINERALS) tablet Take 1 tablet by mouth daily.    . Sodium Oxybate 500 MG/ML SOLN Take 3.5gms at bedtime. Take 4.5gms 2 hours later. Total of 8gms per night. Dispense 318m. 360 mL 5  . SUMAtriptan (IMITREX) 100 MG tablet TAKE ONE-HALF TO ONE TABLET BY MOUTH EVERY 2 HOURS AS NEEDED FOR MIGRAINE, MAY REPEAT IN 2 HOURS IF HEADACHE RECURS 10 tablet 0  . VRAYLAR capsule      Current Facility-Administered Medications  Medication Dose Route Frequency Provider Last Rate Last Dose  . diclofenac sodium (VOLTAREN) 1 % transdermal gel 2 g  2 g Topical QID PRN SDwana MelenaL, PA-C        Allergies as of 07/10/2019 - Review Complete 07/10/2019  Allergen Reaction Noted  .  Other Other (See Comments) 03/07/2013  . Phenergan [promethazine hcl]  12/26/2011  . Promethazine Other (See Comments) 03/07/2013  . Promethazine hcl Other (See Comments) 12/26/2011  . Topamax [topiramate] Nausea And Vomiting 12/19/2013  . Klonopin [clonazepam] Nausea Only 12/19/2013   Vitals: BP 117/82   Pulse 94   Temp 97.8 F (36.6 C)   Ht 5' 1.5" (1.562 m)   Wt 152 lb (68.9 kg)   BMI 28.26 kg/m  Last Weight:  Wt Readings from Last 1 Encounters:  07/10/19 152 lb (68.9 kg)   BBJY:NWGNmass index is 28.26 kg/m.     Last Height:   Ht Readings from Last 1 Encounters:  07/10/19 5' 1.5" (1.562 m)    Physical exam:  General: The patient is awake, alert and appears relaxed.  Head: Normocephalic, atraumatic.   Mallampati 2- crowded lower jaw,  neck circumference:14 inches.  Nasal airflow unrestricted.   TMJ click was not evident . Retrognathia is not seen.  Cardiovascular:  Regular rate and rhythm. Respiratory:  Bronchitic cough,  Skin:  Without evidence of edema, or rash Trunk: BMI is 25 kg/m2  Neurologic exam : The patient is awake and alert, oriented to place and time.    Memory subjective described as intact.   Speech is not pressured, is fluent, without dysarthria, dysphonia. No tremor in her voice.  Mood and affect are appropriate, she is conversant and seems happy. Attention is normal.     Sense of taste and smell are reportedly intact.  Pupils are equal and briskly reactive to light and accomodation   Extraocular movements  in vertical and horizontal planes intact and without nystagmus. Visual fields by finger perimetry are intact.  Facial motor strength is symmetric and tongue and uvula move midline. Shoulder shrug was symmetrical.  Motor: intact strength, no focal weakness, symmetric muscle bulk and tone. Cooordination: no change in penmanship, no tremor.  Sensory: Intact to primary modalities. Normal width of gait and good balance.  DTR: symmetric, brisk, no  clonus. .    Assessment:  After physical and neurologic examination, review of laboratory studies,  Personal review of imaging studies, reports  of other /same  Imaging studies ,   Results of polysomnography/ neurophysiology testing and pre-existing records as far as provided in visit.,   My assessment is: Narcolepsy, in the setting of Bipolar mood disorder.  Patient assured me that her depression is well controlled.   The patient was advised of the nature of the diagnosed Narcolepsy (Narcolepsy confirmed by HLA -) , the treatment with XYREM and risks for general a health and wellness arising from not treating the condition. The patient has also migraines, but infrequently.  She will look into magnetic stimulation therapy for her depression, and was told she may need ECT- which would cause more memory loss/ lasting impact on her mental health.  Her psychiatrist is concerned about hypomania, and started Vraylar.  -  and  We watch and monitor as modafinil can push mania.  There is also a strong menstrual cycle association for mood changes. Sinclair Grooms XYREM at the dose you found most helpful, 3.5 and 4.5 gram at night po.   I spent more than 25 minutes of face to face time with the patient. Greater than 50% of time was spent in counseling and coordination of care. We have discussed the diagnosis and differential and I answered the patient's questions.     PS :  LFT, CMT. lithium test.  CBC diff TSH     Asencion Partridge Katalyna Socarras MD  07/10/2019   CC: Marin Olp, Md [address] [place],  [place] [address]

## 2019-07-11 LAB — TSH+FREE T4
Free T4: 1.26 ng/dL (ref 0.82–1.77)
TSH: 1.6 u[IU]/mL (ref 0.450–4.500)

## 2019-07-11 LAB — COMPREHENSIVE METABOLIC PANEL
ALT: 15 IU/L (ref 0–32)
AST: 17 IU/L (ref 0–40)
Albumin/Globulin Ratio: 2.6 — ABNORMAL HIGH (ref 1.2–2.2)
Albumin: 5.2 g/dL — ABNORMAL HIGH (ref 3.8–4.8)
Alkaline Phosphatase: 91 IU/L (ref 39–117)
BUN/Creatinine Ratio: 8 — ABNORMAL LOW (ref 9–23)
BUN: 8 mg/dL (ref 6–24)
Bilirubin Total: 0.7 mg/dL (ref 0.0–1.2)
CO2: 23 mmol/L (ref 20–29)
Calcium: 10.3 mg/dL — ABNORMAL HIGH (ref 8.7–10.2)
Chloride: 102 mmol/L (ref 96–106)
Creatinine, Ser: 0.96 mg/dL (ref 0.57–1.00)
GFR calc Af Amer: 86 mL/min/{1.73_m2} (ref >59)
GFR calc non Af Amer: 74 mL/min/{1.73_m2} (ref >59)
Globulin, Total: 2 g/dL (ref 1.5–4.5)
Glucose: 104 mg/dL — ABNORMAL HIGH (ref 65–99)
Potassium: 5 mmol/L (ref 3.5–5.2)
Sodium: 142 mmol/L (ref 134–144)
Total Protein: 7.2 g/dL (ref 6.0–8.5)

## 2019-07-11 LAB — LITHIUM LEVEL: Lithium Lvl: 0.5 mmol/L — ABNORMAL LOW (ref 0.6–1.2)

## 2019-07-14 ENCOUNTER — Encounter: Payer: Self-pay | Admitting: Neurology

## 2019-08-01 ENCOUNTER — Telehealth: Payer: Self-pay | Admitting: *Deleted

## 2019-08-01 NOTE — Telephone Encounter (Signed)
DOS 08/05/2019 KELLER BUNION IMPLANT RT FOOT - 28291  BCBS: Eligibility Date - 11/20/2018 - 11/19/9998  In-Network    Max Per Benefit Period Year-to-Date Remaining  CoInsurance  20%   Deductible  $1250.00 $0.00  Out-Of-Pocket  $4890.00 $0.00   AMBULATORY SURGERY  In Network  [employer] Coinsurance  Not Applicable  [id number] per [place]

## 2019-08-05 ENCOUNTER — Encounter: Payer: Self-pay | Admitting: Podiatry

## 2019-08-05 DIAGNOSIS — M2021 Hallux rigidus, right foot: Secondary | ICD-10-CM

## 2019-08-11 ENCOUNTER — Other Ambulatory Visit: Payer: Self-pay

## 2019-08-11 ENCOUNTER — Ambulatory Visit (INDEPENDENT_AMBULATORY_CARE_PROVIDER_SITE_OTHER): Payer: BC Managed Care – PPO

## 2019-08-11 ENCOUNTER — Encounter: Payer: Self-pay | Admitting: Podiatry

## 2019-08-11 ENCOUNTER — Ambulatory Visit (INDEPENDENT_AMBULATORY_CARE_PROVIDER_SITE_OTHER): Payer: BC Managed Care – PPO | Admitting: Podiatry

## 2019-08-11 VITALS — Temp 99.1°F

## 2019-08-11 DIAGNOSIS — M779 Enthesopathy, unspecified: Secondary | ICD-10-CM

## 2019-08-11 DIAGNOSIS — M2021 Hallux rigidus, right foot: Secondary | ICD-10-CM | POA: Diagnosis not present

## 2019-08-11 DIAGNOSIS — Z09 Encounter for follow-up examination after completed treatment for conditions other than malignant neoplasm: Secondary | ICD-10-CM

## 2019-08-11 NOTE — Progress Notes (Signed)
Subjective:   Patient ID: Debra Nelson, female   DOB: 38 y.o.   MRN: [id number]   HPI Patient states doing very well with surgery with only mild discomfort   ROS      Objective:  Physical Exam  Neurovascular status intact negative Homans sign noted wound edges well coapted first metatarsal with excellent range of motion first MPJ with no crepitus of the joint     Assessment:  Doing well resection of fusion right first metatarsal with implant procedure     Plan:  H&P reviewed condition and recommended continued elevation immobilization compression reapplied sterile dressing and may gradually start wearing surgical shoe versus boot  X-ray looks excellent with implant in place with no signs of pathology

## 2019-08-23 ENCOUNTER — Encounter: Payer: Self-pay | Admitting: Family Medicine

## 2019-08-23 LAB — CBC AND DIFFERENTIAL
HCT: 46 (ref 36–46)
Hemoglobin: 15.5 (ref 12.0–16.0)
Platelets: 273 (ref 150–399)
WBC: 6.4

## 2019-08-23 LAB — HEPATIC FUNCTION PANEL
ALT: 28 (ref 7–35)
AST: 22 (ref 13–35)
Alkaline Phosphatase: 92 (ref 25–125)
Bilirubin, Total: 1.3

## 2019-08-23 LAB — BASIC METABOLIC PANEL
BUN: 10 (ref 4–21)
Creatinine: 1 (ref 0.5–1.1)
Glucose: 97
Potassium: 2.7 — AB (ref 3.4–5.3)
Sodium: 142 (ref 137–147)

## 2019-08-23 LAB — TSH: TSH: 1.44 (ref 0.41–5.90)

## 2019-08-23 LAB — D-DIMER, QUANTITATIVE: D-Dimer: 158

## 2019-08-25 ENCOUNTER — Ambulatory Visit (INDEPENDENT_AMBULATORY_CARE_PROVIDER_SITE_OTHER): Payer: BC Managed Care – PPO | Admitting: Family Medicine

## 2019-08-25 ENCOUNTER — Encounter: Payer: Self-pay | Admitting: Family Medicine

## 2019-08-25 ENCOUNTER — Other Ambulatory Visit: Payer: Self-pay

## 2019-08-25 ENCOUNTER — Ambulatory Visit: Payer: Self-pay | Admitting: *Deleted

## 2019-08-25 VITALS — BP 140/90 | HR 105 | Temp 98.3°F | Ht 61.5 in | Wt 141.3 lb

## 2019-08-25 DIAGNOSIS — R002 Palpitations: Secondary | ICD-10-CM

## 2019-08-25 DIAGNOSIS — H53483 Generalized contraction of visual field, bilateral: Secondary | ICD-10-CM

## 2019-08-25 DIAGNOSIS — R42 Dizziness and giddiness: Secondary | ICD-10-CM | POA: Diagnosis not present

## 2019-08-25 DIAGNOSIS — Z23 Encounter for immunization: Secondary | ICD-10-CM | POA: Diagnosis not present

## 2019-08-25 NOTE — Telephone Encounter (Signed)
Noted  

## 2019-08-25 NOTE — Progress Notes (Signed)
Phone [phone]   Subjective:  Debra Nelson is a 38 y.o. year old very pleasant female patient who presents for/with See problem oriented charting Chief Complaint  Patient presents with  . Palpitations   ROS-no fever/chills/congestion/loss of taste or smell reported.  No edema no reported-no facial or extremity weakness. No slurred words or trouble swallowing. no blurry vision or double vision. No paresthesias. No confusion or word finding difficulties.  Does feel shaky/tremulous  Past Medical History-  Patient Active Problem List   Diagnosis Date Noted  . Chronic post-traumatic stress disorder (PTSD) 02/26/2017    Priority: High  . Narcolepsy cataplexy syndrome 10/19/2015    Priority: High  . Narcolepsy and cataplexy 03/06/2014    Priority: High  . Bipolar II disorder ([place]) 12/31/2013    Priority: High  . Asthma 10/27/2016    Priority: Medium  . GERD (gastroesophageal reflux disease) 10/27/2016    Priority: Medium  . Intractable migraine with aura with status migrainosus 10/19/2015    Priority: Medium  . Sensorineural hearing loss (SNHL) of both ears 12/12/2016    Priority: Low  . Seasonal allergic rhinitis 05/02/2016    Priority: Low  . Right ear pain 05/02/2016    Priority: Low  . Dysfunction of both eustachian tubes 05/02/2016    Priority: Low  . Chondromalacia of knee 03/07/2013    Priority: Low  . Recurrent isolated sleep paralysis 07/02/2018  . Encounter for medication management 07/02/2018  . Menstrual migraine without status migrainosus, not intractable 01/02/2018  . Chronic thumb pain, bilateral 11/23/2017  . Elevated TSH 05/07/2017  . Trigger thumb, left thumb 03/15/2017    Medications- reviewed and updated Current Outpatient Medications  Medication Sig Dispense Refill  . ALPRAZolam (XANAX) 0.5 MG tablet Take 0.5 mg by mouth every morning.     . Armodafinil 150 MG tablet Take 1 tablet (150 mg total) by mouth daily. 30 tablet 4  . busPIRone (BUSPAR)  30 MG tablet 30 mg 2 (two) times daily.    Marland Kitchen EPIPEN 2-PAK 0.3 MG/0.3ML SOAJ injection as needed.    Marland Kitchen ipratropium (ATROVENT) 0.02 % nebulizer solution Take 2.5 mLs (0.5 mg total) by nebulization 4 (four) times daily. 75 mL 3  . lamoTRIgine (LAMICTAL) 150 MG tablet Take 150 mg by mouth 2 (two) times daily.     Marland Kitchen levocetirizine (XYZAL) 5 MG tablet Take 5 mg by mouth every evening.    . montelukast (SINGULAIR) 10 MG tablet Take 10 mg by mouth at bedtime.    . Multiple Vitamins-Minerals (HAIR SKIN AND NAILS FORMULA PO) Take 1 tablet by mouth daily.     . ondansetron (ZOFRAN) 4 MG tablet     . oxyCODONE-acetaminophen (PERCOCET) 10-325 MG tablet     . Sodium Oxybate 500 MG/ML SOLN Take 3.5gms at bedtime. Take 4.5gms 2 hours later. Total of 8gms per night. Dispense 358ml. 360 mL 5  . SUMAtriptan (IMITREX) 100 MG tablet TAKE ONE-HALF TO ONE TABLET BY MOUTH EVERY 2 HOURS AS NEEDED FOR MIGRAINE, MAY REPEAT IN 2 HOURS IF HEADACHE RECURS 10 tablet 0  . albuterol (2.5 MG/3ML) 0.083% NEBU 3 mL, albuterol (5 MG/ML) 0.5% NEBU 0.5 mL Inhale into the lungs daily as needed.    Marland Kitchen albuterol (PROAIR HFA) 108 (90 Base) MCG/ACT inhaler Inhale 2 puffs into the lungs every 6 (six) hours as needed for wheezing or shortness of breath.     Current Facility-Administered Medications  Medication Dose Route Frequency Provider Last Rate Last Dose  . diclofenac  sodium (VOLTAREN) 1 % transdermal gel 2 g  2 g Topical QID PRN Aundra Dubin, PA-C         Objective:  BP 140/90   Pulse (!) 105   Temp 98.3 F (36.8 C)   Ht 5' 1.5" (1.562 m)   Wt 141 lb 4.8 oz (64.1 kg)   SpO2 98%   BMI 26.27 kg/m  Gen: NAD, resting comfortably CV: RRR no murmurs rubs or gallops Lungs: CTAB no crackles, wheeze, rhonchi Abdomen: soft/nontender/nondistended/normal bowel sounds.  Ext: no edema Skin: warm, dry  EKG: Sinus tachycardia with rate 102, normal axis, normal intervals, no hypertrophy, inverted T waves in aVF, V3.,  Some flattening  in V2 compared to 04/01/2018     Assessment and Plan   #Presyncope/palpitations S: she was in her normal state of health until Saturday per patient- per husband noted since adding new medicine last month that has made her seem more shaky.Overall she has been more shaky/tremulous since med changes- apparently placed on another medicine to help with tremors themselves. .  On Saturday (two days ago) felt like her vision got blurry intermittently(alternates between tunnel and normal vision) and felt like she would pass out when it began to tunnel. Started just with watching TV. Episodes of blurry vision worsen at times with walking and at other times does not affect her at all.    Due to vision changes, Saturday went to [facility] and there for several hours- CXR, CT of head- apparently had a fall and hit head while there- she states fall was just related to holding too much plus walking boot,  EKG, a lot of bloodwork and only abnormality was low potassium. Blood sugar was normal.   Day after ER visit/yesterday, She has started to shake all over and palpitations started yesterday. Alsoy, noted palpitations (racing and skipping beat) and felt like she couldn't breathe well starting yesterday- occurred just with sitting. Shortness of breath can be with sitting- had one period of feeling anxious in her chest. Palpitaitons last may 2019 we had ordered holter monitor but she never got a call unfortunately but luckily palpitations went away. No issues at night- issues have happened in the day- issues also seem less with xanax use.   Medication changes last time had palpitations in 2019. Typically,  xanax 0.5 mg 1-3 pills a day and went up to 2-3 then cut back to 1 and symptoms seemed to start. Had decided didn't want to be on xanax. Admits to a lot of stress right now- in process of trying to buy a hous  Even today, She feels like she might pass out intermittently when vision goes to tunnel.  but she  did not completely pass out. Had several episodes while in office- PERRLA on exam at time. She still feels shaky, tremulous today. Her husband Worries about Xyrem but no recent change in medication.   Patient does see optometry, [facility] [place]- last seen a few months ago.    A/P: 38 year old female with recent psychiatric medication changes and self attempt to cut down on Xanax use presents with palpitations, shortness of breath, recurrent tunnel vision and presyncope. - Does have some very mild T wave changes and palpitations-will refer to cardiology -Patient is aware my leading differential is anxiety -Did encourage optometry examination due to vision changes - Plan to get TSH, CBC, CMP but patient had extensive blood work with Debe Coder- we will await to review blood work per patient  request-we changed orders to future in case needed later date - With presyncope symptoms-could refer back to neurology.  Could consider neuroimaging but do not suspect stroke-reassuring neurological exam today - If shortness of breath worsens could consider echocardiogram - Did encourage her to try Xanax twice a day short-term up from once a day to see if that helps with symptoms-that would provide more data for this being evaluated -Reassuring neurological exam  Recommended follow up: As needed for acute issue Future Appointments  Date Time Provider [place]  09/01/2019 10:30 AM [facility] NURSE [facility]  01/12/2020  9:30 AM Ward Givens, NP [facility] None    Lab/Order associations:   ICD-10-CM   1. Palpitations  R00.2 EKG 12-Lead    CBC with Differential/Platelet    Comprehensive metabolic panel    TSH    Ambulatory referral to Cardiology    CANCELED: Comprehensive metabolic panel    CANCELED: TSH    CANCELED: CBC with Differential/Platelet  2. Tunnel visual field constriction of both eyes  H53.483   3. Feeling faint  R42   4. Need for immunization against influenza   Z23 Flu Vaccine QUAD 36+ mos IM    Time Stamp The duration of face-to-face time during this visit was greater than 25 minutes. Greater than 50% of this time was spent in counseling, explanation of diagnosis, planning of further management, and/or coordination of care including discussion of potential diagnosis, discussion of cardiology follow-up reasons, discussion of role of anxiety with patient symptoms.    Return precautions advised.  Garret Reddish, MD

## 2019-08-25 NOTE — Telephone Encounter (Signed)
Pt called with complaints of feeling like she was going to pass out, and her heart skipping beats; it is worse with activity; she was seen in ED in [facility] on 08/23/2019; she was told to follow up with her PCP; she had another episode at 0715 this morning with an episode of tunnel vision; she had previously been referred to cardiology but there was no follow through; She sees Dr Yong Channel, [place]; pt transferred to [place] for scheduling. Reason for Disposition . [1] Skipped or extra beat(s) AND [2] increases with exercise or exertion  Answer Assessment - Initial Assessment Questions 1. DESCRIPTION: "Please describe your heart rate or heart beat that you are having" (e.g., fast/slow, regular/irregular, skipped or extra beats, "palpitations")     Skipping beats 2. ONSET: "When did it start?" (Minutes, hours or days)     08/23/2019 3. DURATION: "How long does it last" (e.g., seconds, minutes, hours)    minutes 4. PATTERN "Does it come and go, or has it been constant since it started?"  "Does it get worse with exertion?"   "Are you feeling it now?"     Intermittent; not worse with exertion; no 5. TAP: "Using your hand, can you tap out what you are feeling on a chair or table in front of you, so that I can hear?" (Note: not all patients can do this)        6. HEART RATE: "Can you tell me your heart rate?" "How many beats in 15 seconds?"  (Note: not all patients can do this)     28 beats in 15 seconds 7. RECURRENT SYMPTOM: "Have you ever had this before?" If so, ask: "When was the last time?" and "What happened that time?"      Yes been a few months 8. CAUSE: "What do you think is causing the palpitations?"     Not sure 9. CARDIAC HISTORY: "Do you have any history of heart disease?" (e.g., heart attack, angina, bypass surgery, angioplasty, arrhythmia)      Palpitations 10. OTHER SYMPTOMS: "Do you have any other symptoms?" (e.g., dizziness, chest pain, sweating, difficulty breathing)  Felt like she was going to pass out 11. PREGNANCY: "Is there any chance you are pregnant?" "When was your last menstrual period?"       No LMP 08/01/2019  Protocols used: HEART RATE AND HEARTBEAT QUESTIONS-A-AH

## 2019-08-25 NOTE — Telephone Encounter (Signed)
Patient is scheduled for today at 4pm.

## 2019-08-25 NOTE — Patient Instructions (Addendum)
Health Maintenance Due  Topic Date Due  . PAP SMEAR-Dr. Nelda Marseille- pleaes request records before you leave 03/30/2018  . INFLUENZA VACCINE -today 06/21/2019   We will call you within two to three days about your referral to cardiology. If you do not hear within 3-4 days , give [place] a call.   Call Dr. [place] for updated eye exam though really don't think this is your eyes primarily  We will look out for records  Consider Dr. Roddie Mc follow up or   Let us know immediately about new or worsening symptoms

## 2019-08-26 ENCOUNTER — Encounter: Payer: Self-pay | Admitting: Family Medicine

## 2019-08-27 ENCOUNTER — Ambulatory Visit (INDEPENDENT_AMBULATORY_CARE_PROVIDER_SITE_OTHER): Payer: BC Managed Care – PPO | Admitting: Cardiology

## 2019-08-27 ENCOUNTER — Telehealth: Payer: Self-pay

## 2019-08-27 ENCOUNTER — Other Ambulatory Visit: Payer: Self-pay

## 2019-08-27 ENCOUNTER — Encounter: Payer: Self-pay | Admitting: Cardiology

## 2019-08-27 VITALS — BP 130/90 | HR 113 | Ht 61.5 in | Wt 140.0 lb

## 2019-08-27 DIAGNOSIS — R000 Tachycardia, unspecified: Secondary | ICD-10-CM | POA: Insufficient documentation

## 2019-08-27 DIAGNOSIS — R002 Palpitations: Secondary | ICD-10-CM | POA: Diagnosis not present

## 2019-08-27 DIAGNOSIS — R0602 Shortness of breath: Secondary | ICD-10-CM | POA: Insufficient documentation

## 2019-08-27 MED ORDER — PROPRANOLOL HCL 20 MG PO TABS: 20.0000 mg | ORAL_TABLET | Freq: Two times a day (BID) | ORAL | 2 refills | Status: DC

## 2019-08-27 NOTE — Progress Notes (Signed)
Patient referred by Marin Olp, MD for palpitations  Subjective:   Debra Nelson, female    DOB: 06/18/1981, 38 y.o.   MRN: [id number]   Chief Complaint  Patient presents with  . Palpitations  . New Patient (Initial Visit)     HPI  38 y.o. Caucasian female with bipolar disorder with depression, anxiety, referred for evaluation of palpitations.  Patient has noticed that her heart rate runs about 100 bpm for most part of the day.  This has been occurring for close to a month now.  However, last weekend, patient was at [facility] with complaints of palpitations and heart skipping beat sensation.  She also had symptoms of tunnel vision that lasted throughout the day.  She has occasional chest tightness that lasts for brief 1-2 seconds, associated with her episodes of palpitations. She reportedly has noticed some shortness of breath on exertion.  She underwent work-up at [facility], records not available to me at this time.  Patient endorses that there has been changes to her bipolar and anxiety medications recently.  She has tried to wean herself off Xanax, currently takes 2 pills a day.  She is non-smoker, nondiabetic, does not have any strong family history of early coronary artery disease.  Patient recently underwent surgery to her foot and is currently wearing an immobilization boot.  She is able to walk with this boot.  She hopes that this will be switched to a surgical boot next week.  Past Medical History:  Diagnosis Date  . Asthma   . Bipolar disorder with depression ([place])    and PTSD  . Chicken pox   . Deviated septum   . DJD (degenerative joint disease)    Severe of the right knee-Dr Creighton-ortho and Dr Gean Birchwood, PA-preferred pain managment  . GERD (gastroesophageal reflux disease)   . Seasonal allergic rhinitis      Past Surgical History:  Procedure Laterality Date  . knee surgery x5     08,11, 12, 13 x2- R knee  . right foot  surgery     arthritis/bone spurus. plate placed.   . WISDOM TOOTH EXTRACTION       Social History   Socioeconomic History  . Marital status: Married    Spouse name: [name]  . Number of children: 4  . Years of education: College  . Highest education level: Not on file  Occupational History    Employer: OTHER  Social Needs  . Financial resource strain: Not on file  . Food insecurity    Worry: Not on file    Inability: Not on file  . Transportation needs    Medical: Not on file    Non-medical: Not on file  Tobacco Use  . Smoking status: Never Smoker  . Smokeless tobacco: Never Used  Substance and Sexual Activity  . Alcohol use: No  . Drug use: No  . Sexual activity: Yes    Partners: Male    Birth control/protection: Pill  Lifestyle  . Physical activity    Days per week: Not on file    Minutes per session: Not on file  . Stress: Not on file  Relationships  . Social Herbalist on phone: Not on file    Gets together: Not on file    Attends religious service: Not on file    Active member of club or organization: Not on file    Attends meetings of clubs or organizations: Not on  file    Relationship status: Not on file  . Intimate partner violence    Fear of current or ex partner: Not on file    Emotionally abused: Not on file    Physically abused: Not on file    Forced sexual activity: Not on file  Other Topics Concern  . Not on file  Social History Narrative   Patient is married ([name]) and lives at home with her family   4 adults - 2 kids 50% of the time (1 son, 1 daughter). 2 older children (one age 31 from rape, then 9 year old- does not get to see- related to bipolar) Moved in with in laws to help them      Patient has 4 children (3 of her own and 1 step son).    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   Patient is right-handed.     Family  History  Problem Relation Age of Onset  . Mental illness Mother        does not speak to regularly so does not know full history  . Other Father        medical issues, divorce related  . Other Brother        ptsd- fully discharged medically disabled before age 34  . Cancer Paternal Grandfather      Current Outpatient Medications on File Prior to Visit  Medication Sig Dispense Refill  . albuterol (2.5 MG/3ML) 0.083% NEBU 3 mL, albuterol (5 MG/ML) 0.5% NEBU 0.5 mL Inhale into the lungs daily as needed.    Marland Kitchen albuterol (PROAIR HFA) 108 (90 Base) MCG/ACT inhaler Inhale 2 puffs into the lungs every 6 (six) hours as needed for wheezing or shortness of breath.    . ALPRAZolam (XANAX) 0.5 MG tablet Take 0.5 mg by mouth every morning.     . Armodafinil 150 MG tablet Take 1 tablet (150 mg total) by mouth daily. 30 tablet 4  . busPIRone (BUSPAR) 30 MG tablet 30 mg 2 (two) times daily.    Marland Kitchen EPIPEN 2-PAK 0.3 MG/0.3ML SOAJ injection as needed.    Marland Kitchen ipratropium (ATROVENT) 0.02 % nebulizer solution Take 2.5 mLs (0.5 mg total) by nebulization 4 (four) times daily. 75 mL 3  . lamoTRIgine (LAMICTAL) 150 MG tablet Take 150 mg by mouth 2 (two) times daily.     Marland Kitchen levocetirizine (XYZAL) 5 MG tablet Take 5 mg by mouth every evening.    . montelukast (SINGULAIR) 10 MG tablet Take 10 mg by mouth at bedtime.    . Multiple Vitamins-Minerals (HAIR SKIN AND NAILS FORMULA PO) Take 1 tablet by mouth daily.     . ondansetron (ZOFRAN) 4 MG tablet     . oxyCODONE-acetaminophen (PERCOCET) 10-325 MG tablet     . Sodium Oxybate 500 MG/ML SOLN Take 3.5gms at bedtime. Take 4.5gms 2 hours later. Total of 8gms per night. Dispense 335ml. 360 mL 5  . SUMAtriptan (IMITREX) 100 MG tablet TAKE ONE-HALF TO ONE TABLET BY MOUTH EVERY 2 HOURS AS NEEDED FOR MIGRAINE, MAY REPEAT IN 2 HOURS IF HEADACHE RECURS 10 tablet 0   Current Facility-Administered Medications on File Prior to Visit  Medication Dose Route Frequency Provider Last Rate  Last Dose  . diclofenac sodium (VOLTAREN) 1 % transdermal gel 2 g  2 g Topical QID PRN Aundra Dubin, PA-C  Cardiovascular studies:  EKG 08/27/2019: Sinus tachycardia 114 bpm.  Anterior and inferior T wave inversion. Cannot exclude ischemia. Unchanged compared to EKG on 08/25/2019. New since EKG's in 03/2018.  Recent labs: Results for ANGLES, ARCINIEGA (MRN [id number]) as of 08/27/2019 05:44  Ref. Range 07/10/2019 10:08  Sodium Latest Ref Range: 134 - 144 mmol/L 142  Potassium Latest Ref Range: 3.5 - 5.2 mmol/L 5.0  Chloride Latest Ref Range: 96 - 106 mmol/L 102  CO2 Latest Ref Range: 20 - 29 mmol/L 23  Glucose Latest Ref Range: 65 - 99 mg/dL 104 (H)  BUN Latest Ref Range: 6 - 24 mg/dL 8  Creatinine Latest Ref Range: 0.57 - 1.00 mg/dL 0.96  Calcium Latest Ref Range: 8.7 - 10.2 mg/dL 10.3 (H)  BUN/Creatinine Ratio Latest Ref Range: 9 - 23  8 (L)  Alkaline Phosphatase Latest Ref Range: 39 - 117 IU/L 91  Albumin Latest Ref Range: 3.8 - 4.8 g/dL 5.2 (H)  Albumin/Globulin Ratio Latest Ref Range: 1.2 - 2.2  2.6 (H)  AST Latest Ref Range: 0 - 40 IU/L 17  ALT Latest Ref Range: 0 - 32 IU/L 15  Total Protein Latest Ref Range: 6.0 - 8.5 g/dL 7.2  Total Bilirubin Latest Ref Range: 0.0 - 1.2 mg/dL 0.7  GFR, Est Non African American Latest Ref Range: >59 mL/min/1.73 74  GFR, Est African American Latest Ref Range: >59 mL/min/1.73 86   Results for ANJELLICA, GREENLAND (MRN [id number]) as of 08/27/2019 05:44  Ref. Range 07/10/2019 10:08  TSH Latest Ref Range: 0.450 - 4.500 uIU/mL 1.600  T4,Free(Direct) Latest Ref Range: 0.82 - 1.77 ng/dL 1.26    Results for CALESHA, CIRAOLO (MRN [id number]) as of 08/27/2019 05:44  Ref. Range 07/02/2018 10:50  WBC Latest Ref Range: 3.4 - 10.8 x10E3/uL 7.3  RBC Latest Ref Range: 3.77 - 5.28 x10E6/uL 4.59  Hemoglobin Latest Ref Range: 11.1 - 15.9 g/dL 14.7  HCT Latest Ref Range: 34.0 - 46.6 % 44.7  MCV Latest Ref Range: 79 - 97 fL 97  MCH Latest Ref Range: 26.6 -  33.0 pg 32.0  MCHC Latest Ref Range: 31.5 - 35.7 g/dL 32.9  RDW Latest Ref Range: 12.3 - 15.4 % 12.6  Platelets Latest Ref Range: 150 - 450 x10E3/uL 369    Review of Systems  Constitution: Negative for decreased appetite, malaise/fatigue, weight gain and weight loss.  HENT: Negative for congestion.   Eyes: Negative for visual disturbance.  Cardiovascular: Positive for palpitations. Negative for chest pain, dyspnea on exertion, leg swelling and syncope.  Respiratory: Negative for cough.   Endocrine: Negative for cold intolerance.  Hematologic/Lymphatic: Does not bruise/bleed easily.  Skin: Negative for itching and rash.  Musculoskeletal: Negative for myalgias.  Gastrointestinal: Negative for abdominal pain, nausea and vomiting.  Genitourinary: Negative for dysuria.  Neurological: Negative for dizziness and weakness.  Psychiatric/Behavioral: The patient is nervous/anxious.   All other systems reviewed and are negative.        Vitals:   08/27/19 0957 08/27/19 1020  BP: (!) 123/94 130/90  Pulse: (!) 119 (!) 113  SpO2: 98% 99%     Body mass index is 26.02 kg/m. Filed Weights   08/27/19 0957  Weight: 140 lb (63.5 kg)     Objective:   Physical Exam  Constitutional: She is oriented to person, place, and time. She appears well-developed and well-nourished. No distress.  HENT:  Head: Normocephalic and atraumatic.  Eyes: Pupils are equal, round, and reactive to light. Conjunctivae are normal.  Neck: No JVD present.  Cardiovascular: Regular rhythm and intact distal pulses. Tachycardia present.  No murmur heard. Pulmonary/Chest: Effort normal and breath sounds normal. She has no wheezes. She has no rales.  Abdominal: Soft. Bowel sounds are normal. There is no rebound.  Musculoskeletal:        General: No edema.  Lymphadenopathy:    She has no cervical adenopathy.  Neurological: She is alert and oriented to person, place, and time. No cranial nerve deficit.  Skin: Skin is  warm and dry.  Psychiatric:  Anxious  Nursing note and vitals reviewed.         Assessment & Recommendations:   38 y.o. Caucasian female with bipolar disorder with depression, anxiety, referred for evaluation of palpitations.  Palpitations, tachycardia: She has sinus tachycardia at rest, and also complains of palpitations at other times.  Most likely etiology is anxiety and perhaps withdrawal from Xanax.  However, other secondary causes of sinus tachycardia need to be excluded.  TSH is normal.  I will obtain a d-dimer.  She has T wave inversions in inferior and anteroseptal leads on EKG finding tachycardia.  At present, she has not reported any chest pain.  Nonetheless, pending records from [facility], I will check a one-time troponin.  I will obtain an echocardiogram to rule out any structural cardiac abnormalities and 24 Holter monitor to assess her baseline resting heart rate as well as possible arrhythmias.  Given her anxiety and tachycardia, have started on propranolol 20 mg twice daily and encouraged her to talk to her psychiatrist regarding management of her medications.    Nigel Mormon, MD [facility] Pager: [phone] Office: [phone] If no answer Cell [phone]

## 2019-08-28 ENCOUNTER — Encounter: Payer: Self-pay | Admitting: Podiatry

## 2019-08-28 ENCOUNTER — Telehealth: Payer: Self-pay

## 2019-08-28 ENCOUNTER — Encounter: Payer: Self-pay | Admitting: Family Medicine

## 2019-08-28 LAB — TROPONIN I: Troponin I: <0.01 ng/mL (ref 0.00–0.04)

## 2019-08-28 LAB — D-DIMER, QUANTITATIVE: D-DIMER: 0.29 mg/L FEU (ref 0.00–0.49)

## 2019-08-28 NOTE — Telephone Encounter (Signed)
I called pt and she states her foot is tingling. I asked pt what type dressing she had on and she stated an ace wrap. I asked pt is she had taken the dressing off. Pt states she did but still had tingling. I asked pt if she had any other dressing on and she stated no. I asked if she had taken the ace wrap off to go to bed and she stated no one told her to take the ace wrap off. I told pt to remove the ace wrap and dangle for 15 minutes and may leave off until she gets up to move around, then rewrap ace beginning at the toes and roll evenly and snuggly down the foot and up the leg, to maintain compression to train swelling to stay out of the surgery foot. I told pt she did not need to sleep in the ace and could take off to shower.  I told pt if the toes were warm with good color and it the toes returned quickly to color when pressed she should be okay, and could call with concerns.

## 2019-08-28 NOTE — Telephone Encounter (Signed)
Informed patient of lab results. Patient verbalized understanding. 

## 2019-08-28 NOTE — Telephone Encounter (Signed)
-----   Message from [facility], MD sent at 08/28/2019  5:41 AM EDT ----- Normal tropinin (no heart attack), and d-Dimer (no lung clot)  Thanks MJP

## 2019-08-28 NOTE — Telephone Encounter (Signed)
Called patient with lab results patient verbalized understanding.

## 2019-08-28 NOTE — Telephone Encounter (Signed)
-----   Message from [facility], MD sent at 08/28/2019  5:41 AM EDT ----- Normal tropinin (no heart attack), and d-Dimer (no lung clot)  Thanks MJP

## 2019-08-28 NOTE — Telephone Encounter (Signed)
From pt

## 2019-09-01 ENCOUNTER — Other Ambulatory Visit: Payer: Self-pay

## 2019-09-01 ENCOUNTER — Encounter: Payer: Self-pay | Admitting: Podiatry

## 2019-09-01 ENCOUNTER — Ambulatory Visit (INDEPENDENT_AMBULATORY_CARE_PROVIDER_SITE_OTHER): Payer: BC Managed Care – PPO | Admitting: Podiatry

## 2019-09-01 ENCOUNTER — Ambulatory Visit (INDEPENDENT_AMBULATORY_CARE_PROVIDER_SITE_OTHER): Payer: BC Managed Care – PPO

## 2019-09-01 DIAGNOSIS — M2021 Hallux rigidus, right foot: Secondary | ICD-10-CM | POA: Diagnosis not present

## 2019-09-01 DIAGNOSIS — Z09 Encounter for follow-up examination after completed treatment for conditions other than malignant neoplasm: Secondary | ICD-10-CM

## 2019-09-01 NOTE — Progress Notes (Signed)
Subjective:   Patient ID: Debra Nelson, female   DOB: 38 y.o.   MRN: [id number]   HPI Patient states overall doing well and states she is not having the pain that she had prior to surgery but she did have some discomfort earlier today   ROS      Objective:  Physical Exam  Neurovascular status intact negative Homans sign noted with patient's right foot showing good range of motion no crepitus of the joint or pain with movement     Assessment:  Doing well post fusion resection with implant arthroplasty     Plan:  H&P conditions reviewed and at this point I am very satisfied with how she is doing and I want her to begin shoe usage and dispensed ankle compression stocking along with continued elevation.  Reappoint for [place] to recheck again but is making very good progress at this time  X-ray indicates that the implant is in place good alignment noted

## 2019-09-02 ENCOUNTER — Encounter: Payer: Self-pay | Admitting: Family Medicine

## 2019-09-03 ENCOUNTER — Other Ambulatory Visit: Payer: Self-pay

## 2019-09-03 ENCOUNTER — Ambulatory Visit (INDEPENDENT_AMBULATORY_CARE_PROVIDER_SITE_OTHER): Payer: BC Managed Care – PPO

## 2019-09-03 ENCOUNTER — Ambulatory Visit (INDEPENDENT_AMBULATORY_CARE_PROVIDER_SITE_OTHER): Payer: BC Managed Care – PPO | Admitting: Cardiology

## 2019-09-03 DIAGNOSIS — R000 Tachycardia, unspecified: Secondary | ICD-10-CM

## 2019-09-03 DIAGNOSIS — R002 Palpitations: Secondary | ICD-10-CM

## 2019-09-04 ENCOUNTER — Other Ambulatory Visit: Payer: Self-pay | Admitting: Neurology

## 2019-09-04 NOTE — Telephone Encounter (Signed)
Yes. You're right. Please let her know that I will review available information on the monitor and echocardiogram.  Thanks MJP

## 2019-09-04 NOTE — Telephone Encounter (Signed)
From the book looks like just a 24 hour holter not with preventice

## 2019-09-04 NOTE — Telephone Encounter (Signed)
Please read

## 2019-09-05 DIAGNOSIS — I493 Ventricular premature depolarization: Secondary | ICD-10-CM

## 2019-09-05 NOTE — Progress Notes (Signed)
Spoke with patient concerning results. Patient verbalized understanding.

## 2019-09-07 ENCOUNTER — Encounter: Payer: Self-pay | Admitting: Podiatry

## 2019-09-09 NOTE — Progress Notes (Signed)
Please let the patient know that there were rare extra beats. No significant arhythmia seen.  Holter monitor 07/29/2019: Dominant rhythm sinus. 39-119 bpm. Avg HR 70 bpm. Rare PAC/PVC's seen. No other arrhythmia.

## 2019-09-10 ENCOUNTER — Other Ambulatory Visit: Payer: Self-pay | Admitting: Cardiology

## 2019-09-10 DIAGNOSIS — R0789 Other chest pain: Secondary | ICD-10-CM

## 2019-09-10 NOTE — Progress Notes (Signed)
Gave results to patient. She verbalized understanding

## 2019-09-10 NOTE — Telephone Encounter (Signed)
Unclear if these are cardiac in origin. Recommend exercise treadmill stress test, like we had discussed before. Will place order.   09/10/19

## 2019-09-10 NOTE — Telephone Encounter (Signed)
Please read

## 2019-09-10 NOTE — Progress Notes (Signed)
LMTCB concerning monitor results.

## 2019-09-12 NOTE — Telephone Encounter (Signed)
If symptoms are controlled with propranolol, see if a steroid course will enable [place] to continue propranolol.

## 2019-09-12 NOTE — Telephone Encounter (Signed)
Lets try one week without it and see if symptoms improve.

## 2019-09-12 NOTE — Telephone Encounter (Signed)
Please read

## 2019-09-16 ENCOUNTER — Encounter: Payer: Self-pay | Admitting: Family Medicine

## 2019-09-16 ENCOUNTER — Ambulatory Visit (INDEPENDENT_AMBULATORY_CARE_PROVIDER_SITE_OTHER): Payer: BC Managed Care – PPO | Admitting: Family Medicine

## 2019-09-16 ENCOUNTER — Ambulatory Visit: Payer: Self-pay

## 2019-09-16 VITALS — Ht 62.0 in | Wt 143.0 lb

## 2019-09-16 DIAGNOSIS — M5412 Radiculopathy, cervical region: Secondary | ICD-10-CM

## 2019-09-16 MED ORDER — PREDNISONE 20 MG PO TABS: ORAL_TABLET | ORAL | 0 refills | Status: DC

## 2019-09-16 NOTE — Progress Notes (Signed)
Phone [phone]   Subjective:  Virtual visit via Video note. Chief complaint: Chief Complaint  Patient presents with  . Arm Pain   This visit type was conducted due to national recommendations for restrictions regarding the COVID-19 Pandemic (e.g. social distancing).  This format is felt to be most appropriate for this patient at this time balancing risks to patient and risks to population by having him in for in person visit.  No physical exam was performed (except for noted visual exam or audio findings with Telehealth visits).    Our team/I connected with [name] at 11:40 AM EDT by a video enabled telemedicine application ([email or web address] or caregility through epic) and verified that I am speaking with the correct person using two identifiers.  Location patient: [facility] Location provider: [facility], office Persons participating in the virtual visit:  patient  Our team/I discussed the limitations of evaluation and management by telemedicine and the availability of in person appointments. In light of current covid-19 pandemic, patient also understands that we are trying to protect them by minimizing in office contact if at all possible.  The patient expressed consent for telemedicine visit and agreed to proceed. Patient understands insurance will be billed.   ROS-  No facial or extremity weakness. No slurred words or trouble swallowing. no blurry vision or double vision. No paresthesias- outside of left arm (other than generalized pins and needles sensation but worse in bicep). No confusion or word finding difficulties.   Past Medical History-  Patient Active Problem List   Diagnosis Date Noted  . Chronic post-traumatic stress disorder (PTSD) 02/26/2017    Priority: High  . Narcolepsy cataplexy syndrome 10/19/2015    Priority: High  . Narcolepsy and cataplexy 03/06/2014    Priority: High  . Bipolar II disorder ([place]) 12/31/2013    Priority: High  . Asthma 10/27/2016    Priority:  Medium  . GERD (gastroesophageal reflux disease) 10/27/2016    Priority: Medium  . Intractable migraine with aura with status migrainosus 10/19/2015    Priority: Medium  . Sensorineural hearing loss (SNHL) of both ears 12/12/2016    Priority: Low  . Seasonal allergic rhinitis 05/02/2016    Priority: Low  . Right ear pain 05/02/2016    Priority: Low  . Dysfunction of both eustachian tubes 05/02/2016    Priority: Low  . Chondromalacia of knee 03/07/2013    Priority: Low  . Palpitations 08/27/2019  . Sinus tachycardia 08/27/2019  . Shortness of breath 08/27/2019  . Recurrent isolated sleep paralysis 07/02/2018  . Encounter for medication management 07/02/2018  . Menstrual migraine without status migrainosus, not intractable 01/02/2018  . Chronic thumb pain, bilateral 11/23/2017  . Elevated TSH 05/07/2017  . Trigger thumb, left thumb 03/15/2017    Medications- reviewed and updated Current Outpatient Medications  Medication Sig Dispense Refill  . albuterol (2.5 MG/3ML) 0.083% NEBU 3 mL, albuterol (5 MG/ML) 0.5% NEBU 0.5 mL Inhale into the lungs daily as needed.    Marland Kitchen albuterol (PROAIR HFA) 108 (90 Base) MCG/ACT inhaler Inhale 2 puffs into the lungs every 6 (six) hours as needed for wheezing or shortness of breath.    . ALPRAZolam (XANAX) 0.5 MG tablet Take 0.5 mg by mouth as needed for anxiety.     . Armodafinil 150 MG tablet Take 1 tablet (150 mg total) by mouth daily. (Patient taking differently: Take 150 mg by mouth as needed. ) 30 tablet 4  . busPIRone (BUSPAR) 30 MG tablet 30 mg 2 (two) times  daily.    Marland Kitchen EPIPEN 2-PAK 0.3 MG/0.3ML SOAJ injection as needed.    . lamoTRIgine (LAMICTAL) 150 MG tablet Take 150 mg by mouth 2 (two) times daily.     Marland Kitchen levocetirizine (XYZAL) 5 MG tablet Take 5 mg by mouth every evening.    . Magnesium 200 MG CHEW Chew by mouth daily.    . montelukast (SINGULAIR) 10 MG tablet Take 10 mg by mouth at bedtime.    . Multiple Vitamins-Minerals (HAIR SKIN AND  NAILS FORMULA PO) Take 1 tablet by mouth daily.     . ondansetron (ZOFRAN) 4 MG tablet as needed.     . propranolol (INDERAL) 20 MG tablet Take 1 tablet (20 mg total) by mouth 2 (two) times daily. 60 tablet 2  . Sodium Oxybate 500 MG/ML SOLN Take 3.5gms at bedtime. Take 4.5gms 2 hours later. Total of 8gms per night. Dispense 322ml. 360 mL 5  . SUMAtriptan (IMITREX) 100 MG tablet TAKE ONE-HALF TO ONE TABLET BY MOUTH EVERY 2 HOURS AS NEEDED FOR MIGRAINE, MAY REPEAT IN 2 HOURS IF HEADACHE RECURS 10 tablet 0  . predniSONE (DELTASONE) 20 MG tablet Take 2 pills for 3 days, 1 pill for 4 days 10 tablet 0   Current Facility-Administered Medications  Medication Dose Route Frequency Provider Last Rate Last Dose  . diclofenac sodium (VOLTAREN) 1 % transdermal gel 2 g  2 g Topical QID PRN Aundra Dubin, PA-C         Objective:  Ht 5\' 2"  (1.575 m)   Wt 143 lb (64.9 kg)   LMP  (LMP Unknown)   BMI 26.16 kg/m  self reported vitals Gen: NAD, resting comfortably Lungs: nonlabored, normal respiratory rate  Skin: appears dry, no obvious rash (felt like may break out in rash but never did)  We attempted spurling exam on herself- she did have some pain going into left arm and shoulder and some paresthesias    Assessment and Plan   # Left bicep pain and numbness/cold sensation. Also with pain in neck and shoulder on left. S: for 2-3 days she has had left arm bicep has felt numb and cold.  She states today she feels a pins and needles sensation starting on bicep but has felt some pins and needles diffusely. She states that if she pushes on the muscle of her shoulder it is painful to from her bicep up to the neck and slightly into the back- if she palpates over shoulder, bicep or neck it all reproduces same pain.   She has tried ibuprofen and ice with little improvement. Her pain is 7/10  At its worst with more "discomfort" than pain but down to 2-3/10 at other times- seems to come and go in intensity. She is  not having any Shortness of breath (other than with some anxiety when thinking about he arm this AM but not at present), problems with bowel or bladder, any issues with moving head arm or hand or issues with griping. Occasionally tingling down into left hand.    She states that she has been packing boxes to move but has not injured her arm that she is aware. No fall or trauma. She fells stressed. She has no other neurological symptoms. No chest pain with this.   No signs of weakness yet in the arm.  A/P: Symptoms are concerning for potential left cervical radiculopathy potentially from bulging disc and symptoms are reproduced with attempted Spurling through explaining maneuver to patient -patient has tolerated prednisone  in the past for asthma-we opted to trial a course of prednisone at this time.  We discussed hopeful improvement within 1 to 2 weeks and if not improving refer to sports medicine for orthopedics.  Constellation of symptoms does not appear consistent with stroke.  She does not report symptoms are exertional.  So far her cardiac work-up for palpitations had a largely reassuring echocardiogram as well as Holter monitor-she is pending a stress test to be on the safe side as well and we will look out for those results.  She still knows if she has new or worsening symptoms to proceed to the emergency room. -I suspect anxiety plays a role with diffuse pins and needle sensation she is experiencing.  Recommended follow up: As needed for acute issue Future Appointments  Date Time Provider [place]  09/22/2019  9:45 AM Nigel Mormon, MD [facility] None  10/06/2019 12:00 PM [facility] None  10/13/2019 10:45 AM Regal, Tamala Fothergill, DPM [facility]  01/12/2020  9:30 AM Ward Givens, NP [facility] None    Lab/Order associations:   ICD-10-CM   1. Left cervical radiculopathy  M54.12     Meds ordered this encounter  Medications  . predniSONE (DELTASONE) 20 MG tablet     Sig: Take 2 pills for 3 days, 1 pill for 4 days    Dispense:  10 tablet    Refill:  0    Return precautions advised.  Garret Reddish, MD

## 2019-09-16 NOTE — Telephone Encounter (Signed)
From pt

## 2019-09-16 NOTE — Telephone Encounter (Signed)
I do not think these symptoms are related to the heart. We will do the upcoming tests as scheduled. Continue follow up with PCP regarding the above complaints.   09/16/19

## 2019-09-16 NOTE — Telephone Encounter (Signed)
See note

## 2019-09-16 NOTE — Telephone Encounter (Signed)
Call received from patient who states that for 2-3 days she has had left arm bicep has felt numb and cold.  She states today she feels a pins and needles sensation. She states that if she pushes on the muscle of her shoulder it is painful to her shoulder at the neck. She states that she has been packing boxes to move but has not injured her arm that she is aware. She fells stressed. She has no other neurological symptoms. Care advice read to patient.  She verbalized understanding.  Call transferred to office for scheduling.  Reason for Disposition . Neck pain (and neurologic deficit)  Answer Assessment - Initial Assessment Questions 1. SYMPTOM: "What is the main symptom you are concerned about?" (e.g., weakness, numbness)     Left arm bicept area pins and needles feels cold 2. ONSET: "When did this start?" (minutes, hours, days; while sleeping)     2-3 days numbness  Neck is sore pins and needles today 3. LAST NORMAL: "When was the last time you were normal (no symptoms)?"     2-3 days ago 4. PATTERN "Does this come and go, or has it been constant since it started?"  "Is it present now?"    Comes and goes 5. CARDIAC SYMPTOMS: "Have you had any of the following symptoms: chest pain, difficulty breathing, palpitations?"    no 6. NEUROLOGIC SYMPTOMS: "Have you had any of the following symptoms: headache, dizziness, vision loss, double vision, changes in speech, unsteady on your feet?"     no 7. OTHER SYMPTOMS: "Do you have any other symptoms?"     Sore neck 8. PREGNANCY: "Is there any chance you are pregnant?" "When was your last menstrual period?"    No pre menopause  Not regular  Protocols used: NEUROLOGIC DEFICIT-A-AH

## 2019-09-16 NOTE — Patient Instructions (Addendum)
Health Maintenance Due  Topic Date Due  . PAP SMEAR- she will have a copy sent to [place] from GYN with Dr. Nelda Marseille 03/30/2018

## 2019-09-16 NOTE — Telephone Encounter (Signed)
Please see message and advise, you started her on oral prednisone?

## 2019-09-17 NOTE — Telephone Encounter (Signed)
Called pt no answer, left a vm

## 2019-09-19 ENCOUNTER — Other Ambulatory Visit: Payer: Self-pay | Admitting: Obstetrics & Gynecology

## 2019-09-19 DIAGNOSIS — N63 Unspecified lump in unspecified breast: Secondary | ICD-10-CM

## 2019-09-20 ENCOUNTER — Other Ambulatory Visit: Payer: Self-pay | Admitting: Neurology

## 2019-09-22 ENCOUNTER — Encounter: Payer: Self-pay | Admitting: Family Medicine

## 2019-09-22 ENCOUNTER — Ambulatory Visit: Payer: BC Managed Care – PPO | Admitting: Cardiology

## 2019-09-22 NOTE — Telephone Encounter (Signed)
I did not see any significant heart rhythm problems on the monitor. Will see heart rate response on stress test. Scheduled.

## 2019-09-22 NOTE — Progress Notes (Deleted)
Patient referred by Marin Olp, MD for palpitations  Subjective:   Debra Nelson, female    DOB: 09-09-1981, 38 y.o.   MRN: [id number]   No chief complaint on file.    HPI  38 y.o. Caucasian female with bipolar disorder with depression, anxiety, referred for evaluation of palpitations.  Patient has noticed that her heart rate runs about 100 bpm for most part of the day.  This has been occurring for close to a month now.  However, last weekend, patient was at [facility] with complaints of palpitations and heart skipping beat sensation.  She also had symptoms of tunnel vision that lasted throughout the day.  She has occasional chest tightness that lasts for brief 1-2 seconds, associated with her episodes of palpitations. She reportedly has noticed some shortness of breath on exertion.  She underwent work-up at [facility], records not available to me at this time.  Patient endorses that there has been changes to her bipolar and anxiety medications recently.  She has tried to wean herself off Xanax, currently takes 2 pills a day.  She is non-smoker, nondiabetic, does not have any strong family history of early coronary artery disease.  Patient recently underwent surgery to her foot and is currently wearing an immobilization boot.  She is able to walk with this boot.  She hopes that this will be switched to a surgical boot next week.  Past Medical History:  Diagnosis Date  . Asthma   . Bipolar disorder with depression ([place])    and PTSD  . Chicken pox   . Chronic migraine w/o aura, not intractable, w stat migr   . Deviated septum   . DJD (degenerative joint disease)    Severe of the right knee-Dr Creighton-ortho and Dr Gean Birchwood, PA-preferred pain managment  . GERD (gastroesophageal reflux disease)   . Narcolepsy   . PTSD (post-traumatic stress disorder)   . Seasonal allergic rhinitis      Past Surgical History:  Procedure Laterality Date  . knee  surgery x5     08,11, 12, 13 x2- R knee  . right foot surgery     arthritis/bone spurus. plate placed.   . WISDOM TOOTH EXTRACTION       Social History   Socioeconomic History  . Marital status: Married    Spouse name: [name]  . Number of children: 3  . Years of education: College  . Highest education level: Not on file  Occupational History    Employer: OTHER  Social Needs  . Financial resource strain: Not on file  . Food insecurity    Worry: Not on file    Inability: Not on file  . Transportation needs    Medical: Not on file    Non-medical: Not on file  Tobacco Use  . Smoking status: Never Smoker  . Smokeless tobacco: Never Used  Substance and Sexual Activity  . Alcohol use: No  . Drug use: No  . Sexual activity: Yes    Partners: Male    Birth control/protection: Pill  Lifestyle  . Physical activity    Days per week: Not on file    Minutes per session: Not on file  . Stress: Not on file  Relationships  . Social Herbalist on phone: Not on file    Gets together: Not on file    Attends religious service: Not on file    Active member of club or organization: Not on  file    Attends meetings of clubs or organizations: Not on file    Relationship status: Not on file  . Intimate partner violence    Fear of current or ex partner: Not on file    Emotionally abused: Not on file    Physically abused: Not on file    Forced sexual activity: Not on file  Other Topics Concern  . Not on file  Social History Narrative   Patient is married ([name]) and lives at home with her family   4 adults - 2 kids 50% of the time (1 son, 1 daughter). 2 older children (one age 50 from rape, then 85 year old- does not get to see- related to bipolar) Moved in with in laws to help them      Patient has 4 children (3 of her own and 1 step son).    Patient has a college education. Psychology and biology. 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   Patient is right-handed.     Family History  Problem Relation Age of Onset  . Mental illness Mother        does not speak to regularly so does not know full history  . Other Father        medical issues, divorce related  . Other Brother        ptsd- fully discharged medically disabled before age 49  . Heart murmur Brother   . Cancer Paternal Grandfather      Current Outpatient Medications on File Prior to Visit  Medication Sig Dispense Refill  . albuterol (2.5 MG/3ML) 0.083% NEBU 3 mL, albuterol (5 MG/ML) 0.5% NEBU 0.5 mL Inhale into the lungs daily as needed.    Marland Kitchen albuterol (PROAIR HFA) 108 (90 Base) MCG/ACT inhaler Inhale 2 puffs into the lungs every 6 (six) hours as needed for wheezing or shortness of breath.    . ALPRAZolam (XANAX) 0.5 MG tablet Take 0.5 mg by mouth as needed for anxiety.     . Armodafinil 150 MG tablet Take 1 tablet (150 mg total) by mouth daily. (Patient taking differently: Take 150 mg by mouth as needed. ) 30 tablet 4  . busPIRone (BUSPAR) 30 MG tablet 30 mg 2 (two) times daily.    Marland Kitchen EPIPEN 2-PAK 0.3 MG/0.3ML SOAJ injection as needed.    . lamoTRIgine (LAMICTAL) 150 MG tablet Take 150 mg by mouth 2 (two) times daily.     Marland Kitchen levocetirizine (XYZAL) 5 MG tablet Take 5 mg by mouth every evening.    . Magnesium 200 MG CHEW Chew by mouth daily.    . montelukast (SINGULAIR) 10 MG tablet Take 10 mg by mouth at bedtime.    . Multiple Vitamins-Minerals (HAIR SKIN AND NAILS FORMULA PO) Take 1 tablet by mouth daily.     . ondansetron (ZOFRAN) 4 MG tablet as needed.     . predniSONE (DELTASONE) 20 MG tablet Take 2 pills for 3 days, 1 pill for 4 days 10 tablet 0  . propranolol (INDERAL) 20 MG tablet Take 1 tablet (20 mg total) by mouth 2 (two) times daily. 60 tablet 2  . Sodium Oxybate 500 MG/ML SOLN Take 3.5gms at bedtime. Take 4.5gms 2 hours later. Total of 8gms per night. Dispense 399ml. 360 mL 5  . SUMAtriptan (IMITREX) 100 MG tablet TAKE ONE-HALF TO  ONE TABLET BY MOUTH EVERY 2 HOURS AS NEEDED  FOR MIGRAINE, MAY REPEAT IN 2 HOURS IF HEADACHE RECURS 10 tablet 0   Current Facility-Administered Medications on File Prior to Visit  Medication Dose Route Frequency Provider Last Rate Last Dose  . diclofenac sodium (VOLTAREN) 1 % transdermal gel 2 g  2 g Topical QID PRN Aundra Dubin, PA-C        Cardiovascular studies:  Echocardiogram 09/03/2019: Left ventricle cavity is normal in size. Normal left ventricular wall thickness. Normal LV systolic function with EF 55%. Normal global wall motion. Normal diastolic filling pattern. Mild tricuspid regurgitation.  No evidence of pulmonary hypertension.  Holter monitor 07/29/2019:  Dominant rhythm sinus.  39-119 bpm. Avg HR 70 bpm.  Rare PAC/PVC's seen. No other arrhythmia.   EKG 08/27/2019: Sinus tachycardia 114 bpm.  Anterior and inferior T wave inversion. Cannot exclude ischemia. Unchanged compared to EKG on 08/25/2019. New since EKG's in 03/2018.  Recent labs: Results for DOCIA, MAGNIFICO (MRN [id number]) as of 09/22/2019 08:03  Ref. Range 08/27/2019 11:07  Troponin I Latest Ref Range: 0.00 - 0.04 ng/mL <0.01   Results for REBBIE, HIRT (MRN [id number]) as of 09/22/2019 08:03  Ref. Range 08/23/2019 00:00 08/27/2019 11:07  D-dimer Latest Ref Range: 0.00 - 0.49 mg/L FEU 158 0.29   Results for JERMANIE, NESMITH (MRN [id number]) as of 08/27/2019 05:44  Ref. Range 07/10/2019 10:08  Sodium Latest Ref Range: 134 - 144 mmol/L 142  Potassium Latest Ref Range: 3.5 - 5.2 mmol/L 5.0  Chloride Latest Ref Range: 96 - 106 mmol/L 102  CO2 Latest Ref Range: 20 - 29 mmol/L 23  Glucose Latest Ref Range: 65 - 99 mg/dL 104 (H)  BUN Latest Ref Range: 6 - 24 mg/dL 8  Creatinine Latest Ref Range: 0.57 - 1.00 mg/dL 0.96  Calcium Latest Ref Range: 8.7 - 10.2 mg/dL 10.3 (H)  BUN/Creatinine Ratio Latest Ref Range: 9 - 23  8 (L)  Alkaline Phosphatase Latest Ref Range: 39 - 117 IU/L 91  Albumin Latest Ref Range: 3.8 -  4.8 g/dL 5.2 (H)  Albumin/Globulin Ratio Latest Ref Range: 1.2 - 2.2  2.6 (H)  AST Latest Ref Range: 0 - 40 IU/L 17  ALT Latest Ref Range: 0 - 32 IU/L 15  Total Protein Latest Ref Range: 6.0 - 8.5 g/dL 7.2  Total Bilirubin Latest Ref Range: 0.0 - 1.2 mg/dL 0.7  GFR, Est Non African American Latest Ref Range: >59 mL/min/1.73 74  GFR, Est African American Latest Ref Range: >59 mL/min/1.73 86   Results for KENNETHIA, LYKES (MRN [id number]) as of 08/27/2019 05:44  Ref. Range 07/10/2019 10:08  TSH Latest Ref Range: 0.450 - 4.500 uIU/mL 1.600  T4,Free(Direct) Latest Ref Range: 0.82 - 1.77 ng/dL 1.26    Results for CAILEEN, SALAS (MRN [id number]) as of 08/27/2019 05:44  Ref. Range 07/02/2018 10:50  WBC Latest Ref Range: 3.4 - 10.8 x10E3/uL 7.3  RBC Latest Ref Range: 3.77 - 5.28 x10E6/uL 4.59  Hemoglobin Latest Ref Range: 11.1 - 15.9 g/dL 14.7  HCT Latest Ref Range: 34.0 - 46.6 % 44.7  MCV Latest Ref Range: 79 - 97 fL 97  MCH Latest Ref Range: 26.6 - 33.0 pg 32.0  MCHC Latest Ref Range: 31.5 - 35.7 g/dL 32.9  RDW Latest Ref Range: 12.3 - 15.4 % 12.6  Platelets Latest Ref Range: 150 - 450 x10E3/uL 369    Review of Systems  Constitution: Negative for decreased appetite, malaise/fatigue, weight gain and weight loss.  HENT: Negative for congestion.   Eyes:  Negative for visual disturbance.  Cardiovascular: Positive for palpitations. Negative for chest pain, dyspnea on exertion, leg swelling and syncope.  Respiratory: Negative for cough.   Endocrine: Negative for cold intolerance.  Hematologic/Lymphatic: Does not bruise/bleed easily.  Skin: Negative for itching and rash.  Musculoskeletal: Negative for myalgias.  Gastrointestinal: Negative for abdominal pain, nausea and vomiting.  Genitourinary: Negative for dysuria.  Neurological: Negative for dizziness and weakness.  Psychiatric/Behavioral: The patient is nervous/anxious.   All other systems reviewed and are negative.       *** There  were no vitals filed for this visit.  *** There is no height or weight on file to calculate BMI. There were no vitals filed for this visit.   Objective:   Physical Exam  Constitutional: She is oriented to person, place, and time. She appears well-developed and well-nourished. No distress.  HENT:  Head: Normocephalic and atraumatic.  Eyes: Pupils are equal, round, and reactive to light. Conjunctivae are normal.  Neck: No JVD present.  Cardiovascular: Regular rhythm and intact distal pulses. Tachycardia present.  No murmur heard. Pulmonary/Chest: Effort normal and breath sounds normal. She has no wheezes. She has no rales.  Abdominal: Soft. Bowel sounds are normal. There is no rebound.  Musculoskeletal:        General: No edema.  Lymphadenopathy:    She has no cervical adenopathy.  Neurological: She is alert and oriented to person, place, and time. No cranial nerve deficit.  Skin: Skin is warm and dry.  Psychiatric:  Anxious  Nursing note and vitals reviewed.         Assessment & Recommendations:   38 y.o. Caucasian female with bipolar disorder with depression, anxiety, referred for evaluation of palpitations.  Palpitations, tachycardia: She has sinus tachycardia at rest, and also complains of palpitations at other times.  Most likely etiology is anxiety and perhaps withdrawal from Xanax.  However, other secondary causes of sinus tachycardia need to be excluded.  TSH is normal.  I will obtain a d-dimer.  She has T wave inversions in inferior and anteroseptal leads on EKG finding tachycardia.  At present, she has not reported any chest pain.  Nonetheless, pending records from [facility], I will check a one-time troponin.  I will obtain an echocardiogram to rule out any structural cardiac abnormalities and 24 Holter monitor to assess her baseline resting heart rate as well as possible arrhythmias.  Given her anxiety and tachycardia, have started on propranolol 20 mg twice daily  and encouraged her to talk to her psychiatrist regarding management of her medications.    Nigel Mormon, MD [facility] Pager: [phone] Office: [phone] If no answer Cell [phone]

## 2019-09-22 NOTE — Telephone Encounter (Signed)
Please read

## 2019-09-24 NOTE — Telephone Encounter (Signed)
Unlikely cardiac. Nonetheless, will proceed with stress test as scheduled.  Please know that I may not be able to reply to every MyChart message in realtime, given the nature of the messaging service. These questions are best addressed through office visit.

## 2019-09-24 NOTE — Telephone Encounter (Signed)
Please read

## 2019-09-26 ENCOUNTER — Other Ambulatory Visit: Payer: Self-pay

## 2019-09-26 ENCOUNTER — Ambulatory Visit (INDEPENDENT_AMBULATORY_CARE_PROVIDER_SITE_OTHER): Payer: BC Managed Care – PPO | Admitting: Physician Assistant

## 2019-09-26 ENCOUNTER — Encounter: Payer: Self-pay | Admitting: Physician Assistant

## 2019-09-26 VITALS — BP 110/74 | HR 82 | Temp 98.2°F | Ht 62.0 in | Wt 146.5 lb

## 2019-09-26 DIAGNOSIS — H6983 Other specified disorders of Eustachian tube, bilateral: Secondary | ICD-10-CM | POA: Diagnosis not present

## 2019-09-26 MED ORDER — AZELASTINE-FLUTICASONE 137-50 MCG/ACT NA SUSP: 1.0000 | Freq: Two times a day (BID) | NASAL | 0 refills | Status: DC

## 2019-09-26 NOTE — Progress Notes (Signed)
Debra Nelson is a 38 y.o. female here for a new problem.  I acted as a Education administrator for [employer], PA-C Anselmo Pickler, LPN  History of Present Illness:   Chief Complaint  Patient presents with  . Otalgia    HPI   Otalgia Pt c/o bilateral ear pain x 3 weeks. No drainage from her ear.  She has tried: Astelin nasal spray, Flonase nasal spray, Afrin nasal spray, she is on antihistamine Xyzal, and she recently completed a course of prednisone for arm pain and numbness/tingling.  She reports that she is having some intermittent dizziness when she rolls over in the bed.  She denies: Nasal drainage, sore throat, fever, chills, cough, bloody nose.   Past Medical History:  Diagnosis Date  . Asthma   . Bipolar disorder with depression ([place])    and PTSD  . Chicken pox   . Chronic migraine w/o aura, not intractable, w stat migr   . Deviated septum   . DJD (degenerative joint disease)    Severe of the right knee-Dr Creighton-ortho and Dr Gean Birchwood, PA-preferred pain managment  . GERD (gastroesophageal reflux disease)   . Narcolepsy   . PTSD (post-traumatic stress disorder)   . Seasonal allergic rhinitis      Social History   Socioeconomic History  . Marital status: Married    Spouse name: Debra Nelson  . Number of children: 3  . Years of education: College  . Highest education level: Not on file  Occupational History    Employer: OTHER  Social Needs  . Financial resource strain: Not on file  . Food insecurity    Worry: Not on file    Inability: Not on file  . Transportation needs    Medical: Not on file    Non-medical: Not on file  Tobacco Use  . Smoking status: Never Smoker  . Smokeless tobacco: Never Used  Substance and Sexual Activity  . Alcohol use: No  . Drug use: No  . Sexual activity: Yes    Partners: Male    Birth control/protection: Pill  Lifestyle  . Physical activity    Days per week: Not on file    Minutes per session: Not on file  . Stress: Not on  file  Relationships  . Social Herbalist on phone: Not on file    Gets together: Not on file    Attends religious service: Not on file    Active member of club or organization: Not on file    Attends meetings of clubs or organizations: Not on file    Relationship status: Not on file  . Intimate partner violence    Fear of current or ex partner: Not on file    Emotionally abused: Not on file    Physically abused: Not on file    Forced sexual activity: Not on file  Other Topics Concern  . Not on file  Social History Narrative   Patient is married (Debra Nelson) and lives at home with her family   4 adults - 2 kids 50% of the time (1 son, 1 daughter). 2 older children (one age 6 from rape, then 67 year old- does not get to see- related to bipolar) Moved in with in laws to help them      Patient has 4 children (3 of her own and 1 step son).    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   Patient is right-handed.    Past Surgical History:  Procedure Laterality Date  . knee surgery x5     08,11, 12, 13 x2- R knee  . right foot surgery     arthritis/bone spurus. plate placed.   . WISDOM TOOTH EXTRACTION      Family History  Problem Relation Age of Onset  . Mental illness Mother        does not speak to regularly so does not know full history  . Other Father        medical issues, divorce related  . Other Brother        ptsd- fully discharged medically disabled before age 14  . Heart murmur Brother   . Cancer Paternal Grandfather     Allergies  Allergen Reactions  . Other Other (See Comments)  . Phenergan [Promethazine Hcl]     IV causes muscle tics, may take oral, or give benadryl prior to IV  . Promethazine Other (See Comments)    She cannot take IV phenergan unless this it is given with Benadryl.  . Promethazine Hcl Other (See Comments)    IV causes muscle tics,  may take oral, or give benadryl prior to IV  . Topamax [Topiramate] Nausea And Vomiting    confusion  . Klonopin [Clonazepam] Nausea Only    Current Medications:   Current Outpatient Medications:  .  albuterol (2.5 MG/3ML) 0.083% NEBU 3 mL, albuterol (5 MG/ML) 0.5% NEBU 0.5 mL, Inhale into the lungs daily as needed., Disp: , Rfl:  .  albuterol (PROAIR HFA) 108 (90 Base) MCG/ACT inhaler, Inhale 2 puffs into the lungs every 6 (six) hours as needed for wheezing or shortness of breath., Disp: , Rfl:  .  ALPRAZolam (XANAX) 0.5 MG tablet, Take 0.5 mg by mouth as needed for anxiety. , Disp: , Rfl:  .  Armodafinil 150 MG tablet, Take 1 tablet (150 mg total) by mouth daily. (Patient taking differently: Take 150 mg by mouth as needed. ), Disp: 30 tablet, Rfl: 4 .  busPIRone (BUSPAR) 30 MG tablet, 30 mg 2 (two) times daily., Disp: , Rfl:  .  EPIPEN 2-PAK 0.3 MG/0.3ML SOAJ injection, as needed., Disp: , Rfl:  .  Fluticasone Furoate (ARNUITY ELLIPTA) 50 MCG/ACT AEPB, Inhale 1 each into the lungs daily., Disp: , Rfl:  .  lamoTRIgine (LAMICTAL) 150 MG tablet, Take 150 mg by mouth 2 (two) times daily. , Disp: , Rfl:  .  levocetirizine (XYZAL) 5 MG tablet, Take 5 mg by mouth every evening., Disp: , Rfl:  .  Magnesium 200 MG CHEW, Chew by mouth daily., Disp: , Rfl:  .  montelukast (SINGULAIR) 10 MG tablet, Take 10 mg by mouth at bedtime., Disp: , Rfl:  .  Multiple Vitamins-Minerals (HAIR SKIN AND NAILS FORMULA PO), Take 1 tablet by mouth daily. , Disp: , Rfl:  .  ondansetron (ZOFRAN) 4 MG tablet, as needed. , Disp: , Rfl:  .  predniSONE (DELTASONE) 20 MG tablet, Take 2 pills for 3 days, 1 pill for 4 days, Disp: 10 tablet, Rfl: 0 .  propranolol (INDERAL) 20 MG tablet, Take 1 tablet (20 mg total) by mouth 2 (two) times daily., Disp: 60 tablet, Rfl: 2 .  Sodium Oxybate 500 MG/ML SOLN, Take 3.5gms at bedtime. Take 4.5gms 2 hours later. Total of 8gms per night. Dispense 339ml., Disp: 360 mL, Rfl: 5 .  SUMAtriptan  (IMITREX) 100  MG tablet, TAKE 1/2 TO 1  TABLET BY MOUTH AT ONSET OF HEADACHE; MAY REPEAT ONE TABLET IN 2 HOURS IF NEEDED., Disp: 10 tablet, Rfl: 5 .  Azelastine-Fluticasone 137-50 MCG/ACT SUSP, Place 1 spray into the nose 2 (two) times daily., Disp: 23 g, Rfl: 0  Current Facility-Administered Medications:  .  diclofenac sodium (VOLTAREN) 1 % transdermal gel 2 g, 2 g, Topical, QID PRN, Aundra Dubin, PA-C   Review of Systems:   ROS  Negative unless otherwise specified per HPI.  Vitals:   Vitals:   09/26/19 0857  BP: 110/74  Pulse: 82  Temp: 98.2 F (36.8 C)  TempSrc: Temporal  SpO2: 97%  Weight: 146 lb 8 oz (66.5 kg)  Height: 5\' 2"  (1.575 m)     Body mass index is 26.8 kg/m.  Physical Exam:   Physical Exam Vitals signs and nursing note reviewed.  Constitutional:      General: She is not in acute distress.    Appearance: She is well-developed. She is not ill-appearing or toxic-appearing.  HENT:     Head: Normocephalic and atraumatic.     Right Ear: Ear canal and external ear normal. A middle ear effusion (clear) is present. Tympanic membrane is not erythematous, retracted or bulging.     Left Ear: Ear canal and external ear normal. A middle ear effusion is present. Tympanic membrane is not erythematous, retracted or bulging.     Nose: Nose normal.     Right Sinus: No maxillary sinus tenderness or frontal sinus tenderness.     Left Sinus: No maxillary sinus tenderness or frontal sinus tenderness.     Mouth/Throat:     Pharynx: Uvula midline. No posterior oropharyngeal erythema.  Eyes:     General: Lids are normal.     Conjunctiva/sclera: Conjunctivae normal.  Neck:     Trachea: Trachea normal.  Cardiovascular:     Rate and Rhythm: Normal rate and regular rhythm.     Heart sounds: Normal heart sounds, S1 normal and S2 normal.  Pulmonary:     Effort: Pulmonary effort is normal.     Breath sounds: Normal breath sounds. No decreased breath sounds, wheezing, rhonchi or  rales.  Lymphadenopathy:     Cervical: No cervical adenopathy.  Skin:    General: Skin is warm and dry.  Neurological:     Mental Status: She is alert.  Psychiatric:        Speech: Speech normal.        Behavior: Behavior normal. Behavior is cooperative.     Assessment and Plan:   Yuvonne was seen today for otalgia.  Diagnoses and all orders for this visit:  Dysfunction of both eustachian tubes  Other orders -     Azelastine-Fluticasone 137-50 MCG/ACT SUSP; Place 1 spray into the nose 2 (two) times daily.   No red flags on exam today.  I do think she has dysfunction of both eustachian tubes, right greater than left.  She has tried several remedies without any improvement of symptoms, will trial Dymista nasal spray.  I did discuss with her that if this does not help with her symptoms that she will need to be referred to ENT.  Marland Kitchen Reviewed expectations re: course of current medical issues. . Discussed self-management of symptoms. . Outlined signs and symptoms indicating need for more acute intervention. . Patient verbalized understanding and all questions were answered. . See orders for this visit as documented in the electronic medical record. . Patient received an  After-Visit Summary.  CMA or LPN served as scribe during this visit. History, Physical, and Plan performed by medical provider. The above documentation has been reviewed and is accurate and complete.  Inda Coke, PA-C

## 2019-09-26 NOTE — Patient Instructions (Signed)
It was great to see you!  Let's try Dymista nasal spray. If no improvement, please let me know and we will send to ENT.  Take care,  Inda Coke PA-C   Eustachian Tube Dysfunction  Eustachian tube dysfunction refers to a condition in which a blockage develops in the narrow passage that connects the middle ear to the back of the nose (eustachian tube). The eustachian tube regulates air pressure in the middle ear by letting air move between the ear and nose. It also helps to drain fluid from the middle ear space. Eustachian tube dysfunction can affect one or both ears. When the eustachian tube does not function properly, air pressure, fluid, or both can build up in the middle ear. What are the causes? This condition occurs when the eustachian tube becomes blocked or cannot open normally. Common causes of this condition include:  Ear infections.  Colds and other infections that affect the nose, mouth, and throat (upper respiratory tract).  Allergies.  Irritation from cigarette smoke.  Irritation from stomach acid coming up into the esophagus (gastroesophageal reflux). The esophagus is the tube that carries food from the mouth to the stomach.  Sudden changes in air pressure, such as from descending in an airplane or scuba diving.  Abnormal growths in the nose or throat, such as: ? Growths that line the nose (nasal polyps). ? Abnormal growth of cells (tumors). ? Enlarged tissue at the back of the throat (adenoids). What increases the risk? You are more likely to develop this condition if:  You smoke.  You are overweight.  You are a child who has: ? Certain birth defects of the mouth, such as cleft palate. ? Large tonsils or adenoids. What are the signs or symptoms? Common symptoms of this condition include:  A feeling of fullness in the ear.  Ear pain.  Clicking or popping noises in the ear.  Ringing in the ear.  Hearing loss.  Loss of balance.  Dizziness.  Symptoms may get worse when the air pressure around you changes, such as when you travel to an area of high elevation, fly on an airplane, or go scuba diving. How is this diagnosed? This condition may be diagnosed based on:  Your symptoms.  A physical exam of your ears, nose, and throat.  Tests, such as those that measure: ? The movement of your eardrum (tympanogram). ? Your hearing (audiometry). How is this treated? Treatment depends on the cause and severity of your condition.  In mild cases, you may relieve your symptoms by moving air into your ears. This is called "popping the ears."  In more severe cases, or if you have symptoms of fluid in your ears, treatment may include: ? Medicines to relieve congestion (decongestants). ? Medicines that treat allergies (antihistamines). ? Nasal sprays or ear drops that contain medicines that reduce swelling (steroids). ? A procedure to drain the fluid in your eardrum (myringotomy). In this procedure, a small tube is placed in the eardrum to:  Drain the fluid.  Restore the air in the middle ear space. ? A procedure to insert a balloon device through the nose to inflate the opening of the eustachian tube (balloon dilation). Follow these instructions at home: Lifestyle  Do not do any of the following until your health care provider approves: ? Travel to high altitudes. ? Fly in airplanes. ? Work in a Pension scheme manager or room. ? Scuba dive.  Do not use any products that contain nicotine or tobacco, such as cigarettes  and e-cigarettes. If you need help quitting, ask your health care provider.  Keep your ears dry. Wear fitted earplugs during showering and bathing. Dry your ears completely after. General instructions  Take over-the-counter and prescription medicines only as told by your health care provider.  Use techniques to help pop your ears as recommended by your health care provider. These may include: ? Chewing gum. ? Yawning.  ? Frequent, forceful swallowing. ? Closing your mouth, holding your nose closed, and gently blowing as if you are trying to blow air out of your nose.  Keep all follow-up visits as told by your health care provider. This is important. Contact a health care provider if:  Your symptoms do not go away after treatment.  Your symptoms come back after treatment.  You are unable to pop your ears.  You have: ? A fever. ? Pain in your ear. ? Pain in your head or neck. ? Fluid draining from your ear.  Your hearing suddenly changes.  You become very dizzy.  You lose your balance. Summary  Eustachian tube dysfunction refers to a condition in which a blockage develops in the eustachian tube.  It can be caused by ear infections, allergies, inhaled irritants, or abnormal growths in the nose or throat.  Symptoms include ear pain, hearing loss, or ringing in the ears.  Mild cases are treated with maneuvers to unblock the ears, such as yawning or ear popping.  Severe cases are treated with medicines. Surgery may also be done (rare). This information is not intended to replace advice given to you by your health care provider. Make sure you discuss any questions you have with your health care provider. Document Released: 12/03/2015 Document Revised: 02/26/2018 Document Reviewed: 02/26/2018 Elsevier Patient Education  2020 [employer].

## 2019-10-01 ENCOUNTER — Ambulatory Visit
Admission: RE | Admit: 2019-10-01 | Discharge: 2019-10-01 | Disposition: A | Discharge status: Home / Self Care | Payer: BC Managed Care – PPO | Source: Ambulatory Visit | Attending: Obstetrics & Gynecology | Admitting: Obstetrics & Gynecology

## 2019-10-01 ENCOUNTER — Other Ambulatory Visit: Payer: Self-pay

## 2019-10-01 DIAGNOSIS — N63 Unspecified lump in unspecified breast: Secondary | ICD-10-CM

## 2019-10-02 ENCOUNTER — Encounter: Payer: Self-pay | Admitting: Family Medicine

## 2019-10-02 NOTE — Telephone Encounter (Signed)
Please check with PCP.

## 2019-10-02 NOTE — Telephone Encounter (Signed)
Please read

## 2019-10-06 ENCOUNTER — Ambulatory Visit (INDEPENDENT_AMBULATORY_CARE_PROVIDER_SITE_OTHER): Payer: BC Managed Care – PPO

## 2019-10-06 ENCOUNTER — Encounter: Payer: Self-pay | Admitting: Cardiology

## 2019-10-06 ENCOUNTER — Ambulatory Visit (INDEPENDENT_AMBULATORY_CARE_PROVIDER_SITE_OTHER): Payer: BC Managed Care – PPO | Admitting: Cardiology

## 2019-10-06 ENCOUNTER — Telehealth: Payer: Self-pay | Admitting: Neurology

## 2019-10-06 ENCOUNTER — Other Ambulatory Visit: Payer: Self-pay

## 2019-10-06 VITALS — BP 125/88 | HR 115

## 2019-10-06 DIAGNOSIS — R002 Palpitations: Secondary | ICD-10-CM | POA: Diagnosis not present

## 2019-10-06 DIAGNOSIS — R0789 Other chest pain: Secondary | ICD-10-CM | POA: Insufficient documentation

## 2019-10-06 DIAGNOSIS — R0602 Shortness of breath: Secondary | ICD-10-CM

## 2019-10-06 NOTE — Progress Notes (Signed)
Patient referred by Marin Olp, MD for palpitations  Subjective:   Debra Nelson, female    DOB: 09-Feb-1981, 38 y.o.   MRN: [id number]   Chief Complaint  Patient presents with  . Chest Pain     HPI  38 y.o. Caucasian female with bipolar disorder with depression, anxiety, referred for evaluation of palpitations.  Patient continues to have episodes of palpitations when she walks.  Cardiac work-up, detailed below, is essentially unremarkable.   Past Medical History:  Diagnosis Date  . Asthma   . Bipolar disorder with depression ([place])    and PTSD  . Chicken pox   . Chronic migraine w/o aura, not intractable, w stat migr   . Deviated septum   . DJD (degenerative joint disease)    Severe of the right knee-Dr Creighton-ortho and Dr Gean Birchwood, PA-preferred pain managment  . GERD (gastroesophageal reflux disease)   . Narcolepsy   . PTSD (post-traumatic stress disorder)   . Seasonal allergic rhinitis      Past Surgical History:  Procedure Laterality Date  . knee surgery x5     08,11, 12, 13 x2- R knee  . right foot surgery     arthritis/bone spurus. plate placed.   . WISDOM TOOTH EXTRACTION       Social History   Socioeconomic History  . Marital status: Married    Spouse name: Debra Nelson  . Number of children: 3  . Years of education: College  . Highest education level: Not on file  Occupational History    Employer: OTHER  Social Needs  . Financial resource strain: Not on file  . Food insecurity    Worry: Not on file    Inability: Not on file  . Transportation needs    Medical: Not on file    Non-medical: Not on file  Tobacco Use  . Smoking status: Never Smoker  . Smokeless tobacco: Never Used  Substance and Sexual Activity  . Alcohol use: No  . Drug use: No  . Sexual activity: Yes    Partners: Male    Birth control/protection: Pill  Lifestyle  . Physical activity    Days per week: Not on file    Minutes per session: Not on file  .  Stress: Not on file  Relationships  . Social Herbalist on phone: Not on file    Gets together: Not on file    Attends religious service: Not on file    Active member of club or organization: Not on file    Attends meetings of clubs or organizations: Not on file    Relationship status: Not on file  . Intimate partner violence    Fear of current or ex partner: Not on file    Emotionally abused: Not on file    Physically abused: Not on file    Forced sexual activity: Not on file  Other Topics Concern  . Not on file  Social History Narrative   Patient is married (Debra Nelson) and lives at home with her family   4 adults - 2 kids 50% of the time (1 son, 1 daughter). 2 older children (one age 24 from rape, then 59 year old- does not get to see- related to bipolar) Moved in with in laws to help them      Patient has 4 children (3 of her own and 1 step son).    Patient has a college education. Psychology and biology. 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   Patient is right-handed.     Family History  Problem Relation Age of Onset  . Mental illness Mother        does not speak to regularly so does not know full history  . Other Father        medical issues, divorce related  . Other Brother        ptsd- fully discharged medically disabled before age 81  . Heart murmur Brother   . Cancer Paternal Grandfather      Current Outpatient Medications on File Prior to Visit  Medication Sig Dispense Refill  . albuterol (2.5 MG/3ML) 0.083% NEBU 3 mL, albuterol (5 MG/ML) 0.5% NEBU 0.5 mL Inhale into the lungs daily as needed.    Marland Kitchen albuterol (PROAIR HFA) 108 (90 Base) MCG/ACT inhaler Inhale 2 puffs into the lungs every 6 (six) hours as needed for wheezing or shortness of breath.    . ALPRAZolam (XANAX) 0.5 MG tablet Take 0.5 mg by mouth as needed for anxiety.     . Armodafinil 150 MG tablet Take 1 tablet (150 mg total) by mouth daily.  (Patient taking differently: Take 150 mg by mouth as needed. ) 30 tablet 4  . Azelastine-Fluticasone 137-50 MCG/ACT SUSP Place 1 spray into the nose 2 (two) times daily. 23 g 0  . busPIRone (BUSPAR) 30 MG tablet 30 mg 2 (two) times daily.    Marland Kitchen EPIPEN 2-PAK 0.3 MG/0.3ML SOAJ injection as needed.    . Fluticasone Furoate (ARNUITY ELLIPTA) 50 MCG/ACT AEPB Inhale 1 each into the lungs daily.    Marland Kitchen lamoTRIgine (LAMICTAL) 150 MG tablet Take 150 mg by mouth 2 (two) times daily.     Marland Kitchen levocetirizine (XYZAL) 5 MG tablet Take 5 mg by mouth every evening.    . montelukast (SINGULAIR) 10 MG tablet Take 10 mg by mouth at bedtime.    . Multiple Vitamins-Minerals (HAIR SKIN AND NAILS FORMULA PO) Take 1 tablet by mouth daily.     . ondansetron (ZOFRAN) 4 MG tablet as needed.     . propranolol (INDERAL) 20 MG tablet Take 1 tablet (20 mg total) by mouth 2 (two) times daily. 60 tablet 2  . Sodium Oxybate 500 MG/ML SOLN Take 3.5gms at bedtime. Take 4.5gms 2 hours later. Total of 8gms per night. Dispense 324ml. 360 mL 5  . SUMAtriptan (IMITREX) 100 MG tablet TAKE 1/2 TO 1  TABLET BY MOUTH AT ONSET OF HEADACHE; MAY REPEAT ONE TABLET IN 2 HOURS IF NEEDED. 10 tablet 5   Current Facility-Administered Medications on File Prior to Visit  Medication Dose Route Frequency Provider Last Rate Last Dose  . diclofenac sodium (VOLTAREN) 1 % transdermal gel 2 g  2 g Topical QID PRN Aundra Dubin, PA-C        Cardiovascular studies:  Exercise treadmill stress test 10/06/2019: Exercise treadmill stress test performed using Bruce protocol.  Patient reached 4.7 METS, and 98% of age predicted maximum heart rate.  Exercise capacity was low.  No chest pain reported.  Exaggerated heart rate response, normal hemodynamic response. Stress EKG revealed no ischemic changes. Normal exercise treadmill stress test with low exercise capacity.  Echocardiogram 09/03/2019: Left ventricle cavity is normal in size. Normal left ventricular wall  thickness. Normal LV systolic function with EF 55%. Normal global wall motion. Normal diastolic filling pattern. Mild tricuspid regurgitation.  No evidence of pulmonary hypertension.  Holter monitor  07/29/2019: Dominant rhythm sinus.  Heart rate 39-119 bpm.  Average heart rate 70 bpm. Rare PACs/PVCs seen.  No other arrhythmias seen.   EKG 08/27/2019: Sinus tachycardia 114 bpm.  Anterior and inferior T wave inversion. Cannot exclude ischemia. Unchanged compared to EKG on 08/25/2019. New since EKG's in 03/2018.  Recent labs: Results for LANCE, KUMMER (MRN [id number]) as of 08/27/2019 05:44  Ref. Range 07/10/2019 10:08  Sodium Latest Ref Range: 134 - 144 mmol/L 142  Potassium Latest Ref Range: 3.5 - 5.2 mmol/L 5.0  Chloride Latest Ref Range: 96 - 106 mmol/L 102  CO2 Latest Ref Range: 20 - 29 mmol/L 23  Glucose Latest Ref Range: 65 - 99 mg/dL 104 (H)  BUN Latest Ref Range: 6 - 24 mg/dL 8  Creatinine Latest Ref Range: 0.57 - 1.00 mg/dL 0.96  Calcium Latest Ref Range: 8.7 - 10.2 mg/dL 10.3 (H)  BUN/Creatinine Ratio Latest Ref Range: 9 - 23  8 (L)  Alkaline Phosphatase Latest Ref Range: 39 - 117 IU/L 91  Albumin Latest Ref Range: 3.8 - 4.8 g/dL 5.2 (H)  Albumin/Globulin Ratio Latest Ref Range: 1.2 - 2.2  2.6 (H)  AST Latest Ref Range: 0 - 40 IU/L 17  ALT Latest Ref Range: 0 - 32 IU/L 15  Total Protein Latest Ref Range: 6.0 - 8.5 g/dL 7.2  Total Bilirubin Latest Ref Range: 0.0 - 1.2 mg/dL 0.7  GFR, Est Non African American Latest Ref Range: >59 mL/min/1.73 74  GFR, Est African American Latest Ref Range: >59 mL/min/1.73 86   Results for DREENA, BIEDRON (MRN [id number]) as of 08/27/2019 05:44  Ref. Range 07/10/2019 10:08  TSH Latest Ref Range: 0.450 - 4.500 uIU/mL 1.600  T4,Free(Direct) Latest Ref Range: 0.82 - 1.77 ng/dL 1.26    Results for KAMIL, MCNEELEY (MRN [id number]) as of 08/27/2019 05:44  Ref. Range 07/02/2018 10:50  WBC Latest Ref Range: 3.4 - 10.8 x10E3/uL 7.3  RBC Latest Ref  Range: 3.77 - 5.28 x10E6/uL 4.59  Hemoglobin Latest Ref Range: 11.1 - 15.9 g/dL 14.7  HCT Latest Ref Range: 34.0 - 46.6 % 44.7  MCV Latest Ref Range: 79 - 97 fL 97  MCH Latest Ref Range: 26.6 - 33.0 pg 32.0  MCHC Latest Ref Range: 31.5 - 35.7 g/dL 32.9  RDW Latest Ref Range: 12.3 - 15.4 % 12.6  Platelets Latest Ref Range: 150 - 450 x10E3/uL 369    Review of Systems  Constitution: Negative for decreased appetite, malaise/fatigue, weight gain and weight loss.  HENT: Negative for congestion.   Eyes: Negative for visual disturbance.  Cardiovascular: Positive for palpitations. Negative for chest pain, dyspnea on exertion, leg swelling and syncope.  Respiratory: Negative for cough.   Endocrine: Negative for cold intolerance.  Hematologic/Lymphatic: Does not bruise/bleed easily.  Skin: Negative for itching and rash.  Musculoskeletal: Negative for myalgias.  Gastrointestinal: Negative for abdominal pain, nausea and vomiting.  Genitourinary: Negative for dysuria.  Neurological: Negative for dizziness and weakness.  Psychiatric/Behavioral: The patient is nervous/anxious.   All other systems reviewed and are negative.        Vitals:   10/06/19 1415  BP: 125/88  Pulse: (!) 115  SpO2: 100%      Objective:   Physical Exam  Constitutional: She is oriented to person, place, and time. She appears well-developed and well-nourished. No distress.  HENT:  Head: Normocephalic and atraumatic.  Eyes: Pupils are equal, round, and reactive to light. Conjunctivae are normal.  Neck: No JVD present.  Cardiovascular:  Regular rhythm and intact distal pulses. Tachycardia present.  No murmur heard. Pulmonary/Chest: Effort normal and breath sounds normal. She has no wheezes. She has no rales.  Abdominal: Soft. Bowel sounds are normal. There is no rebound.  Musculoskeletal:        General: No edema.  Lymphadenopathy:    She has no cervical adenopathy.  Neurological: She is alert and oriented to  person, place, and time. No cranial nerve deficit.  Skin: Skin is warm and dry.  Psychiatric:  Anxious  Nursing note and vitals reviewed.         Assessment & Recommendations:   38 y.o. Caucasian female with bipolar disorder with depression, anxiety, referred for evaluation of palpitations.  Palpitations, tachycardia: Unremarkable cardiac work-up.  I suspect her symptoms are related to anxiety, and deconditioning.  Continue propranolol, if tolerated.  Continue follow-up with psychiatrist.  I will see her on as-needed basis.   Nigel Mormon, MD [facility]. [facility] Pager: [phone] Office: [phone] If no answer Cell [phone]

## 2019-10-06 NOTE — Telephone Encounter (Signed)
Patient called on call physician on Nov 14th 2020,  she has migraine, previously responded well to imitrex, but recently, she is receiving work up for heart palpitation.  I have advised her to hold off imitrex for now, until she complete cardiac work up.

## 2019-10-08 ENCOUNTER — Encounter: Payer: Self-pay | Admitting: Family Medicine

## 2019-10-09 ENCOUNTER — Encounter: Payer: Self-pay | Admitting: Family Medicine

## 2019-10-09 ENCOUNTER — Telehealth: Payer: Self-pay | Admitting: Neurology

## 2019-10-09 NOTE — Telephone Encounter (Signed)
PA completed through [facility] [employer]/cover my meds. KEY: [id number] Will wait for response for the insurance.

## 2019-10-11 NOTE — Progress Notes (Signed)
EKG 10/06/2019: Sinus tachycardia at rate of 108 bpm, normal axis, single PVC.  I will inform the patient began and her symptoms of palpitations may be related to PVCs, however she was also having sinus tachycardia today.  TSH has been normal.  Presently on propranolol, could continue the same.  Could increase the dose.  Message to patient sent on My Chart  Hope you are feeling well. I also reviewed your work up and you are having palpitations probably due to episodes of PVC.  Premature ventricular complexes (PVC) means extra heartbeat from the lower chamber of the heart.  These are very common and are not dangerous.  Extra skipped beat coming from the bottom chamber (ventricle) and mostly are life altering (nuisance) than life threatening and mostly treated by reassurance.  There may not be any specific reasons for this, however patients with excessive caffeine, anxiety, lack of sleep, alcohol or thyroid problems can have these episodes.  Rarely patients with block coronary arteries or family history of heart muscle disease may be the reason.  If more questions, he can discuss with her doctor on office visit.  Please know in the absence of known heart disease, they are benign that means they are life altering (does not feel good when it happens) but not life threatening. Will be happy to see you back and see if there is anything else we can do.  Palpitations, symptoms suggest PAC/PVC: I have reassured the patient. I will emperically try Vit B1 (Thiamine) 50 mg, Vit B6 (Pyrodoxine) 50 mg and Vit B12 (rappid release) 1015mcg, twice daily for palpitations and tachycardia. Consider Fish oil capsule daily with food.   Adrian Prows

## 2019-10-13 ENCOUNTER — Encounter: Payer: BC Managed Care – PPO | Admitting: Podiatry

## 2019-10-13 NOTE — Telephone Encounter (Signed)
PA approved for the patient through [employer] until 10/10/2020.

## 2019-10-13 NOTE — Telephone Encounter (Signed)
From pt

## 2019-10-20 NOTE — Telephone Encounter (Signed)
From pt

## 2019-10-27 ENCOUNTER — Encounter: Payer: Self-pay | Admitting: Podiatry

## 2019-10-27 ENCOUNTER — Ambulatory Visit (INDEPENDENT_AMBULATORY_CARE_PROVIDER_SITE_OTHER): Payer: BC Managed Care – PPO

## 2019-10-27 ENCOUNTER — Ambulatory Visit (INDEPENDENT_AMBULATORY_CARE_PROVIDER_SITE_OTHER): Payer: BC Managed Care – PPO | Admitting: Podiatry

## 2019-10-27 ENCOUNTER — Other Ambulatory Visit: Payer: Self-pay

## 2019-10-27 DIAGNOSIS — M7751 Other enthesopathy of right foot: Secondary | ICD-10-CM

## 2019-10-27 DIAGNOSIS — M2021 Hallux rigidus, right foot: Secondary | ICD-10-CM

## 2019-10-27 DIAGNOSIS — M7752 Other enthesopathy of left foot: Secondary | ICD-10-CM

## 2019-10-27 DIAGNOSIS — M779 Enthesopathy, unspecified: Secondary | ICD-10-CM

## 2019-10-27 MED ORDER — DICLOFENAC SODIUM 75 MG PO TBEC: 75.0000 mg | DELAYED_RELEASE_TABLET | Freq: Two times a day (BID) | ORAL | 2 refills | Status: DC

## 2019-10-27 NOTE — Progress Notes (Signed)
Subjective:   Patient ID: Debra Nelson, female   DOB: 38 y.o.   MRN: [id number]   HPI Patient presents stating that she is still getting some swelling in the big toe joint right and feels like she is getting need additional support.  Does not have the pain she used to have but it is bothersome for her   ROS      Objective:  Physical Exam  Neurovascular status intact with patient's right first MPJ showing good range of motion incision site well-healed and no indications of pain syndrome with mild swelling still noted F2     Assessment:  Appears to be more inflammatory than anything else and I am relatively confident that we are not dealing with a pain syndrome     Plan:  Reviewed x-rays condition and at this point I have recommended orthotics to offload the first metatarsal and to make these in a [place] type with a deep heel seat so as to cut the heel and provide for more offloading.  Patient is casted for functional orthotic today and will be seen back by ped orthotist for dispensing and by me in 8 weeks  X-rays indicate that there is good healing of the site with implant in place

## 2019-10-28 ENCOUNTER — Encounter: Payer: Self-pay | Admitting: Podiatry

## 2019-11-24 ENCOUNTER — Other Ambulatory Visit: Payer: Self-pay | Admitting: Cardiology

## 2019-11-24 DIAGNOSIS — R002 Palpitations: Secondary | ICD-10-CM

## 2019-11-24 DIAGNOSIS — R000 Tachycardia, unspecified: Secondary | ICD-10-CM

## 2019-11-25 ENCOUNTER — Other Ambulatory Visit: Payer: BC Managed Care – PPO | Admitting: Orthotics

## 2019-12-02 ENCOUNTER — Other Ambulatory Visit: Payer: Self-pay | Admitting: Neurology

## 2019-12-02 DIAGNOSIS — G4753 Recurrent isolated sleep paralysis: Secondary | ICD-10-CM

## 2019-12-02 DIAGNOSIS — G47411 Narcolepsy with cataplexy: Secondary | ICD-10-CM

## 2019-12-02 MED ORDER — SODIUM OXYBATE 500 MG/ML PO SOLN: ORAL | 5 refills | Status: DC

## 2019-12-09 ENCOUNTER — Ambulatory Visit (INDEPENDENT_AMBULATORY_CARE_PROVIDER_SITE_OTHER): Payer: BC Managed Care – PPO | Admitting: Family Medicine

## 2019-12-09 ENCOUNTER — Encounter: Payer: Self-pay | Admitting: Family Medicine

## 2019-12-09 VITALS — Temp 97.6°F | Ht 62.0 in | Wt 146.0 lb

## 2019-12-09 DIAGNOSIS — R0981 Nasal congestion: Secondary | ICD-10-CM

## 2019-12-09 DIAGNOSIS — J3489 Other specified disorders of nose and nasal sinuses: Secondary | ICD-10-CM

## 2019-12-09 MED ORDER — AMOXICILLIN-POT CLAVULANATE 875-125 MG PO TABS: 1.0000 | ORAL_TABLET | Freq: Two times a day (BID) | ORAL | 0 refills | Status: AC

## 2019-12-09 NOTE — Progress Notes (Signed)
Phone [phone] Virtual visit via Video note   Subjective:  Chief complaint: Chief Complaint  Patient presents with  . Ear Pain    right    . Sinusitis   This visit type was conducted due to national recommendations for restrictions regarding the COVID-19 Pandemic (e.g. social distancing).  This format is felt to be most appropriate for this patient at this time balancing risks to patient and risks to population by having him in for in person visit.  No physical exam was performed (except for noted visual exam or audio findings with Telehealth visits).    Our team/I connected with [name] at  2:20 PM EST by a video enabled telemedicine application (doxy.me or caregility through epic) and verified that I am speaking with the correct person using two identifiers.  Location patient: [place] Location provider: [facility], office Persons participating in the virtual visit:  patient  Our team/I discussed the limitations of evaluation and management by telemedicine and the availability of in person appointments. In light of current covid-19 pandemic, patient also understands that we are trying to protect them by minimizing in office contact if at all possible.  The patient expressed consent for telemedicine visit and agreed to proceed. Patient understands insurance will be billed.   Past Medical History-  Patient Active Problem List   Diagnosis Date Noted  . Chronic post-traumatic stress disorder (PTSD) 02/26/2017    Priority: High  . Narcolepsy cataplexy syndrome 10/19/2015    Priority: High  . Narcolepsy and cataplexy 03/06/2014    Priority: High  . Bipolar II disorder ([place]) 12/31/2013    Priority: High  . Asthma 10/27/2016    Priority: Medium  . GERD (gastroesophageal reflux disease) 10/27/2016    Priority: Medium  . Intractable migraine with aura with status migrainosus 10/19/2015    Priority: Medium  . Sensorineural hearing loss (SNHL) of both ears 12/12/2016   Priority: Low  . Seasonal allergic rhinitis 05/02/2016    Priority: Low  . Right ear pain 05/02/2016    Priority: Low  . Dysfunction of both eustachian tubes 05/02/2016    Priority: Low  . Chondromalacia of knee 03/07/2013    Priority: Low  . Atypical chest pain 10/06/2019  . Palpitations 08/27/2019  . Sinus tachycardia 08/27/2019  . Shortness of breath 08/27/2019  . Recurrent isolated sleep paralysis 07/02/2018  . Encounter for medication management 07/02/2018  . Menstrual migraine without status migrainosus, not intractable 01/02/2018  . Chronic thumb pain, bilateral 11/23/2017  . Elevated TSH 05/07/2017  . Trigger thumb, left thumb 03/15/2017    Medications- reviewed and updated Current Outpatient Medications  Medication Sig Dispense Refill  . albuterol (2.5 MG/3ML) 0.083% NEBU 3 mL, albuterol (5 MG/ML) 0.5% NEBU 0.5 mL Inhale into the lungs daily as needed.    Marland Kitchen albuterol (PROAIR HFA) 108 (90 Base) MCG/ACT inhaler Inhale 2 puffs into the lungs every 6 (six) hours as needed for wheezing or shortness of breath.    . ALPRAZolam (XANAX) 0.5 MG tablet Take 0.5 mg by mouth as needed for anxiety.     . Armodafinil 150 MG tablet Take 1 tablet (150 mg total) by mouth daily. (Patient taking differently: Take 150 mg by mouth as needed. ) 30 tablet 4  . ARNUITY ELLIPTA 100 MCG/ACT AEPB     . busPIRone (BUSPAR) 30 MG tablet 30 mg 2 (two) times daily.    . diclofenac (VOLTAREN) 75 MG EC tablet Take 1 tablet (75 mg total) by mouth 2 (  two) times daily. 50 tablet 2  . EPIPEN 2-PAK 0.3 MG/0.3ML SOAJ injection as needed.    . Fluticasone Furoate (ARNUITY ELLIPTA) 50 MCG/ACT AEPB Inhale 1 each into the lungs daily.    Marland Kitchen lamoTRIgine (LAMICTAL) 150 MG tablet Take 150 mg by mouth 2 (two) times daily.     Marland Kitchen levocetirizine (XYZAL) 5 MG tablet Take 5 mg by mouth every evening.    . montelukast (SINGULAIR) 10 MG tablet Take 10 mg by mouth at bedtime.    . Multiple Vitamins-Minerals (HAIR SKIN AND NAILS  FORMULA PO) Take 1 tablet by mouth daily.     . propranolol (INDERAL) 20 MG tablet TAKE ONE TABLET BY MOUTH TWICE A DAY 180 tablet 1  . Sodium Oxybate 500 MG/ML SOLN Take 3.5gms at bedtime. Take 4.5gms 2 hours later. Total of 8gms per night. Dispense 351ml. 360 mL 5  . SUMAtriptan (IMITREX) 100 MG tablet TAKE 1/2 TO 1  TABLET BY MOUTH AT ONSET OF HEADACHE; MAY REPEAT ONE TABLET IN 2 HOURS IF NEEDED. 10 tablet 5  . Olopatadine HCl 0.6 % SOLN Two sprays each nostril twice a day per allergist     Current Facility-Administered Medications  Medication Dose Route Frequency Provider Last Rate Last Admin  . diclofenac sodium (VOLTAREN) 1 % transdermal gel 2 g  2 g Topical QID PRN Aundra Dubin, PA-C         Objective:  Temp 97.6 F (36.4 C) (Temporal)   Ht 5\' 2"  (1.575 m)   Wt 146 lb (66.2 kg)   BMI 26.70 kg/m  self reported vitals Gen: NAD, resting comfortably Lungs: nonlabored, normal respiratory rate  Skin: appears dry, no obvious rash    Assessment and Plan   # Ear and sinus pain S:Symptoms started about 6 days ago. She is having right ear pain and pressure off and on. Has also noted pressure behind her eyes. Feels really stuffy each AM and noted drainage most of the day. Getting some cough from drainage in last few days. History of bronchitis and sinus infections so that concerns her. Has some sore throat. Has not been abel to blow out congestion. Some nausea when drainage Is bad. Drainage she notes in her throat has a "nasty" taste to it and notes it is foul smelling.  Symptoms worsened 3 yesterday and slightly better today.   She has been taking nasal spray olopatadine from allergist as well as OTC flonase (does not tolerate rx version), normally allergy medications.   Denies any fever, nausea or vomiting, no loss of taste or smell. She has not been around anyone that is under testing or positive for covid.   Pressure is most bothersome symptom- tylenol and advil with some relief.     Actually saw her daughter's doctor yesterday who looked in her ear and did not see redness but saw some retraction. A/P: Strongly suspect sinusitis.  Per definition since this is only day 6 and without double sickening this is technically a viral sinusitis.  We discussed indications of bacterial infection including double sickening or symptoms over 10 days-I sent her in a prescription for Augmentin to use if either of these occurs.  I also suggested she add plain Mucinex -tolerates prednisone if needed-could use as the next step if not improving -Once again strongly suspect sinusitis but cannot rule out COVID-19-see below  With that being said-patient with symptoms concerning for potential covid 19 Therefore: -information provided on testing scheduling "please text "COVID" to [phone], OR  you can log on to [email or web address] to easily make an on-line appointment. "  - recommended patient watch closely for shortness of breath or confusion or worsening symptoms and if those occur she should contact us immediately  -recommended patient consider purchasing pulse oximeter and if levels 94% or below persistently- seek care at the hospital  -recommended self isolation until negative test  at minimum . Also discussed potential for false negatives and to still be cautious even with negative test  - for quarantine if covid 19 test positive would need to be at least 10 days since first symptom AND at least 24 hours fever free without fever reducing medications AND improvement in respiratory symptoms  - we also discussed close contacts would need 14 day quarantine after last close contact with patient IF patient is positive  -Hopeful results back within 48 hours of test but may take up to a week   Recommended follow up: As needed for acute concerns Future Appointments  Date Time Provider [place]  12/22/2019 11:15 AM Velora Heckler [facility]  12/22/2019 11:30 AM Regal, Tamala Fothergill, DPM  [facility]  01/12/2020  9:30 AM Ward Givens, NP [facility] None    Lab/Order associations:   ICD-10-CM   1. Sinus pressure  J34.89   2. Sinus congestion  R09.81     Meds ordered this encounter  Medications  . amoxicillin-clavulanate (AUGMENTIN) 875-125 MG tablet    Sig: Take 1 tablet by mouth 2 (two) times daily for 7 days.    Dispense:  14 tablet    Refill:  0   Return precautions advised.  Garret Reddish, MD

## 2019-12-09 NOTE — Patient Instructions (Signed)
There are no preventive care reminders to display for this patient.  Depression screen [facility] 2/9 08/25/2019 04/07/2015  Decreased Interest 1 2  Down, Depressed, Hopeless 1 2  PHQ - 2 Score 2 4  Altered sleeping 0 1  Tired, decreased energy 0 3  Change in appetite 1 2  Feeling bad or failure about yourself  0 1  Trouble concentrating 3 1  Moving slowly or fidgety/restless 0 0  Suicidal thoughts 0 2  PHQ-9 Score 6 14  Difficult doing work/chores Somewhat difficult Somewhat difficult    Recommended follow up: No follow-ups on file.

## 2019-12-11 ENCOUNTER — Ambulatory Visit: Payer: BC Managed Care – PPO | Admitting: Family Medicine

## 2019-12-11 ENCOUNTER — Other Ambulatory Visit: Payer: BC Managed Care – PPO

## 2019-12-12 DIAGNOSIS — R002 Palpitations: Secondary | ICD-10-CM

## 2019-12-12 NOTE — Telephone Encounter (Signed)
From Patient

## 2019-12-13 ENCOUNTER — Encounter: Payer: Self-pay | Admitting: Family Medicine

## 2019-12-19 NOTE — Telephone Encounter (Signed)
From patient.

## 2019-12-20 NOTE — Telephone Encounter (Signed)
Please order 4 week event monitor and let patient know.   Thanks MJP

## 2019-12-22 ENCOUNTER — Ambulatory Visit (INDEPENDENT_AMBULATORY_CARE_PROVIDER_SITE_OTHER): Payer: BC Managed Care – PPO

## 2019-12-22 ENCOUNTER — Encounter: Payer: Self-pay | Admitting: Podiatry

## 2019-12-22 ENCOUNTER — Other Ambulatory Visit: Payer: Self-pay

## 2019-12-22 ENCOUNTER — Encounter: Payer: Self-pay | Admitting: Family Medicine

## 2019-12-22 ENCOUNTER — Ambulatory Visit: Payer: BC Managed Care – PPO | Admitting: Podiatry

## 2019-12-22 ENCOUNTER — Ambulatory Visit: Payer: BC Managed Care – PPO | Admitting: Orthotics

## 2019-12-22 VITALS — Temp 97.9°F

## 2019-12-22 DIAGNOSIS — R002 Palpitations: Secondary | ICD-10-CM

## 2019-12-22 DIAGNOSIS — M2021 Hallux rigidus, right foot: Secondary | ICD-10-CM

## 2019-12-22 DIAGNOSIS — M779 Enthesopathy, unspecified: Secondary | ICD-10-CM

## 2019-12-22 NOTE — Telephone Encounter (Signed)
Done

## 2019-12-22 NOTE — Patient Instructions (Signed)

## 2019-12-22 NOTE — Progress Notes (Signed)
Subjective:   Patient ID: Debra Nelson, female   DOB: 39 y.o.   MRN: [id number]   HPI Patient states her foot seems to be feeling quite a bit better but she still has some discomfort and she is also here to pick up orthotics   ROS      Objective:  Physical Exam  Neurovascular status intact negative Bevelyn Buckles' sign noted with motion improving of the first MPJ right with no crepitus of the joint and no discomfort when I move the big toe     Assessment:  Appears to be improving continuously from having had implant procedure with resection of fusion site right first metatarsal phalangeal     Plan:  H&P reviewed condition discussed the continuation of conservative care and orthotics were dispensed with all instructions on usage.  Reappoint to recheck again in 6 weeks or earlier if needed  X-rays indicate the implant is in place and appears to be functioning well

## 2019-12-22 NOTE — Progress Notes (Signed)
Patient p/u f/o however didn't wear appropriate footwear to accommodate such.  Patient advised of break in schedule and to see me if issues develop.

## 2019-12-22 NOTE — Telephone Encounter (Signed)
Patient will be here this afternoon.

## 2019-12-26 MED ORDER — FLUCONAZOLE 150 MG PO TABS: 150.0000 mg | ORAL_TABLET | Freq: Once | ORAL | 0 refills | Status: AC

## 2019-12-26 NOTE — Telephone Encounter (Signed)
From patient.

## 2019-12-29 NOTE — Telephone Encounter (Signed)
From patient.

## 2019-12-29 NOTE — Telephone Encounter (Signed)
Best to take it off. It will obstruct the view. Give her more strips.

## 2019-12-30 ENCOUNTER — Encounter: Payer: Self-pay | Admitting: Family Medicine

## 2019-12-30 NOTE — Telephone Encounter (Signed)
I called and spoke with patient, she said nvmd, her PCP is changing this medication.

## 2019-12-30 NOTE — Telephone Encounter (Signed)
She will be in sometime today, to pick up more strips, they are already at front desk waiting for her.

## 2019-12-30 NOTE — Telephone Encounter (Signed)
Please read thank you

## 2020-01-01 ENCOUNTER — Other Ambulatory Visit: Payer: Self-pay

## 2020-01-01 ENCOUNTER — Ambulatory Visit (INDEPENDENT_AMBULATORY_CARE_PROVIDER_SITE_OTHER): Payer: BC Managed Care – PPO

## 2020-01-01 ENCOUNTER — Ambulatory Visit (INDEPENDENT_AMBULATORY_CARE_PROVIDER_SITE_OTHER): Payer: BC Managed Care – PPO | Admitting: Family Medicine

## 2020-01-01 ENCOUNTER — Encounter: Payer: Self-pay | Admitting: Family Medicine

## 2020-01-01 VITALS — BP 128/86 | HR 103 | Temp 97.9°F | Ht 62.0 in | Wt 146.6 lb

## 2020-01-01 DIAGNOSIS — R0602 Shortness of breath: Secondary | ICD-10-CM

## 2020-01-01 DIAGNOSIS — R6889 Other general symptoms and signs: Secondary | ICD-10-CM | POA: Diagnosis not present

## 2020-01-01 LAB — COMPREHENSIVE METABOLIC PANEL
ALT: 12 U/L (ref 0–35)
AST: 13 U/L (ref 0–37)
Albumin: 4.7 g/dL (ref 3.5–5.2)
Alkaline Phosphatase: 78 U/L (ref 39–117)
BUN: 11 mg/dL (ref 6–23)
CO2: 30 mEq/L (ref 19–32)
Calcium: 10.2 mg/dL (ref 8.4–10.5)
Chloride: 102 mEq/L (ref 96–112)
Creatinine, Ser: 0.78 mg/dL (ref 0.40–1.20)
GFR: 81.44 mL/min (ref >60.00)
Glucose, Bld: 104 mg/dL — ABNORMAL HIGH (ref 70–99)
Potassium: 4.3 mEq/L (ref 3.5–5.1)
Sodium: 139 mEq/L (ref 135–145)
Total Bilirubin: 0.9 mg/dL (ref 0.2–1.2)
Total Protein: 6.9 g/dL (ref 6.0–8.3)

## 2020-01-01 LAB — CBC WITH DIFFERENTIAL/PLATELET
Basophils Absolute: 0.1 10*3/uL (ref 0.0–0.1)
Basophils Relative: 0.5 % (ref 0.0–3.0)
Eosinophils Absolute: 0 10*3/uL (ref 0.0–0.7)
Eosinophils Relative: 0.4 % (ref 0.0–5.0)
HCT: 43.3 % (ref 36.0–46.0)
Hemoglobin: 14.6 g/dL (ref 12.0–15.0)
Lymphocytes Relative: 22.7 % (ref 12.0–46.0)
Lymphs Abs: 2.4 10*3/uL (ref 0.7–4.0)
MCHC: 33.7 g/dL (ref 30.0–36.0)
MCV: 95.2 fl (ref 78.0–100.0)
Monocytes Absolute: 0.6 10*3/uL (ref 0.1–1.0)
Monocytes Relative: 6 % (ref 3.0–12.0)
Neutro Abs: 7.5 10*3/uL (ref 1.4–7.7)
Neutrophils Relative %: 70.4 % (ref 43.0–77.0)
Platelets: 393 10*3/uL (ref 150.0–400.0)
RBC: 4.55 Mil/uL (ref 3.87–5.11)
RDW: 12.4 % (ref 11.5–15.5)
WBC: 10.7 10*3/uL — ABNORMAL HIGH (ref 4.0–10.5)

## 2020-01-01 LAB — TSH: TSH: 0.72 u[IU]/mL (ref 0.35–4.50)

## 2020-01-01 LAB — D-DIMER, QUANTITATIVE: D-Dimer, Quant: <0.19 mcg/mL FEU (ref <0.50)

## 2020-01-01 MED ORDER — LEVALBUTEROL TARTRATE 45 MCG/ACT IN AERO: 1.0000 | INHALATION_SPRAY | Freq: Four times a day (QID) | RESPIRATORY_TRACT | 5 refills | Status: DC | PRN

## 2020-01-01 NOTE — Patient Instructions (Addendum)
Please stop by lab and x-ray before you go If you do not have mychart- we will call you about results within 5 business days of [place] receiving them.  If you have mychart- we will send your results within 3 business days of [place] receiving them.  If abnormal or we want to clarify a result, we will call or mychart you to make sure you receive the message.  If you have questions or concerns or don't hear within 5-7 days, please send [place] a message or call us.   Try xopenex to see if helps with shortness of breath- doesn't seem like asthma but worth a shot  May be worth getting allergist/asthma docs opinion as well with drainage and breathing issues  Get CXR- could have lingering irritation in lungs from prior illness you had or this could have been a bronchitis  Let me know if joint pains worsen or dont resolve within 30 minutes  CT of lungs to rule out blood clot if d dimer high   Recommended follow up: as needed for acute concerns or see [place] back if new or worsening symptoms

## 2020-01-01 NOTE — Progress Notes (Signed)
D-dimer is not elevated which rules out blood clot in the lungs as cause of shortness of breath

## 2020-01-01 NOTE — Progress Notes (Signed)
Phone [phone] In person visit   Subjective:   Debra Nelson is a 39 y.o. year old very pleasant female patient who presents for/with See problem oriented charting Chief Complaint  Patient presents with  . Shortness of Breath    neg covid   . Hip Pain    and shoulder pain     This visit occurred during the SARS-CoV-2 public health emergency.  Safety protocols were in place, including screening questions prior to the visit, additional usage of staff PPE, and extensive cleaning of exam room while observing appropriate contact time as indicated for disinfecting solutions.   Past Medical History-  Patient Active Problem List   Diagnosis Date Noted  . Chronic post-traumatic stress disorder (PTSD) 02/26/2017    Priority: High  . Narcolepsy cataplexy syndrome 10/19/2015    Priority: High  . Narcolepsy and cataplexy 03/06/2014    Priority: High  . Bipolar II disorder ([place]) 12/31/2013    Priority: High  . Asthma 10/27/2016    Priority: Medium  . GERD (gastroesophageal reflux disease) 10/27/2016    Priority: Medium  . Intractable migraine with aura with status migrainosus 10/19/2015    Priority: Medium  . Sensorineural hearing loss (SNHL) of both ears 12/12/2016    Priority: Low  . Seasonal allergic rhinitis 05/02/2016    Priority: Low  . Right ear pain 05/02/2016    Priority: Low  . Dysfunction of both eustachian tubes 05/02/2016    Priority: Low  . Chondromalacia of knee 03/07/2013    Priority: Low  . Atypical chest pain 10/06/2019  . Palpitations 08/27/2019  . Sinus tachycardia 08/27/2019  . Shortness of breath 08/27/2019  . Recurrent isolated sleep paralysis 07/02/2018  . Encounter for medication management 07/02/2018  . Menstrual migraine without status migrainosus, not intractable 01/02/2018  . Chronic thumb pain, bilateral 11/23/2017  . Elevated TSH 05/07/2017  . Trigger thumb, left thumb 03/15/2017    Medications- reviewed and updated Current Outpatient  Medications  Medication Sig Dispense Refill  . albuterol (2.5 MG/3ML) 0.083% NEBU 3 mL, albuterol (5 MG/ML) 0.5% NEBU 0.5 mL Inhale into the lungs daily as needed.    Marland Kitchen albuterol (PROAIR HFA) 108 (90 Base) MCG/ACT inhaler Inhale 2 puffs into the lungs every 6 (six) hours as needed for wheezing or shortness of breath.    . ALPRAZolam (XANAX) 0.5 MG tablet Take 0.5 mg by mouth as needed for anxiety.     . Armodafinil 150 MG tablet Take 1 tablet (150 mg total) by mouth daily. (Patient taking differently: Take 150 mg by mouth as needed. ) 30 tablet 4  . ARNUITY ELLIPTA 100 MCG/ACT AEPB     . busPIRone (BUSPAR) 30 MG tablet 30 mg 2 (two) times daily.    . diclofenac (VOLTAREN) 75 MG EC tablet Take 1 tablet (75 mg total) by mouth 2 (two) times daily. 50 tablet 2  . EPIPEN 2-PAK 0.3 MG/0.3ML SOAJ injection as needed.    . Fluticasone Furoate (ARNUITY ELLIPTA) 50 MCG/ACT AEPB Inhale 1 each into the lungs daily.    Marland Kitchen lamoTRIgine (LAMICTAL) 150 MG tablet Take 150 mg by mouth 2 (two) times daily.     Marland Kitchen levocetirizine (XYZAL) 5 MG tablet Take 5 mg by mouth every evening.    . montelukast (SINGULAIR) 10 MG tablet Take 10 mg by mouth at bedtime.    . Multiple Vitamins-Minerals (HAIR SKIN AND NAILS FORMULA PO) Take 1 tablet by mouth daily.     . Olopatadine HCl 0.6 %  SOLN Two sprays each nostril twice a day per allergist    . propranolol (INDERAL) 20 MG tablet TAKE ONE TABLET BY MOUTH TWICE A DAY 180 tablet 1  . pyridoxine (B-6) 100 MG tablet Take 100 mg by mouth daily.    . Sodium Oxybate 500 MG/ML SOLN Take 3.5gms at bedtime. Take 4.5gms 2 hours later. Total of 8gms per night. Dispense 359ml. 360 mL 5  . SUMAtriptan (IMITREX) 100 MG tablet TAKE 1/2 TO 1  TABLET BY MOUTH AT ONSET OF HEADACHE; MAY REPEAT ONE TABLET IN 2 HOURS IF NEEDED. 10 tablet 5  . Thiamine HCl (VITAMIN B-1) 250 MG tablet Take 250 mg by mouth daily.    . vitamin B-12 (CYANOCOBALAMIN) 250 MCG tablet Take 250 mcg by mouth daily.    Marland Kitchen  levalbuterol (XOPENEX HFA) 45 MCG/ACT inhaler Inhale 1-2 puffs into the lungs every 6 (six) hours as needed for wheezing (if having issues with high heart rate on albuterol). 1 Inhaler 5   No current facility-administered medications for this visit.     Objective:  BP 128/86   Pulse (!) 103   Temp 97.9 F (36.6 C) (Temporal)   Ht 5\' 2"  (1.575 m)   Wt 146 lb 9.6 oz (66.5 kg)   LMP 12/29/2019   SpO2 99%   BMI 26.81 kg/m  Gen: NAD, resting comfortably CV: Regular but tachycardic no murmurs rubs or gallops Lungs: CTAB no crackles, wheeze, rhonchi Abdomen: soft/nontender/nondistended/normal bowel sounds.  Ext: no edema or calf pain Skin: warm, dry    Assessment and Plan  # SOB  S:Patient has had increased shortness of breath with activity. Total duration about a month-she feels like it started around the time of her illness last month. No calf or leg swelling. Tachycardia issues since November- doubt PE as cause. No history dvt/pe personally or in family.  D-dimer not elevated last October but patient was not having shortness of breath at that time.  Most of the time her symptoms are with exertion.Will have symptoms sometimes when she tries to lie flat. Patient has history of asthma but has not used albuterol due to testing for tachycardia and concern this may worsen tachycardia. Patient is currently followed by Cardiology for that and currently has monitor.  She also had prior work-up for chest pain including echocardiogram and stress echocardiogram last year which were reassuring.  She is currently wearing a 30-day Holter monitor.  Sinus pressure improved from January 19th visit after augmentin. She states had been coughing during prior illness- has had bronchitis in the past. Not wheezing . No chest pain with this other than with really deep breaths occasional. Right side ear pressure still but more mild than before, mild throat irritation. Sometimes feels like has trouble swallowing. No  trouble with liquids- saliva may be hard to swallow but not if has water. Nothing feels like getting stucked. Off of prilosec- had seen Dr. Inda Merlin of [facility] in past.  A/P: Shortness of breath of unclear etiology-has had pretty extensive prior cardiac work-up and I doubt cardiac cause of shortness of breath for chest pain including echocardiogram and stress test which were reassuring.  She is wearing a Holter monitor for 30 days-could possibly be related to rhythm issues but we will await final report from cardiology  Patient also had sinus infection last month but has had lingering shortness of breath since that time-may have had a mild bronchitis.  Will update chest x-ray.  I did send in [place] for  her to try as that likely would not increase heart rate is much and I would like to see if this could be related to asthma.  She would have some peace of mind with [place] doing another D-dimer to rule out blood clot-we discussed if this was elevated would need to get CT angiogram-given symptoms have been persistent for a month I would not send her to the emergency room for this unless she had worsening symptoms.  Patient has had some irritation with swallowing and has been off Prilosec for some time-recommended 1 month retrial.  She also seems to have some ear irritation and postnasal drip despite treatment from allergist-encouraged follow-up with allergist-already on Singulair and Xyzal.  They could also give [place] their opinion on shortness of breath issues  # Joint pain  #Heat intolerance/pinprick sensation S:Patient has had increased hip and shoulder pain when getting up in the mornings. Lasts 15-30 minutes before going away. Has brand new mattress- started a long time after new mattress- going on for about a month. Only has after she has been asleep for few hours. Does not have every day. When she does it improves after moving around a little bit. Does not impact daily activities.   Occasionally has periods  of heat intolerance but feels cold to touch. Also getting pinprick sensation all over still but it has improved (lfts have been ok) A/P: Joint pain does not appear to be rheumatoid arthritis related-I wonder if it could be related to her new mattress but she feels like it started sometime after she got the new mattress.  Regardless it is rather short-lived after 15 to 30 minutes resolves and we opted to monitor only for now-could refer to sports medicine if needed.  I doubt rheumatoid arthritis  For her heat intolerance-we will get TSH.  Also update CBC and CMP as she would like updated blood work in general  Recommended follow up: As needed for acute concerns if worsens or fails to improve Future Appointments  Date Time Provider [place]  01/12/2020  9:30 AM Ward Givens, NP [facility] None  02/09/2020 10:45 AM Regal, Tamala Fothergill, DPM [facility]    Lab/Order associations:   ICD-10-CM   1. SOB (shortness of breath)  R06.02 DG Chest 2 View    D-Dimer, Quantitative    CBC with Differential/Platelet    Comprehensive metabolic panel  2. Heat intolerance  R68.89 TSH    Meds ordered this encounter  Medications  . levalbuterol (XOPENEX HFA) 45 MCG/ACT inhaler    Sig: Inhale 1-2 puffs into the lungs every 6 (six) hours as needed for wheezing (if having issues with high heart rate on albuterol).    Dispense:  1 Inhaler    Refill:  5    Time Spent: 32 minutes of total time (10:31 AM- 11:03 AM) was spent on the date of the encounter performing the following actions: chart review prior to seeing the patient, obtaining history, performing a medically necessary exam, counseling on the treatment plan, placing orders, and documenting in our EHR.   Return precautions advised.  Garret Reddish, MD

## 2020-01-02 ENCOUNTER — Encounter: Payer: Self-pay | Admitting: Neurology

## 2020-01-05 ENCOUNTER — Telehealth: Payer: Self-pay | Admitting: Neurology

## 2020-01-05 NOTE — Telephone Encounter (Signed)
Debra Nelson from [facility] called stating that they are needing an approval from the provider due to the pt starting to take Gabapentin. Please call [phone] option 3 then option 4

## 2020-01-05 NOTE — Telephone Encounter (Signed)
Called the pharmacy to give the verbal order. Dr Brett Fairy is ok with patient continuing Xyrem even though gabapentin is on board.

## 2020-01-06 NOTE — Telephone Encounter (Signed)
Patient will be here tomorrow to pick them up. They will be up front for her.

## 2020-01-12 ENCOUNTER — Other Ambulatory Visit: Payer: Self-pay

## 2020-01-12 ENCOUNTER — Encounter: Payer: Self-pay | Admitting: Adult Health

## 2020-01-12 ENCOUNTER — Ambulatory Visit: Payer: BC Managed Care – PPO | Admitting: Adult Health

## 2020-01-12 VITALS — BP 100/72 | HR 76 | Temp 97.5°F | Ht 62.0 in | Wt 152.0 lb

## 2020-01-12 DIAGNOSIS — G4489 Other headache syndrome: Secondary | ICD-10-CM

## 2020-01-12 DIAGNOSIS — G47411 Narcolepsy with cataplexy: Secondary | ICD-10-CM

## 2020-01-12 NOTE — Patient Instructions (Signed)
Your Plan:  Continue Xyrem and Nuvigil  Keep headache journal Consider adding Zonegran if headaches continue If your symptoms worsen or you develop new symptoms please let us know.   Thank you for coming to see [place] at [facility]. I hope we have been able to provide you high quality care today.  You may receive a patient satisfaction survey over the next few weeks. We would appreciate your feedback and comments so that we may continue to improve ourselves and the health of our patients.

## 2020-01-12 NOTE — Progress Notes (Signed)
PATIENT: Debra Nelson DOB: 1981/09/16  REASON FOR VISIT: follow up HISTORY FROM: patient  HISTORY OF PRESENT ILLNESS: Today 01/12/20:  Debra Nelson is a 39 year old female with a history of narcolepsy with cataplexy.  She returns today for follow-up.  She continues on Xyrem and a new vigil.  She states that her depression has been worse with Covid but denies any suicidal thoughts.  She does see a psychiatrist.  She is currently not working.  Denies any trouble driving.  Denies any cataplectic events.  Reports that she has had an increase in headaches in the last month.  The location varies.  She states that it can sometimes be sharp or dull.  She does have photophobia and phonophobia on occasion.  Denies nausea and vomiting.  She does take Tylenol and reports that it works intermittently.  She also has sumatriptan.  In the past she has been on Zonegran.  HISTORY 07-10-2019 (Copied from Dr.Dohmeier's note) ,I have the pleasure of seeing Debra Nelson in the presence of her husband here today , she is usually getting a good nights rest. Epworth is 8/ 24 and FSS endorsed at 34/63 point.  Migraine have been infrequent.  Blood work and refills today.  Depression : symptoms denied.   REVIEW OF SYSTEMS: Out of a complete 14 system review of symptoms, the patient complains only of the following symptoms, and all other reviewed systems are negative.  See HPI  ALLERGIES: Allergies  Allergen Reactions  . Other Other (See Comments)  . Phenergan [Promethazine Hcl]     IV causes muscle tics, may take oral, or give benadryl prior to IV  . Promethazine Other (See Comments)    She cannot take IV phenergan unless this it is given with Benadryl.  . Promethazine Hcl Other (See Comments)    IV causes muscle tics, may take oral, or give benadryl prior to IV  . Topamax [Topiramate] Nausea And Vomiting    confusion  . Klonopin [Clonazepam] Nausea Only    HOME MEDICATIONS: Outpatient Medications  Prior to Visit  Medication Sig Dispense Refill  . albuterol (2.5 MG/3ML) 0.083% NEBU 3 mL, albuterol (5 MG/ML) 0.5% NEBU 0.5 mL Inhale into the lungs daily as needed.    Marland Kitchen albuterol (PROAIR HFA) 108 (90 Base) MCG/ACT inhaler Inhale 2 puffs into the lungs every 6 (six) hours as needed for wheezing or shortness of breath.    . ALPRAZolam (XANAX) 0.5 MG tablet Take 0.5 mg by mouth as needed for anxiety.     . Armodafinil 150 MG tablet Take 1 tablet (150 mg total) by mouth daily. (Patient taking differently: Take 150 mg by mouth as needed. ) 30 tablet 4  . busPIRone (BUSPAR) 30 MG tablet 30 mg 2 (two) times daily.    Marland Kitchen EPIPEN 2-PAK 0.3 MG/0.3ML SOAJ injection as needed.    . Fluticasone Furoate (ARNUITY ELLIPTA) 50 MCG/ACT AEPB Inhale 1 each into the lungs daily.    Marland Kitchen lamoTRIgine (LAMICTAL) 150 MG tablet Take 150 mg by mouth 2 (two) times daily.     Marland Kitchen levalbuterol (XOPENEX HFA) 45 MCG/ACT inhaler Inhale 1-2 puffs into the lungs every 6 (six) hours as needed for wheezing (if having issues with high heart rate on albuterol). 1 Inhaler 5  . levocetirizine (XYZAL) 5 MG tablet Take 5 mg by mouth every evening.    . montelukast (SINGULAIR) 10 MG tablet Take 10 mg by mouth at bedtime.    . Multiple Vitamins-Minerals (HAIR SKIN AND  NAILS FORMULA PO) Take 1 tablet by mouth daily.     . Olopatadine HCl 0.6 % SOLN Two sprays each nostril twice a day per allergist    . propranolol (INDERAL) 20 MG tablet TAKE ONE TABLET BY MOUTH TWICE A DAY 180 tablet 1  . pyridoxine (B-6) 100 MG tablet Take 100 mg by mouth daily.    . Sodium Oxybate 500 MG/ML SOLN Take 3.5gms at bedtime. Take 4.5gms 2 hours later. Total of 8gms per night. Dispense 329ml. 360 mL 5  . SUMAtriptan (IMITREX) 100 MG tablet TAKE 1/2 TO 1  TABLET BY MOUTH AT ONSET OF HEADACHE; MAY REPEAT ONE TABLET IN 2 HOURS IF NEEDED. 10 tablet 5  . Thiamine HCl (VITAMIN B-1) 250 MG tablet Take 250 mg by mouth daily.    . vitamin B-12 (CYANOCOBALAMIN) 250 MCG tablet  Take 250 mcg by mouth daily.    . ARNUITY ELLIPTA 100 MCG/ACT AEPB     . diclofenac (VOLTAREN) 75 MG EC tablet Take 1 tablet (75 mg total) by mouth 2 (two) times daily. 50 tablet 2   No facility-administered medications prior to visit.    PAST MEDICAL HISTORY: Past Medical History:  Diagnosis Date  . Asthma   . Bipolar disorder with depression ([place])    and PTSD  . Chicken pox   . Chronic migraine w/o aura, not intractable, w stat migr   . Deviated septum   . DJD (degenerative joint disease)    Severe of the right knee-Dr Creighton-ortho and Dr Gean Birchwood, PA-preferred pain managment  . GERD (gastroesophageal reflux disease)   . Narcolepsy   . PTSD (post-traumatic stress disorder)   . Seasonal allergic rhinitis     PAST SURGICAL HISTORY: Past Surgical History:  Procedure Laterality Date  . knee surgery x5     08,11, 12, 13 x2- R knee  . right foot surgery     arthritis/bone spurus. plate placed.   . WISDOM TOOTH EXTRACTION      FAMILY HISTORY: Family History  Problem Relation Age of Onset  . Mental illness Mother        does not speak to regularly so does not know full history  . Other Father        medical issues, divorce related  . Other Brother        ptsd- fully discharged medically disabled before age 81  . Heart murmur Brother   . Cancer Paternal Grandfather     SOCIAL HISTORY: Social History   Socioeconomic History  . Marital status: Married    Spouse name: [name]  . Number of children: 3  . Years of education: College  . Highest education level: Not on file  Occupational History    Employer: OTHER  Tobacco Use  . Smoking status: Never Smoker  . Smokeless tobacco: Never Used  Substance and Sexual Activity  . Alcohol use: No  . Drug use: No  . Sexual activity: Yes    Partners: Male    Birth control/protection: Pill  Other Topics Concern  . Not on file  Social History Narrative   Patient is married ([name]) and lives at home with her family    4 adults - 2 kids 50% of the time (1 son, 1 daughter). 2 older children (one age 54 from rape, then 48 year old- does not get to see- related to bipolar) Moved in with in laws to help them      Patient has 4 children (3 of her own  and 1 step son).    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   Patient is right-handed.   Social Determinants of Health   Financial Resource Strain:   . Difficulty of Paying Living Expenses: Not on file  Food Insecurity:   . Worried About Charity fundraiser in the Last Year: Not on file  . Ran Out of Food in the Last Year: Not on file  Transportation Needs:   . Lack of Transportation (Medical): Not on file  . Lack of Transportation (Non-Medical): Not on file  Physical Activity:   . Days of Exercise per Week: Not on file  . Minutes of Exercise per Session: Not on file  Stress:   . Feeling of Stress : Not on file  Social Connections:   . Frequency of Communication with Friends and Family: Not on file  . Frequency of Social Gatherings with Friends and Family: Not on file  . Attends Religious Services: Not on file  . Active Member of Clubs or Organizations: Not on file  . Attends Archivist Meetings: Not on file  . Marital Status: Not on file  Intimate Partner Violence:   . Fear of Current or Ex-Partner: Not on file  . Emotionally Abused: Not on file  . Physically Abused: Not on file  . Sexually Abused: Not on file      PHYSICAL EXAM  Vitals:   01/12/20 0939  BP: 100/72  Pulse: 76  Temp: (!) 97.5 F (36.4 C)  Weight: 152 lb (68.9 kg)  Height: 5\' 2"  (1.575 m)   Body mass index is 27.8 kg/m.  Generalized: Well developed, in no acute distress   Neurological examination  Mentation: Alert oriented to time, place, history taking. Follows all commands speech and language fluent Cranial nerve II-XII: Pupils were equal round  reactive to light. Extraocular movements were full, visual field were full on confrontational test. e. Head turning and shoulder shrug  were normal and symmetric. Motor: The motor testing reveals 5 over 5 strength of all 4 extremities. Good symmetric motor tone is noted throughout.  Sensory: Sensory testing is intact to soft touch on all 4 extremities. No evidence of extinction is noted.  Coordination: Cerebellar testing reveals good finger-nose-finger and heel-to-shin bilaterally.  Gait and station: Gait is normal. Tandem gait is normal. Romberg is negative. No drift is seen.  Reflexes: Deep tendon reflexes are symmetric and normal bilaterally.   DIAGNOSTIC DATA (LABS, IMAGING, TESTING) - I reviewed patient records, labs, notes, testing and imaging myself where available.  Lab Results  Component Value Date   WBC 10.7 (H) 01/01/2020   HGB 14.6 01/01/2020   HCT 43.3 01/01/2020   MCV 95.2 01/01/2020   PLT 393.0 01/01/2020      Component Value Date/Time   NA 139 01/01/2020 1053   NA 142 08/23/2019 0000   K 4.3 01/01/2020 1053   CL 102 01/01/2020 1053   CO2 30 01/01/2020 1053   GLUCOSE 104 (H) 01/01/2020 1053   BUN 11 01/01/2020 1053   BUN 10 08/23/2019 0000   CREATININE 0.78 01/01/2020 1053   CALCIUM 10.2 01/01/2020 1053   PROT 6.9 01/01/2020 1053   PROT 7.2 07/10/2019 1008   ALBUMIN 4.7 01/01/2020 1053   ALBUMIN 5.2 (H) 07/10/2019 1008   AST 13 01/01/2020 1053   ALT 12 01/01/2020 1053  ALKPHOS 78 01/01/2020 1053   BILITOT 0.9 01/01/2020 1053   BILITOT 0.7 07/10/2019 1008   GFRNONAA 74 07/10/2019 1008   GFRAA 86 07/10/2019 1008   Lab Results  Component Value Date   CHOL 170 01/30/2018   HDL 57 01/30/2018   LDLCALC 95 01/30/2018   TRIG 91 01/30/2018   Lab Results  Component Value Date   HGBA1C 4.9 01/30/2018   Lab Results  Component Value Date   VITAMINB12 >2000 (H) 01/30/2018   Lab Results  Component Value Date   TSH 0.72 01/01/2020      ASSESSMENT AND  PLAN 39 y.o. year old female  has a past medical history of Asthma, Bipolar disorder with depression ([place]), Chicken pox, Chronic migraine w/o aura, not intractable, w stat migr, Deviated septum, DJD (degenerative joint disease), GERD (gastroesophageal reflux disease), Narcolepsy, PTSD (post-traumatic stress disorder), and Seasonal allergic rhinitis. here with:  1.  Narcolepsy  -Continue Xyrem individual -Depression managed through psychiatry -Denies suicidal thoughts  2.  Headaches  -Keep a headache journal -If headache frequency continues to increase we will consider restarting Zonegran  Advised if symptoms worsen or she develops new symptoms she should let us know.  Follow-up in 6 months or sooner if needed.  I spent 25 minutes with the patient this time was spent reviewing the chart and discussing plan of care   Ward Givens, MSN, NP-C 01/12/2020, 10:04 AM [facility] [address] [place], [place] [address] [phone]

## 2020-01-16 NOTE — Telephone Encounter (Signed)
When did she last use the monitor? If she is currently using it, ask her to stop wearing it. Use Aloe vera or any other moisturizing cream. This should get better in next few days.  Thanks MJP

## 2020-01-16 NOTE — Telephone Encounter (Signed)
I don't expect monitor from 3 weeks ago to cause symptoms now. Nonetheless, still recommend above.

## 2020-01-19 NOTE — Telephone Encounter (Signed)
When did she last wear it?

## 2020-01-20 NOTE — Telephone Encounter (Signed)
Great.  Thanks

## 2020-01-26 ENCOUNTER — Encounter: Payer: Self-pay | Admitting: Family Medicine

## 2020-01-26 DIAGNOSIS — K219 Gastro-esophageal reflux disease without esophagitis: Secondary | ICD-10-CM

## 2020-01-27 ENCOUNTER — Encounter: Payer: Self-pay | Admitting: Neurology

## 2020-01-27 MED ORDER — DEXILANT 30 MG PO CPDR: 30.0000 mg | DELAYED_RELEASE_CAPSULE | Freq: Every day | ORAL | 1 refills | Status: DC

## 2020-01-27 NOTE — Assessment & Plan Note (Signed)
MyChart message "I sent in one of the strongest reflux medicines dexilant- If you do not improve significantly on this- I would like for you to return to see GI- you may also need H. PYlori testing (if bacteria that can make reflux tough to treat).  I sent in 2 months worth but if you do really well through 1 month you can try to go down to Prilosec 40 mg for 1 month and then Prilosec 20 mg for another month-let me know if you are not able to progress on this regimen.  Thanks, Garret Reddish"  Patient reported trying Prilosec 40 mg daily for a month without significant relief.  In the past had been on 40 mg twice a day with Dr. Lucianne Lei have to get her back in with him

## 2020-01-29 NOTE — Telephone Encounter (Signed)
I am fine with your psychiatrist's recommendations.  I wish you best of  success in gaining control of the anxiety.  Larey Seat, MD

## 2020-01-30 MED ORDER — PANTOPRAZOLE SODIUM 40 MG PO TBEC: 40.0000 mg | DELAYED_RELEASE_TABLET | Freq: Every day | ORAL | 1 refills | Status: DC

## 2020-01-30 NOTE — Addendum Note (Signed)
Addended by: Marin Olp on: 01/30/2020 11:19 AM   Modules accepted: Orders

## 2020-01-31 ENCOUNTER — Emergency Department (HOSPITAL_COMMUNITY)
Admission: EM | Admit: 2020-01-31 | Discharge: 2020-02-01 | Disposition: A | Discharge status: Home / Self Care | Payer: BC Managed Care – PPO | Attending: Emergency Medicine | Admitting: Emergency Medicine

## 2020-01-31 ENCOUNTER — Emergency Department (HOSPITAL_COMMUNITY): Payer: BC Managed Care – PPO

## 2020-01-31 ENCOUNTER — Other Ambulatory Visit: Payer: Self-pay

## 2020-01-31 ENCOUNTER — Encounter (HOSPITAL_COMMUNITY): Payer: Self-pay | Admitting: Emergency Medicine

## 2020-01-31 ENCOUNTER — Encounter: Payer: Self-pay | Admitting: Neurology

## 2020-01-31 DIAGNOSIS — H9201 Otalgia, right ear: Secondary | ICD-10-CM | POA: Diagnosis not present

## 2020-01-31 DIAGNOSIS — H538 Other visual disturbances: Secondary | ICD-10-CM | POA: Diagnosis not present

## 2020-01-31 DIAGNOSIS — R0602 Shortness of breath: Secondary | ICD-10-CM | POA: Insufficient documentation

## 2020-01-31 DIAGNOSIS — Z79899 Other long term (current) drug therapy: Secondary | ICD-10-CM | POA: Insufficient documentation

## 2020-01-31 DIAGNOSIS — H539 Unspecified visual disturbance: Secondary | ICD-10-CM

## 2020-01-31 DIAGNOSIS — R55 Syncope and collapse: Secondary | ICD-10-CM | POA: Diagnosis present

## 2020-01-31 DIAGNOSIS — F319 Bipolar disorder, unspecified: Secondary | ICD-10-CM | POA: Diagnosis not present

## 2020-01-31 DIAGNOSIS — R0789 Other chest pain: Secondary | ICD-10-CM | POA: Diagnosis not present

## 2020-01-31 DIAGNOSIS — G43709 Chronic migraine without aura, not intractable, without status migrainosus: Secondary | ICD-10-CM | POA: Diagnosis not present

## 2020-01-31 LAB — BASIC METABOLIC PANEL
Anion gap: 11 (ref 5–15)
BUN: 6 mg/dL (ref 6–20)
CO2: 23 mmol/L (ref 22–32)
Calcium: 9.5 mg/dL (ref 8.9–10.3)
Chloride: 104 mmol/L (ref 98–111)
Creatinine, Ser: 0.9 mg/dL (ref 0.44–1.00)
GFR calc Af Amer: >60 mL/min (ref >60)
GFR calc non Af Amer: >60 mL/min (ref >60)
Glucose, Bld: 125 mg/dL — ABNORMAL HIGH (ref 70–99)
Potassium: 4.6 mmol/L (ref 3.5–5.1)
Sodium: 138 mmol/L (ref 135–145)

## 2020-01-31 LAB — CBC
HCT: 45.9 % (ref 36.0–46.0)
Hemoglobin: 15.3 g/dL — ABNORMAL HIGH (ref 12.0–15.0)
MCH: 31.8 pg (ref 26.0–34.0)
MCHC: 33.3 g/dL (ref 30.0–36.0)
MCV: 95.4 fL (ref 80.0–100.0)
Platelets: 388 10*3/uL (ref 150–400)
RBC: 4.81 MIL/uL (ref 3.87–5.11)
RDW: 12 % (ref 11.5–15.5)
WBC: 7.5 10*3/uL (ref 4.0–10.5)
nRBC: 0 % (ref 0.0–0.2)

## 2020-01-31 LAB — I-STAT BETA HCG BLOOD, ED (MC, WL, AP ONLY): I-stat hCG, quantitative: <5 m[IU]/mL (ref <5)

## 2020-01-31 LAB — TROPONIN I (HIGH SENSITIVITY)
Troponin I (High Sensitivity): <2 ng/L (ref <18)
Troponin I (High Sensitivity): 2 ng/L (ref <18)

## 2020-01-31 MED ORDER — LORAZEPAM 2 MG/ML IJ SOLN
1.0000 mg | Freq: Once | INTRAMUSCULAR | Status: AC
  Administered 2020-01-31: 1 mg via INTRAVENOUS
  Filled 2020-01-31: qty 1

## 2020-01-31 MED ORDER — LORAZEPAM 2 MG/ML IJ SOLN
1.0000 mg | INTRAMUSCULAR | Status: AC
  Administered 2020-01-31: 1 mg via INTRAVENOUS
  Filled 2020-01-31: qty 1

## 2020-01-31 MED ORDER — SODIUM CHLORIDE 0.9 % IV BOLUS
1000.0000 mL | Freq: Once | INTRAVENOUS | Status: AC
  Administered 2020-01-31: 1000 mL via INTRAVENOUS

## 2020-01-31 MED ORDER — SODIUM CHLORIDE 0.9% FLUSH
3.0000 mL | Freq: Once | INTRAVENOUS | Status: AC
  Administered 2020-01-31: 3 mL via INTRAVENOUS

## 2020-01-31 MED ORDER — PROMETHAZINE HCL 25 MG PO TABS
25.0000 mg | ORAL_TABLET | Freq: Once | ORAL | Status: AC
  Administered 2020-01-31: 25 mg via ORAL
  Filled 2020-01-31: qty 1

## 2020-01-31 NOTE — ED Notes (Addendum)
Pt states that her right eye has started "bothering" her, states that it feels like "it's twitching or something. It feels numb." No nystagmus noted, denies pain, denies change in vision. H/o 1 psuedoseizure during pregnancy but otherwise no hx. Takes Neurontin and xanax at home.

## 2020-01-31 NOTE — ED Triage Notes (Signed)
Pt had near syncopal episode with blurry peripheral vision and dizziness while visiting her mom upstairs.  Reports intermittent pain across upper chest and upper abd.  Headache earlier that resolved with Excedrin.  Denies nausea and vomiting.  Reports SOB.  Wore cardiac monitor for 25 days due to palpitations but hasn't received results.

## 2020-01-31 NOTE — ED Provider Notes (Signed)
[facility] EMERGENCY DEPARTMENT Provider Note   CSN: [id number] Arrival date & time: 01/31/20  1419     History Chief Complaint  Patient presents with  . Near Syncope    Debra Nelson is a 39 y.o. female with a past medical history of GERD, bipolar disorder, PTSD, asthma, chronic migraines, narcolepsy cataplexy syndrome, who presents today for evaluation after a near syncopal event. She reports that she was visiting her mother in law upstairs when she felt like she was going to pass out.  While she was walking she started having tunnel vision bilaterally.  After this her chest felt tight and she felt anxious and short of breath.  This resolved, however her right eye reportedly feels cold with blurry vision on the lateral aspect of the right eye.  She reports that her right eye feels cold.  She is unable to fully describe it.  She reports that she has never had the aura before a migraine before.  She denies headache at this time.  She did have a headache earlier today, however it resolved after Excedrin.  She does not have a headache when this episode occurred earlier.  She denies any possible causative event.  She reports that she feels slightly short of breath however this is consistent with her baseline and not changed or different.  She denies any numbness on the face or weakness in arms or legs.  She did not have any difficulty speaking during this.  She has also had multiple medication changes recently according to chart review.  She denies any history of prior.  She has not found anything that worsens or alleviates her peripheral blurry vision.  She denies any leg swelling.  No recent surgeries or immobilizations.  She does not take any hormones.  She denies personal history of PE/DVT.  No hemoptysis.  HPI     Past Medical History:  Diagnosis Date  . Asthma   . Bipolar disorder with depression ([place])    and PTSD  . Chicken pox   . Chronic migraine w/o  aura, not intractable, w stat migr   . Deviated septum   . DJD (degenerative joint disease)    Severe of the right knee-Dr Creighton-ortho and Dr Gean Birchwood, PA-preferred pain managment  . GERD (gastroesophageal reflux disease)   . Narcolepsy   . PTSD (post-traumatic stress disorder)   . Seasonal allergic rhinitis     Patient Active Problem List   Diagnosis Date Noted  . Atypical chest pain 10/06/2019  . Palpitations 08/27/2019  . Sinus tachycardia 08/27/2019  . Shortness of breath 08/27/2019  . Recurrent isolated sleep paralysis 07/02/2018  . Encounter for medication management 07/02/2018  . Menstrual migraine without status migrainosus, not intractable 01/02/2018  . Chronic thumb pain, bilateral 11/23/2017  . Elevated TSH 05/07/2017  . Trigger thumb, left thumb 03/15/2017  . Chronic post-traumatic stress disorder (PTSD) 02/26/2017  . Sensorineural hearing loss (SNHL) of both ears 12/12/2016  . Asthma 10/27/2016  . GERD (gastroesophageal reflux disease) 10/27/2016  . Seasonal allergic rhinitis 05/02/2016  . Right ear pain 05/02/2016  . Dysfunction of both eustachian tubes 05/02/2016  . Narcolepsy cataplexy syndrome 10/19/2015  . Intractable migraine with aura with status migrainosus 10/19/2015  . Narcolepsy and cataplexy 03/06/2014  . Bipolar II disorder ([place]) 12/31/2013  . Chondromalacia of knee 03/07/2013    Past Surgical History:  Procedure Laterality Date  . knee surgery x5     08,11, 12, 13 x2- R knee  .  right foot surgery     arthritis/bone spurus. plate placed.   . WISDOM TOOTH EXTRACTION       OB History   No obstetric history on file.     Family History  Problem Relation Age of Onset  . Mental illness Mother        does not speak to regularly so does not know full history  . Other Father        medical issues, divorce related  . Other Brother        ptsd- fully discharged medically disabled before age 12  . Heart murmur Brother   . Cancer  Paternal Grandfather     Social History   Tobacco Use  . Smoking status: Never Smoker  . Smokeless tobacco: Never Used  Substance Use Topics  . Alcohol use: No  . Drug use: No    Home Medications Prior to Admission medications   Medication Sig Start Date End Date Taking? Authorizing Provider  albuterol (2.5 MG/3ML) 0.083% NEBU 3 mL, albuterol (5 MG/ML) 0.5% NEBU 0.5 mL Inhale into the lungs daily as needed.   Yes [provider]  albuterol (PROAIR HFA) 108 (90 Base) MCG/ACT inhaler Inhale 2 puffs into the lungs every 6 (six) hours as needed for wheezing or shortness of breath.   Yes [provider]  ALPRAZolam Duanne Moron) 0.5 MG tablet Take 0.5 mg by mouth as needed for anxiety.    Yes [provider]  busPIRone (BUSPAR) 30 MG tablet 30 mg 2 (two) times daily. 12/31/18  Yes [provider]  EPIPEN 2-PAK 0.3 MG/0.3ML SOAJ injection as needed. 02/03/14  Yes [provider]  gabapentin (NEURONTIN) 300 MG capsule Take 300 mg by mouth 3 (three) times daily.   Yes [provider]  lamoTRIgine (LAMICTAL) 150 MG tablet Take 150 mg by mouth 2 (two) times daily.    Yes [provider]  levalbuterol (XOPENEX HFA) 45 MCG/ACT inhaler Inhale 1-2 puffs into the lungs every 6 (six) hours as needed for wheezing (if having issues with high heart rate on albuterol). 01/01/20  Yes Marin Olp, MD  levocetirizine (XYZAL) 5 MG tablet Take 5 mg by mouth every evening.   Yes [provider]  magnesium oxide (MAG-OX) 400 MG tablet Take 400 mg by mouth every evening.   Yes [provider]  Multiple Vitamins-Minerals (HAIR SKIN AND NAILS FORMULA PO) Take 1 tablet by mouth daily.    Yes [provider]  Olopatadine HCl 0.6 % SOLN Place 1 spray into both nostrils in the morning and at bedtime.  11/20/19  Yes [provider]  pantoprazole (PROTONIX) 40 MG tablet Take 1 tablet (40 mg total) by mouth daily. 01/30/20  Yes  Marin Olp, MD  promethazine (PHENERGAN) 25 MG tablet Take 25 mg by mouth every 6 (six) hours as needed for nausea or vomiting.   Yes [provider]  propranolol (INDERAL) 20 MG tablet TAKE ONE TABLET BY MOUTH TWICE A DAY Patient taking differently: Take 20 mg by mouth 2 (two) times daily.  11/24/19  Yes Patwardhan, Manish J, MD  pyridoxine (B-6) 100 MG tablet Take 100 mg by mouth in the morning and at bedtime.    Yes [provider]  Sodium Oxybate 500 MG/ML SOLN Take 3.5gms at bedtime. Take 4.5gms 2 hours later. Total of 8gms per night. Dispense 364ml. 12/02/19  Yes Dohmeier, Asencion Partridge, MD  SUMAtriptan (IMITREX) 100 MG tablet TAKE 1/2 TO 1  TABLET BY  MOUTH AT ONSET OF HEADACHE; MAY REPEAT ONE TABLET IN 2 HOURS IF NEEDED. Patient taking differently: Take 50-100 mg by mouth every 2 (two) hours as needed for headache.  09/22/19  Yes Dohmeier, Asencion Partridge, MD  Thiamine HCl (VITAMIN B-1) 250 MG tablet Take 250 mg by mouth daily.   Yes [provider]  vitamin B-12 (CYANOCOBALAMIN) 250 MCG tablet Take 250 mcg by mouth in the morning and at bedtime.    Yes [provider]  Armodafinil 150 MG tablet Take 1 tablet (150 mg total) by mouth daily. Patient not taking: Reported on 01/31/2020 07/10/19   Dohmeier, Asencion Partridge, MD  Dexlansoprazole (DEXILANT) 30 MG capsule Take 1 capsule (30 mg total) by mouth daily. Patient not taking: Reported on 01/31/2020 01/27/20   Marin Olp, MD    Allergies    Other, Phenergan [promethazine hcl], Promethazine, Promethazine hcl, Topamax [topiramate], and Klonopin [clonazepam]  Review of Systems   Review of Systems  Constitutional: Negative for chills and fever.  HENT: Positive for ear pain (Right sided, chronic). Negative for congestion.   Respiratory: Positive for chest tightness and shortness of breath (Chronic, unchanged). Negative for cough.   Cardiovascular: Positive for chest pain. Negative for palpitations and leg swelling.    Gastrointestinal: Negative for abdominal pain, diarrhea, nausea and vomiting.  Genitourinary: Negative for dysuria.  Musculoskeletal: Negative for back pain and neck pain.  Skin: Negative for color change, rash and wound.  Neurological: Positive for headaches (Not currently).  Psychiatric/Behavioral: Negative for confusion. The patient is nervous/anxious (When had tunnel vision earlier).   All other systems reviewed and are negative.   Physical Exam Updated Vital Signs BP 106/88   Pulse 96   Temp 98.3 F (36.8 C) (Oral)   Resp (!) 22   Ht 5\' 2"  (1.575 m)   Wt 65.8 kg   SpO2 98%   BMI 26.52 kg/m   Physical Exam Vitals and nursing note reviewed.  Constitutional:      General: She is not in acute distress.    Appearance: She is well-developed.  HENT:     Head: Normocephalic and atraumatic.     Ears:     Comments: Right-sided minimal serous effusion behind the TM without evidence of abnormal erythema or bulging. Eyes:     Conjunctiva/sclera: Conjunctivae normal.  Cardiovascular:     Rate and Rhythm: Normal rate and regular rhythm.     Heart sounds: No murmur.  Pulmonary:     Effort: Pulmonary effort is normal. No respiratory distress.     Breath sounds: Normal breath sounds.  Abdominal:     Palpations: Abdomen is soft.     Tenderness: There is no abdominal tenderness.  Musculoskeletal:     Cervical back: Neck supple.  Skin:    General: Skin is warm and dry.  Neurological:     Mental Status: She is alert.     Comments: Mental Status:  Alert, oriented, thought content appropriate, able to give a coherent history. Speech fluent without evidence of aphasia. Able to follow 2 step commands without difficulty.  Cranial Nerves:  II:  Peripheral visual fields grossly normal to counting fingers, pupils equal, round, reactive to light III,IV, VI: ptosis not present, extra-ocular motions intact bilaterally  V,VII: smile symmetric, facial light touch sensation equal VIII:  hearing grossly normal to voice  X: uvula elevates symmetrically  XI: bilateral shoulder shrug symmetric and strong XII: midline tongue extension without fassiculations Motor:  Normal tone. 5/5 in upper and lower extremities  bilaterally including strong and equal grip strength and dorsiflexion/plantar flexion Cerebellar: normal finger-to-nose with bilateral upper extremities Gait: normal gait and balance CV: distal pulses palpable throughout    Psychiatric:     Comments: Patient states she overall feels anxious, that she is texting her friend to try to "not freak out."     ED Results / Procedures / Treatments   Labs (all labs ordered are listed, but only abnormal results are displayed) Labs Reviewed  BASIC METABOLIC PANEL - Abnormal; Notable for the following components:      Result Value   Glucose, Bld 125 (*)    All other components within normal limits  CBC - Abnormal; Notable for the following components:   Hemoglobin 15.3 (*)    All other components within normal limits  I-STAT BETA HCG BLOOD, ED ([facility], [facility], [facility] ONLY)  TROPONIN I (HIGH SENSITIVITY)  TROPONIN I (HIGH SENSITIVITY)    EKG EKG Interpretation  Date/Time:  Saturday January 31 2020 14:26:39 EST Ventricular Rate:  87 PR Interval:  114 QRS Duration: 70 QT Interval:  336 QTC Calculation: 404 R Axis:   67 Text Interpretation: Normal sinus rhythm Low voltage QRS No significant change since last tracing Confirmed by Blanchie Dessert [phone]) on 01/31/2020 5:10:24 PM   Radiology DG Chest 2 View  Result Date: 01/31/2020 CLINICAL DATA:  Chest pain. Near syncope. Dizziness. EXAM: CHEST - 2 VIEW COMPARISON:  Do 1121 FINDINGS: The cardiomediastinal contours are normal. The lungs are clear. Pulmonary vasculature is normal. No consolidation, pleural effusion, or pneumothorax. No acute osseous abnormalities are seen. IMPRESSION: Negative radiographs of the chest. Electronically Signed   By: Keith Rake M.D.   On:  01/31/2020 15:05    Procedures Procedures (including critical care time)  Medications Ordered in ED Medications  sodium chloride flush (NS) 0.9 % injection 3 mL (3 mLs Intravenous Given 01/31/20 2016)  sodium chloride 0.9 % bolus 1,000 mL (0 mLs Intravenous Stopped 01/31/20 2324)  promethazine (PHENERGAN) tablet 25 mg (25 mg Oral Given 01/31/20 1947)  LORazepam (ATIVAN) injection 1 mg (1 mg Intravenous Given 01/31/20 1952)  LORazepam (ATIVAN) injection 1 mg (1 mg Intravenous Given 01/31/20 2329)  gadobutrol (GADAVIST) 1 MMOL/ML injection 7.5 mL (7.5 mLs Intravenous Contrast Given 02/01/20 0055)    ED Course  I have reviewed the triage vital signs and the nursing notes.  Pertinent labs & imaging results that were available during my care of the patient were reviewed by me and considered in my medical decision making (see chart for details).  Clinical Course as of Feb 01 103  Sat Jan 31, 2020  2206 RN reports that patient got previously ordered on-call dose of Ativan earlier she was very anxious.  It appears she got that at about 8 PM.  RN reports that she was not significantly sedated after that.  She is not benzodiazepine nave.  Additional dose of Ativan ordered for MRI.   [EH]    Clinical Course User Index [EH] Ollen Gross   MDM Rules/Calculators/A&P                     Patient is a 39 year old woman who presents today for evaluation after a near syncopal event.  She had an episode of feeling like her vision was tunneling in which caused her to feel anxious.  Those symptoms resolved, however she still feels like her right eye is abnormal, but she is able to describe it is as cold.  Labs are obtained and reviewed without cause for her symptoms found. Opponent is not elevated.  Pregnancy test is negative.  Chest x-ray obtained without evidence of acute abnormalities. She is not significantly orthostatic. I spoke with Dr. Malen Gauze of neurology who recommended MRI brain  with and without. MRI brain is ordered.  In addition she has multiple adverse reactions to migraine type medications.  She does not tolerate it with Phenergan IV, however tolerated p.o.  This was ordered.  She was given IV fluids.  Will hold off on any NSAIDs until MRI results.   At shift change care was transferred to [facility] who will follow pending studies, re-evaulate and determine disposition.      Final Clinical Impression(s) / ED Diagnoses Final diagnoses:  Abnormal vision    Rx / DC Orders ED Discharge Orders    None       Ollen Gross 02/01/20 0104    Blanchie Dessert, MD 02/02/20 2110

## 2020-02-01 ENCOUNTER — Encounter: Payer: Self-pay | Admitting: Neurology

## 2020-02-01 ENCOUNTER — Encounter: Payer: Self-pay | Admitting: Family Medicine

## 2020-02-01 MED ORDER — GADOBUTROL 1 MMOL/ML IV SOLN
7.5000 mL | Freq: Once | INTRAVENOUS | Status: AC | PRN
  Administered 2020-02-01: 7.5 mL via INTRAVENOUS

## 2020-02-01 NOTE — Discharge Instructions (Addendum)
No cause for your symptoms was identified tonight.  Your MRI tonight was normal.  We recommend that you follow-up with your regular doctor and with your neurologist.  If your symptoms change or worsen, please return to the ER.

## 2020-02-01 NOTE — ED Notes (Signed)
Patient verbalizes understanding of discharge instructions. Opportunity for questioning and answers were provided. Armband removed by staff, pt discharged from ED. Pt. ambulatory and discharged home.  

## 2020-02-01 NOTE — ED Provider Notes (Signed)
Patient reassessed.  Her symptoms have almost completely resolved.  MRI is normal.  I question complex migraine, but will recommend that patient follow-up with her neurologist.  Patient understands and agrees with the plan.   Montine Circle, PA-C 02/01/20 [place], Delice Bison, DO 02/01/20 0145

## 2020-02-02 MED ORDER — ZONISAMIDE 50 MG PO CAPS: 50.0000 mg | ORAL_CAPSULE | Freq: Every day | ORAL | 1 refills | Status: DC

## 2020-02-05 ENCOUNTER — Other Ambulatory Visit: Payer: BC Managed Care – PPO | Admitting: Orthotics

## 2020-02-05 ENCOUNTER — Encounter: Payer: Self-pay | Admitting: Family Medicine

## 2020-02-09 ENCOUNTER — Ambulatory Visit: Payer: BC Managed Care – PPO | Admitting: Podiatry

## 2020-02-11 ENCOUNTER — Other Ambulatory Visit: Payer: Self-pay

## 2020-02-11 ENCOUNTER — Encounter: Payer: Self-pay | Admitting: Family Medicine

## 2020-02-12 ENCOUNTER — Encounter: Payer: Self-pay | Admitting: Neurology

## 2020-02-13 ENCOUNTER — Encounter: Payer: Self-pay | Admitting: Family Medicine

## 2020-02-14 ENCOUNTER — Other Ambulatory Visit: Payer: Self-pay | Admitting: Neurology

## 2020-02-20 ENCOUNTER — Encounter: Payer: Self-pay | Admitting: Neurology

## 2020-02-23 ENCOUNTER — Encounter: Payer: Self-pay | Admitting: Family Medicine

## 2020-02-24 NOTE — Telephone Encounter (Signed)
Please read

## 2020-02-24 NOTE — Telephone Encounter (Signed)
Please let the patient know I will address all her questions during the visit. It is not possible to answer detailed questions on MyChart.

## 2020-02-24 NOTE — Telephone Encounter (Signed)
Sent message to patient to get more information

## 2020-02-24 NOTE — Telephone Encounter (Signed)
Ok pt will make any appt

## 2020-02-25 NOTE — Telephone Encounter (Signed)
Please review

## 2020-02-25 NOTE — Telephone Encounter (Signed)
This shows PVC's, which are benign extra beats.   LC/JG: I can see her on 4/14 ine a new patient slot.  Thanks MJP

## 2020-02-26 ENCOUNTER — Encounter: Payer: Self-pay | Admitting: Cardiology

## 2020-02-26 ENCOUNTER — Other Ambulatory Visit: Payer: Self-pay

## 2020-02-26 ENCOUNTER — Ambulatory Visit: Payer: BC Managed Care – PPO | Admitting: Cardiology

## 2020-02-26 VITALS — BP 109/78 | HR 82 | Temp 98.6°F | Ht 62.0 in | Wt 150.0 lb

## 2020-02-26 DIAGNOSIS — R002 Palpitations: Secondary | ICD-10-CM

## 2020-02-26 DIAGNOSIS — I493 Ventricular premature depolarization: Secondary | ICD-10-CM | POA: Insufficient documentation

## 2020-02-26 MED ORDER — METOPROLOL TARTRATE 25 MG PO TABS: 25.0000 mg | ORAL_TABLET | Freq: Two times a day (BID) | ORAL | 2 refills | Status: DC

## 2020-02-26 NOTE — Progress Notes (Signed)
Follow up visit  Subjective:   Debra Nelson, female    DOB: [birth year], 39 y.o.   MRN: [id number]   HPI  Chief Complaint  Patient presents with  . Palpitations    pt feels faint  . Follow-up  . Loss of Consciousness    39 y.o. Caucasian female with GERD, bipolar disorder, PTSD, asthma, chronic migraines, narcolepsy cataplexy syndrome, anxiety, returns for evaluation of palpitations.  Patient's cardiac workup thus far, has returned with low exercise capacity on exercise treadmill stress test-achieving 4.7 METS-without any ischemic changes on EKG., event monitor with occasional symptomatic PVC's. Patient recently had an ED visit with an episode of syncope that was preceded by tunneled vision, lightheadedness. ED workup was unremarkable.  This was thought to be presence of complex migraine.  Patient returns for follow up today. Patient has had multiple questions over the last several weeks on MyChart.  She has been having multiple episodes of dizziness and palpitations.  She has been tracking her EKG on on monitor which does show several PVCs.  She has been working with her psychiatrist regarding her management of anxiety.  Current Outpatient Medications on File Prior to Visit  Medication Sig Dispense Refill  . ALPRAZolam (XANAX) 0.5 MG tablet Take 0.5 mg by mouth as needed for anxiety.     . Armodafinil 150 MG tablet Take 1 tablet (150 mg total) by mouth daily. 30 tablet 4  . busPIRone (BUSPAR) 30 MG tablet 15 mg 2 (two) times daily.     Marland Kitchen EPIPEN 2-PAK 0.3 MG/0.3ML SOAJ injection as needed.    . fluticasone (FLONASE SENSIMIST) 27.5 MCG/SPRAY nasal spray Place 2 sprays into the nose daily.    Marland Kitchen gabapentin (NEURONTIN) 300 MG capsule Take 300 mg by mouth 3 (three) times daily.    Marland Kitchen lamoTRIgine (LAMICTAL) 150 MG tablet Take 150 mg by mouth 2 (two) times daily.     Marland Kitchen levalbuterol (XOPENEX HFA) 45 MCG/ACT inhaler Inhale 1-2 puffs into the lungs every 6 (six) hours as needed for  wheezing (if having issues with high heart rate on albuterol). 1 Inhaler 5  . magnesium oxide (MAG-OX) 400 MG tablet Take 400 mg by mouth every evening.    . Multiple Vitamins-Minerals (HAIR SKIN AND NAILS FORMULA PO) Take 1 tablet by mouth daily.     . Olopatadine HCl 0.6 % SOLN Place 1 spray into both nostrils in the morning and at bedtime.     . pantoprazole (PROTONIX) 40 MG tablet Take 1 tablet (40 mg total) by mouth daily. 30 tablet 1  . promethazine (PHENERGAN) 25 MG tablet TAKE ONE TABLET BY MOUTH EVERY 6 HOURS AS NEEDED FOR NAUSEA AND VOMITING 90 tablet 0  . propranolol (INDERAL) 20 MG tablet TAKE ONE TABLET BY MOUTH TWICE A DAY (Patient taking differently: Take 20 mg by mouth 2 (two) times daily. ) 180 tablet 1  . pyridoxine (B-6) 100 MG tablet Take 100 mg by mouth in the morning and at bedtime.     . Sodium Oxybate 500 MG/ML SOLN Take 3.5gms at bedtime. Take 4.5gms 2 hours later. Total of 8gms per night. Dispense 364m. 360 mL 5  . SUMAtriptan (IMITREX) 100 MG tablet TAKE 1/2 TO 1  TABLET BY MOUTH AT ONSET OF HEADACHE; MAY REPEAT ONE TABLET IN 2 HOURS IF NEEDED. (Patient taking differently: Take 50-100 mg by mouth every 2 (two) hours as needed for headache. ) 10 tablet 5  . Thiamine HCl (VITAMIN B-1) 250 MG tablet Take  250 mg by mouth daily.    . vitamin B-12 (CYANOCOBALAMIN) 250 MCG tablet Take 250 mcg by mouth in the morning and at bedtime.     Marland Kitchen albuterol (2.5 MG/3ML) 0.083% NEBU 3 mL, albuterol (5 MG/ML) 0.5% NEBU 0.5 mL Inhale into the lungs daily as needed.    Marland Kitchen albuterol (PROAIR HFA) 108 (90 Base) MCG/ACT inhaler Inhale 2 puffs into the lungs every 6 (six) hours as needed for wheezing or shortness of breath.    . ARNUITY ELLIPTA 200 MCG/ACT AEPB Inhale 1 puff into the lungs daily.    . cyclobenzaprine (FLEXERIL) 10 MG tablet Take 10 mg by mouth as needed.    . desloratadine (CLARINEX) 5 MG tablet Take 5 mg by mouth daily.    . IPRATROPIUM BROMIDE HFA IN Inhale into the lungs.      No current facility-administered medications on file prior to visit.    Cardiovascular & other pertient studies:  EKG 02/26/2020: Sinus rhythm 80 bpm. Low voltage in precordial leads.  Nonsepcific ST-T changes.  Event monitor 12/22/2019 - 01/20/2020: Diagnostic time: 78%  Dominant rhythm: Sinus. HR 55-158 bpm. Avg HR 80 bpm. Occasional episodes of PVC correlate with flutter sensation/chest pain/shortness of breath. No atrial fibrillation/atrial flutter/SVT/VT/high grade AV block, sinus pause >3sec noted.  Exercise treadmill stress test 10/06/2019: Exercise treadmill stress test performed using Bruce protocol.  Patient reached 4.7 METS, and 98% of age predicted maximum heart rate.  Exercise capacity was low.  No chest pain reported.  Exaggerated heart rate response, normal hemodynamic response. Stress EKG revealed no ischemic changes. Normal exercise treadmill stress test with low exercise capacity.  Echocardiogram 09/03/2019:  Left ventricle cavity is normal in size. Normal left ventricular wall  thickness. Normal LV systolic function with EF 55%. Normal global wall  motion. Normal diastolic filling pattern.  Mild tricuspid regurgitation.  No evidence of pulmonary hypertension.  Recent labs: Feb-March 2021: Glucose 125, BUN/Cr 6/0.9. EGFR >60. Na/K 138/4.6. Rest of the CMP normal H/H 15/45. MCV 95. Platelets 388 TSH 0.72 normal  2019: HbA1C 4.9% Chol 170, TG 91, HDL 57, LDL 95   Results for KRISANDRA, BUENO (MRN [id number]) as of 02/26/2020 08:37  Ref. Range 01/31/2020 14:49 01/31/2020 16:55  Troponin I (High Sensitivity) Latest Ref Range: <18 ng/L <2 2      Review of Systems  Cardiovascular: Positive for palpitations. Negative for chest pain, dyspnea on exertion, leg swelling and syncope.  Psychiatric/Behavioral: The patient is nervous/anxious.          Vitals:   02/26/20 0940  BP: 109/78  Pulse: 82  Temp: 98.6 F (37 C)  SpO2: 100%   Orthostatic VS for the  past 72 hrs (Last 3 readings):  Orthostatic BP Patient Position BP Location Cuff Size Orthostatic Pulse  02/26/20 0955 116/81 Standing Left Arm Normal 83  02/26/20 0954 111/79 Sitting Left Arm Normal 81  02/26/20 0953 112/78 Supine Left Arm Normal 78     Body mass index is 27.44 kg/m. Filed Weights   02/26/20 0940  Weight: 150 lb (68 kg)     Objective:   Physical Exam  Constitutional: She appears well-developed and well-nourished.  Neck: No JVD present.  Cardiovascular: Normal rate, regular rhythm, normal heart sounds and intact distal pulses.  No murmur heard. Pulmonary/Chest: Effort normal and breath sounds normal. She has no wheezes. She has no rales.  Musculoskeletal:        General: No edema.  Nursing note and vitals reviewed.  Assessment & Recommendations:   39 y.o. Caucasian female with GERD, bipolar disorder, PTSD, asthma, chronic migraines, narcolepsy cataplexy syndrome, anxiety, now with symptomatic PVCs  Symptomatic PVCs: Change propranolol to metoprolol tartrate 25 mg twice daily.  She may also have a component of inappropriate sinus tachycardia, which may also be treated by metoprolol.  I have explained to the patient that it is not possible for me to reply to my chart messages in real-time, when the questions need detailed discussion and potentially an office visit.  Patient verbalized understanding.  I have encouraged her to continue regular hydration and exercise.  Continue management of anxiety as per psychiatrist recommendations.  I will see her back in 6 weeks.    Nigel Mormon, MD [facility] Pager: [phone] Office: [phone]

## 2020-03-03 ENCOUNTER — Ambulatory Visit: Payer: BC Managed Care – PPO | Admitting: Cardiology

## 2020-03-04 ENCOUNTER — Encounter: Payer: Self-pay | Admitting: Family Medicine

## 2020-03-12 IMAGING — MG DIGITAL DIAGNOSTIC BILAT W/ TOMO W/ CAD
6 of 10 series · 6 of 30 positions shown · non-contrast
Comparison: Reports from previous ultrasound examinations in 1556.

CLINICAL DATA: Mass felt on recent physical examination in the
upper outer right breast.

EXAM:
DIGITAL DIAGNOSTIC BILATERAL MAMMOGRAM WITH CAD AND TOMO
ULTRASOUND RIGHT BREAST

[R MLO synth-2D]
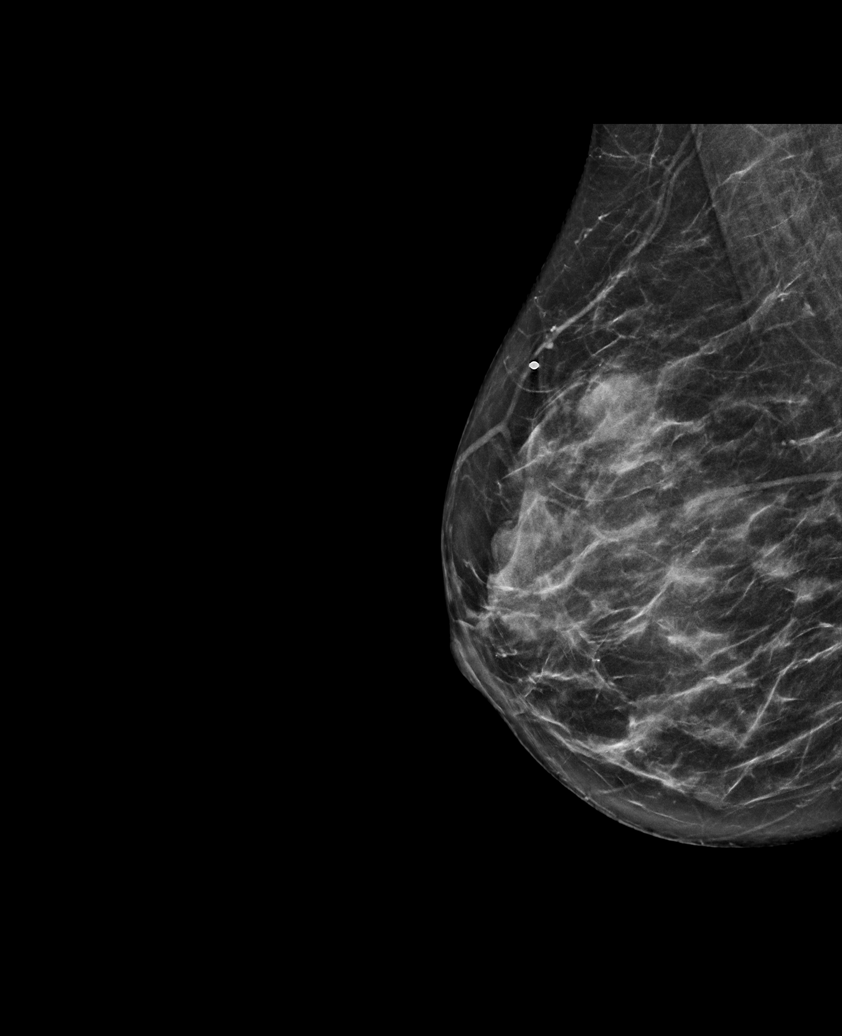

[L MLO synth-2D]
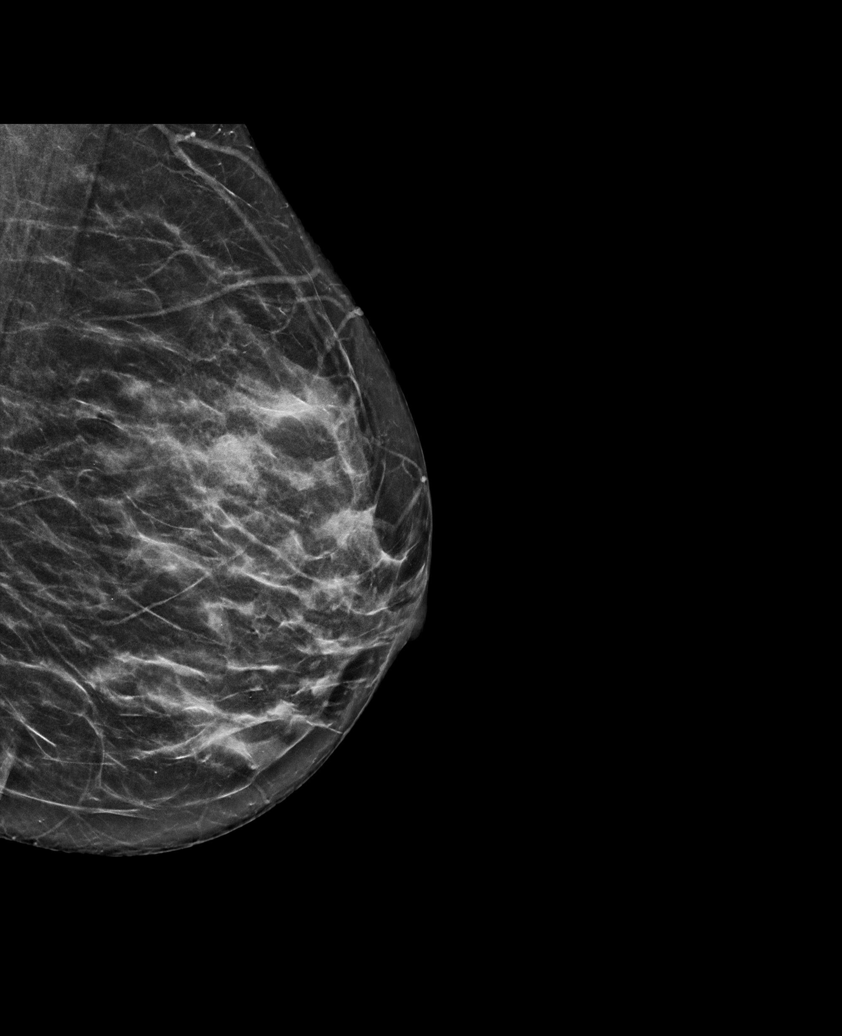

[R TAN synth-2D]
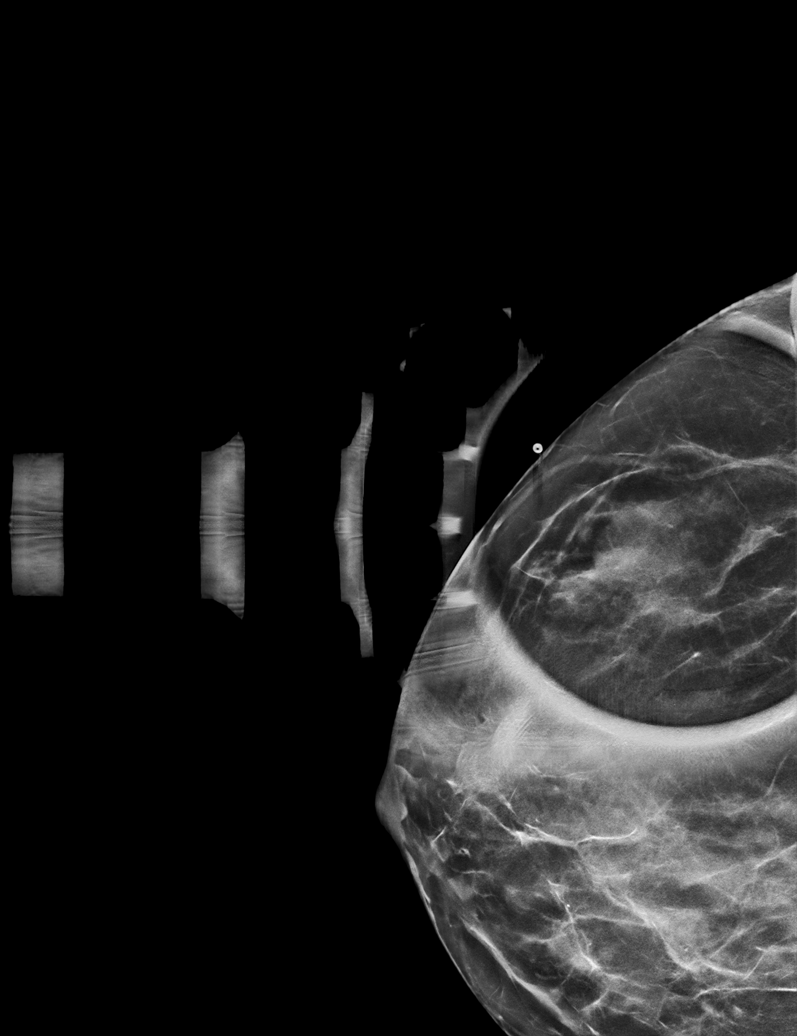

[L CC synth-2D]
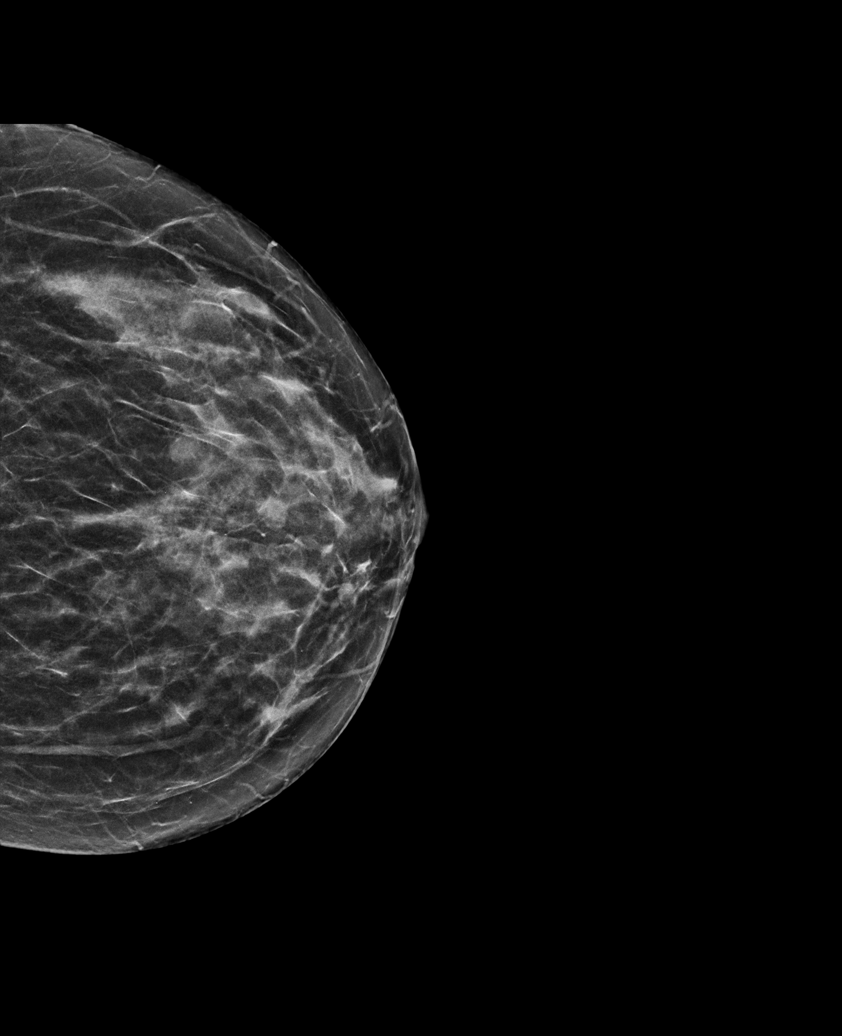

[R CC synth-2D]
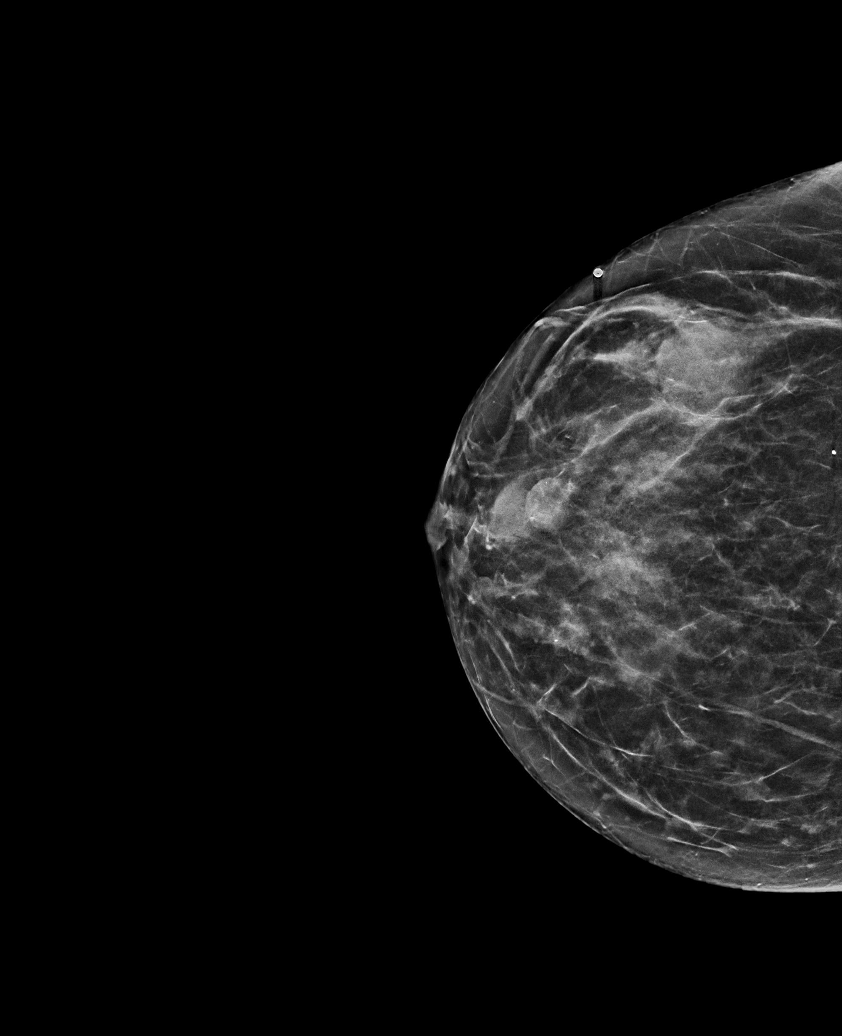

[R TAN tomo · tomo slice 27/53.0]
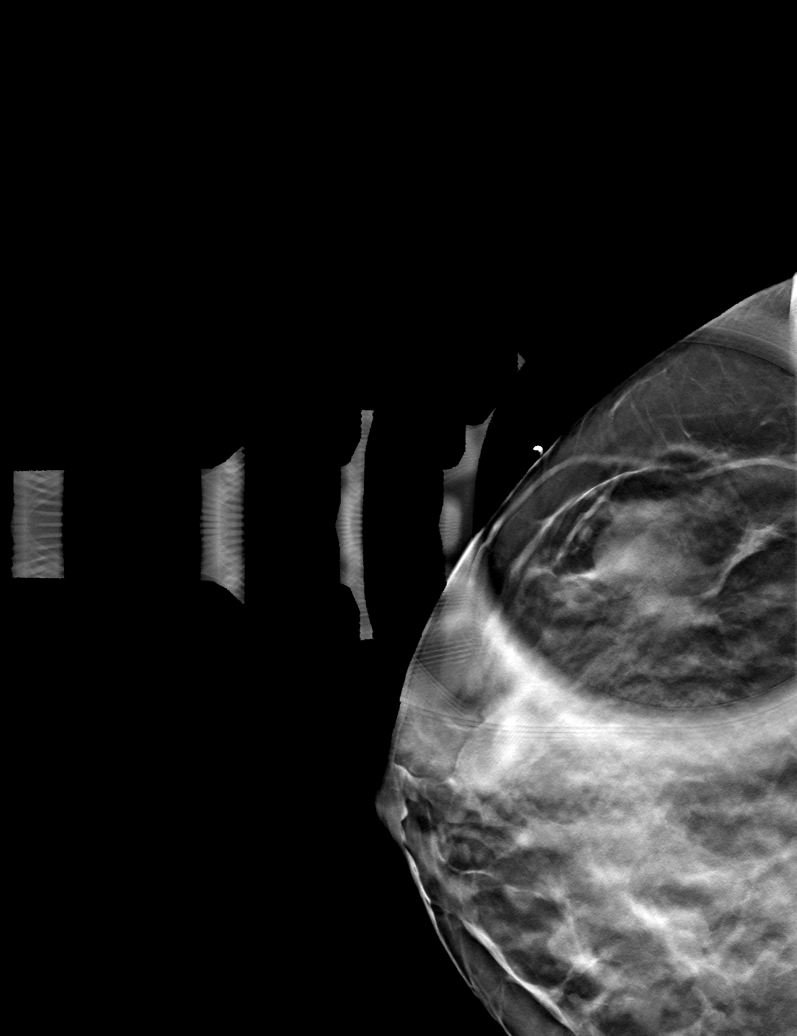

[6 of 30 positions shown; findings below may reference images not displayed]

ACR Breast Density Category c: The breast tissue is heterogeneously
dense, which may obscure small masses.
FINDINGS: There are multiple rounded, circumscribed masses in both breasts.
These include a rounded, circumscribed mass at the location of the
recently felt mass the right, marked with a metallic marker. No
mammographic findings suspicious for malignancy in either breast.

Mammographic images were processed with CAD.

On physical exam, there is an approximately 1.5 cm palpable mass in
the 10:30 o'clock position of the right breast, 5 cm from the
nipple, corresponding to the mass felt on recent physical
examination.

Targeted ultrasound is performed, showing a 1.9 cm cyst containing
dependent debris in the 10:30 o'clock position of the right breast,
5 cm from the nipple. No internal blood flow was seen with power
Doppler. There are multiple additional simple cysts in that region
of the breast.
IMPRESSION: 1. Multiple benign right breast cysts, including the recently
palpated mass.
2. Multiple additional probable cysts in both breasts
mammographically. The multiplicity and circumscribed margins are
compatible with benign process, not requiring further evaluation.

RECOMMENDATION:
Bilateral screening mammogram in 1 year.

I have discussed the findings and recommendations with the patient.
If applicable, a reminder letter will be sent to the patient
regarding the next appointment.

BI-RADS CATEGORY  2: Benign.

## 2020-03-15 ENCOUNTER — Ambulatory Visit: Payer: BC Managed Care – PPO | Admitting: Cardiology

## 2020-03-22 ENCOUNTER — Encounter: Payer: Self-pay | Admitting: Family Medicine

## 2020-03-22 NOTE — Progress Notes (Signed)
Phone [phone] Virtual visit via Video note   Subjective:  Chief complaint: Chief Complaint  Patient presents with  . Follow-up  . sinus issues    This visit type was conducted due to national recommendations for restrictions regarding the COVID-19 Pandemic (e.g. social distancing).  This format is felt to be most appropriate for this patient at this time balancing risks to patient and risks to population by having him in for in person visit.  No physical exam was performed (except for noted visual exam or audio findings with Telehealth visits).    Our team/I connected with [name] at  8:40 AM EDT by a video enabled telemedicine application ([email or web address] or caregility through epic) and verified that I am speaking with the correct person using two identifiers.  Location patient: [place] Location provider: [facility], office Persons participating in the virtual visit:  patient  Our team/I discussed the limitations of evaluation and management by telemedicine and the availability of in person appointments. In light of current covid-19 pandemic, patient also understands that we are trying to protect them by minimizing in office contact if at all possible.  The patient expressed consent for telemedicine visit and agreed to proceed. Patient understands insurance will be billed.   Past Medical History-  Patient Active Problem List   Diagnosis Date Noted  . Chronic post-traumatic stress disorder (PTSD) 02/26/2017    Priority: High  . Narcolepsy cataplexy syndrome 03/06/2014    Priority: High  . Bipolar II disorder ([place]) 12/31/2013    Priority: High  . Symptomatic PVCs 02/26/2020    Priority: Medium  . Asthma 10/27/2016    Priority: Medium  . GERD (gastroesophageal reflux disease) 10/27/2016    Priority: Medium  . Intractable migraine with aura with status migrainosus 10/19/2015    Priority: Medium  . Recurrent isolated sleep paralysis 07/02/2018    Priority: Low  .  Menstrual migraine without status migrainosus, not intractable 01/02/2018    Priority: Low  . Chronic thumb pain, bilateral 11/23/2017    Priority: Low  . Elevated TSH 05/07/2017    Priority: Low  . Trigger thumb, left thumb 03/15/2017    Priority: Low  . Sensorineural hearing loss (SNHL) of both ears 12/12/2016    Priority: Low  . Seasonal allergic rhinitis 05/02/2016    Priority: Low  . Right ear pain 05/02/2016    Priority: Low  . Dysfunction of both eustachian tubes 05/02/2016    Priority: Low  . Chondromalacia of knee 03/07/2013    Priority: Low  . Atypical chest pain 10/06/2019  . Sinus tachycardia 08/27/2019  . Shortness of breath 08/27/2019    Medications- reviewed and updated Current Outpatient Medications  Medication Sig Dispense Refill  . albuterol (2.5 MG/3ML) 0.083% NEBU 3 mL, albuterol (5 MG/ML) 0.5% NEBU 0.5 mL Inhale into the lungs daily as needed.    Marland Kitchen albuterol (PROAIR HFA) 108 (90 Base) MCG/ACT inhaler Inhale 2 puffs into the lungs every 6 (six) hours as needed for wheezing or shortness of breath.    . ALPRAZolam (XANAX) 0.5 MG tablet Take 0.5 mg by mouth as needed for anxiety.     . Armodafinil 150 MG tablet Take 1 tablet (150 mg total) by mouth daily. 30 tablet 4  . ARNUITY ELLIPTA 200 MCG/ACT AEPB Inhale 1 puff into the lungs daily.    . busPIRone (BUSPAR) 30 MG tablet 15 mg 2 (two) times daily.     . cyclobenzaprine (FLEXERIL) 10 MG tablet Take 10 mg  by mouth as needed.    . desloratadine (CLARINEX) 5 MG tablet Take 5 mg by mouth daily.    Marland Kitchen EPIPEN 2-PAK 0.3 MG/0.3ML SOAJ injection as needed.    . fluticasone (FLONASE SENSIMIST) 27.5 MCG/SPRAY nasal spray Place 2 sprays into the nose daily.    Marland Kitchen gabapentin (NEURONTIN) 300 MG capsule Take 300 mg by mouth 3 (three) times daily.    . IPRATROPIUM BROMIDE HFA IN Inhale into the lungs.    . lamoTRIgine (LAMICTAL) 150 MG tablet Take 150 mg by mouth 2 (two) times daily.     Marland Kitchen levalbuterol (XOPENEX HFA) 45 MCG/ACT  inhaler Inhale 1-2 puffs into the lungs every 6 (six) hours as needed for wheezing (if having issues with high heart rate on albuterol). 1 Inhaler 5  . loratadine (CLARITIN) 10 MG tablet Take 10 mg by mouth daily.    . magnesium oxide (MAG-OX) 400 MG tablet Take 400 mg by mouth every evening.    . metoprolol tartrate (LOPRESSOR) 25 MG tablet Take 1 tablet (25 mg total) by mouth 2 (two) times daily. 90 tablet 2  . Multiple Vitamins-Minerals (HAIR SKIN AND NAILS FORMULA PO) Take 1 tablet by mouth daily.     . Olopatadine HCl 0.6 % SOLN Place 1 spray into both nostrils in the morning and at bedtime.     . pantoprazole (PROTONIX) 40 MG tablet Take 1 tablet (40 mg total) by mouth daily. 30 tablet 1  . promethazine (PHENERGAN) 25 MG tablet TAKE ONE TABLET BY MOUTH EVERY 6 HOURS AS NEEDED FOR NAUSEA AND VOMITING 90 tablet 0  . pyridoxine (B-6) 100 MG tablet Take 100 mg by mouth in the morning and at bedtime.     . Sodium Oxybate 500 MG/ML SOLN Take 3.5gms at bedtime. Take 4.5gms 2 hours later. Total of 8gms per night. Dispense 349ml. 360 mL 5  . SUMAtriptan (IMITREX) 100 MG tablet TAKE 1/2 TO 1  TABLET BY MOUTH AT ONSET OF HEADACHE; MAY REPEAT ONE TABLET IN 2 HOURS IF NEEDED. (Patient taking differently: Take 50-100 mg by mouth every 2 (two) hours as needed for headache. ) 10 tablet 5  . Thiamine HCl (VITAMIN B-1) 250 MG tablet Take 250 mg by mouth daily.    . vitamin B-12 (CYANOCOBALAMIN) 250 MCG tablet Take 250 mcg by mouth in the morning and at bedtime.     . ondansetron (ZOFRAN-ODT) 4 MG disintegrating tablet Take 1 tablet (4 mg total) by mouth every 8 (eight) hours as needed for nausea or vomiting. 20 tablet 0   No current facility-administered medications for this visit.     Objective:  BP 120/81   Pulse 87   Ht 5\' 2"  (1.575 m)   Wt 150 lb (68 kg)   BMI 27.44 kg/m  self reported vitals Gen: NAD, appears fatigued Lungs: nonlabored, normal respiratory rate  Skin: appears dry, no obvious  rash     Assessment and Plan   #Sinus Issues #Allergic rhinitis S:Patient reports her allergies have gotten really bad. She saw her allergist who took her off xyzal and put her on claritin am, clarinex at night, then olopatodine and flonase sprays. She has continued to have a lot of congestion and drainage.  Patient was seeing Dr. Paulla Fore for [facility] issues but mention sinus issues to him which he began treatment for.  She is on day 8 of antibiotics and finished prednisone the week prior through DR. Paulla Fore. Per her mychart " I think he also saw just  a small polyp and fluid on the ears which tends to stay there. Ears are hurting like normal. No loss of taste or smell. No fever. Sore throat but that's expected with drainage. " has been told had eustachian tube dysfunction. Congestion can make her feel nauseated. Mornings can be really bad. Had sinus headache but that has improved as of today- she felt like she was getting better with antibiotics as below - but then is concerned by sinus headaches over weekend and increased drainage. Has not tried mucinex A/P: 39 year old female with worsened sinus congestion/drainage/headaches suspicious for bacterial sinusitis despite seeing an allergist and being on good allergy treatment with Claritin in a.m., Clarinex and p.m., olopatadine and Flonase. -Patient halfway through Augmentin 2-week course.  Has already completed prednisone course through Dr. Paulla Fore.  Encouraged her to complete regimen -I told her I thought the drainage was actually a good sign-I would like to continue to see her have drainage-we will try Mucinex and she will try to remain well-hydrated for this.  Discussed avoiding decongestant -She would like some Zofran for the nausea portion of her symptoms which I sent in -For allergy portion we will continue current medications.  I recommended considering NeilMed sinus rinse but she has not tolerated this in the past and actually it has caused  headaches. -No significant wheezing or increased shortness of breath to suggest asthma exacerbation.  Continue as needed Xopenex as well as Arnuity from allergist  We discussed if symptoms fail to continue to improve or worsen or if persist - cefixime 400mg  daily or cefpodoxime 200mg  twice daily would be my next step -ENT referral potentially- but she may check in with allergist first  Patient with symptoms concerning for potential covid 19 though- has already been vaccinated so lower risk Therefore: -information provided on testing scheduling "please text "COVID" to [phone], OR you can log on to [email or web address] to easily make an on-line appointment. "  - recommended patient watch closely for shortness of breath or confusion or worsening symptoms and if those occur he should contact us immediately  -recommended self isolation until negative test  at minimum .   # GERD S: Has been well controlled without meds for at least a year. With allergy season kicked back up, had to start back on protonix 40mg - also taking pepcid and still gets some breakthrough A/P: Mild poor control.  We discussed the patient's symptoms do not improve as we continue to treat her sinus symptoms that I would like for her to see GI.  I do not like that she is having some breakthrough despite Protonix and Pepcid but since she ties this so close it allergies and sinus congestion we are going to try to alleviate that first  Recommended follow up: As needed for acute concerns Future Appointments  Date Time Provider [place]  04/07/2020  9:30 AM Patwardhan, Reynold Bowen, MD [facility] None  07/14/2020  9:30 AM Ward Givens, NP [facility] None   Lab/Order associations:   ICD-10-CM   1. Bacterial sinusitis  J32.9    B96.89   2. Gastroesophageal reflux disease without esophagitis  K21.9   3. Seasonal allergic rhinitis due to pollen  J30.1   4. Severe persistent asthma without complication  [id number]     Meds ordered  this encounter  Medications  . ondansetron (ZOFRAN-ODT) 4 MG disintegrating tablet    Sig: Take 1 tablet (4 mg total) by mouth every 8 (eight) hours as needed for nausea or vomiting.  Dispense:  20 tablet    Refill:  0   Return precautions advised.  Garret Reddish, MD

## 2020-03-22 NOTE — Patient Instructions (Signed)
There are no preventive care reminders to display for this patient. Depression screen [facility] 2/9 08/25/2019 04/07/2015  Decreased Interest 1 2  Down, Depressed, Hopeless 1 2  PHQ - 2 Score 2 4  Altered sleeping 0 1  Tired, decreased energy 0 3  Change in appetite 1 2  Feeling bad or failure about yourself  0 1  Trouble concentrating 3 1  Moving slowly or fidgety/restless 0 0  Suicidal thoughts 0 2  PHQ-9 Score 6 14  Difficult doing work/chores Somewhat difficult Somewhat difficult

## 2020-03-23 ENCOUNTER — Encounter: Payer: Self-pay | Admitting: Family Medicine

## 2020-03-23 ENCOUNTER — Telehealth (INDEPENDENT_AMBULATORY_CARE_PROVIDER_SITE_OTHER): Payer: BC Managed Care – PPO | Admitting: Family Medicine

## 2020-03-23 VITALS — BP 120/81 | HR 87 | Ht 62.0 in | Wt 150.0 lb

## 2020-03-23 DIAGNOSIS — J301 Allergic rhinitis due to pollen: Secondary | ICD-10-CM | POA: Diagnosis not present

## 2020-03-23 DIAGNOSIS — B9689 Other specified bacterial agents as the cause of diseases classified elsewhere: Secondary | ICD-10-CM

## 2020-03-23 DIAGNOSIS — J4550 Severe persistent asthma, uncomplicated: Secondary | ICD-10-CM | POA: Diagnosis not present

## 2020-03-23 DIAGNOSIS — K219 Gastro-esophageal reflux disease without esophagitis: Secondary | ICD-10-CM | POA: Diagnosis not present

## 2020-03-23 DIAGNOSIS — J329 Chronic sinusitis, unspecified: Secondary | ICD-10-CM

## 2020-03-23 MED ORDER — ONDANSETRON 4 MG PO TBDP: 4.0000 mg | ORAL_TABLET | Freq: Three times a day (TID) | ORAL | 0 refills | Status: DC | PRN

## 2020-03-29 ENCOUNTER — Encounter: Payer: Self-pay | Admitting: Family Medicine

## 2020-03-29 DIAGNOSIS — K219 Gastro-esophageal reflux disease without esophagitis: Secondary | ICD-10-CM

## 2020-03-29 MED ORDER — PANTOPRAZOLE SODIUM 40 MG PO TBEC: 40.0000 mg | DELAYED_RELEASE_TABLET | Freq: Every day | ORAL | 1 refills | Status: DC

## 2020-04-03 ENCOUNTER — Other Ambulatory Visit: Payer: Self-pay | Admitting: Family Medicine

## 2020-04-03 NOTE — Progress Notes (Deleted)
Follow up visit  Subjective:   Debra Nelson, female    DOB: 04/12/1981, 39 y.o.   MRN: [id number]   HPI  No chief complaint on file.   39 y.o. Caucasian female with GERD, bipolar disorder, PTSD, asthma, chronic migraines, narcolepsy cataplexy syndrome, anxiety, returns for evaluation of palpitations.  Patient's cardiac workup thus far, has returned with low exercise capacity on exercise treadmill stress test-achieving 4.7 METS-without any ischemic changes on EKG., event monitor with occasional symptomatic PVC's. Patient recently had an ED visit with an episode of syncope that was preceded by tunneled vision, lightheadedness. ED workup was unremarkable.  This was thought to be presence of complex migraine.  Patient returns for follow up today. Patient has had multiple questions over the last several weeks on MyChart.  She has been having multiple episodes of dizziness and palpitations.  She has been tracking her EKG on on monitor which does show several PVCs.  She has been working with her psychiatrist regarding her management of anxiety.  Current Outpatient Medications on File Prior to Visit  Medication Sig Dispense Refill  . albuterol (2.5 MG/3ML) 0.083% NEBU 3 mL, albuterol (5 MG/ML) 0.5% NEBU 0.5 mL Inhale into the lungs daily as needed.    Marland Kitchen albuterol (PROAIR HFA) 108 (90 Base) MCG/ACT inhaler Inhale 2 puffs into the lungs every 6 (six) hours as needed for wheezing or shortness of breath.    . ALPRAZolam (XANAX) 0.5 MG tablet Take 0.5 mg by mouth as needed for anxiety.     . Armodafinil 150 MG tablet Take 1 tablet (150 mg total) by mouth daily. 30 tablet 4  . ARNUITY ELLIPTA 200 MCG/ACT AEPB Inhale 1 puff into the lungs daily.    . busPIRone (BUSPAR) 30 MG tablet 15 mg 2 (two) times daily.     . cyclobenzaprine (FLEXERIL) 10 MG tablet Take 10 mg by mouth as needed.    . desloratadine (CLARINEX) 5 MG tablet Take 5 mg by mouth daily.    Marland Kitchen EPIPEN 2-PAK 0.3 MG/0.3ML SOAJ injection  as needed.    . fluticasone (FLONASE SENSIMIST) 27.5 MCG/SPRAY nasal spray Place 2 sprays into the nose daily.    Marland Kitchen gabapentin (NEURONTIN) 300 MG capsule Take 300 mg by mouth 3 (three) times daily.    . IPRATROPIUM BROMIDE HFA IN Inhale into the lungs.    . lamoTRIgine (LAMICTAL) 150 MG tablet Take 150 mg by mouth 2 (two) times daily.     Marland Kitchen levalbuterol (XOPENEX HFA) 45 MCG/ACT inhaler Inhale 1-2 puffs into the lungs every 6 (six) hours as needed for wheezing (if having issues with high heart rate on albuterol). 1 Inhaler 5  . loratadine (CLARITIN) 10 MG tablet Take 10 mg by mouth daily.    . magnesium oxide (MAG-OX) 400 MG tablet Take 400 mg by mouth every evening.    . metoprolol tartrate (LOPRESSOR) 25 MG tablet Take 1 tablet (25 mg total) by mouth 2 (two) times daily. 90 tablet 2  . Multiple Vitamins-Minerals (HAIR SKIN AND NAILS FORMULA PO) Take 1 tablet by mouth daily.     . Olopatadine HCl 0.6 % SOLN Place 1 spray into both nostrils in the morning and at bedtime.     . ondansetron (ZOFRAN-ODT) 4 MG disintegrating tablet Take 1 tablet (4 mg total) by mouth every 8 (eight) hours as needed for nausea or vomiting. 20 tablet 0  . pantoprazole (PROTONIX) 40 MG tablet Take 1 tablet (40 mg total) by mouth daily. [place]  tablet 1  . promethazine (PHENERGAN) 25 MG tablet TAKE ONE TABLET BY MOUTH EVERY 6 HOURS AS NEEDED FOR NAUSEA AND VOMITING 90 tablet 0  . pyridoxine (B-6) 100 MG tablet Take 100 mg by mouth in the morning and at bedtime.     . Sodium Oxybate 500 MG/ML SOLN Take 3.5gms at bedtime. Take 4.5gms 2 hours later. Total of 8gms per night. Dispense 344m. 360 mL 5  . SUMAtriptan (IMITREX) 100 MG tablet TAKE 1/2 TO 1  TABLET BY MOUTH AT ONSET OF HEADACHE; MAY REPEAT ONE TABLET IN 2 HOURS IF NEEDED. (Patient taking differently: Take 50-100 mg by mouth every 2 (two) hours as needed for headache. ) 10 tablet 5  . Thiamine HCl (VITAMIN B-1) 250 MG tablet Take 250 mg by mouth daily.    . vitamin B-12  (CYANOCOBALAMIN) 250 MCG tablet Take 250 mcg by mouth in the morning and at bedtime.      No current facility-administered medications on file prior to visit.    Cardiovascular & other pertient studies:  EKG 02/26/2020: Sinus rhythm 80 bpm. Low voltage in precordial leads.  Nonsepcific ST-T changes.  Event monitor 12/22/2019 - 01/20/2020: Diagnostic time: 78%  Dominant rhythm: Sinus. HR 55-158 bpm. Avg HR 80 bpm. Occasional episodes of PVC correlate with flutter sensation/chest pain/shortness of breath. No atrial fibrillation/atrial flutter/SVT/VT/high grade AV block, sinus pause >3sec noted.  Exercise treadmill stress test 10/06/2019: Exercise treadmill stress test performed using Bruce protocol.  Patient reached 4.7 METS, and 98% of age predicted maximum heart rate.  Exercise capacity was low.  No chest pain reported.  Exaggerated heart rate response, normal hemodynamic response. Stress EKG revealed no ischemic changes. Normal exercise treadmill stress test with low exercise capacity.  Echocardiogram 09/03/2019:  Left ventricle cavity is normal in size. Normal left ventricular wall  thickness. Normal LV systolic function with EF 55%. Normal global wall  motion. Normal diastolic filling pattern.  Mild tricuspid regurgitation.  No evidence of pulmonary hypertension.  Recent labs: Feb-March 2021: Glucose 125, BUN/Cr 6/0.9. EGFR >60. Na/K 138/4.6. Rest of the CMP normal H/H 15/45. MCV 95. Platelets 388 TSH 0.72 normal  2019: HbA1C 4.9% Chol 170, TG 91, HDL 57, LDL 95   Results for MSHERRITA, RIEDERER(MRN [id number] as of 02/26/2020 08:37  Ref. Range 01/31/2020 14:49 01/31/2020 16:55  Troponin I (High Sensitivity) Latest Ref Range: <18 ng/L <2 2      Review of Systems  Cardiovascular: Positive for palpitations. Negative for chest pain, dyspnea on exertion, leg swelling and syncope.  Psychiatric/Behavioral: The patient is nervous/anxious.          There were no vitals filed  for this visit. No data found.   There is no height or weight on file to calculate BMI. There were no vitals filed for this visit.   Objective:   Physical Exam  Constitutional: She appears well-developed and well-nourished.  Neck: No JVD present.  Cardiovascular: Normal rate, regular rhythm, normal heart sounds and intact distal pulses.  No murmur heard. Pulmonary/Chest: Effort normal and breath sounds normal. She has no wheezes. She has no rales.  Musculoskeletal:        General: No edema.  Nursing note and vitals reviewed.         Assessment & Recommendations:   39y.o. Caucasian female with GERD, bipolar disorder, PTSD, asthma, chronic migraines, narcolepsy cataplexy syndrome, anxiety, now with symptomatic PVCs  Symptomatic PVCs: Change propranolol to metoprolol tartrate 25 mg twice daily.  She  may also have a component of inappropriate sinus tachycardia, which may also be treated by metoprolol.  I have explained to the patient that it is not possible for me to reply to my chart messages in real-time, when the questions need detailed discussion and potentially an office visit.  Patient verbalized understanding.  I have encouraged her to continue regular hydration and exercise.  Continue management of anxiety as per psychiatrist recommendations.  I will see her back in 6 weeks.    Nigel Mormon, MD [facility] Pager: [phone] Office: [phone]

## 2020-04-05 ENCOUNTER — Emergency Department (HOSPITAL_COMMUNITY)
Admission: EM | Admit: 2020-04-05 | Discharge: 2020-04-05 | Disposition: A | Discharge status: Home / Self Care | Payer: BC Managed Care – PPO | Attending: Emergency Medicine | Admitting: Emergency Medicine

## 2020-04-05 ENCOUNTER — Emergency Department (HOSPITAL_COMMUNITY): Payer: BC Managed Care – PPO

## 2020-04-05 ENCOUNTER — Encounter: Payer: Self-pay | Admitting: Neurology

## 2020-04-05 DIAGNOSIS — J45909 Unspecified asthma, uncomplicated: Secondary | ICD-10-CM | POA: Diagnosis not present

## 2020-04-05 DIAGNOSIS — R791 Abnormal coagulation profile: Secondary | ICD-10-CM | POA: Diagnosis not present

## 2020-04-05 DIAGNOSIS — R4781 Slurred speech: Secondary | ICD-10-CM | POA: Diagnosis present

## 2020-04-05 DIAGNOSIS — R202 Paresthesia of skin: Secondary | ICD-10-CM | POA: Diagnosis not present

## 2020-04-05 DIAGNOSIS — G43109 Migraine with aura, not intractable, without status migrainosus: Secondary | ICD-10-CM | POA: Diagnosis not present

## 2020-04-05 DIAGNOSIS — Z79899 Other long term (current) drug therapy: Secondary | ICD-10-CM | POA: Diagnosis not present

## 2020-04-05 LAB — CBC
HCT: 46.2 % — ABNORMAL HIGH (ref 36.0–46.0)
Hemoglobin: 15.5 g/dL — ABNORMAL HIGH (ref 12.0–15.0)
MCH: 31.5 pg (ref 26.0–34.0)
MCHC: 33.5 g/dL (ref 30.0–36.0)
MCV: 93.9 fL (ref 80.0–100.0)
Platelets: 418 10*3/uL — ABNORMAL HIGH (ref 150–400)
RBC: 4.92 MIL/uL (ref 3.87–5.11)
RDW: 12 % (ref 11.5–15.5)
WBC: 10.2 10*3/uL (ref 4.0–10.5)
nRBC: 0 % (ref 0.0–0.2)

## 2020-04-05 LAB — COMPREHENSIVE METABOLIC PANEL
ALT: 18 U/L (ref 0–44)
AST: 19 U/L (ref 15–41)
Albumin: 4.8 g/dL (ref 3.5–5.0)
Alkaline Phosphatase: 108 U/L (ref 38–126)
Anion gap: 15 (ref 5–15)
BUN: 10 mg/dL (ref 6–20)
CO2: 24 mmol/L (ref 22–32)
Calcium: 10.3 mg/dL (ref 8.9–10.3)
Chloride: 101 mmol/L (ref 98–111)
Creatinine, Ser: 0.91 mg/dL (ref 0.44–1.00)
GFR calc Af Amer: >60 mL/min (ref >60)
GFR calc non Af Amer: >60 mL/min (ref >60)
Glucose, Bld: 102 mg/dL — ABNORMAL HIGH (ref 70–99)
Potassium: 3.7 mmol/L (ref 3.5–5.1)
Sodium: 140 mmol/L (ref 135–145)
Total Bilirubin: 1.3 mg/dL — ABNORMAL HIGH (ref 0.3–1.2)
Total Protein: 8.1 g/dL (ref 6.5–8.1)

## 2020-04-05 LAB — I-STAT BETA HCG BLOOD, ED (MC, WL, AP ONLY): I-stat hCG, quantitative: <5 m[IU]/mL (ref <5)

## 2020-04-05 LAB — DIFFERENTIAL
Abs Immature Granulocytes: 0.03 10*3/uL (ref 0.00–0.07)
Basophils Absolute: 0.1 10*3/uL (ref 0.0–0.1)
Basophils Relative: 1 %
Eosinophils Absolute: 0 10*3/uL (ref 0.0–0.5)
Eosinophils Relative: 0 %
Immature Granulocytes: 0 %
Lymphocytes Relative: 24 %
Lymphs Abs: 2.5 10*3/uL (ref 0.7–4.0)
Monocytes Absolute: 0.6 10*3/uL (ref 0.1–1.0)
Monocytes Relative: 6 %
Neutro Abs: 7 10*3/uL (ref 1.7–7.7)
Neutrophils Relative %: 69 %

## 2020-04-05 LAB — CBG MONITORING, ED: Glucose-Capillary: 97 mg/dL (ref 70–99)

## 2020-04-05 LAB — PROTIME-INR
INR: 0.9 (ref 0.8–1.2)
Prothrombin Time: 12.2 seconds (ref 11.4–15.2)

## 2020-04-05 LAB — I-STAT CHEM 8, ED
BUN: 11 mg/dL (ref 6–20)
Calcium, Ion: 1.05 mmol/L — ABNORMAL LOW (ref 1.15–1.40)
Chloride: 105 mmol/L (ref 98–111)
Creatinine, Ser: 0.9 mg/dL (ref 0.44–1.00)
Glucose, Bld: 103 mg/dL — ABNORMAL HIGH (ref 70–99)
HCT: 46 % (ref 36.0–46.0)
Hemoglobin: 15.6 g/dL — ABNORMAL HIGH (ref 12.0–15.0)
Potassium: 3.7 mmol/L (ref 3.5–5.1)
Sodium: 137 mmol/L (ref 135–145)
TCO2: 26 mmol/L (ref 22–32)

## 2020-04-05 LAB — APTT: aPTT: 23 seconds — ABNORMAL LOW (ref 24–36)

## 2020-04-05 MED ORDER — KETOROLAC TROMETHAMINE 30 MG/ML IJ SOLN
15.0000 mg | Freq: Once | INTRAMUSCULAR | Status: AC
  Administered 2020-04-05: 15 mg via INTRAVENOUS
  Filled 2020-04-05: qty 1

## 2020-04-05 MED ORDER — METOCLOPRAMIDE HCL 5 MG/ML IJ SOLN
10.0000 mg | Freq: Once | INTRAMUSCULAR | Status: AC
  Administered 2020-04-05: 10 mg via INTRAVENOUS
  Filled 2020-04-05: qty 2

## 2020-04-05 MED ORDER — LORAZEPAM 2 MG/ML IJ SOLN
INTRAMUSCULAR | Status: AC
  Administered 2020-04-05: 1 mg
  Filled 2020-04-05: qty 1

## 2020-04-05 MED ORDER — LORAZEPAM 2 MG/ML IJ SOLN: 1.0000 mg | Freq: Once | INTRAMUSCULAR | Status: DC

## 2020-04-05 MED ORDER — SODIUM CHLORIDE 0.9% FLUSH
3.0000 mL | Freq: Once | INTRAVENOUS | Status: DC
Start: 2020-04-05 — End: 2020-04-06

## 2020-04-05 MED ORDER — DIPHENHYDRAMINE HCL 50 MG/ML IJ SOLN
25.0000 mg | Freq: Once | INTRAMUSCULAR | Status: AC
  Administered 2020-04-05: 25 mg via INTRAVENOUS
  Filled 2020-04-05: qty 1

## 2020-04-05 MED ORDER — SODIUM CHLORIDE 0.9 % IV BOLUS
1000.0000 mL | Freq: Once | INTRAVENOUS | Status: AC
  Administered 2020-04-05: 1000 mL via INTRAVENOUS

## 2020-04-05 NOTE — ED Triage Notes (Signed)
Patient arrived via POV; c/o slurred speech. Patient reported she had a headache earlier today and took some tylenol. Patient then reported she developed numbness on left side of face and her "eyes feels cold".

## 2020-04-05 NOTE — ED Provider Notes (Signed)
[facility] EMERGENCY DEPARTMENT Provider Note   CSN: [id number] Arrival date & time: 04/05/20  1541  An emergency department physician performed an initial assessment on this suspected stroke patient at 59.  History Chief Complaint  Patient presents with  . Code Stroke    Debra Nelson is a 39 y.o. female.  39 yo F with a cc of R facial numbness and difficulty with speech.  This happened acutely.  The patient was made a code stroke on arrival.  Level 5 caveat acuity of condition.  Was able to obtain further history afterwards.  Continues to have difficulty with speech.  No headache.  Had some issue with change in vision of the right eye.  She does have a history of visual complaints that is thought to be secondary to her history of migraines.  States she is never had the tingling to the side of her face before.  The history is provided by the patient.  Illness Severity:  Moderate Onset quality:  Gradual Timing:  Constant Progression:  Worsening Chronicity:  New Associated symptoms: no chest pain, no congestion, no fever, no headaches, no myalgias, no nausea, no rhinorrhea, no shortness of breath, no vomiting and no wheezing        Past Medical History:  Diagnosis Date  . Asthma   . Bipolar disorder with depression ([place])    and PTSD  . Chicken pox   . Chronic migraine w/o aura, not intractable, w stat migr   . Deviated septum   . DJD (degenerative joint disease)    Severe of the right knee-Dr Creighton-ortho and Dr Gean Birchwood, PA-preferred pain managment  . GERD (gastroesophageal reflux disease)   . Narcolepsy   . PTSD (post-traumatic stress disorder)   . Seasonal allergic rhinitis     Patient Active Problem List   Diagnosis Date Noted  . Symptomatic PVCs 02/26/2020  . Atypical chest pain 10/06/2019  . Sinus tachycardia 08/27/2019  . Shortness of breath 08/27/2019  . Recurrent isolated sleep paralysis 07/02/2018  . Menstrual migraine  without status migrainosus, not intractable 01/02/2018  . Chronic thumb pain, bilateral 11/23/2017  . Elevated TSH 05/07/2017  . Trigger thumb, left thumb 03/15/2017  . Chronic post-traumatic stress disorder (PTSD) 02/26/2017  . Sensorineural hearing loss (SNHL) of both ears 12/12/2016  . Asthma 10/27/2016  . GERD (gastroesophageal reflux disease) 10/27/2016  . Seasonal allergic rhinitis 05/02/2016  . Right ear pain 05/02/2016  . Dysfunction of both eustachian tubes 05/02/2016  . Intractable migraine with aura with status migrainosus 10/19/2015  . Narcolepsy cataplexy syndrome 03/06/2014  . Bipolar II disorder ([place]) 12/31/2013  . Chondromalacia of knee 03/07/2013    Past Surgical History:  Procedure Laterality Date  . knee surgery x5     08,11, 12, 13 x2- R knee  . right foot surgery     arthritis/bone spurus. plate placed.   . WISDOM TOOTH EXTRACTION       OB History   No obstetric history on file.     Family History  Problem Relation Age of Onset  . Mental illness Mother        does not speak to regularly so does not know full history  . Other Father        medical issues, divorce related  . Other Brother        ptsd- fully discharged medically disabled before age 66  . Heart murmur Brother   . Cancer Paternal Grandfather     Social  History   Tobacco Use  . Smoking status: Never Smoker  . Smokeless tobacco: Never Used  Substance Use Topics  . Alcohol use: No  . Drug use: No    Home Medications Prior to Admission medications   Medication Sig Start Date End Date Taking? Authorizing Provider  albuterol (2.5 MG/3ML) 0.083% NEBU 3 mL, albuterol (5 MG/ML) 0.5% NEBU 0.5 mL Inhale into the lungs daily as needed.    [provider]  albuterol (PROAIR HFA) 108 (90 Base) MCG/ACT inhaler Inhale 2 puffs into the lungs every 6 (six) hours as needed for wheezing or shortness of breath.    [provider]  ALPRAZolam Duanne Moron) 0.5 MG tablet Take 0.5 mg by  mouth as needed for anxiety.     [provider]  Armodafinil 150 MG tablet Take 1 tablet (150 mg total) by mouth daily. 07/10/19   Dohmeier, Asencion Partridge, MD  ARNUITY ELLIPTA 200 MCG/ACT AEPB Inhale 1 puff into the lungs daily. 02/02/20   [provider]  busPIRone (BUSPAR) 30 MG tablet 15 mg 2 (two) times daily.  12/31/18   [provider]  cyclobenzaprine (FLEXERIL) 10 MG tablet Take 10 mg by mouth as needed. 02/20/20   [provider]  desloratadine (CLARINEX) 5 MG tablet Take 5 mg by mouth daily. 02/16/20   [provider]  EPIPEN 2-PAK 0.3 MG/0.3ML SOAJ injection as needed. 02/03/14   [provider]  fluticasone (FLONASE SENSIMIST) 27.5 MCG/SPRAY nasal spray Place 2 sprays into the nose daily.    [provider]  gabapentin (NEURONTIN) 300 MG capsule Take 300 mg by mouth 3 (three) times daily.    [provider]  IPRATROPIUM BROMIDE HFA IN Inhale into the lungs.    [provider]  lamoTRIgine (LAMICTAL) 150 MG tablet Take 150 mg by mouth 2 (two) times daily.     [provider]  levalbuterol Penne Lash HFA) 45 MCG/ACT inhaler Inhale 1-2 puffs into the lungs every 6 (six) hours as needed for wheezing (if having issues with high heart rate on albuterol). 01/01/20   Marin Olp, MD  loratadine (CLARITIN) 10 MG tablet Take 10 mg by mouth daily.    [provider]  magnesium oxide (MAG-OX) 400 MG tablet Take 400 mg by mouth every evening.    [provider]  metoprolol tartrate (LOPRESSOR) 25 MG tablet Take 1 tablet (25 mg total) by mouth 2 (two) times daily. 02/26/20 05/26/20  Patwardhan, Reynold Bowen, MD  Multiple Vitamins-Minerals (HAIR SKIN AND NAILS FORMULA PO) Take 1 tablet by mouth daily.     [provider]  Olopatadine HCl 0.6 % SOLN Place 1 spray into both nostrils in the morning and at bedtime.  11/20/19   [provider]  ondansetron (ZOFRAN-ODT) 4 MG disintegrating tablet  DISSOLVE ONE TABLET BY MOUTH EVERY 8 HOURS AS NEEDED FOR NAUSEA/ VOMITING 04/05/20   Marin Olp, MD  pantoprazole (PROTONIX) 40 MG tablet Take 1 tablet (40 mg total) by mouth daily. 03/29/20   Marin Olp, MD  promethazine (PHENERGAN) 25 MG tablet TAKE ONE TABLET BY MOUTH EVERY 6 HOURS AS NEEDED FOR NAUSEA AND VOMITING 02/16/20   Dohmeier, Asencion Partridge, MD  pyridoxine (B-6) 100 MG tablet Take 100 mg by mouth in the morning and at bedtime.     [provider]  Sodium Oxybate 500 MG/ML SOLN Take 3.5gms at bedtime. Take 4.5gms 2 hours later. Total of 8gms per night. Dispense 367ml. 12/02/19   Dohmeier, Asencion Partridge, MD  SUMAtriptan (IMITREX) 100 MG tablet TAKE 1/2 TO 1  TABLET BY MOUTH AT ONSET OF HEADACHE; MAY REPEAT ONE TABLET IN 2 HOURS IF NEEDED. Patient taking differently: Take 50-100 mg by mouth every 2 (two) hours as needed for headache.  09/22/19   Dohmeier, Asencion Partridge, MD  Thiamine HCl (VITAMIN B-1) 250 MG tablet Take 250 mg by mouth daily.    [provider]  vitamin B-12 (CYANOCOBALAMIN) 250 MCG tablet Take 250 mcg by mouth in the morning and at bedtime.     [provider]    Allergies    Other, Klonopin [clonazepam], Phenergan [promethazine hcl], Promethazine, Promethazine hcl, and Topamax [topiramate]  Review of Systems   Review of Systems  Constitutional: Negative for chills and fever.  HENT: Negative for congestion and rhinorrhea.   Eyes: Positive for visual disturbance. Negative for redness.  Respiratory: Negative for shortness of breath and wheezing.   Cardiovascular: Negative for chest pain and palpitations.  Gastrointestinal: Negative for nausea and vomiting.  Genitourinary: Negative for dysuria and urgency.  Musculoskeletal: Negative for arthralgias and myalgias.  Skin: Negative for pallor and wound.  Neurological: Positive for numbness (tingling to right side of face). Negative for dizziness and headaches.    Physical Exam Updated Vital Signs BP  95/67   Pulse 76   Temp 97.9 F (36.6 C) (Oral)   Resp 14   Ht 5\' 1"  (1.549 m)   Wt 69.4 kg   SpO2 100%   BMI 28.91 kg/m   Physical Exam Vitals and nursing note reviewed.  Constitutional:      General: She is not in acute distress.    Appearance: She is well-developed. She is not diaphoretic.  HENT:     Head: Normocephalic and atraumatic.  Eyes:     Pupils: Pupils are equal, round, and reactive to light.  Cardiovascular:     Rate and Rhythm: Normal rate and regular rhythm.     Heart sounds: No murmur. No friction rub. No gallop.   Pulmonary:     Effort: Pulmonary effort is normal.     Breath sounds: No wheezing or rales.  Abdominal:     General: There is no distension.     Palpations: Abdomen is soft.     Tenderness: There is no abdominal tenderness.  Musculoskeletal:        General: No tenderness.     Cervical back: Normal range of motion and neck supple.  Skin:    General: Skin is warm and dry.  Neurological:     Mental Status: She is alert and oriented to person, place, and time.     Cranial Nerves: Cranial nerves are intact.     Sensory: Sensation is intact.     Motor: Motor function is intact.     Coordination: Coordination is intact.  Psychiatric:        Behavior: Behavior normal.     ED Results / Procedures / Treatments   Labs (all labs ordered are listed, but only abnormal results are displayed) Labs Reviewed  APTT - Abnormal; Notable for the following components:      Result Value   aPTT 23 (*)    All other components within normal limits  CBC - Abnormal; Notable for the following components:   Hemoglobin 15.5 (*)    HCT 46.2 (*)    Platelets 418 (*)    All other components within normal limits  COMPREHENSIVE METABOLIC PANEL - Abnormal; Notable for the following components:   Glucose, Bld  102 (*)    Total Bilirubin 1.3 (*)    All other components within normal limits  I-STAT CHEM 8, ED - Abnormal; Notable for the following components:    Glucose, Bld 103 (*)    Calcium, Ion 1.05 (*)    Hemoglobin 15.6 (*)    All other components within normal limits  SARS CORONAVIRUS 2 BY RT PCR (HOSPITAL ORDER, [facility])  PROTIME-INR  DIFFERENTIAL  CBG MONITORING, ED  I-STAT BETA HCG BLOOD, ED ([facility], [facility], [facility] ONLY)    EKG EKG Interpretation  Date/Time:  Monday Apr 05 2020 15:46:26 EDT Ventricular Rate:  89 PR Interval:  122 QRS Duration: 60 QT Interval:  334 QTC Calculation: 406 R Axis:   63 Text Interpretation: Normal sinus rhythm Low voltage QRS Cannot rule out Anterior infarct , age undetermined Abnormal ECG No significant change since last tracing Confirmed by Deno Etienne [phone]) on 04/05/2020 4:05:45 PM   Radiology MR BRAIN WO CONTRAST  Result Date: 04/05/2020 CLINICAL DATA:  Stroke. Slurred speech and left-sided weakness. EXAM: MRI HEAD WITHOUT CONTRAST TECHNIQUE: Multiplanar, multiecho pulse sequences of the brain and surrounding structures were obtained without intravenous contrast. COMPARISON:  Head CT 04/05/2020 and MRI 02/01/2020 FINDINGS: Brain: There is no evidence of acute infarct, intracranial hemorrhage, mass, midline shift, or extra-axial fluid collection. The ventricles and sulci are normal. The brain is normal in signal. Vascular: Major intracranial vascular flow voids are preserved. Skull and upper cervical spine: Unremarkable bone marrow signal para Sinuses/Orbits: Unremarkable orbits. Paranasal sinuses and mastoid air cells are clear. Other: None. IMPRESSION: Negative brain MRI. Electronically Signed   By: Logan Bores M.D.   On: 04/05/2020 16:55   CT HEAD CODE STROKE WO CONTRAST  Result Date: 04/05/2020 CLINICAL DATA:  Code stroke. Neuro deficit, acute, stroke suspected. Slurred speech, left-sided weakness. EXAM: CT HEAD WITHOUT CONTRAST TECHNIQUE: Contiguous axial images were obtained from the base of the skull through the vertex without intravenous contrast. COMPARISON:  Brain MRI  02/01/2020. FINDINGS: Brain: There is no acute intracranial hemorrhage. No demarcated cortical infarct. No extra-axial fluid collection. No evidence of intracranial mass. No midline shift. Vascular: No hyperdense vessel. Skull: Normal. Negative for fracture or focal lesion. Sinuses/Orbits: Visualized orbits show no acute finding. No significant paranasal sinus disease or mastoid effusion at the imaged levels. ASPECTS ([place]well Stroke Program Early CT Score) - Ganglionic level infarction (caudate, lentiform nuclei, internal capsule, insula, M1-M3 cortex): 7 - Supraganglionic infarction (M4-M6 cortex): 3 Total score (0-10 with 10 being normal): 10 These results were communicated to at 4:18 pmon 5/17/2021by text page via the [facility] messaging system. IMPRESSION: Unremarkable non-contrast CT appearance of the brain. No evidence of acute intracranial abnormality. ASPECTS is 10. Electronically Signed   By: Kellie Simmering DO   On: 04/05/2020 16:18    Procedures Procedures (including critical care time)  Medications Ordered in ED Medications  sodium chloride flush (NS) 0.9 % injection 3 mL (has no administration in time range)  LORazepam (ATIVAN) injection 1 mg (1 mg Intravenous Not Given 04/05/20 1803)  LORazepam (ATIVAN) 2 MG/ML injection (1 mg  Given 04/05/20 1630)  ketorolac (TORADOL) 30 MG/ML injection 15 mg (15 mg Intravenous Given 04/05/20 1818)  metoCLOPramide (REGLAN) injection 10 mg (10 mg Intravenous Given 04/05/20 1817)  diphenhydrAMINE (BENADRYL) injection 25 mg (25 mg Intravenous Given 04/05/20 1820)  sodium chloride 0.9 % bolus 1,000 mL (0 mLs Intravenous Stopped 04/05/20 1918)    ED Course  I have reviewed the  triage vital signs and the nursing notes.  Pertinent labs & imaging results that were available during my care of the patient were reviewed by me and considered in my medical decision making (see chart for details).    MDM Rules/Calculators/A&P                      39 yo F with a chief  complaints of right-sided facial tingling and right eye blurry vision.  Medical stroke due to acute onset of symptoms.  Seen by neurology with a negative CT of the head and emergent MRI.  Thought to likely be a complicated migraine.  Recommending treatment with a headache cocktail and then likely follow-up as an outpatient with her neurologist.  Symptoms have resolved.  Discharge home.    I have discussed the diagnosis/risks/treatment options with the patient and believe the pt to be eligible for discharge home to follow-up with Neuro. We also discussed returning to the ED immediately if new or worsening sx occur. We discussed the sx which are most concerning (e.g., stroke s/sx) that necessitate immediate return. Medications administered to the patient during their visit and any new prescriptions provided to the patient are listed below.  Medications given during this visit Medications  sodium chloride flush (NS) 0.9 % injection 3 mL (has no administration in time range)  LORazepam (ATIVAN) injection 1 mg (1 mg Intravenous Not Given 04/05/20 1803)  LORazepam (ATIVAN) 2 MG/ML injection (1 mg  Given 04/05/20 1630)  ketorolac (TORADOL) 30 MG/ML injection 15 mg (15 mg Intravenous Given 04/05/20 1818)  metoCLOPramide (REGLAN) injection 10 mg (10 mg Intravenous Given 04/05/20 1817)  diphenhydrAMINE (BENADRYL) injection 25 mg (25 mg Intravenous Given 04/05/20 1820)  sodium chloride 0.9 % bolus 1,000 mL (0 mLs Intravenous Stopped 04/05/20 1918)     The patient appears reasonably screen and/or stabilized for discharge and I doubt any other medical condition or other [facility] requiring further screening, evaluation, or treatment in the ED at this time prior to discharge.   Final Clinical Impression(s) / ED Diagnoses Final diagnoses:  Slurred speech  Facial tingling    Rx / DC Orders ED Discharge Orders    None       Deno Etienne, DO 04/06/20 0000

## 2020-04-05 NOTE — ED Notes (Signed)
To CT scan

## 2020-04-05 NOTE — ED Notes (Signed)
Patient verbalizes understanding of discharge instructions. Opportunity for questioning and answers were provided. Armband removed by staff, pt discharged from ED ambulatory.   

## 2020-04-05 NOTE — Code Documentation (Addendum)
Stroke Response Nurse Documentation Code Documentation  Debra Nelson is a 39 y.o. female arriving to [facility] ED via Private Vehicle on 5/17 with past medical hx of migraines. Code stroke was activated by ED. Patient from home where she was LKW at 1330 and now complaining of facial numbness and slurred speech.  Stroke team at the bedside on after Code Stroke activation in CT. Labs drawn. Patient to CT with ED RN. NIHSS 0, see documentation for details and code stroke times. Pt has slight dysarthria on exam. The following imaging was completed: CT. Patient taken to MRI from CT to r/o stroke. Patient is not a candidate for tPA due to stroke not noted on MRI. Bedside handoff with ED RN Caryl Pina. Code Stroke cancelled. q2 hours per MD Rory Percy.   Debra Nelson  Stroke Response RN

## 2020-04-05 NOTE — Consult Note (Addendum)
Neurology Consultation  Reason for Consult: Code stroke Referring Physician: Deno Etienne, DO  CC: Right facial tingling and some difficulty with speech  History is obtained from: Patient  HPI: Debra Nelson is a 39 y.o. female who has past medical history of narcolepsy, bipolar disease, PTSD, migraine headaches.  At 1600 hrs. today patient noted that she had some tingling and heaviness on the right aspect of her face and some slurred speech.  Patient drove herself to most [place].  While in triage due to the symptoms code stroke was initiated.  Initial exam while prior to CT scanner showed some hesitancy in speech and stuttering.  She did state she felt anxious.  Patient was then brought to MRI, after MRI she stated that she felt heaviness tingling sensation on the left side of her face.  Patient states that back in March she did have a headache in the morning and then noted a blurred vision on the right side.  After given a picture of what scotoma looks like she felt as though it did slightly look like that but it was after her headache.  Patient denies any significant lack of sleep, drinking red wine, taking any stimulants and or caffeinated drinks and being under any significant stresses.  ED course  CT head shows-unremarkable noncontrast CT scan of brain.  MRI head-negative MRI brain  Chart review-patient does see Dr. Brett Fairy and nurse practitioner at [facility] for narcolepsy and headaches.  LKW: 1600 hrs. tpa given?: no, negative MRI Premorbid modified Rankin scale (mRS): 0 NIH stroke score: 1 for hesitancy in speech and stuttering   Past Medical History:  Diagnosis Date  . Asthma   . Bipolar disorder with depression ([place])    and PTSD  . Chicken pox   . Chronic migraine w/o aura, not intractable, w stat migr   . Deviated septum   . DJD (degenerative joint disease)    Severe of the right knee-Dr Creighton-ortho and Dr Gean Birchwood, PA-preferred pain  managment  . GERD (gastroesophageal reflux disease)   . Narcolepsy   . PTSD (post-traumatic stress disorder)   . Seasonal allergic rhinitis    Family History  Problem Relation Age of Onset  . Mental illness Mother        does not speak to regularly so does not know full history  . Other Father        medical issues, divorce related  . Other Brother        ptsd- fully discharged medically disabled before age 39  . Heart murmur Brother   . Cancer Paternal Grandfather    Social History:   reports that she has never smoked. She has never used smokeless tobacco. She reports that she does not drink alcohol or use drugs.  Medications  Current Facility-Administered Medications:  .  diphenhydrAMINE (BENADRYL) injection 25 mg, 25 mg, Intravenous, Once, Tyrone Nine, Dan, DO .  ketorolac (TORADOL) 30 MG/ML injection 15 mg, 15 mg, Intravenous, Once, Deno Etienne, DO .  LORazepam (ATIVAN) 2 MG/ML injection, , , ,  .  LORazepam (ATIVAN) injection 1 mg, 1 mg, Intravenous, Once, Amie Portland, MD .  metoCLOPramide (REGLAN) injection 10 mg, 10 mg, Intravenous, Once, Tyrone Nine, Dan, DO .  sodium chloride flush (NS) 0.9 % injection 3 mL, 3 mL, Intravenous, Once, Deno Etienne, DO  Current Outpatient Medications:  .  albuterol (2.5 MG/3ML) 0.083% NEBU 3 mL, albuterol (5 MG/ML) 0.5% NEBU 0.5 mL, Inhale into the lungs daily as needed., Disp: ,  Rfl:  .  albuterol (PROAIR HFA) 108 (90 Base) MCG/ACT inhaler, Inhale 2 puffs into the lungs every 6 (six) hours as needed for wheezing or shortness of breath., Disp: , Rfl:  .  ALPRAZolam (XANAX) 0.5 MG tablet, Take 0.5 mg by mouth as needed for anxiety. , Disp: , Rfl:  .  Armodafinil 150 MG tablet, Take 1 tablet (150 mg total) by mouth daily., Disp: 30 tablet, Rfl: 4 .  ARNUITY ELLIPTA 200 MCG/ACT AEPB, Inhale 1 puff into the lungs daily., Disp: , Rfl:  .  busPIRone (BUSPAR) 30 MG tablet, 15 mg 2 (two) times daily. , Disp: , Rfl:  .  cyclobenzaprine (FLEXERIL) 10 MG tablet,  Take 10 mg by mouth as needed., Disp: , Rfl:  .  desloratadine (CLARINEX) 5 MG tablet, Take 5 mg by mouth daily., Disp: , Rfl:  .  EPIPEN 2-PAK 0.3 MG/0.3ML SOAJ injection, as needed., Disp: , Rfl:  .  fluticasone (FLONASE SENSIMIST) 27.5 MCG/SPRAY nasal spray, Place 2 sprays into the nose daily., Disp: , Rfl:  .  gabapentin (NEURONTIN) 300 MG capsule, Take 300 mg by mouth 3 (three) times daily., Disp: , Rfl:  .  IPRATROPIUM BROMIDE HFA IN, Inhale into the lungs., Disp: , Rfl:  .  lamoTRIgine (LAMICTAL) 150 MG tablet, Take 150 mg by mouth 2 (two) times daily. , Disp: , Rfl:  .  levalbuterol (XOPENEX HFA) 45 MCG/ACT inhaler, Inhale 1-2 puffs into the lungs every 6 (six) hours as needed for wheezing (if having issues with high heart rate on albuterol)., Disp: 1 Inhaler, Rfl: 5 .  loratadine (CLARITIN) 10 MG tablet, Take 10 mg by mouth daily., Disp: , Rfl:  .  magnesium oxide (MAG-OX) 400 MG tablet, Take 400 mg by mouth every evening., Disp: , Rfl:  .  metoprolol tartrate (LOPRESSOR) 25 MG tablet, Take 1 tablet (25 mg total) by mouth 2 (two) times daily., Disp: 90 tablet, Rfl: 2 .  Multiple Vitamins-Minerals (HAIR SKIN AND NAILS FORMULA PO), Take 1 tablet by mouth daily. , Disp: , Rfl:  .  Olopatadine HCl 0.6 % SOLN, Place 1 spray into both nostrils in the morning and at bedtime. , Disp: , Rfl:  .  ondansetron (ZOFRAN-ODT) 4 MG disintegrating tablet, DISSOLVE ONE TABLET BY MOUTH EVERY 8 HOURS AS NEEDED FOR NAUSEA/ VOMITING, Disp: 18 tablet, Rfl: 0 .  pantoprazole (PROTONIX) 40 MG tablet, Take 1 tablet (40 mg total) by mouth daily., Disp: 30 tablet, Rfl: 1 .  promethazine (PHENERGAN) 25 MG tablet, TAKE ONE TABLET BY MOUTH EVERY 6 HOURS AS NEEDED FOR NAUSEA AND VOMITING, Disp: 90 tablet, Rfl: 0 .  pyridoxine (B-6) 100 MG tablet, Take 100 mg by mouth in the morning and at bedtime. , Disp: , Rfl:  .  Sodium Oxybate 500 MG/ML SOLN, Take 3.5gms at bedtime. Take 4.5gms 2 hours later. Total of 8gms per night.  Dispense 358ml., Disp: 360 mL, Rfl: 5 .  SUMAtriptan (IMITREX) 100 MG tablet, TAKE 1/2 TO 1  TABLET BY MOUTH AT ONSET OF HEADACHE; MAY REPEAT ONE TABLET IN 2 HOURS IF NEEDED. (Patient taking differently: Take 50-100 mg by mouth every 2 (two) hours as needed for headache. ), Disp: 10 tablet, Rfl: 5 .  Thiamine HCl (VITAMIN B-1) 250 MG tablet, Take 250 mg by mouth daily., Disp: , Rfl:  .  vitamin B-12 (CYANOCOBALAMIN) 250 MCG tablet, Take 250 mcg by mouth in the morning and at bedtime. , Disp: , Rfl:   ROS:  General ROS: Positive for -headaches Psychological ROS: Positive for -anxiety ophthalmic ROS: negative for - blurry vision, double vision, eye pain or loss of vision ENT ROS: negative for - epistaxis, nasal discharge, oral lesions, sore throat, tinnitus or vertigo Allergy and Immunology ROS: negative for - hives or itchy/watery eyes Hematological and Lymphatic ROS: negative for - bleeding problems, bruising or swollen lymph nodes Endocrine ROS: negative for - galactorrhea, hair pattern changes, polydipsia/polyuria or temperature intolerance Respiratory ROS: negative for - cough, hemoptysis, shortness of breath or wheezing Cardiovascular ROS: Positive for -"PVCs" t Gastrointestinal ROS: negative for - abdominal pain, diarrhea, hematemesis, nausea/vomiting or stool incontinence Genito-Urinary ROS: negative for - dysuria, hematuria, incontinence or urinary frequency/urgency Musculoskeletal ROS: negative for - joint swelling or muscular weakness Neurological ROS: as noted in HPI Dermatological ROS: negative for rash and skin lesion changes  Exam: Current vital signs: BP (!) 130/98 (BP Location: Right Arm)   Pulse (!) 102   Temp 97.9 F (36.6 C) (Oral)   Resp 14   Ht 5\' 1"  (1.549 m)   Wt 69.4 kg   SpO2 100%   BMI 28.91 kg/m  Vital signs in last 24 hours: Temp:  [97.9 F (36.6 C)] 97.9 F (36.6 C) (05/17 1547) Pulse Rate:  [102] 102 (05/17 1547) Resp:  [14] 14 (05/17 1547) BP:  (130)/(98) 130/98 (05/17 1547) SpO2:  [100 %] 100 % (05/17 1547) Weight:  [69.4 kg] 69.4 kg (05/17 1548)   Constitutional: Appears well-developed and well-nourished.  Psych: Anxious Eyes: No scleral injection HENT: No OP obstrucion Head: Normocephalic.  Cardiovascular: Normal rate and regular rhythm.  Respiratory: Effort normal, non-labored breathing GI: Soft.  No distension. There is no tenderness.  Skin: WDI  Neuro: Mental Status: Patient is awake, alert, oriented to person, place, month, year, and situation.  Patient one point in exam had hesitancy in speech and some stuttering however there was no problems with naming, repeating, comprehension.  Patient was able to give a good history.  Patient was able to follow all commands. Cranial Nerves: II: Visual Fields are full.  III,IV, VI: EOMI without ptosis or diploplia. Pupils equal, round and reactive to light V: Facial sensation is symmetric to temperature VII: Facial movement is symmetric.  VIII: hearing is intact to voice X: Palat elevates symmetrically XI: Shoulder shrug is symmetric. XII: tongue is midline without atrophy or fasciculations.  Motor: Tone is normal. Bulk is normal. 5/5 strength was present in all four extremities.  Drift aterixis Sensory: Sensation is symmetric to light touch and temperature in the arms and legs. DSS Deep Tendon Reflexes: 2+ and symmetric in the biceps and patellae.  Plantars: Toes are downgoing bilaterally.  Cerebellar: FNF and HKS are intact bilaterally  Labs I have reviewed labs in epic and the results pertinent to this consultation are:   CBC    Component Value Date/Time   WBC 10.2 04/05/2020 1604   RBC 4.92 04/05/2020 1604   HGB 15.6 (H) 04/05/2020 1612   HGB 14.7 07/02/2018 1050   HCT 46.0 04/05/2020 1612   HCT 44.7 07/02/2018 1050   PLT 418 (H) 04/05/2020 1604   PLT 369 07/02/2018 1050   MCV 93.9 04/05/2020 1604   MCV 97 07/02/2018 1050   MCH 31.5 04/05/2020 1604    MCHC 33.5 04/05/2020 1604   RDW 12.0 04/05/2020 1604   RDW 12.6 07/02/2018 1050   LYMPHSABS 2.5 04/05/2020 1604   LYMPHSABS 2.1 07/02/2018 1050   MONOABS 0.6 04/05/2020 1604   EOSABS 0.0  04/05/2020 1604   EOSABS 0.1 07/02/2018 1050   BASOSABS 0.1 04/05/2020 1604   BASOSABS 0.0 07/02/2018 1050    CMP     Component Value Date/Time   NA 137 04/05/2020 1612   NA 142 08/23/2019 0000   K 3.7 04/05/2020 1612   CL 105 04/05/2020 1612   CO2 23 01/31/2020 1449   GLUCOSE 103 (H) 04/05/2020 1612   BUN 11 04/05/2020 1612   BUN 10 08/23/2019 0000   CREATININE 0.90 04/05/2020 1612   CALCIUM 9.5 01/31/2020 1449   PROT 6.9 01/01/2020 1053   PROT 7.2 07/10/2019 1008   ALBUMIN 4.7 01/01/2020 1053   ALBUMIN 5.2 (H) 07/10/2019 1008   AST 13 01/01/2020 1053   ALT 12 01/01/2020 1053   ALKPHOS 78 01/01/2020 1053   BILITOT 0.9 01/01/2020 1053   BILITOT 0.7 07/10/2019 1008   GFRNONAA >60 01/31/2020 1449   GFRAA >60 01/31/2020 1449    Lipid Panel     Component Value Date/Time   CHOL 170 01/30/2018 0915   TRIG 91 01/30/2018 0915   HDL 57 01/30/2018 0915   LDLCALC 95 01/30/2018 0915     Imaging I have reviewed the images obtained:  CT-scan of the brain-negative CT scan of brain  MRI examination of the brain-negative MRI of brain  Etta Quill PA-C [facility] [phone]  M-F  (9:00 am- 5:00 PM)  04/05/2020, 4:55 PM     Assessment:  Is a 39 year old female who has history of migraine headaches, PTSD and bipolar.  Patient states that today at 1600 hrs. she noted she had some tingling in the right face and also some stuttering/hesitancy in speech.  She drove herself to the ED.  Initially she did have some stuttering of speech however post MRI she appeared anxious but had no symptoms.  As noted above CT head and MRI of head were negative.  Most recent exam showed no neurological deficits.  At this time I believe this is most likely a complicated  migraine.  Impression: -Tingling of face -Speech difficulties which resolved  Recommendations: -Migraine cocktail of Reglan Benadryl and Toradol -If patient is feeling better patient may be discharged -Patient should follow-up with neurologist for consideration of migraine prophylaxis  This has been conveyed to Dr. Tyrone Nine.  He will call us if he has any other questions.  Attending addendum Patient seen and examined as an acute code stroke for right facial tingling and speech difficulty. 39 year old past history of narcolepsy, cataplexy, bipolar disease, PTSD, migraine headaches, patient of [place] [facility]-Dr. Dohmeier, presenting for evaluation of tingling that started on the left cheek and then progressed to involve the right cheek. Had a headache earlier this morning.  Has had some symptoms associated with a headache-example episode of blurred vision on the right eye-monocular vision loss with negative imaging findings in March 2021. She says that she does get headaches but does not really get an aura and has not had complex migraines in the past. On examination: Awake alert oriented x3.Speech is mildly stuttering and not really dysarthric.No evidence of aphasia No cranial or motor or sensory deficits noted on exam.  No coordination deficits. Scored NIH-1 for dysarthria. Due to the new nature of the symptoms and no prior history of a similar episode, stat MRI was done to rule out any acute process such as a stroke. Imaging personally reviewed by me. No evidence of acute stroke on MRI of the brain.  Assessment recommendations Likely anxiety versus complex migraine  -Migraine cocktail -  IV fluids -Discharge home with follow-up with Dr. Brett Fairy in the next 2 to 4 weeks.  Patient to discuss prophylactic headache management with her outpatient neurologist.  This was discussed with the patient in detail at her bedside. All her questions were answered.  Plan was also discussed with  Dr. Tyrone Nine, ED provider, in the emergency room.  Code stroke was canceled because of this being a stroke mimic with negative imaging.  Inpatient neurology will be available as needed.  Please call with questions.  -- Amie Portland, MD [facility] Pager: [phone] If 7pm to 7am, please call on call as listed on [facility]ION.

## 2020-04-05 NOTE — Discharge Instructions (Signed)
Please call your neurologist tomorrow and discuss your visit here.  See when they want to see you in the office. They may want to try medications to prevent you from having headaches.

## 2020-04-06 ENCOUNTER — Encounter: Payer: Self-pay | Admitting: Family Medicine

## 2020-04-07 ENCOUNTER — Ambulatory Visit: Payer: BC Managed Care – PPO | Admitting: Cardiology

## 2020-04-07 ENCOUNTER — Other Ambulatory Visit: Payer: Self-pay | Admitting: Neurology

## 2020-04-07 DIAGNOSIS — G43111 Migraine with aura, intractable, with status migrainosus: Secondary | ICD-10-CM

## 2020-04-07 DIAGNOSIS — G4489 Other headache syndrome: Secondary | ICD-10-CM

## 2020-04-07 NOTE — Progress Notes (Signed)
Rescheduled

## 2020-04-07 NOTE — Progress Notes (Signed)
Phone [phone] Virtual visit via Video note   Subjective:  Chief complaint: Chief Complaint  Patient presents with  . virtual  . discuss labs  . sinus issues   This visit type was conducted due to national recommendations for restrictions regarding the COVID-19 Pandemic (e.g. social distancing).  This format is felt to be most appropriate for this patient at this time balancing risks to patient and risks to population by having him in for in person visit.  No physical exam was performed (except for noted visual exam or audio findings with Telehealth visits).    Our team/I connected with [name] at  3:40 PM EDT by a video enabled telemedicine application (doxy.me or caregility through epic) and verified that I am speaking with the correct person using two identifiers.  Location patient: [place] Location provider: [facility], office Persons participating in the virtual visit:  patient  Our team/I discussed the limitations of evaluation and management by telemedicine and the availability of in person appointments. In light of current covid-19 pandemic, patient also understands that we are trying to protect them by minimizing in office contact if at all possible.  The patient expressed consent for telemedicine visit and agreed to proceed. Patient understands insurance will be billed.   Past Medical History-  Patient Active Problem List   Diagnosis Date Noted  . Chronic post-traumatic stress disorder (PTSD) 02/26/2017    Priority: High  . Narcolepsy cataplexy syndrome 03/06/2014    Priority: High  . Bipolar II disorder ([place]) 12/31/2013    Priority: High  . Symptomatic PVCs 02/26/2020    Priority: Medium  . Asthma 10/27/2016    Priority: Medium  . GERD (gastroesophageal reflux disease) 10/27/2016    Priority: Medium  . Intractable migraine with aura with status migrainosus 10/19/2015    Priority: Medium  . Recurrent isolated sleep paralysis 07/02/2018    Priority: Low    . Menstrual migraine without status migrainosus, not intractable 01/02/2018    Priority: Low  . Chronic thumb pain, bilateral 11/23/2017    Priority: Low  . Elevated TSH 05/07/2017    Priority: Low  . Trigger thumb, left thumb 03/15/2017    Priority: Low  . Sensorineural hearing loss (SNHL) of both ears 12/12/2016    Priority: Low  . Seasonal allergic rhinitis 05/02/2016    Priority: Low  . Right ear pain 05/02/2016    Priority: Low  . Dysfunction of both eustachian tubes 05/02/2016    Priority: Low  . Chondromalacia of knee 03/07/2013    Priority: Low  . Erroneous encounter - disregard 04/08/2020  . Atypical chest pain 10/06/2019  . Sinus tachycardia 08/27/2019  . Shortness of breath 08/27/2019    Medications- reviewed and updated Current Outpatient Medications  Medication Sig Dispense Refill  . albuterol (2.5 MG/3ML) 0.083% NEBU 3 mL, albuterol (5 MG/ML) 0.5% NEBU 0.5 mL Inhale into the lungs daily as needed.    Marland Kitchen albuterol (PROAIR HFA) 108 (90 Base) MCG/ACT inhaler Inhale 2 puffs into the lungs every 6 (six) hours as needed for wheezing or shortness of breath.    . ALPRAZolam (XANAX) 0.5 MG tablet Take 0.5 mg by mouth as needed for anxiety.     . Armodafinil 150 MG tablet Take 1 tablet (150 mg total) by mouth daily. 30 tablet 4  . ARNUITY ELLIPTA 200 MCG/ACT AEPB Inhale 1 puff into the lungs daily.    . cyclobenzaprine (FLEXERIL) 10 MG tablet Take 10 mg by mouth as needed.    Marland Kitchen  desloratadine (CLARINEX) 5 MG tablet Take 5 mg by mouth daily.    Marland Kitchen diltiazem (CARDIZEM CD) 120 MG 24 hr capsule Take 1 capsule (120 mg total) by mouth daily. 30 capsule 3  . doxycycline (VIBRA-TABS) 100 MG tablet Take 1 tablet (100 mg total) by mouth 2 (two) times daily. 20 tablet 0  . EPIPEN 2-PAK 0.3 MG/0.3ML SOAJ injection as needed.    . fluticasone (FLONASE SENSIMIST) 27.5 MCG/SPRAY nasal spray Place 2 sprays into the nose daily.    Marland Kitchen gabapentin (NEURONTIN) 300 MG capsule Take 300 mg by mouth 3  (three) times daily.    . IPRATROPIUM BROMIDE HFA IN Inhale into the lungs.    . lamoTRIgine (LAMICTAL) 150 MG tablet Take 150 mg by mouth 2 (two) times daily.     Marland Kitchen levalbuterol (XOPENEX HFA) 45 MCG/ACT inhaler Inhale 1-2 puffs into the lungs every 6 (six) hours as needed for wheezing (if having issues with high heart rate on albuterol). 1 Inhaler 5  . loratadine (CLARITIN) 10 MG tablet Take 10 mg by mouth daily.    . magnesium oxide (MAG-OX) 400 MG tablet Take 400 mg by mouth every evening.    . Multiple Vitamins-Minerals (HAIR SKIN AND NAILS FORMULA PO) Take 1 tablet by mouth daily.     . Olopatadine HCl 0.6 % SOLN Place 1 spray into both nostrils in the morning and at bedtime.     . ondansetron (ZOFRAN-ODT) 4 MG disintegrating tablet DISSOLVE ONE TABLET BY MOUTH EVERY 8 HOURS AS NEEDED FOR NAUSEA/ VOMITING 18 tablet 0  . pantoprazole (PROTONIX) 40 MG tablet Take 1 tablet (40 mg total) by mouth daily. 30 tablet 1  . promethazine (PHENERGAN) 25 MG tablet TAKE ONE TABLET BY MOUTH EVERY 6 HOURS AS NEEDED FOR NAUSEA AND VOMITING 90 tablet 0  . pyridoxine (B-6) 100 MG tablet Take 100 mg by mouth in the morning and at bedtime.     . Sodium Oxybate 500 MG/ML SOLN Take 3.5gms at bedtime. Take 4.5gms 2 hours later. Total of 8gms per night. Dispense 347ml. 360 mL 5  . SUMAtriptan (IMITREX) 100 MG tablet TAKE 1/2 TO 1  TABLET BY MOUTH AT ONSET OF HEADACHE; MAY REPEAT ONE TABLET IN 2 HOURS IF NEEDED. (Patient taking differently: Take 50-100 mg by mouth every 2 (two) hours as needed for headache. ) 10 tablet 5  . SYMBICORT 160-4.5 MCG/ACT inhaler     . Thiamine HCl (VITAMIN B-1) 250 MG tablet Take 250 mg by mouth daily.    . vitamin B-12 (CYANOCOBALAMIN) 250 MCG tablet Take 250 mcg by mouth in the morning and at bedtime.      No current facility-administered medications for this visit.     Objective:  BP 115/73   Pulse 80   Temp 98 F (36.7 C)   Ht 5\' 1"  (1.549 m)   Wt 153 lb (69.4 kg)   SpO2 98%    BMI 28.91 kg/m  self reported vitals Gen: NAD, resting comfortably Lungs: nonlabored, normal respiratory rate  Skin: appears dry, no obvious rash     Assessment and Plan  #GERD S: Medication: Protonix 40 mg daily, Pepcid over-the-counter. Triggers: Patient with worsening symptoms during allergy season.   A/P: We have discussed GI referral if symptoms do not resolve when allergies better controlled in the past-patient reports ongoing issues with reflux despite taking both Protonix and Pepcid-has had to take Tums on occasion that does seem to help.  We discussed with higher level of  medication need for her age we thought should proceed forward with GI referral-she agrees and this was placed today.  %# Migraines-managed by neurology Dr. Brett Fairy S: Medication: Takes vitamin B6.  As needed treatment: Phenergan for nausea PO only, sumatriptan  Normal MRI brain 01/31/20 ED vsiit for near syncope, tunnel vision- may hae been anxiety or complex migraine.   Another reassuring MRI Apr 05, 2020 as well as CT head for code stroke now presenting with slurred speech and left-sided weakness.  Neurology thought this was a complicated migraine A/P: We reviewed recent ED hospitalization as well as lab work.  I am thankful she has not had recurrence.  She is aware this was a complicated migraine.  Should follow up with neurology.  We reviewed blood work from the emergency room-nothing needing urgent follow-up.  Mildly elevated bilirubin-we can recheck next visit. -Follow-up with neurologist in August plan  % #Asthma and allergies- managed by allergist primarily S: Maintenance Medication for asthma: arnuity  Maintenance medications for allergies: Claritin a.m., Clarinex and p.m., olopatadine and Flonase nasally. As needed medication: Xopenex for asthma.  Patient feels like her allergy symptoms are contributing to ongoing nasal congestion.  She also has off-and-on shortness of breath.  She has a hard time  taking good deep breath.  Can get very anxious with this-check pulse oxygenation levels if this occurs and usually 98%  Patient was treated with 2-week course of Augmentin for bacterial sinusitis by Dr. Paulla Fore and this did resolve symptoms but only for a few days and then they recurred. Pain maxillary area with burning in nostrils and she has some pain into her teeth and ears bothering her.  Denies fever.  Symptoms worsened on Tuesday  Not related to shortness of breath-she also complains of some left upper chest pain that hurts with palpation and is not exertional-also relieved with osteopathic manipulation-unlikely to be cardiac in nature.  She also has some intermittent bloating A/P: Patient complains of ongoing sinus congestion/irritation/sinus pressure and pain/dental pain  Patient with continued issues with allergies and has upcoming allergy testing.  Intermittent issues with her asthma despite compliance with Arnuity-has follow-up with her allergist for allergy testing as above.  She will continue current medications for now.  Breathing issues are so intermittent and seem to be related anxiety-she can continue to use albuterol but I think there is certainly an anxiety element as well.  For her sinus issues-she would like to trial a different course of antibiotics to see if she has more prolonged resolution-does meet qualifications for bacterial sinusitis with double sickening recently-prior symptoms only resolved for a few days on Augmentin.  We will trial doxycycline for 10 days.  If fails to improve will refer to ENT or ask for allergist opinion  Recommended follow up: Return for as needed for new, worsening, persistent symptoms.  Future Appointments  Date Time Provider [place]  04/28/2020  4:15 PM Nigel Mormon, MD [facility] None  07/14/2020  9:30 AM Ward Givens, NP [facility] None   Lab/Order associations:   ICD-10-CM   1. Gastroesophageal reflux disease without  esophagitis  K21.9 Ambulatory referral to Gastroenterology  2. Intractable migraine with aura with status migrainosus  G43.111   3. Severe persistent asthma without complication  [id number]   4. Seasonal allergic rhinitis due to pollen  J30.1     Meds ordered this encounter  Medications  . doxycycline (VIBRA-TABS) 100 MG tablet    Sig: Take 1 tablet (100 mg total) by mouth 2 (two)  times daily.    Dispense:  20 tablet    Refill:  0   Return precautions advised.  Garret Reddish, MD

## 2020-04-08 MED ORDER — DILTIAZEM HCL ER COATED BEADS 120 MG PO CP24
120.0000 mg | ORAL_CAPSULE | Freq: Every day | ORAL | 3 refills | Status: DC
Start: 2020-04-08 — End: 2020-05-18

## 2020-04-08 NOTE — Telephone Encounter (Signed)
If she can take diltiazem, then does not need to take metoprolol.

## 2020-04-08 NOTE — Telephone Encounter (Signed)
Take it.

## 2020-04-08 NOTE — Telephone Encounter (Signed)
From patient.

## 2020-04-08 NOTE — Telephone Encounter (Signed)
Patient wants to know should she still take her second dose of metoprolol tonight. Please Advise

## 2020-04-08 NOTE — Telephone Encounter (Signed)
We could stop metoprolol and try diltiazem instead at 120 mg daily. It will not affect asthma. If patient agrees, please send 30 pills X 2 refills.  Thanks MJP

## 2020-04-09 ENCOUNTER — Encounter: Payer: Self-pay | Admitting: Family Medicine

## 2020-04-09 ENCOUNTER — Telehealth (INDEPENDENT_AMBULATORY_CARE_PROVIDER_SITE_OTHER): Payer: BC Managed Care – PPO | Admitting: Family Medicine

## 2020-04-09 ENCOUNTER — Other Ambulatory Visit: Payer: Self-pay | Admitting: Family Medicine

## 2020-04-09 VITALS — BP 115/73 | HR 80 | Temp 98.0°F | Ht 61.0 in | Wt 153.0 lb

## 2020-04-09 DIAGNOSIS — B9689 Other specified bacterial agents as the cause of diseases classified elsewhere: Secondary | ICD-10-CM

## 2020-04-09 DIAGNOSIS — J301 Allergic rhinitis due to pollen: Secondary | ICD-10-CM | POA: Diagnosis not present

## 2020-04-09 DIAGNOSIS — K219 Gastro-esophageal reflux disease without esophagitis: Secondary | ICD-10-CM

## 2020-04-09 DIAGNOSIS — J4550 Severe persistent asthma, uncomplicated: Secondary | ICD-10-CM

## 2020-04-09 DIAGNOSIS — J329 Chronic sinusitis, unspecified: Secondary | ICD-10-CM | POA: Diagnosis not present

## 2020-04-09 DIAGNOSIS — G43109 Migraine with aura, not intractable, without status migrainosus: Secondary | ICD-10-CM

## 2020-04-09 MED ORDER — DOXYCYCLINE HYCLATE 100 MG PO TABS
100.0000 mg | ORAL_TABLET | Freq: Two times a day (BID) | ORAL | 0 refills | Status: DC
Start: 2020-04-09 — End: 2020-05-18

## 2020-04-10 ENCOUNTER — Encounter: Payer: Self-pay | Admitting: Family Medicine

## 2020-04-11 ENCOUNTER — Encounter: Payer: Self-pay | Admitting: Family Medicine

## 2020-04-12 ENCOUNTER — Encounter: Payer: Self-pay | Admitting: Gastroenterology

## 2020-04-12 MED ORDER — CEFIXIME 400 MG PO CAPS: 400.0000 mg | ORAL_CAPSULE | Freq: Every day | ORAL | 0 refills | Status: DC

## 2020-04-13 ENCOUNTER — Encounter: Payer: Self-pay | Admitting: Family Medicine

## 2020-04-13 ENCOUNTER — Other Ambulatory Visit: Payer: Self-pay

## 2020-04-13 DIAGNOSIS — R0602 Shortness of breath: Secondary | ICD-10-CM

## 2020-04-13 DIAGNOSIS — R002 Palpitations: Secondary | ICD-10-CM

## 2020-04-16 ENCOUNTER — Telehealth: Payer: Self-pay | Admitting: Radiology

## 2020-04-16 ENCOUNTER — Other Ambulatory Visit: Payer: Self-pay

## 2020-04-16 ENCOUNTER — Encounter: Payer: Self-pay | Admitting: Cardiology

## 2020-04-16 ENCOUNTER — Ambulatory Visit (INDEPENDENT_AMBULATORY_CARE_PROVIDER_SITE_OTHER): Payer: BC Managed Care – PPO | Admitting: Cardiology

## 2020-04-16 ENCOUNTER — Encounter: Payer: Self-pay | Admitting: Family Medicine

## 2020-04-16 VITALS — BP 100/60 | HR 80 | Ht 61.5 in | Wt 152.0 lb

## 2020-04-16 DIAGNOSIS — I493 Ventricular premature depolarization: Secondary | ICD-10-CM

## 2020-04-16 DIAGNOSIS — R002 Palpitations: Secondary | ICD-10-CM

## 2020-04-16 LAB — BASIC METABOLIC PANEL
BUN/Creatinine Ratio: 12 (ref 9–23)
BUN: 11 mg/dL (ref 6–24)
CO2: 24 mmol/L (ref 20–29)
Calcium: 10.8 mg/dL — ABNORMAL HIGH (ref 8.7–10.2)
Chloride: 99 mmol/L (ref 96–106)
Creatinine, Ser: 0.89 mg/dL (ref 0.57–1.00)
GFR calc Af Amer: 93 mL/min/{1.73_m2} (ref >59)
GFR calc non Af Amer: 81 mL/min/{1.73_m2} (ref >59)
Glucose: 99 mg/dL (ref 65–99)
Potassium: 5.2 mmol/L (ref 3.5–5.2)
Sodium: 138 mmol/L (ref 134–144)

## 2020-04-16 LAB — MAGNESIUM: Magnesium: 2.3 mg/dL (ref 1.6–2.3)

## 2020-04-16 LAB — TSH: TSH: 1.02 u[IU]/mL (ref 0.450–4.500)

## 2020-04-16 NOTE — Patient Instructions (Signed)
Medication Instructions:  Your physician recommends that you continue on your current medications as directed. Please refer to the Current Medication list given to you today.  *If you need a refill on your cardiac medications before your next appointment, please call your pharmacy*   Lab Work: BMET, Mag, TSH  If you have labs (blood work) drawn today and your tests are completely normal, you will receive your results only by: Marland Kitchen MyChart Message (if you have MyChart) OR . A paper copy in the mail If you have any lab test that is abnormal or we need to change your treatment, we will call you to review the results.   Testing/Procedures:  [facility]- Long Term Monitor Instructions   Your physician has requested you wear your ZIO patch monitor 3 days.   This is a single patch monitor.  [employer] supplies one patch monitor per enrollment.  Additional stickers are not available.   Please do not apply patch if you will be having a Nuclear Stress Test, Echocardiogram, Cardiac CT, MRI, or Chest Xray during the time frame you would be wearing the monitor. The patch cannot be worn during these tests.  You cannot remove and re-apply the ZIO XT patch monitor.   Your ZIO patch monitor will be sent USPS Priority mail from [facility] directly to your home address. The monitor may also be mailed to a PO BOX if home delivery is not available.   It may take 3-5 days to receive your monitor after you have been enrolled.   Once you have received you monitor, please review enclosed instructions.  Your monitor has already been registered assigning a specific monitor serial # to you.   Applying the monitor   Shave hair from upper left chest.   Hold abrader disc by orange tab.  Rub abrader in 40 strokes over left upper chest as indicated in your monitor instructions.   Clean area with 4 enclosed alcohol pads .  Use all pads to assure are is cleaned thoroughly.  Let dry.   Apply patch as indicated in  monitor instructions.  Patch will be place under collarbone on left side of chest with arrow pointing upward.   Rub patch adhesive wings for 2 minutes.Remove white label marked "1".  Remove white label marked "2".  Rub patch adhesive wings for 2 additional minutes.   While looking in a mirror, press and release button in center of patch.  A small green light will flash 3-4 times .  This will be your only indicator the monitor has been turned on.     Do not shower for the first 24 hours.  You may shower after the first 24 hours.   Press button if you feel a symptom. You will hear a small click.  Record Date, Time and Symptom in the Patient Log Book.   When you are ready to remove patch, follow instructions on last 2 pages of Patient Log Book.  Stick patch monitor onto last page of Patient Log Book.   Place Patient Log Book in [place] box.  Use locking tab on box and tape box closed securely.  The [employer] and [employer] has [employer] on it.  Please place in mailbox as soon as possible.  Your physician should have your test results approximately 7 days after the monitor has been mailed back to [facility].   Call [facility] at [phone] if you have questions regarding your ZIO XT patch monitor.  Call them  immediately if you see an orange light blinking on your monitor.   If your monitor falls off in less than 4 days contact our Monitor department at [phone].  If your monitor becomes loose or falls off after 4 days call [employer] at [phone] for suggestions on securing your monitor.     Follow-Up: At [facility], you and your health needs are our priority.  As part of our continuing mission to provide you with exceptional heart care, we have created designated Provider Care Teams.  These Care Teams include your primary Cardiologist (physician) and Advanced Practice Providers (APPs -  Physician Assistants and Nurse Practitioners) who all work together to provide  you with the care you need, when you need it.  We recommend signing up for the patient portal called "MyChart".  Sign up information is provided on this After Visit Summary.  MyChart is used to connect with patients for Virtual Visits (Telemedicine).  Patients are able to view lab/test results, encounter notes, upcoming appointments, etc.  Non-urgent messages can be sent to your provider as well.   To learn more about what you can do with MyChart, go to [email or web address].    Your next appointment:   2 month(s)  The format for your next appointment:   Either In Person or Virtual  Provider:   Oswaldo Milian, MD

## 2020-04-16 NOTE — Telephone Encounter (Signed)
Enrolled patient for a 3 day Zio monitor to be mailed to patients home.  

## 2020-04-16 NOTE — Progress Notes (Signed)
Cardiology Office Note:    Date:  04/17/2020   ID:  Debra, Nelson 1981-08-22, MRN [id number]  PCP:  Marin Olp, MD  Cardiologist:  No primary care provider on file.  Electrophysiologist:  None   Referring MD: Marin Olp, MD   Chief Complaint  Patient presents with  . Palpitations    History of Present Illness:    Debra Nelson is a 39 y.o. female with a hx of bipolar disorder, PTSD, asthma, GERD, migraines, narcolepsy, anxiety who is referred by Dr. Yong Channel for evaluation of palpitations.  She has been following with Dr. Virgina Jock for cardiac evaluation.  Work-up has included ETT with low exercise capacity (4.7 METS) but no ischemic changes on EKG.  Event monitor showed occasional PVCs.  TTE on 08/24/2019 showed LVEF 55%, no significant valvular disease.  She recently had an episode of syncope and presented to the ED for evaluation.  Work-up was unremarkable, was thought to be complex migraine.  She was started on metoprolol 25 mg twice daily for symptomatic PVCs.  Previously had been on propranol and diltiazem.   She reports that she has episodes of palpitations multiple times per day.  Last few seconds and resolve.  Feels like heart is skipping beats.  Sometimes gets lightheaded if having multiple skipped beats in short time.  She restarted metoprolol last week.  She also reports occasional chest pain that she describes as a stabbing pain on the left side of the chest that occurs with certain arm movements and if she presses on her chest.  She does not exercise regularly due to foot pain.  She does take her dog for walks.  Reports some shortness of breath with this.  No smoking history.  She drinks one 8 ounce Coke per day.  No alcohol use.  No history of heart disease in her immediate family.   Past Medical History:  Diagnosis Date  . Asthma   . Bipolar disorder with depression ([place])    and PTSD  . Chicken pox   . Chronic migraine w/o aura, not intractable, w  stat migr   . Deviated septum   . DJD (degenerative joint disease)    Severe of the right knee-Dr Creighton-ortho and Dr Gean Birchwood, PA-preferred pain managment  . GERD (gastroesophageal reflux disease)   . Narcolepsy   . PTSD (post-traumatic stress disorder)   . Seasonal allergic rhinitis     Past Surgical History:  Procedure Laterality Date  . knee surgery x5     08,11, 12, 13 x2- R knee  . right foot surgery     arthritis/bone spurus. plate placed.   . WISDOM TOOTH EXTRACTION      Current Medications: Current Meds  Medication Sig  . albuterol (2.5 MG/3ML) 0.083% NEBU 3 mL, albuterol (5 MG/ML) 0.5% NEBU 0.5 mL Inhale into the lungs daily as needed.  Marland Kitchen albuterol (PROAIR HFA) 108 (90 Base) MCG/ACT inhaler Inhale 2 puffs into the lungs every 6 (six) hours as needed for wheezing or shortness of breath.  . ALPRAZolam (XANAX) 0.5 MG tablet Take 0.5 mg by mouth as needed for anxiety.   . Armodafinil 150 MG tablet Take 1 tablet (150 mg total) by mouth daily.  . ARNUITY ELLIPTA 200 MCG/ACT AEPB Inhale 1 puff into the lungs daily.  . Biotin w/ Vitamins C & E (HAIR/SKIN/NAILS PO) Take by mouth.  . busPIRone (BUSPAR) 15 MG tablet Take 15 mg by mouth 3 (three) times daily.  . cefixime (SUPRAX)  400 MG CAPS capsule Take 1 capsule (400 mg total) by mouth daily.  . cyclobenzaprine (FLEXERIL) 10 MG tablet Take 10 mg by mouth as needed.  . desloratadine (CLARINEX) 5 MG tablet Take 5 mg by mouth daily.  Marland Kitchen diltiazem (CARDIZEM CD) 120 MG 24 hr capsule Take 1 capsule (120 mg total) by mouth daily.  Marland Kitchen doxycycline (VIBRA-TABS) 100 MG tablet Take 1 tablet (100 mg total) by mouth 2 (two) times daily.  Marland Kitchen EPIPEN 2-PAK 0.3 MG/0.3ML SOAJ injection as needed.  . famotidine (PEPCID) 20 MG tablet Take 20 mg by mouth 2 (two) times daily.  . fluticasone (FLONASE SENSIMIST) 27.5 MCG/SPRAY nasal spray Place 2 sprays into the nose daily.  Marland Kitchen gabapentin (NEURONTIN) 300 MG capsule Take 300 mg by mouth 3 (three)  times daily.  . IPRATROPIUM BROMIDE HFA IN Inhale into the lungs.  . lamoTRIgine (LAMICTAL) 150 MG tablet Take 150 mg by mouth 2 (two) times daily.   Marland Kitchen levalbuterol (XOPENEX HFA) 45 MCG/ACT inhaler Inhale 1-2 puffs into the lungs every 6 (six) hours as needed for wheezing (if having issues with high heart rate on albuterol).  Marland Kitchen loratadine (CLARITIN) 10 MG tablet Take 10 mg by mouth daily.  . magnesium oxide (MAG-OX) 400 MG tablet Take 400 mg by mouth every evening.  . metoprolol tartrate (LOPRESSOR) 25 MG tablet Take 25 mg by mouth 2 (two) times daily.  . Multiple Vitamins-Minerals (HAIR SKIN AND NAILS FORMULA PO) Take 1 tablet by mouth daily.   . Olopatadine HCl 0.6 % SOLN Place 1 spray into both nostrils in the morning and at bedtime.   . ondansetron (ZOFRAN-ODT) 4 MG disintegrating tablet DISSOLVE ONE TABLET BY MOUTH EVERY 8 HOURS AS NEEDED FOR NAUSEA/ VOMITING  . pantoprazole (PROTONIX) 40 MG tablet Take 1 tablet (40 mg total) by mouth daily.  . promethazine (PHENERGAN) 25 MG tablet TAKE ONE TABLET BY MOUTH EVERY 6 HOURS AS NEEDED FOR NAUSEA AND VOMITING  . pyridoxine (B-6) 100 MG tablet Take 100 mg by mouth in the morning and at bedtime.   . Sodium Oxybate 500 MG/ML SOLN Take 3.5gms at bedtime. Take 4.5gms 2 hours later. Total of 8gms per night. Dispense 36ml.  . SUMAtriptan (IMITREX) 100 MG tablet TAKE 1/2 TO 1  TABLET BY MOUTH AT ONSET OF HEADACHE; MAY REPEAT ONE TABLET IN 2 HOURS IF NEEDED. (Patient taking differently: Take 50-100 mg by mouth every 2 (two) hours as needed for headache. )  . SYMBICORT 160-4.5 MCG/ACT inhaler   . Thiamine HCl (VITAMIN B-1) 250 MG tablet Take 250 mg by mouth daily.  . vitamin B-12 (CYANOCOBALAMIN) 250 MCG tablet Take 250 mcg by mouth in the morning and at bedtime.      Allergies:   Other, Klonopin [clonazepam], Phenergan [promethazine hcl], Promethazine, Promethazine hcl, and Topamax [topiramate]   Social History   Socioeconomic History  . Marital  status: Married    Spouse name: [name]  . Number of children: 3  . Years of education: College  . Highest education level: Not on file  Occupational History    Employer: OTHER  Tobacco Use  . Smoking status: Never Smoker  . Smokeless tobacco: Never Used  Substance and Sexual Activity  . Alcohol use: No  . Drug use: No  . Sexual activity: Yes    Partners: Male    Birth control/protection: Pill  Other Topics Concern  . Not on file  Social History Narrative   Patient is married ([name]) and lives at home  with her family   4 adults - 2 kids 50% of the time (1 son, 1 daughter). 2 older children (one age 14 from rape, then 57 year old- does not get to see- related to bipolar) Moved in with in laws to help them      Patient has 4 children (3 of her own and 1 step son).    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   Patient is right-handed.   Social Determinants of Health   Financial Resource Strain:   . Difficulty of Paying Living Expenses:   Food Insecurity:   . Worried About Charity fundraiser in the Last Year:   . Arboriculturist in the Last Year:   Transportation Needs:   . Film/video editor (Medical):   Marland Kitchen Lack of Transportation (Non-Medical):   Physical Activity:   . Days of Exercise per Week:   . Minutes of Exercise per Session:   Stress:   . Feeling of Stress :   Social Connections:   . Frequency of Communication with Friends and Family:   . Frequency of Social Gatherings with Friends and Family:   . Attends Religious Services:   . Active Member of Clubs or Organizations:   . Attends Archivist Meetings:   Marland Kitchen Marital Status:      Family History: The patient's family history includes Cancer in her paternal grandfather; Heart murmur in her brother; Mental illness in her mother; Other in her brother and father.  ROS:   Please see the history of  present illness.     All other systems reviewed and are negative.  EKGs/Labs/Other Studies Reviewed:    The following studies were reviewed today:  EKG:  EKG is ordered today.  The ekg ordered today demonstrates sinus rhythm, rate 80, motion artifact  Recent Labs: 04/05/2020: ALT 18; Hemoglobin 15.6; Platelets 418 04/16/2020: BUN 11; Creatinine, Ser 0.89; Magnesium 2.3; Potassium 5.2; Sodium 138; TSH 1.020  Recent Lipid Panel    Component Value Date/Time   CHOL 170 01/30/2018 0915   TRIG 91 01/30/2018 0915   HDL 57 01/30/2018 0915   LDLCALC 95 01/30/2018 0915    Physical Exam:    VS:  BP 100/60   Pulse 80   Ht 5' 1.5" (1.562 m)   Wt 152 lb (68.9 kg)   SpO2 99%   BMI 28.26 kg/m     Wt Readings from Last 3 Encounters:  04/16/20 152 lb (68.9 kg)  04/09/20 153 lb (69.4 kg)  04/05/20 153 lb (69.4 kg)     GEN:  Well nourished, well developed in no acute distress HEENT: Normal NECK: No JVD; No carotid bruits LYMPHATICS: No lymphadenopathy CARDIAC: RRR, no murmurs, rubs, gallops RESPIRATORY:  Clear to auscultation without rales, wheezing or rhonchi  ABDOMEN: Soft, non-tender, non-distended MUSCULOSKELETAL:  No edema; No deformity  SKIN: Warm and dry NEUROLOGIC:  Alert and oriented x 3 PSYCHIATRIC:  Normal affect   ASSESSMENT:    1. PVC's (premature ventricular contractions)   2. Palpitations    PLAN:    Palpitations/PVCs: work-up with with Dr. Virgina Jock has included ETT with low exercise capacity (4.7 METS) but no ischemic changes on EKG.  Event monitor showed occasional PVCs.  TTE on 08/24/2019 showed LVEF 55%, no significant valvular disease.  Symptoms felt to be symptomatic PVCs, she was  started on metoprolol 25 mg twice daily.  Will check Zio patch x3 days to evaluate for PVC burden on metoprolol.  Will check BMP, magnesium, TSH  RTC in 2 months  Medication Adjustments/Labs and Tests Ordered: Current medicines are reviewed at length with the patient today.   Concerns regarding medicines are outlined above.  Orders Placed This Encounter  Procedures  . Basic metabolic panel  . Magnesium  . TSH  . LONG TERM MONITOR (3-14 DAYS)  . EKG 12-Lead   No orders of the defined types were placed in this encounter.   Patient Instructions  Medication Instructions:  Your physician recommends that you continue on your current medications as directed. Please refer to the Current Medication list given to you today.  *If you need a refill on your cardiac medications before your next appointment, please call your pharmacy*   Lab Work: BMET, Mag, TSH  If you have labs (blood work) drawn today and your tests are completely normal, you will receive your results only by: Marland Kitchen MyChart Message (if you have MyChart) OR . A paper copy in the mail If you have any lab test that is abnormal or we need to change your treatment, we will call you to review the results.   Testing/Procedures:  [facility]- Long Term Monitor Instructions   Your physician has requested you wear your ZIO patch monitor 3 days.   This is a single patch monitor.  [employer] supplies one patch monitor per enrollment.  Additional stickers are not available.   Please do not apply patch if you will be having a Nuclear Stress Test, Echocardiogram, Cardiac CT, MRI, or Chest Xray during the time frame you would be wearing the monitor. The patch cannot be worn during these tests.  You cannot remove and re-apply the ZIO XT patch monitor.   Your ZIO patch monitor will be sent USPS Priority mail from [facility] directly to your home address. The monitor may also be mailed to a PO BOX if home delivery is not available.   It may take 3-5 days to receive your monitor after you have been enrolled.   Once you have received you monitor, please review enclosed instructions.  Your monitor has already been registered assigning a specific monitor serial # to you.   Applying the monitor   Shave hair from  upper left chest.   Hold abrader disc by orange tab.  Rub abrader in 40 strokes over left upper chest as indicated in your monitor instructions.   Clean area with 4 enclosed alcohol pads .  Use all pads to assure are is cleaned thoroughly.  Let dry.   Apply patch as indicated in monitor instructions.  Patch will be place under collarbone on left side of chest with arrow pointing upward.   Rub patch adhesive wings for 2 minutes.Remove white label marked "1".  Remove white label marked "2".  Rub patch adhesive wings for 2 additional minutes.   While looking in a mirror, press and release button in center of patch.  A small green light will flash 3-4 times .  This will be your only indicator the monitor has been turned on.     Do not shower for the first 24 hours.  You may shower after the first 24 hours.   Press button if you feel a symptom. You will hear a small click.  Record Date, Time and Symptom in the Patient Log Book.   When you are ready to remove patch,  follow instructions on last 2 pages of Patient Log Book.  Stick patch monitor onto last page of Patient Log Book.   Place Patient Log Book in Twilight box.  Use locking tab on box and tape box closed securely.  The [employer] and [employer] has [employer] on it.  Please place in mailbox as soon as possible.  Your physician should have your test results approximately 7 days after the monitor has been mailed back to [facility].   Call [facility] at [phone] if you have questions regarding your ZIO XT patch monitor.  Call them immediately if you see an orange light blinking on your monitor.   If your monitor falls off in less than 4 days contact our Monitor department at [phone].  If your monitor becomes loose or falls off after 4 days call [employer] at [phone] for suggestions on securing your monitor.     Follow-Up: At [facility], you and your health needs are our priority.  As part of our  continuing mission to provide you with exceptional heart care, we have created designated Provider Care Teams.  These Care Teams include your primary Cardiologist (physician) and Advanced Practice Providers (APPs -  Physician Assistants and Nurse Practitioners) who all work together to provide you with the care you need, when you need it.  We recommend signing up for the patient portal called "MyChart".  Sign up information is provided on this After Visit Summary.  MyChart is used to connect with patients for Virtual Visits (Telemedicine).  Patients are able to view lab/test results, encounter notes, upcoming appointments, etc.  Non-urgent messages can be sent to your provider as well.   To learn more about what you can do with MyChart, go to [email or web address].    Your next appointment:   2 month(s)  The format for your next appointment:   Either In Person or Virtual  Provider:   Oswaldo Milian, MD       Signed, Donato Heinz, MD  04/17/2020 12:46 AM    [place]

## 2020-04-21 ENCOUNTER — Encounter: Payer: Self-pay | Admitting: Family Medicine

## 2020-04-25 ENCOUNTER — Other Ambulatory Visit (INDEPENDENT_AMBULATORY_CARE_PROVIDER_SITE_OTHER): Payer: BC Managed Care – PPO

## 2020-04-25 ENCOUNTER — Encounter: Payer: Self-pay | Admitting: Family Medicine

## 2020-04-25 DIAGNOSIS — R002 Palpitations: Secondary | ICD-10-CM

## 2020-04-25 DIAGNOSIS — I493 Ventricular premature depolarization: Secondary | ICD-10-CM

## 2020-04-28 ENCOUNTER — Ambulatory Visit: Payer: BC Managed Care – PPO | Admitting: Cardiology

## 2020-05-10 DIAGNOSIS — J0141 Acute recurrent pansinusitis: Secondary | ICD-10-CM | POA: Insufficient documentation

## 2020-05-10 DIAGNOSIS — J343 Hypertrophy of nasal turbinates: Secondary | ICD-10-CM | POA: Insufficient documentation

## 2020-05-12 ENCOUNTER — Telehealth: Payer: Self-pay | Admitting: Neurology

## 2020-05-12 ENCOUNTER — Encounter: Payer: Self-pay | Admitting: Neurology

## 2020-05-12 ENCOUNTER — Other Ambulatory Visit: Payer: Self-pay | Admitting: Neurology

## 2020-05-12 ENCOUNTER — Other Ambulatory Visit: Payer: Self-pay

## 2020-05-12 DIAGNOSIS — G4753 Recurrent isolated sleep paralysis: Secondary | ICD-10-CM

## 2020-05-12 DIAGNOSIS — G47411 Narcolepsy with cataplexy: Secondary | ICD-10-CM

## 2020-05-12 MED ORDER — SODIUM OXYBATE 500 MG/ML PO SOLN: ORAL | 5 refills | Status: DC

## 2020-05-12 NOTE — Telephone Encounter (Signed)
Pharmacist  Arbie Cookey called from [place]   And she wanted to make Debra Nelson aware that patient is taking ativan now . Arbie Cookey has given patient directions to take Xyrem and Ativan 6 hours apart . Arbie Cookey still needs a call back verifying that we received message please call [phone] opt . Then opt. 4

## 2020-05-13 NOTE — Telephone Encounter (Signed)
Called the xyrem pharmacy and advised that Dr Brett Fairy is aware of the medication change and approved the change. We have also provided guidance to the patient in regards to taking the medication with Xyrem.

## 2020-05-15 ENCOUNTER — Other Ambulatory Visit: Payer: Self-pay | Admitting: Family Medicine

## 2020-05-15 DIAGNOSIS — K219 Gastro-esophageal reflux disease without esophagitis: Secondary | ICD-10-CM

## 2020-05-18 ENCOUNTER — Ambulatory Visit (INDEPENDENT_AMBULATORY_CARE_PROVIDER_SITE_OTHER): Payer: BC Managed Care – PPO | Admitting: Gastroenterology

## 2020-05-18 ENCOUNTER — Encounter: Payer: Self-pay | Admitting: Gastroenterology

## 2020-05-18 VITALS — BP 120/70 | HR 80 | Ht 61.5 in | Wt 156.1 lb

## 2020-05-18 DIAGNOSIS — R1013 Epigastric pain: Secondary | ICD-10-CM

## 2020-05-18 DIAGNOSIS — R140 Abdominal distension (gaseous): Secondary | ICD-10-CM | POA: Diagnosis not present

## 2020-05-18 DIAGNOSIS — R12 Heartburn: Secondary | ICD-10-CM

## 2020-05-18 DIAGNOSIS — R194 Change in bowel habit: Secondary | ICD-10-CM | POA: Diagnosis not present

## 2020-05-18 DIAGNOSIS — G8929 Other chronic pain: Secondary | ICD-10-CM

## 2020-05-18 NOTE — Patient Instructions (Signed)
If you are age 41 or older, your body mass index should be between 23-30. Your Body mass index is 29.02 kg/m. If this is out of the aforementioned range listed, please consider follow up with your Primary Care Provider.  If you are age 49 or younger, your body mass index should be between 19-25. Your Body mass index is 29.02 kg/m. If this is out of the aformentioned range listed, please consider follow up with your Primary Care Provider.   You have been scheduled for an endoscopy and colonoscopy. Please follow the written instructions given to you at your visit today. Please pick up your prep supplies at the pharmacy within the next 1-3 days. If you use inhalers (even only as needed), please bring them with you on the day of your procedure.  It was a pleasure to see you today!   Dr. Loletha Carrow

## 2020-05-18 NOTE — Progress Notes (Signed)
[facility] Consult Note:  History: Debra Nelson 05/18/2020  Referring provider: Marin Olp, MD  Reason for consult/chief complaint: Gastroesophageal Reflux, Bloated, nausea and vomiting, and Diarrhea (loose stools)   Subjective  HPI: Primary care visit 04/09/2020 for multiple issues, 1 of which was GERD incompletely responsive to PPI and H2 blocker.  Patient also had sinus congestion, believed to be an allergy component.  Had received Augmentin shortly before that, then at that visit was treated with doxycycline.  Debra Nelson is a 39 year old woman referred by primary care for multiple GI symptoms.  For years she has had intermittent pyrosis and a feeling of globus sensation for which she would take Pepcid or a PPI on occasion.  She might go through long periods of time with many months of no symptoms and no meds needed.  This year she has had more frequent symptoms that are not responding well to combination of Pepcid and pantoprazole.  She has upper abdominal burning and bloating much of the time.  She is also had alternating bowel habits between constipation, normal and "mushy" stool.  She denies rectal bleeding, has no prior GI evaluation.  She also is bothered by chronic sinus congestion, feels that she might need an ENT procedure in the near future, and has been suffering from severe anxiety over many months as well.  She is wondering whether some of the increased stressors have been contributing to the GI symptoms.   ROS:  Review of Systems  Constitutional: Negative for appetite change and unexpected weight change.  HENT: Negative for mouth sores and voice change.   Eyes: Negative for pain and redness.  Respiratory: Negative for cough and shortness of breath.   Cardiovascular: Negative for chest pain and palpitations.  Genitourinary: Negative for dysuria and hematuria.  Musculoskeletal: Positive for back pain. Negative for arthralgias and myalgias.    Skin: Negative for pallor and rash.  Allergic/Immunologic: Positive for environmental allergies.  Neurological: Negative for weakness and headaches.  Hematological: Negative for adenopathy.  Psychiatric/Behavioral:       Anxiety     Past Medical History: Past Medical History:  Diagnosis Date  . Anxiety   . Arthritis   . Asthma   . Bipolar disorder with depression ([place]a)    and PTSD  . Chicken pox   . Chronic migraine w/o aura, not intractable, w stat migr   . Depression   . Deviated septum   . DJD (degenerative joint disease)    Severe of the right knee-Dr Creighton-ortho and Dr Gean Birchwood, PA-preferred pain managment  . GERD (gastroesophageal reflux disease)   . Narcolepsy   . PTSD (post-traumatic stress disorder)   . PVC (premature ventricular contraction)   . Seasonal allergic rhinitis      Past Surgical History: Past Surgical History:  Procedure Laterality Date  . knee surgery x5     08,11, 12, 13 x2- R knee  . right foot surgery     x 3, arthritis/bone spurus. plate placed.   . WISDOM TOOTH EXTRACTION       Family History: Family History  Problem Relation Age of Onset  . Mental illness Mother        does not speak to regularly so does not know full history  . Other Father        medical issues, divorce related  . Other Brother        ptsd- fully discharged medically disabled before age 81  . Heart murmur Brother   .  Cancer Paternal Grandfather        type unknown    Social History: Social History   Socioeconomic History  . Marital status: Married    Spouse name: [name]  . Number of children: 3  . Years of education: College  . Highest education level: Not on file  Occupational History    Employer: OTHER  Tobacco Use  . Smoking status: Never Smoker  . Smokeless tobacco: Never Used  Vaping Use  . Vaping Use: Never used  Substance and Sexual Activity  . Alcohol use: No  . Drug use: No  . Sexual activity: Yes    Partners: Male    Birth  control/protection: Pill  Other Topics Concern  . Not on file  Social History Narrative   Patient is married ([name]) and lives at home with her family   4 adults - 2 kids 50% of the time (1 son, 1 daughter). 2 older children (one age 62 from rape, then 12 year old- does not get to see- related to bipolar) Moved in with in laws to help them      Patient has 4 children (3 of her own and 1 step son).    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   Patient is right-handed.   Social Determinants of Health   Financial Resource Strain:   . Difficulty of Paying Living Expenses:   Food Insecurity:   . Worried About Charity fundraiser in the Last Year:   . Arboriculturist in the Last Year:   Transportation Needs:   . Film/video editor (Medical):   Marland Kitchen Lack of Transportation (Non-Medical):   Physical Activity:   . Days of Exercise per Week:   . Minutes of Exercise per Session:   Stress:   . Feeling of Stress :   Social Connections:   . Frequency of Communication with Friends and Family:   . Frequency of Social Gatherings with Friends and Family:   . Attends Religious Services:   . Active Member of Clubs or Organizations:   . Attends Archivist Meetings:   Marland Kitchen Marital Status:     Allergies: Allergies  Allergen Reactions  . Klonopin [Clonazepam] Nausea Only  . Phenergan [Promethazine Hcl]     IV causes muscle tics, may take oral, or give benadryl prior to IV  . Promethazine Other (See Comments)    She cannot take IV phenergan unless this it is given with Benadryl.  . Promethazine Hcl Other (See Comments)    IV causes muscle tics, may take oral, or give benadryl prior to IV  . Topamax [Topiramate] Nausea And Vomiting    confusion    Outpatient Meds: Current Outpatient Medications  Medication Sig Dispense Refill  . albuterol (2.5 MG/3ML) 0.083% NEBU 3 mL, albuterol  (5 MG/ML) 0.5% NEBU 0.5 mL Inhale into the lungs daily as needed.    Marland Kitchen albuterol (PROAIR HFA) 108 (90 Base) MCG/ACT inhaler Inhale 2 puffs into the lungs every 6 (six) hours as needed for wheezing or shortness of breath.    . ALPRAZolam (XANAX) 0.5 MG tablet Take 0.5 mg by mouth as needed for anxiety.     . Armodafinil 150 MG tablet Take 1 tablet (150 mg total) by mouth daily. 30 tablet 4  . azelastine (OPTIVAR) 0.05 % ophthalmic solution     .  Biotin w/ Vitamins C & E (HAIR/SKIN/NAILS PO) Take by mouth.    . busPIRone (BUSPAR) 15 MG tablet Take 15 mg by mouth 3 (three) times daily.    . cyclobenzaprine (FLEXERIL) 10 MG tablet Take 10 mg by mouth as needed.    . desloratadine (CLARINEX) 5 MG tablet Take 5 mg by mouth daily.    Marland Kitchen EPIPEN 2-PAK 0.3 MG/0.3ML SOAJ injection as needed.    . famotidine (PEPCID) 20 MG tablet Take 20 mg by mouth 2 (two) times daily.    . fluticasone (FLONASE SENSIMIST) 27.5 MCG/SPRAY nasal spray Place 2 sprays into the nose daily.    Marland Kitchen gabapentin (NEURONTIN) 300 MG capsule Take 300 mg by mouth 3 (three) times daily.    Marland Kitchen ipratropium (ATROVENT) 0.06 % nasal spray Place 2 sprays into both nostrils 2 (two) times daily.    . IPRATROPIUM BROMIDE HFA IN Inhale into the lungs.    . lamoTRIgine (LAMICTAL) 200 MG tablet Take 400 mg by mouth at bedtime.    . levalbuterol (XOPENEX HFA) 45 MCG/ACT inhaler Inhale 1-2 puffs into the lungs every 6 (six) hours as needed for wheezing (if having issues with high heart rate on albuterol). 1 Inhaler 5  . loratadine (CLARITIN) 10 MG tablet Take 10 mg by mouth daily.    . metoprolol tartrate (LOPRESSOR) 25 MG tablet Take 25 mg by mouth 2 (two) times daily.    . Multiple Vitamins-Minerals (HAIR SKIN AND NAILS FORMULA PO) Take 1 tablet by mouth daily.     Marland Kitchen olopatadine (PATANOL) 0.1 % ophthalmic solution     . Olopatadine HCl 0.6 % SOLN Place 1 spray into both nostrils in the morning and at bedtime.     . ondansetron (ZOFRAN-ODT) 4 MG  disintegrating tablet DISSOLVE ONE TABLET BY MOUTH EVERY 8 HOURS AS NEEDED FOR NAUSEA/ VOMITING 18 tablet 0  . pantoprazole (PROTONIX) 40 MG tablet TAKE ONE TABLET BY MOUTH DAILY 30 tablet 0  . promethazine (PHENERGAN) 25 MG tablet TAKE ONE TABLET BY MOUTH EVERY 6 HOURS AS NEEDED FOR NAUSEA AND VOMITING 90 tablet 0  . pyridoxine (B-6) 100 MG tablet Take 100 mg by mouth in the morning and at bedtime.     . Sodium Oxybate 500 MG/ML SOLN Take 3.5gms at bedtime. Take 4.5gms 2 hours later. Total of 8gms per night. Dispense 370ml. 360 mL 5  . SUMAtriptan (IMITREX) 100 MG tablet TAKE 1/2 TO 1  TABLET BY MOUTH AT ONSET OF HEADACHE; MAY REPEAT ONE TABLET IN 2 HOURS IF NEEDED. (Patient taking differently: Take 50-100 mg by mouth every 2 (two) hours as needed for headache. ) 10 tablet 5  . SYMBICORT 160-4.5 MCG/ACT inhaler     . Thiamine HCl (VITAMIN B-1) 250 MG tablet Take 250 mg by mouth daily.    . vitamin B-12 (CYANOCOBALAMIN) 250 MCG tablet Take 250 mcg by mouth in the morning and at bedtime.      No current facility-administered medications for this visit.      ___________________________________________________________________ Objective   Exam:  BP 120/70 (BP Location: Left Arm, Patient Position: Sitting, Cuff Size: Normal)   Pulse 80   Ht 5' 1.5" (1.562 m) Comment: height measured without shoes  Wt 156 lb 2 oz (70.8 kg)   LMP 05/16/2020   BMI 29.02 kg/m    General: Pleasant, well-appearing, normal vocal quality  Eyes: sclera anicteric, no redness  ENT: oral mucosa moist without lesions, no cervical or supraclavicular lymphadenopathy  CV: RRR without  murmur, S1/S2, no JVD, no peripheral edema  Resp: clear to auscultation bilaterally, normal RR and effort noted  GI: soft, epigastric tenderness, with active bowel sounds. No guarding or palpable organomegaly noted.  Skin; warm and dry, no rash or jaundice noted  Neuro: awake, alert and oriented x 3. Normal gross motor function and  fluent speech  Labs:  CBC Latest Ref Rng & Units 04/05/2020 04/05/2020 01/31/2020  WBC 4.0 - 10.5 K/uL - 10.2 7.5  Hemoglobin 12.0 - 15.0 g/dL 15.6(H) 15.5(H) 15.3(H)  Hematocrit 36 - 46 % 46.0 46.2(H) 45.9  Platelets 150 - 400 K/uL - 418(H) 388   CMP Latest Ref Rng & Units 04/16/2020 04/05/2020 04/05/2020  Glucose 65 - 99 mg/dL 99 103(H) 102(H)  BUN 6 - 24 mg/dL 11 11 10   Creatinine 0.57 - 1.00 mg/dL 0.89 0.90 0.91  Sodium 134 - 144 mmol/L 138 137 140  Potassium 3.5 - 5.2 mmol/L 5.2 3.7 3.7  Chloride 96 - 106 mmol/L 99 105 101  CO2 20 - 29 mmol/L 24 - 24  Calcium 8.7 - 10.2 mg/dL 10.8(H) - 10.3  Total Protein 6.5 - 8.1 g/dL - - 8.1  Total Bilirubin 0.3 - 1.2 mg/dL - - 1.3(H)  Alkaline Phos 38 - 126 U/L - - 108  AST 15 - 41 U/L - - 19  ALT 0 - 44 U/L - - 18     Radiologic Studies: No abdominal imaging  Assessment: Encounter Diagnoses  Name Primary?  . Heartburn Yes  . Abdominal pain, chronic, epigastric   . Abdominal bloating   . Altered bowel habits     Difficult to determine how or if all the symptoms may be related.  I suspect there is a probable functional component given her recent stressors.  Reflux symptoms have been variably responsive to acid suppression.  She has a globus sensation, though no real dysphagia.  She is frustrated by her multiple GI and other issues, is hoping for some answers and reassurance.  Plan:  I offered upper endoscopy and colonoscopy.  She was agreeable after discussion of procedure and risks.  The benefits and risks of the planned procedure were described in detail with the patient or (when appropriate) their health care proxy.  Risks were outlined as including, but not limited to, bleeding, infection, perforation, adverse medication reaction leading to cardiac or pulmonary decompensation, pancreatitis (if ERCP).  The limitation of incomplete mucosal visualization was also discussed.  No guarantees or warranties were given.  We will plan to  biopsy small bowel to rule out celiac sprue.  She was agreeable to procedures, signed consent, and said she would contact our office in a couple of days to make the final arrangements and she had to get her daughter to another appointment today.  No changes to her medical therapy at this point.  Thank you for the courtesy of this consult.  Please call me with any questions or concerns.  Nelida Meuse III  CC: Referring provider noted above

## 2020-05-25 ENCOUNTER — Telehealth: Payer: Self-pay | Admitting: Gastroenterology

## 2020-05-25 ENCOUNTER — Ambulatory Visit: Payer: BC Managed Care – PPO | Admitting: Cardiology

## 2020-05-25 ENCOUNTER — Encounter: Payer: Self-pay | Admitting: Gastroenterology

## 2020-05-25 NOTE — Telephone Encounter (Signed)
Ok, please schedule

## 2020-06-01 ENCOUNTER — Telehealth: Payer: Self-pay | Admitting: *Deleted

## 2020-06-01 ENCOUNTER — Ambulatory Visit: Payer: BC Managed Care – PPO | Admitting: Cardiology

## 2020-06-01 NOTE — Telephone Encounter (Signed)
See mychart encounter  Patient called to reschedule.  appt 7/27

## 2020-06-01 NOTE — Progress Notes (Deleted)
[facility]    Provider:  Dr Jaynee Eagles Requesting Provider: Larey Seat MD Primary Care Provider:  Marin Olp, MD  CC:  ***  HPI:  Debra Nelson is a 39 y.o. female here as requested by Larey Seat MD for second opinion on headaches.  She has a past medical history of narcolepsy, bipolar disease, PTSD, migraine headaches.  She is managed by my colleague Dr. Brett Fairy for narcolepsy with cataplexy.  I reviewed her notes, at last visit with Ward Givens, NP in February of this year she stated she had an increase in headaches in the prior month, sometimes sharp or dull, she does have photophobia and phonophobia on occasion, denied nausea and vomiting, Tylenol and sumatriptan hand worked intermittently, in the past she was on Schleicher.  Detailed neurologic exam was normal.  She was asked to keep a migraine journal.  In May of this year she was seen in the emergency room and I reviewed neurology notes, patient noticed that she had tingling and heaviness in the right aspect of the face and some slurred speech, she drove her self to [facility] ED, code stroke was initiated, initial exam showed some hesitancy in speech and stuttering, she did states she felt anxious, after MRI she stated that she felt heaviness on the other side of the face on the left.  On further discussion, it appears that she had a scotoma in the setting of a headache in March of this year, CT scan of the head was unremarkable, MRI of the brain was negative, symptoms resolved, diagnosed with a migraine and was given Reglan, Benadryl and Toradol, was feeling better and discharged.  Reviewed notes, labs and imaging from outside physicians, which showed ***  Review of Systems: Patient complains of symptoms per HPI as well as the following symptoms ***. Pertinent negatives and positives per HPI. All others negative.   Social History   Socioeconomic History  . Marital status: Married    Spouse name:  [name]  . Number of children: 3  . Years of education: College  . Highest education level: Not on file  Occupational History    Employer: OTHER  Tobacco Use  . Smoking status: Never Smoker  . Smokeless tobacco: Never Used  Vaping Use  . Vaping Use: Never used  Substance and Sexual Activity  . Alcohol use: No  . Drug use: No  . Sexual activity: Yes    Partners: Male    Birth control/protection: Pill  Other Topics Concern  . Not on file  Social History Narrative   Patient is married ([name]) and lives at home with her family   4 adults - 2 kids 50% of the time (1 son, 1 daughter). 2 older children (one age 98 from rape, then 24 year old- does not get to see- related to bipolar) Moved in with in laws to help them      Patient has 4 children (3 of her own and 1 step son).    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   Patient is right-handed.   Social Determinants of Health   Financial Resource Strain:   . Difficulty of Paying Living Expenses:   Food Insecurity:   . Worried About Charity fundraiser in the Last Year:   . [place] in the Last Year:  Transportation Needs:   . Film/video editor (Medical):   Marland Kitchen Lack of Transportation (Non-Medical):   Physical Activity:   . Days of Exercise per Week:   . Minutes of Exercise per Session:   Stress:   . Feeling of Stress :   Social Connections:   . Frequency of Communication with Friends and Family:   . Frequency of Social Gatherings with Friends and Family:   . Attends Religious Services:   . Active Member of Clubs or Organizations:   . Attends Archivist Meetings:   Marland Kitchen Marital Status:   Intimate Partner Violence:   . Fear of Current or Ex-Partner:   . Emotionally Abused:   Marland Kitchen Physically Abused:   . Sexually Abused:     Family History  Problem Relation Age of Onset  . Mental illness Mother         does not speak to regularly so does not know full history  . Other Father        medical issues, divorce related  . Other Brother        ptsd- fully discharged medically disabled before age 42  . Heart murmur Brother   . Cancer Paternal Grandfather        type unknown    Past Medical History:  Diagnosis Date  . Anxiety   . Arthritis   . Asthma   . Bipolar disorder with depression ([place]runo)    and PTSD  . Chicken pox   . Chronic migraine w/o aura, not intractable, w stat migr   . Depression   . Deviated septum   . DJD (degenerative joint disease)    Severe of the right knee-Dr Creighton-ortho and Dr Gean Birchwood, PA-preferred pain managment  . GERD (gastroesophageal reflux disease)   . Narcolepsy   . PTSD (post-traumatic stress disorder)   . PVC (premature ventricular contraction)   . Seasonal allergic rhinitis     Patient Active Problem List   Diagnosis Date Noted  . Erroneous encounter - disregard 04/08/2020  . Symptomatic PVCs 02/26/2020  . Atypical chest pain 10/06/2019  . Sinus tachycardia 08/27/2019  . Shortness of breath 08/27/2019  . Recurrent isolated sleep paralysis 07/02/2018  . Menstrual migraine without status migrainosus, not intractable 01/02/2018  . Chronic thumb pain, bilateral 11/23/2017  . Elevated TSH 05/07/2017  . Trigger thumb, left thumb 03/15/2017  . Chronic post-traumatic stress disorder (PTSD) 02/26/2017  . Sensorineural hearing loss (SNHL) of both ears 12/12/2016  . Asthma 10/27/2016  . GERD (gastroesophageal reflux disease) 10/27/2016  . Seasonal allergic rhinitis 05/02/2016  . Right ear pain 05/02/2016  . Dysfunction of both eustachian tubes 05/02/2016  . Intractable migraine with aura with status migrainosus 10/19/2015  . Narcolepsy cataplexy syndrome 03/06/2014  . Bipolar II disorder ([place]) 12/31/2013  . Chondromalacia of knee 03/07/2013    Past Surgical History:  Procedure Laterality Date  . knee surgery x5     08,11, 12, 13 x2- R  knee  . right foot surgery     x 3, arthritis/bone spurus. plate placed.   . WISDOM TOOTH EXTRACTION      Current Outpatient Medications  Medication Sig Dispense Refill  . albuterol (2.5 MG/3ML) 0.083% NEBU 3 mL, albuterol (5 MG/ML) 0.5% NEBU 0.5 mL Inhale into the lungs daily as needed.    Marland Kitchen albuterol (PROAIR HFA) 108 (90 Base) MCG/ACT inhaler Inhale 2 puffs into the lungs every 6 (six) hours as needed for wheezing or shortness of breath.    Marland Kitchen  ALPRAZolam (XANAX) 0.5 MG tablet Take 0.5 mg by mouth as needed for anxiety.     . Armodafinil 150 MG tablet Take 1 tablet (150 mg total) by mouth daily. 30 tablet 4  . azelastine (OPTIVAR) 0.05 % ophthalmic solution     . Biotin w/ Vitamins C & E (HAIR/SKIN/NAILS PO) Take by mouth.    . busPIRone (BUSPAR) 15 MG tablet Take 15 mg by mouth 3 (three) times daily.    . cyclobenzaprine (FLEXERIL) 10 MG tablet Take 10 mg by mouth as needed.    . desloratadine (CLARINEX) 5 MG tablet Take 5 mg by mouth daily.    Marland Kitchen EPIPEN 2-PAK 0.3 MG/0.3ML SOAJ injection as needed.    . famotidine (PEPCID) 20 MG tablet Take 20 mg by mouth 2 (two) times daily.    . fluticasone (FLONASE SENSIMIST) 27.5 MCG/SPRAY nasal spray Place 2 sprays into the nose daily.    Marland Kitchen gabapentin (NEURONTIN) 300 MG capsule Take 300 mg by mouth 3 (three) times daily.    Marland Kitchen ipratropium (ATROVENT) 0.06 % nasal spray Place 2 sprays into both nostrils 2 (two) times daily.    . IPRATROPIUM BROMIDE HFA IN Inhale into the lungs.    . lamoTRIgine (LAMICTAL) 200 MG tablet Take 400 mg by mouth at bedtime.    . levalbuterol (XOPENEX HFA) 45 MCG/ACT inhaler Inhale 1-2 puffs into the lungs every 6 (six) hours as needed for wheezing (if having issues with high heart rate on albuterol). 1 Inhaler 5  . loratadine (CLARITIN) 10 MG tablet Take 10 mg by mouth daily.    . metoprolol tartrate (LOPRESSOR) 25 MG tablet Take 25 mg by mouth 2 (two) times daily.    . Multiple Vitamins-Minerals (HAIR SKIN AND NAILS FORMULA  PO) Take 1 tablet by mouth daily.     Marland Kitchen olopatadine (PATANOL) 0.1 % ophthalmic solution     . Olopatadine HCl 0.6 % SOLN Place 1 spray into both nostrils in the morning and at bedtime.     . ondansetron (ZOFRAN-ODT) 4 MG disintegrating tablet DISSOLVE ONE TABLET BY MOUTH EVERY 8 HOURS AS NEEDED FOR NAUSEA/ VOMITING 18 tablet 0  . pantoprazole (PROTONIX) 40 MG tablet TAKE ONE TABLET BY MOUTH DAILY 30 tablet 0  . promethazine (PHENERGAN) 25 MG tablet TAKE ONE TABLET BY MOUTH EVERY 6 HOURS AS NEEDED FOR NAUSEA AND VOMITING 90 tablet 0  . pyridoxine (B-6) 100 MG tablet Take 100 mg by mouth in the morning and at bedtime.     . Sodium Oxybate 500 MG/ML SOLN Take 3.5gms at bedtime. Take 4.5gms 2 hours later. Total of 8gms per night. Dispense 329ml. 360 mL 5  . SUMAtriptan (IMITREX) 100 MG tablet TAKE 1/2 TO 1  TABLET BY MOUTH AT ONSET OF HEADACHE; MAY REPEAT ONE TABLET IN 2 HOURS IF NEEDED. (Patient taking differently: Take 50-100 mg by mouth every 2 (two) hours as needed for headache. ) 10 tablet 5  . SYMBICORT 160-4.5 MCG/ACT inhaler     . Thiamine HCl (VITAMIN B-1) 250 MG tablet Take 250 mg by mouth daily.    . vitamin B-12 (CYANOCOBALAMIN) 250 MCG tablet Take 250 mcg by mouth in the morning and at bedtime.      No current facility-administered medications for this visit.    Allergies as of 06/02/2020 - Review Complete 05/18/2020  Allergen Reaction Noted  . Klonopin [clonazepam] Nausea Only 12/19/2013  . Phenergan [promethazine hcl]  12/26/2011  . Promethazine Other (See Comments) 03/07/2013  . Promethazine  hcl Other (See Comments) 12/26/2011  . Topamax [topiramate] Nausea And Vomiting 12/19/2013    Vitals: LMP 05/16/2020  Last Weight:  Wt Readings from Last 1 Encounters:  05/18/20 156 lb 2 oz (70.8 kg)   Last Height:   Ht Readings from Last 1 Encounters:  05/18/20 5' 1.5" (1.562 m)     Physical exam: Exam: Gen: NAD, conversant, well nourised, obese, well groomed                      CV: RRR, no MRG. No Carotid Bruits. No peripheral edema, warm, nontender Eyes: Conjunctivae clear without exudates or hemorrhage  Neuro: Detailed Neurologic Exam  Speech:    Speech is normal; fluent and spontaneous with normal comprehension.  Cognition:    The patient is oriented to person, place, and time;     recent and remote memory intact;     language fluent;     normal attention, concentration,     fund of knowledge Cranial Nerves:    The pupils are equal, round, and reactive to light. The fundi are normal and spontaneous venous pulsations are present. Visual fields are full to finger confrontation. Extraocular movements are intact. Trigeminal sensation is intact and the muscles of mastication are normal. The face is symmetric. The palate elevates in the midline. Hearing intact. Voice is normal. Shoulder shrug is normal. The tongue has normal motion without fasciculations.   Coordination:    Normal finger to nose and heel to shin. Normal rapid alternating movements.   Gait:    Heel-toe and tandem gait are normal.   Motor Observation:    No asymmetry, no atrophy, and no involuntary movements noted. Tone:    Normal muscle tone.    Posture:    Posture is normal. normal erect    Strength:    Strength is V/V in the upper and lower limbs.      Sensation: intact to LT     Reflex Exam:  DTR's:    Deep tendon reflexes in the upper and lower extremities are normal bilaterally.   Toes:    The toes are downgoing bilaterally.   Clonus:    Clonus is absent.    Assessment/Plan:    No orders of the defined types were placed in this encounter.  No orders of the defined types were placed in this encounter.   Cc: Marin Olp, MD,  Marin Olp, MD  Sarina Ill, MD  [facility] [address] [place], [place] [address]  Phone [phone] Fax [phone]

## 2020-06-01 NOTE — Telephone Encounter (Signed)
Left message to call back  Patient scheduled to see Dr. Gardiner Rhyme today-monitor results not available at this time (processing since 7/6).  Advised to call back to reschedule appt.   Ok to double book per Dr. Gardiner Rhyme (NOT on Reader days).

## 2020-06-02 ENCOUNTER — Institutional Professional Consult (permissible substitution): Payer: BC Managed Care – PPO | Admitting: Neurology

## 2020-06-02 ENCOUNTER — Other Ambulatory Visit: Payer: Self-pay

## 2020-06-02 ENCOUNTER — Emergency Department (HOSPITAL_COMMUNITY)
Admission: EM | Admit: 2020-06-02 | Discharge: 2020-06-02 | Disposition: A | Discharge status: Home / Self Care | Payer: BC Managed Care – PPO | Attending: Emergency Medicine | Admitting: Emergency Medicine

## 2020-06-02 ENCOUNTER — Emergency Department (HOSPITAL_COMMUNITY): Payer: BC Managed Care – PPO

## 2020-06-02 ENCOUNTER — Encounter (HOSPITAL_COMMUNITY): Payer: Self-pay | Admitting: Pediatrics

## 2020-06-02 DIAGNOSIS — R002 Palpitations: Secondary | ICD-10-CM | POA: Diagnosis not present

## 2020-06-02 DIAGNOSIS — Z79899 Other long term (current) drug therapy: Secondary | ICD-10-CM | POA: Diagnosis not present

## 2020-06-02 DIAGNOSIS — J45909 Unspecified asthma, uncomplicated: Secondary | ICD-10-CM | POA: Diagnosis not present

## 2020-06-02 LAB — CBC
HCT: 43.2 % (ref 36.0–46.0)
Hemoglobin: 14.7 g/dL (ref 12.0–15.0)
MCH: 32.2 pg (ref 26.0–34.0)
MCHC: 34 g/dL (ref 30.0–36.0)
MCV: 94.7 fL (ref 80.0–100.0)
Platelets: 391 10*3/uL (ref 150–400)
RBC: 4.56 MIL/uL (ref 3.87–5.11)
RDW: 12.5 % (ref 11.5–15.5)
WBC: 10 10*3/uL (ref 4.0–10.5)
nRBC: 0 % (ref 0.0–0.2)

## 2020-06-02 LAB — I-STAT CHEM 8, ED
BUN: 6 mg/dL (ref 6–20)
Calcium, Ion: 1.13 mmol/L — ABNORMAL LOW (ref 1.15–1.40)
Chloride: 105 mmol/L (ref 98–111)
Creatinine, Ser: 0.6 mg/dL (ref 0.44–1.00)
Glucose, Bld: 89 mg/dL (ref 70–99)
HCT: 39 % (ref 36.0–46.0)
Hemoglobin: 13.3 g/dL (ref 12.0–15.0)
Potassium: 3.6 mmol/L (ref 3.5–5.1)
Sodium: 142 mmol/L (ref 135–145)
TCO2: 22 mmol/L (ref 22–32)

## 2020-06-02 LAB — I-STAT BETA HCG BLOOD, ED (MC, WL, AP ONLY): I-stat hCG, quantitative: <5 m[IU]/mL (ref <5)

## 2020-06-02 MED ORDER — SODIUM CHLORIDE 0.9% FLUSH: 3.0000 mL | Freq: Once | INTRAVENOUS | Status: DC

## 2020-06-02 NOTE — ED Triage Notes (Signed)
Patient arrived via EMS; c/o increasing frequency of palpitations since yesterday which patient has history of. Stated the episodes are accompanied by dizziness as well.

## 2020-06-02 NOTE — Discharge Instructions (Signed)
Please continue medicines prescribed to your for palpitations and anxiety Please call your Cardiologist's office for follow up Return to the ER if you are worsening

## 2020-06-02 NOTE — ED Notes (Signed)
Pt given water per PA 

## 2020-06-02 NOTE — ED Provider Notes (Signed)
[place] EMERGENCY DEPARTMENT Provider Note   CSN: [id number] Arrival date & time: 06/02/20  1315     History Chief Complaint  Patient presents with  . Palpitations    Debra Nelson is a 39 y.o. female who presents with palpitations. She states that she's been having palpitations for months but over the past 2 days it's been really bad, especially in the morning. She states yesterday it lasted all morning but then went away later in the day. Today the same thing happened. She was evaluated at the fire department who told her she was having bigeminy and trigeminy and she got worried so decided to come to the ED. She feels lightheaded at times and will feel like the palpitations take her breath away. She denies syncope, chest pain, true SOB. Her palpitations have been attributed to anxiety which is something she is working on but since she's missed several doses of Xanax and gabapentin since she's been here her palpitations have gotten worse again. She also hasn't been getting good sleep over the past couple nights and has had bad dreams. She has been trying to cut back on caffiene. She is seeing a Social worker. She also takes Metoprolol twice a day for palpitations.   HPI     Past Medical History:  Diagnosis Date  . Anxiety   . Arthritis   . Asthma   . Bipolar disorder with depression ([place])    and PTSD  . Chicken pox   . Chronic migraine w/o aura, not intractable, w stat migr   . Depression   . Deviated septum   . DJD (degenerative joint disease)    Severe of the right knee-Dr Creighton-ortho and Dr Gean Birchwood, PA-preferred pain managment  . GERD (gastroesophageal reflux disease)   . Narcolepsy   . PTSD (post-traumatic stress disorder)   . PVC (premature ventricular contraction)   . Seasonal allergic rhinitis     Patient Active Problem List   Diagnosis Date Noted  . Erroneous encounter - disregard 04/08/2020  . Symptomatic PVCs 02/26/2020  . Atypical  chest pain 10/06/2019  . Sinus tachycardia 08/27/2019  . Shortness of breath 08/27/2019  . Recurrent isolated sleep paralysis 07/02/2018  . Menstrual migraine without status migrainosus, not intractable 01/02/2018  . Chronic thumb pain, bilateral 11/23/2017  . Elevated TSH 05/07/2017  . Trigger thumb, left thumb 03/15/2017  . Chronic post-traumatic stress disorder (PTSD) 02/26/2017  . Sensorineural hearing loss (SNHL) of both ears 12/12/2016  . Asthma 10/27/2016  . GERD (gastroesophageal reflux disease) 10/27/2016  . Seasonal allergic rhinitis 05/02/2016  . Right ear pain 05/02/2016  . Dysfunction of both eustachian tubes 05/02/2016  . Intractable migraine with aura with status migrainosus 10/19/2015  . Narcolepsy cataplexy syndrome 03/06/2014  . Bipolar II disorder ([place]) 12/31/2013  . Chondromalacia of knee 03/07/2013    Past Surgical History:  Procedure Laterality Date  . knee surgery x5     08,11, 12, 13 x2- R knee  . right foot surgery     x 3, arthritis/bone spurus. plate placed.   . WISDOM TOOTH EXTRACTION       OB History   No obstetric history on file.     Family History  Problem Relation Age of Onset  . Mental illness Mother        does not speak to regularly so does not know full history  . Other Father        medical issues, divorce related  . Other Brother  ptsd- fully discharged medically disabled before age 7  . Heart murmur Brother   . Cancer Paternal Grandfather        type unknown    Social History   Tobacco Use  . Smoking status: Never Smoker  . Smokeless tobacco: Never Used  Vaping Use  . Vaping Use: Never used  Substance Use Topics  . Alcohol use: No  . Drug use: No    Home Medications Prior to Admission medications   Medication Sig Start Date End Date Taking? Authorizing Provider  albuterol (2.5 MG/3ML) 0.083% NEBU 3 mL, albuterol (5 MG/ML) 0.5% NEBU 0.5 mL Inhale into the lungs daily as needed.    [provider]    albuterol (PROAIR HFA) 108 (90 Base) MCG/ACT inhaler Inhale 2 puffs into the lungs every 6 (six) hours as needed for wheezing or shortness of breath.    [provider]  ALPRAZolam Duanne Moron) 0.5 MG tablet Take 0.5 mg by mouth as needed for anxiety.     [provider]  Armodafinil 150 MG tablet Take 1 tablet (150 mg total) by mouth daily. 07/10/19   Dohmeier, Asencion Partridge, MD  azelastine (OPTIVAR) 0.05 % ophthalmic solution  04/09/20   [provider]  Biotin w/ Vitamins C & E (HAIR/SKIN/NAILS PO) Take by mouth.    [provider]  busPIRone (BUSPAR) 15 MG tablet Take 15 mg by mouth 3 (three) times daily.    [provider]  cyclobenzaprine (FLEXERIL) 10 MG tablet Take 10 mg by mouth as needed. 02/20/20   [provider]  desloratadine (CLARINEX) 5 MG tablet Take 5 mg by mouth daily. 02/16/20   [provider]  EPIPEN 2-PAK 0.3 MG/0.3ML SOAJ injection as needed. 02/03/14   [provider]  famotidine (PEPCID) 20 MG tablet Take 20 mg by mouth 2 (two) times daily.    [provider]  fluticasone (FLONASE SENSIMIST) 27.5 MCG/SPRAY nasal spray Place 2 sprays into the nose daily.    [provider]  gabapentin (NEURONTIN) 300 MG capsule Take 300 mg by mouth 3 (three) times daily.    [provider]  ipratropium (ATROVENT) 0.06 % nasal spray Place 2 sprays into both nostrils 2 (two) times daily. 05/12/20   [provider]  IPRATROPIUM BROMIDE HFA IN Inhale into the lungs.    [provider]  lamoTRIgine (LAMICTAL) 200 MG tablet Take 400 mg by mouth at bedtime.    [provider]  levalbuterol Penne Lash HFA) 45 MCG/ACT inhaler Inhale 1-2 puffs into the lungs every 6 (six) hours as needed for wheezing (if having issues with high heart rate on albuterol). 01/01/20   Marin Olp, MD  loratadine (CLARITIN) 10 MG tablet Take 10 mg by mouth daily.    [provider]  metoprolol tartrate  (LOPRESSOR) 25 MG tablet Take 25 mg by mouth 2 (two) times daily.    [provider]  Multiple Vitamins-Minerals (HAIR SKIN AND NAILS FORMULA PO) Take 1 tablet by mouth daily.     [provider]  olopatadine (PATANOL) 0.1 % ophthalmic solution  04/07/20   [provider]  Olopatadine HCl 0.6 % SOLN Place 1 spray into both nostrils in the morning and at bedtime.  11/20/19   [provider]  ondansetron (ZOFRAN-ODT) 4 MG disintegrating tablet DISSOLVE ONE TABLET BY MOUTH EVERY 8 HOURS AS NEEDED FOR NAUSEA/ VOMITING 04/09/20   Marin Olp, MD  pantoprazole (PROTONIX) 40 MG tablet TAKE ONE TABLET BY MOUTH DAILY  05/17/20   Marin Olp, MD  promethazine (PHENERGAN) 25 MG tablet TAKE ONE TABLET BY MOUTH EVERY 6 HOURS AS NEEDED FOR NAUSEA AND VOMITING 02/16/20   Dohmeier, Asencion Partridge, MD  pyridoxine (B-6) 100 MG tablet Take 100 mg by mouth in the morning and at bedtime.     [provider]  Sodium Oxybate 500 MG/ML SOLN Take 3.5gms at bedtime. Take 4.5gms 2 hours later. Total of 8gms per night. Dispense 339ml. 05/12/20   Dohmeier, Asencion Partridge, MD  SUMAtriptan (IMITREX) 100 MG tablet TAKE 1/2 TO 1  TABLET BY MOUTH AT ONSET OF HEADACHE; MAY REPEAT ONE TABLET IN 2 HOURS IF NEEDED. Patient taking differently: Take 50-100 mg by mouth every 2 (two) hours as needed for headache.  09/22/19   Dohmeier, Asencion Partridge, MD  [facility] 160-4.5 MCG/ACT inhaler  04/08/20   [provider]  Thiamine HCl (VITAMIN B-1) 250 MG tablet Take 250 mg by mouth daily.    [provider]  vitamin B-12 (CYANOCOBALAMIN) 250 MCG tablet Take 250 mcg by mouth in the morning and at bedtime.     [provider]    Allergies    Klonopin [clonazepam], Phenergan [promethazine hcl], Promethazine, Promethazine hcl, and Topamax [topiramate]  Review of Systems   Review of Systems  Constitutional: Negative for chills and fever.  Respiratory: Negative for shortness of breath.     Cardiovascular: Positive for palpitations. Negative for chest pain.  Gastrointestinal: Negative for abdominal pain, nausea and vomiting.  Neurological: Positive for light-headedness. Negative for syncope.  Psychiatric/Behavioral: The patient is nervous/anxious.   All other systems reviewed and are negative.   Physical Exam Updated Vital Signs BP 112/70 (BP Location: Left Arm)   Pulse 79   Resp 18   Ht 5\' 1"  (1.549 m)   Wt 70.8 kg   LMP 05/16/2020   SpO2 100%   BMI 29.48 kg/m   Physical Exam Vitals and nursing note reviewed.  Constitutional:      General: She is not in acute distress.    Appearance: Normal appearance. She is well-developed. She is not ill-appearing.  HENT:     Head: Normocephalic and atraumatic.  Eyes:     General: No scleral icterus.       Right eye: No discharge.        Left eye: No discharge.     Conjunctiva/sclera: Conjunctivae normal.     Pupils: Pupils are equal, round, and reactive to light.  Cardiovascular:     Rate and Rhythm: Normal rate and regular rhythm.     Comments: Occasional PVC but mostly regular rate and rhythm Pulmonary:     Effort: Pulmonary effort is normal. No respiratory distress.     Breath sounds: Normal breath sounds.  Abdominal:     General: There is no distension.     Palpations: Abdomen is soft.  Musculoskeletal:     Cervical back: Normal range of motion.     Right lower leg: No edema.     Left lower leg: No edema.  Skin:    General: Skin is warm and dry.  Neurological:     Mental Status: She is alert and oriented to person, place, and time.  Psychiatric:        Behavior: Behavior normal.     ED Results / Procedures / Treatments   Labs (all labs ordered are listed, but only abnormal results are displayed) Labs Reviewed  I-STAT CHEM 8, ED - Abnormal; Notable for the following components:  Result Value   Calcium, Ion 1.13 (*)    All other components within normal limits  CBC  BASIC METABOLIC PANEL   I-STAT BETA HCG BLOOD, ED ([facility], [facility], [facility] ONLY)    EKG None  Radiology DG Chest 2 View  Result Date: 06/02/2020 CLINICAL DATA:  Palpitations. Additional provided: Palpitations, shortness of breath, history of asthma EXAM: CHEST - 2 VIEW COMPARISON:  Prior chest radiographs 01/31/2020 and earlier FINDINGS: Heart size within normal limits. There is no appreciable airspace consolidation. No evidence of pleural effusion or pneumothorax. No acute bony abnormality identified. IMPRESSION: No evidence of active cardiopulmonary disease. Electronically Signed   By: Kellie Simmering DO   On: 06/02/2020 13:48    Procedures Procedures (including critical care time)  Medications Ordered in ED Medications  sodium chloride flush (NS) 0.9 % injection 3 mL (has no administration in time range)    ED Course  I have reviewed the triage vital signs and the nursing notes.  Pertinent labs & imaging results that were available during my care of the patient were reviewed by me and considered in my medical decision making (see chart for details).  39 year old female presents with worsening palpitations. Her vitals are normal here. EKG is SR. She waited about 5 hours to be seen and on exam she does seem to have occasional skipped beats but she is hemodynamically stable. Labs are reassuring. CXR is normal. She was encouraged to f/u with Cardiology for medication management and to continue to try and manage her stress and anxiety.   MDM Rules/Calculators/A&P                           Final Clinical Impression(s) / ED Diagnoses Final diagnoses:  Palpitations    Rx / DC Orders ED Discharge Orders    None       Recardo Evangelist, PA-C 06/02/20 1951    Daleen Bo, MD 06/02/20 2206

## 2020-06-02 NOTE — ED Notes (Signed)
Patient verbalizes understanding of discharge instructions. Opportunity for questioning and answers were provided. Armband removed by staff, pt discharged from ED.  

## 2020-06-03 MED ORDER — METOPROLOL TARTRATE 25 MG PO TABS: 37.5000 mg | ORAL_TABLET | Freq: Two times a day (BID) | ORAL | 3 refills | Status: DC

## 2020-06-13 ENCOUNTER — Other Ambulatory Visit: Payer: Self-pay | Admitting: Neurology

## 2020-06-13 DIAGNOSIS — G4753 Recurrent isolated sleep paralysis: Secondary | ICD-10-CM

## 2020-06-13 DIAGNOSIS — G47411 Narcolepsy with cataplexy: Secondary | ICD-10-CM

## 2020-06-14 NOTE — Progress Notes (Signed)
Cardiology Office Note:    Date:  06/15/2020   ID:  Debra, Nelson [birth year], MRN [id number]  PCP:  Marin Olp, MD  Cardiologist:  Donato Heinz, MD  Electrophysiologist:  None   Referring MD: Marin Olp, MD   Chief Complaint  Patient presents with  . Palpitations    History of Present Illness:    Debra Nelson is a 39 y.o. female with a hx of bipolar disorder, PTSD, asthma, GERD, migraines, narcolepsy, anxiety who presents for follow-up.  She was referred by Dr. Yong Channel for evaluation of palpitations, initially seen on 04/16/2020.  She had been following with Dr. Virgina Jock for cardiac evaluation.  Work-up has included ETT with low exercise capacity (4.7 METS) but no ischemic changes on EKG.  Event monitor showed occasional PVCs.  TTE on 08/24/2019 showed LVEF 55%, no significant valvular disease.  She recently had an episode of syncope and presented to the ED for evaluation.  Work-up was unremarkable, was thought to be complex migraine.  She was started on metoprolol 25 mg twice daily for symptomatic PVCs.  Previously had been on propranol and diltiazem.  Reported worsening palpitations and metoprolol dose was increased to 37.5 mg twice daily.  Zio patch x3 days on 06/08/2020 showed no significant arrhythmias and low PVC burden (less than 1% of beats).  Since last clinic visit, she reports that she continues to have palpitations.  Reports that she was not having symptoms as much when wearing the monitor.  States that she feels the Lopressor is working for her but feels like it wears off and she has palpitations in the hours preceding each dose.  States that she has decreased her caffeine intake, was drinking 3 Cokes per day but now essentially not drinking any.     Past Medical History:  Diagnosis Date  . Anxiety   . Arthritis   . Asthma   . Bipolar disorder with depression ([place])    and PTSD  . Chicken pox   . Chronic migraine w/o aura, not intractable,  w stat migr   . Depression   . Deviated septum   . DJD (degenerative joint disease)    Severe of the right knee-Dr Creighton-ortho and Dr Gean Birchwood, PA-preferred pain managment  . GERD (gastroesophageal reflux disease)   . Narcolepsy   . PTSD (post-traumatic stress disorder)   . PVC (premature ventricular contraction)   . Seasonal allergic rhinitis     Past Surgical History:  Procedure Laterality Date  . knee surgery x5     08,11, 12, 13 x2- R knee  . right foot surgery     x 3, arthritis/bone spurus. plate placed.   . WISDOM TOOTH EXTRACTION      Current Medications: Current Meds  Medication Sig  . albuterol (2.5 MG/3ML) 0.083% NEBU 3 mL, albuterol (5 MG/ML) 0.5% NEBU 0.5 mL Inhale into the lungs daily as needed.  Marland Kitchen albuterol (PROAIR HFA) 108 (90 Base) MCG/ACT inhaler Inhale 2 puffs into the lungs every 6 (six) hours as needed for wheezing or shortness of breath.  . ALPRAZolam (XANAX) 0.5 MG tablet Take 0.5 mg by mouth as needed for anxiety.   . Armodafinil 150 MG tablet Take 1 tablet (150 mg total) by mouth daily.  Marland Kitchen azelastine (OPTIVAR) 0.05 % ophthalmic solution   . busPIRone (BUSPAR) 15 MG tablet Take 15 mg by mouth in the morning and at bedtime.   . cyclobenzaprine (FLEXERIL) 10 MG tablet Take 10 mg by mouth  as needed.  . desloratadine (CLARINEX) 5 MG tablet Take 5 mg by mouth daily.  Marland Kitchen EPIPEN 2-PAK 0.3 MG/0.3ML SOAJ injection as needed.  . famotidine (PEPCID) 20 MG tablet Take 20 mg by mouth 2 (two) times daily.  . fluticasone (FLONASE SENSIMIST) 27.5 MCG/SPRAY nasal spray Place 2 sprays into the nose daily.  Marland Kitchen gabapentin (NEURONTIN) 300 MG capsule Take 600 mg by mouth in the morning and at bedtime. Patient takes 600 mg twice a day  . ipratropium (ATROVENT) 0.06 % nasal spray Place 2 sprays into both nostrils 2 (two) times daily.  . IPRATROPIUM BROMIDE HFA IN Inhale into the lungs.  . lamoTRIgine (LAMICTAL) 200 MG tablet Take 400 mg by mouth at bedtime.  .  levalbuterol (XOPENEX HFA) 45 MCG/ACT inhaler Inhale 1-2 puffs into the lungs every 6 (six) hours as needed for wheezing (if having issues with high heart rate on albuterol).  Marland Kitchen loratadine (CLARITIN) 10 MG tablet Take 10 mg by mouth daily.  . Multiple Vitamins-Minerals (HAIR SKIN AND NAILS FORMULA PO) Take 1 tablet by mouth daily.   Marland Kitchen olopatadine (PATANOL) 0.1 % ophthalmic solution   . Olopatadine HCl 0.6 % SOLN Place 1 spray into both nostrils in the morning and at bedtime.   . ondansetron (ZOFRAN-ODT) 4 MG disintegrating tablet DISSOLVE ONE TABLET BY MOUTH EVERY 8 HOURS AS NEEDED FOR NAUSEA/ VOMITING  . pantoprazole (PROTONIX) 40 MG tablet TAKE ONE TABLET BY MOUTH DAILY  . promethazine (PHENERGAN) 25 MG tablet TAKE ONE TABLET BY MOUTH EVERY 6 HOURS AS NEEDED FOR NAUSEA AND VOMITING  . pyridoxine (B-6) 100 MG tablet Take 100 mg by mouth in the morning and at bedtime.   . Sodium Oxybate 500 MG/ML SOLN Take 3.5gms at bedtime. Take 4.5gms 2 hours later. Total of 8gms per night. Dispense 370ml.  . SUMAtriptan (IMITREX) 100 MG tablet TAKE 1/2 TO 1  TABLET BY MOUTH AT ONSET OF HEADACHE; MAY REPEAT ONE TABLET IN 2 HOURS IF NEEDED. (Patient taking differently: Take 50-100 mg by mouth every 2 (two) hours as needed for headache. )  . SYMBICORT 160-4.5 MCG/ACT inhaler   . vitamin B-12 (CYANOCOBALAMIN) 250 MCG tablet Take 250 mcg by mouth in the morning and at bedtime.   Marland Kitchen zonisamide (ZONEGRAN) 50 MG capsule Take 50 mg by mouth at bedtime.  . [DISCONTINUED] metoprolol tartrate (LOPRESSOR) 25 MG tablet Take 1.5 tablets (37.5 mg total) by mouth 2 (two) times daily.     Allergies:   Klonopin [clonazepam], Phenergan [promethazine hcl], Promethazine, Promethazine hcl, and Topamax [topiramate]   Social History   Socioeconomic History  . Marital status: Married    Spouse name: [name]  . Number of children: 3  . Years of education: College  . Highest education level: Not on file  Occupational History     Employer: OTHER  Tobacco Use  . Smoking status: Never Smoker  . Smokeless tobacco: Never Used  Vaping Use  . Vaping Use: Never used  Substance and Sexual Activity  . Alcohol use: No  . Drug use: No  . Sexual activity: Yes    Partners: Male    Birth control/protection: Pill  Other Topics Concern  . Not on file  Social History Narrative   Patient is married ([name]) and lives at home with her family   4 adults - 2 kids 50% of the time (1 son, 1 daughter). 2 older children (one age 77 from rape, then 37 year old- does not get to see- related to  bipolar) Moved in with in laws to help them      Patient has 4 children (3 of her own and 1 step son).    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   Patient is right-handed.   Social Determinants of Health   Financial Resource Strain:   . Difficulty of Paying Living Expenses:   Food Insecurity:   . Worried About Charity fundraiser in the Last Year:   . Arboriculturist in the Last Year:   Transportation Needs:   . Film/video editor (Medical):   Marland Kitchen Lack of Transportation (Non-Medical):   Physical Activity:   . Days of Exercise per Week:   . Minutes of Exercise per Session:   Stress:   . Feeling of Stress :   Social Connections:   . Frequency of Communication with Friends and Family:   . Frequency of Social Gatherings with Friends and Family:   . Attends Religious Services:   . Active Member of Clubs or Organizations:   . Attends Archivist Meetings:   Marland Kitchen Marital Status:      Family History: The patient's family history includes Cancer in her paternal grandfather; Heart murmur in her brother; Mental illness in her mother; Other in her brother and father.  ROS:   Please see the history of present illness.     All other systems reviewed and are negative.  EKGs/Labs/Other Studies Reviewed:    The following studies  were reviewed today:  EKG:  EKG is ordered today.  The ekg ordered today demonstrates sinus rhythm, rate 75, no PVCs  Recent Labs: 04/05/2020: ALT 18 04/16/2020: Magnesium 2.3; TSH 1.020 06/02/2020: BUN 6; Creatinine, Ser 0.60; Hemoglobin 13.3; Platelets 391; Potassium 3.6; Sodium 142  Recent Lipid Panel    Component Value Date/Time   CHOL 170 01/30/2018 0915   TRIG 91 01/30/2018 0915   HDL 57 01/30/2018 0915   LDLCALC 95 01/30/2018 0915    Physical Exam:    VS:  BP 116/82   Pulse 75   Ht 5' 1.5" (1.562 m)   Wt 161 lb 9.6 oz (73.3 kg)   LMP 06/08/2020   SpO2 98%   BMI 30.04 kg/m     Wt Readings from Last 3 Encounters:  06/15/20 161 lb 9.6 oz (73.3 kg)  06/02/20 156 lb (70.8 kg)  05/18/20 156 lb 2 oz (70.8 kg)     GEN:  Well nourished, well developed in no acute distress HEENT: Normal NECK: No JVD CARDIAC: RRR, no murmurs, rubs, gallops RESPIRATORY:  Clear to auscultation without rales, wheezing or rhonchi  ABDOMEN: Soft, non-tender, non-distended MUSCULOSKELETAL:  No edema; No deformity  SKIN: Warm and dry NEUROLOGIC:  Alert and oriented x 3 PSYCHIATRIC:  Normal affect   ASSESSMENT:    1. Symptomatic PVCs   2. PVC's (premature ventricular contractions)    PLAN:    Palpitations/PVCs: work-up with with Dr. Virgina Jock has included ETT with low exercise capacity (4.7 METS) but no ischemic changes on EKG.  Event monitor showed occasional PVCs.  TTE on 08/24/2019 showed LVEF 55%, no significant valvular disease.  Symptoms likely symptomatic PVCs, she was started on metoprolol 25 mg twice daily, has been titrated to 37.5 mg twice daily.  Zio patch x3 days on 06/08/2020 showed no significant arrhythmias and low PVC burden (less than 1%  of beats).  Symptoms seem to correspond to PVCs, improved on Lopressor but feels like it wears off and she has palpitations in the hours preceding each dose.  Will change to Toprol-XL 25 mg twice daily  RTC in 3 months  Medication  Adjustments/Labs and Tests Ordered: Current medicines are reviewed at length with the patient today.  Concerns regarding medicines are outlined above.  Orders Placed This Encounter  Procedures  . EKG 12-Lead   Meds ordered this encounter  Medications  . metoprolol succinate (TOPROL XL) 25 MG 24 hr tablet    Sig: Take 1 tablet (25 mg total) by mouth 2 (two) times daily.    Dispense:  60 tablet    Refill:  3    Patient Instructions  Medication Instructions:  Stop Lopressor Start Toprol XL 2 times daily *If you need a refill on your cardiac medications before your next appointment, please call your pharmacy*   Lab Work: None Ordered If you have labs (blood work) drawn today and your tests are completely normal, you will receive your results only by: Marland Kitchen MyChart Message (if you have MyChart) OR . A paper copy in the mail If you have any lab test that is abnormal or we need to change your treatment, we will call you to review the results.   Testing/Procedures: None Ordered   Follow-Up: At [facility], you and your health needs are our priority.  As part of our continuing mission to provide you with exceptional heart care, we have created designated Provider Care Teams.  These Care Teams include your primary Cardiologist (physician) and Advanced Practice Providers (APPs -  Physician Assistants and Nurse Practitioners) who all work together to provide you with the care you need, when you need it.  We recommend signing up for the patient portal called "MyChart".  Sign up information is provided on this After Visit Summary.  MyChart is used to connect with patients for Virtual Visits (Telemedicine).  Patients are able to view lab/test results, encounter notes, upcoming appointments, etc.  Non-urgent messages can be sent to your provider as well.   To learn more about what you can do with MyChart, go to [email or web address].    Your next appointment:   3 month(s)  The format for  your next appointment:   In Person  Provider:   Oswaldo Milian, MD   Other Instructions     Signed, Donato Heinz, MD  06/15/2020 11:01 PM    [place]

## 2020-06-15 ENCOUNTER — Ambulatory Visit (INDEPENDENT_AMBULATORY_CARE_PROVIDER_SITE_OTHER): Payer: BC Managed Care – PPO | Admitting: Cardiology

## 2020-06-15 ENCOUNTER — Encounter: Payer: Self-pay | Admitting: Cardiology

## 2020-06-15 ENCOUNTER — Other Ambulatory Visit: Payer: Self-pay

## 2020-06-15 VITALS — BP 116/82 | HR 75 | Ht 61.5 in | Wt 161.6 lb

## 2020-06-15 DIAGNOSIS — I493 Ventricular premature depolarization: Secondary | ICD-10-CM | POA: Diagnosis not present

## 2020-06-15 MED ORDER — METOPROLOL SUCCINATE ER 25 MG PO TB24: 25.0000 mg | ORAL_TABLET | Freq: Two times a day (BID) | ORAL | 3 refills | Status: DC

## 2020-06-15 NOTE — Patient Instructions (Signed)
Medication Instructions:  Stop Lopressor Start Toprol XL 2 times daily *If you need a refill on your cardiac medications before your next appointment, please call your pharmacy*   Lab Work: None Ordered If you have labs (blood work) drawn today and your tests are completely normal, you will receive your results only by: Marland Kitchen MyChart Message (if you have MyChart) OR . A paper copy in the mail If you have any lab test that is abnormal or we need to change your treatment, we will call you to review the results.   Testing/Procedures: None Ordered   Follow-Up: At [facility], you and your health needs are our priority.  As part of our continuing mission to provide you with exceptional heart care, we have created designated Provider Care Teams.  These Care Teams include your primary Cardiologist (physician) and Advanced Practice Providers (APPs -  Physician Assistants and Nurse Practitioners) who all work together to provide you with the care you need, when you need it.  We recommend signing up for the patient portal called "MyChart".  Sign up information is provided on this After Visit Summary.  MyChart is used to connect with patients for Virtual Visits (Telemedicine).  Patients are able to view lab/test results, encounter notes, upcoming appointments, etc.  Non-urgent messages can be sent to your provider as well.   To learn more about what you can do with MyChart, go to [email or web address].    Your next appointment:   3 month(s)  The format for your next appointment:   In Person  Provider:   Oswaldo Milian, MD   Other Instructions

## 2020-06-17 NOTE — Progress Notes (Signed)
PA submitted for Armodafinil 150mg  on Cover my Meds. There is a 24/72hr turn around. PA case ID: [id number] Key: [id number] RX: [id number] Status: PENDING

## 2020-06-20 HISTORY — PX: NASAL TURBINATE REDUCTION: SHX2072

## 2020-06-24 NOTE — Progress Notes (Signed)
Submitted PA for Armodafinil on Cover my Meds. KeyReece Agar PA Case ID:  [id number]  Rx #: [id number]   Approval dates:  06/17/2020 - 06/17/2021

## 2020-06-25 ENCOUNTER — Telehealth (INDEPENDENT_AMBULATORY_CARE_PROVIDER_SITE_OTHER): Payer: BC Managed Care – PPO | Admitting: Family Medicine

## 2020-06-25 DIAGNOSIS — R21 Rash and other nonspecific skin eruption: Secondary | ICD-10-CM

## 2020-06-25 DIAGNOSIS — J301 Allergic rhinitis due to pollen: Secondary | ICD-10-CM

## 2020-06-25 MED ORDER — METRONIDAZOLE 0.75 % EX GEL: 1.0000 "application " | Freq: Two times a day (BID) | CUTANEOUS | 0 refills | Status: DC

## 2020-06-25 NOTE — Progress Notes (Signed)
   Lurlene Ronda Speelman is a 39 y.o. female who presents today for a virtual office visit.  Assessment/Plan:  New/Acute Problems: . Discussed limitations of virtual visit.  History and appearance via WebCam consistent with rosacea no advised patient it was difficult to see clearly due to low resolution of cancer.  She also has an extensive allergic history-doubt may be having component of allergic/contact dermatitis though no new known exposures recently.  Will start topical metronidazole gel for the next few weeks.  If no improvement patient will come into the office for further evaluation.  Chronic Problems Addressed Today: Allergic Rhinitis Symptoms are currently manageable though worsened.  She is already on extensive allergic rhinitis medications including nasal sprays and several oral medications.  Discussed with patient only other option at this point would be prednisone.  Patient does not think her symptoms are severe enough to warrant this at this point.  Hopefully should have continue improvement over the next 2 weeks now she is back from vacation.  She will let us know if symptoms do not continue to improve   Subjective:  HPI:  Patient here with facial rash for the past month or so.  Comes and goes.  Located on cheeks, chin, and chest.  Worse with sun exposure.  She has had some raised temples to the area.  No specific treatments tried.  Lesions are not itchy.  Not painful.  No specific treatments tried.  No known new exposures.  She is also had flareup of allergies over the last week or so.  She was recently on vacation in [place] where she slept on a down pillow.  She thinks that this may have contributed.  She has had a little bit more of a sore throat and cough since then.       Objective/Observations  Physical Exam: Gen: NAD, resting comfortably Skin: Erythematous rash involving chin and bilateral cheeks with a few scattered pustules. Pulm: Normal work of  breathing Neuro: Grossly normal, moves all extremities Psych: Normal affect and thought content  Virtual Visit via Video   I connected with [name] on 06/25/20 at  1:00 PM EDT by a video enabled telemedicine application and verified that I am speaking with the correct person using two identifiers. The limitations of evaluation and management by telemedicine and the availability of in person appointments were discussed. The patient expressed understanding and agreed to proceed.   Patient location: Home Provider location: [place] participating in the virtual visit: Myself and Patient     Algis Greenhouse. Jerline Pain, MD 06/25/2020 9:08 AM

## 2020-06-29 ENCOUNTER — Other Ambulatory Visit: Payer: Self-pay | Admitting: Family Medicine

## 2020-06-29 ENCOUNTER — Encounter: Payer: Self-pay | Admitting: Neurology

## 2020-06-29 DIAGNOSIS — K219 Gastro-esophageal reflux disease without esophagitis: Secondary | ICD-10-CM

## 2020-06-29 NOTE — Telephone Encounter (Signed)
Yes, nuvigil can be taken with xanax-, CD

## 2020-07-07 ENCOUNTER — Encounter: Payer: BC Managed Care – PPO | Admitting: Gastroenterology

## 2020-07-09 ENCOUNTER — Encounter: Payer: Self-pay | Admitting: Family Medicine

## 2020-07-12 ENCOUNTER — Telehealth: Payer: Self-pay

## 2020-07-12 NOTE — Telephone Encounter (Signed)
Pt called back and has been scheduled for 09-02 with an 11:00 check in

## 2020-07-12 NOTE — Telephone Encounter (Signed)
Pt left a VM asking for a call back to discuss rescheduling her appt on 07/14/2020.

## 2020-07-12 NOTE — Telephone Encounter (Signed)
I called pt to discuss. No answer, left a message asking her to call us back. If pt calls back please assist her in scheduling an appt.

## 2020-07-14 ENCOUNTER — Ambulatory Visit: Payer: BC Managed Care – PPO | Admitting: Adult Health

## 2020-07-21 ENCOUNTER — Telehealth: Payer: Self-pay | Admitting: Adult Health

## 2020-07-21 NOTE — Telephone Encounter (Signed)
Noted  

## 2020-07-21 NOTE — Telephone Encounter (Signed)
Made copy gave to check in to scan in.

## 2020-07-21 NOTE — Telephone Encounter (Signed)
Pr has called to provide her new insurance info [facility] Choice Member QB:[id number] [place] This is FYI

## 2020-07-21 NOTE — Telephone Encounter (Signed)
..   Pt understands that although there may be some limitations with this type of visit, we will take all precautions to reduce any security or privacy concerns.  Pt understands that this will be treated like an in office visit and we will file with pt's insurance, and there may be a patient responsible charge related to this service. ? ?

## 2020-07-22 ENCOUNTER — Telehealth (INDEPENDENT_AMBULATORY_CARE_PROVIDER_SITE_OTHER): Payer: BLUE CROSS/BLUE SHIELD | Admitting: Adult Health

## 2020-07-22 DIAGNOSIS — G47419 Narcolepsy without cataplexy: Secondary | ICD-10-CM

## 2020-07-22 NOTE — Progress Notes (Signed)
PATIENT: Debra Nelson DOB: [birth year]  REASON FOR VISIT: follow up HISTORY FROM: patient  Virtual Visit via Video Note  I connected with Debra Nelson on 07/22/20 at 11:30 AM EDT by a video enabled telemedicine application located remotely at [facility] and verified that I am speaking with the correct person using two identifiers who was located at their own home.   I discussed the limitations of evaluation and management by telemedicine and the availability of in person appointments. The patient expressed understanding and agreed to proceed.   PATIENT: Debra Nelson DOB: [birth year]  REASON FOR VISIT: follow up HISTORY FROM: patient  HISTORY OF PRESENT ILLNESS: Today 07/22/20:  Debra Nelson is a 39 year old female with a history of narcolepsy with cataplexy.  She returns today for follow-up.  She reports that Xyrem and armodafinil is working well for her.  She denies any cataplectic events.  She still has a history of depression but does not feel that it is any worse.  She does have a psychiatrist.  Denies any suicidal thoughts.  She does report that her PCP thought that she may have fibromyalgia? (I advised a referral to rheumatology by her PCP may be needed.)   Patient has a consultation with Dr. Jaynee Eagles later this month for her headaches.  HISTORY 01/12/20:  Debra Nelson is a 39 year old female with a history of narcolepsy with cataplexy.  She returns today for follow-up.  She continues on Xyrem and a new vigil.  She states that her depression has been worse with Covid but denies any suicidal thoughts.  She does see a psychiatrist.  She is currently not working.  Denies any trouble driving.  Denies any cataplectic events.  Reports that she has had an increase in headaches in the last month.  The location varies.  She states that it can sometimes be sharp or dull.  She does have photophobia and phonophobia on occasion.  Denies nausea and vomiting.  She  does take Tylenol and reports that it works intermittently.  She also has sumatriptan.  In the past she has been on Zonegran.  REVIEW OF SYSTEMS: Out of a complete 14 system review of symptoms, the patient complains only of the following symptoms, and all other reviewed systems are negative.  ALLERGIES: Allergies  Allergen Reactions  . Klonopin [Clonazepam] Nausea Only  . Phenergan [Promethazine Hcl]     IV causes muscle tics, may take oral, or give benadryl prior to IV  . Promethazine Other (See Comments)    She cannot take IV phenergan unless this it is given with Benadryl.  . Promethazine Hcl Other (See Comments)    IV causes muscle tics, may take oral, or give benadryl prior to IV  . Topamax [Topiramate] Nausea And Vomiting    confusion    HOME MEDICATIONS: Outpatient Medications Prior to Visit  Medication Sig Dispense Refill  . albuterol (2.5 MG/3ML) 0.083% NEBU 3 mL, albuterol (5 MG/ML) 0.5% NEBU 0.5 mL Inhale into the lungs daily as needed.    Marland Kitchen albuterol (PROAIR HFA) 108 (90 Base) MCG/ACT inhaler Inhale 2 puffs into the lungs every 6 (six) hours as needed for wheezing or shortness of breath.    . ALPRAZolam (XANAX) 0.5 MG tablet Take 0.5 mg by mouth as needed for anxiety.     . Armodafinil 150 MG tablet TAKE ONE TABLET BY MOUTH DAILY 90 tablet 0  . azelastine (OPTIVAR) 0.05 % ophthalmic solution     . busPIRone (  BUSPAR) 15 MG tablet Take 15 mg by mouth in the morning and at bedtime.     . cyclobenzaprine (FLEXERIL) 10 MG tablet Take 10 mg by mouth as needed.    . desloratadine (CLARINEX) 5 MG tablet Take 5 mg by mouth daily.    Marland Kitchen EPIPEN 2-PAK 0.3 MG/0.3ML SOAJ injection as needed.    . famotidine (PEPCID) 20 MG tablet Take 20 mg by mouth 2 (two) times daily.    . fluticasone (FLONASE SENSIMIST) 27.5 MCG/SPRAY nasal spray Place 2 sprays into the nose daily.    Marland Kitchen gabapentin (NEURONTIN) 300 MG capsule Take 600 mg by mouth in the morning and at bedtime. Patient takes 600 mg twice  a day    . ipratropium (ATROVENT) 0.06 % nasal spray Place 2 sprays into both nostrils 2 (two) times daily.    . IPRATROPIUM BROMIDE HFA IN Inhale into the lungs.    . lamoTRIgine (LAMICTAL) 200 MG tablet Take 400 mg by mouth at bedtime.    . levalbuterol (XOPENEX HFA) 45 MCG/ACT inhaler Inhale 1-2 puffs into the lungs every 6 (six) hours as needed for wheezing (if having issues with high heart rate on albuterol). 1 Inhaler 5  . loratadine (CLARITIN) 10 MG tablet Take 10 mg by mouth daily.    . metoprolol succinate (TOPROL XL) 25 MG 24 hr tablet Take 1 tablet (25 mg total) by mouth 2 (two) times daily. 60 tablet 3  . metroNIDAZOLE (METROGEL) 0.75 % gel Apply 1 application topically 2 (two) times daily. 45 g 0  . Multiple Vitamins-Minerals (HAIR SKIN AND NAILS FORMULA PO) Take 1 tablet by mouth daily.     Marland Kitchen olopatadine (PATANOL) 0.1 % ophthalmic solution     . Olopatadine HCl 0.6 % SOLN Place 1 spray into both nostrils in the morning and at bedtime.     . ondansetron (ZOFRAN-ODT) 4 MG disintegrating tablet DISSOLVE ONE TABLET BY MOUTH EVERY 8 HOURS AS NEEDED FOR NAUSEA/ VOMITING 18 tablet 0  . pantoprazole (PROTONIX) 40 MG tablet TAKE ONE TABLET BY MOUTH DAILY 30 tablet 0  . promethazine (PHENERGAN) 25 MG tablet TAKE ONE TABLET BY MOUTH EVERY 6 HOURS AS NEEDED FOR NAUSEA/ VOMITING 90 tablet 0  . pyridoxine (B-6) 100 MG tablet Take 100 mg by mouth in the morning and at bedtime.     . Sodium Oxybate 500 MG/ML SOLN Take 3.5gms at bedtime. Take 4.5gms 2 hours later. Total of 8gms per night. Dispense 38ml. 360 mL 5  . SUMAtriptan (IMITREX) 100 MG tablet TAKE 1/2 TO 1  TABLET BY MOUTH AT ONSET OF HEADACHE; MAY REPEAT ONE TABLET IN 2 HOURS IF NEEDED. (Patient taking differently: Take 50-100 mg by mouth every 2 (two) hours as needed for headache. ) 10 tablet 5  . SYMBICORT 160-4.5 MCG/ACT inhaler     . vitamin B-12 (CYANOCOBALAMIN) 250 MCG tablet Take 250 mcg by mouth in the morning and at bedtime.     Marland Kitchen  zonisamide (ZONEGRAN) 50 MG capsule Take 50 mg by mouth at bedtime.     No facility-administered medications prior to visit.    PAST MEDICAL HISTORY: Past Medical History:  Diagnosis Date  . Anxiety   . Arthritis   . Asthma   . Bipolar disorder with depression ([place])    and PTSD  . Chicken pox   . Chronic migraine w/o aura, not intractable, w stat migr   . Depression   . Deviated septum   . DJD (degenerative joint disease)  Severe of the right knee-Dr Creighton-ortho and Dr Gean Birchwood, PA-preferred pain managment  . GERD (gastroesophageal reflux disease)   . Narcolepsy   . PTSD (post-traumatic stress disorder)   . PVC (premature ventricular contraction)   . Seasonal allergic rhinitis     PAST SURGICAL HISTORY: Past Surgical History:  Procedure Laterality Date  . knee surgery x5     08,11, 12, 13 x2- R knee  . right foot surgery     x 3, arthritis/bone spurus. plate placed.   . WISDOM TOOTH EXTRACTION      FAMILY HISTORY: Family History  Problem Relation Age of Onset  . Mental illness Mother        does not speak to regularly so does not know full history  . Other Father        medical issues, divorce related  . Other Brother        ptsd- fully discharged medically disabled before age 49  . Heart murmur Brother   . Cancer Paternal Grandfather        type unknown    SOCIAL HISTORY: Social History   Socioeconomic History  . Marital status: Married    Spouse name: [name]  . Number of children: 3  . Years of education: College  . Highest education level: Not on file  Occupational History    Employer: OTHER  Tobacco Use  . Smoking status: Never Smoker  . Smokeless tobacco: Never Used  Vaping Use  . Vaping Use: Never used  Substance and Sexual Activity  . Alcohol use: No  . Drug use: No  . Sexual activity: Yes    Partners: Male    Birth control/protection: Pill  Other Topics Concern  . Not on file  Social History Narrative   Patient is married  ([name]) and lives at home with her family   4 adults - 2 kids 50% of the time (1 son, 1 daughter). 2 older children (one age 84 from rape, then 52 year old- does not get to see- related to bipolar) Moved in with in laws to help them      Patient has 4 children (3 of her own and 1 step son).    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   Patient is right-handed.   Social Determinants of Health   Financial Resource Strain:   . Difficulty of Paying Living Expenses: Not on file  Food Insecurity:   . Worried About Charity fundraiser in the Last Year: Not on file  . Ran Out of Food in the Last Year: Not on file  Transportation Needs:   . Lack of Transportation (Medical): Not on file  . Lack of Transportation (Non-Medical): Not on file  Physical Activity:   . Days of Exercise per Week: Not on file  . Minutes of Exercise per Session: Not on file  Stress:   . Feeling of Stress : Not on file  Social Connections:   . Frequency of Communication with Friends and Family: Not on file  . Frequency of Social Gatherings with Friends and Family: Not on file  . Attends Religious Services: Not on file  . Active Member of Clubs or Organizations: Not on file  . Attends Archivist Meetings: Not on file  . Marital Status: Not on file  Intimate Partner Violence:   . Fear  of Current or Ex-Partner: Not on file  . Emotionally Abused: Not on file  . Physically Abused: Not on file  . Sexually Abused: Not on file      PHYSICAL EXAM Generalized: Well developed, in no acute distress   Neurological examination  Mentation: Alert oriented to time, place, history taking. Follows all commands speech and language fluent Cranial nerve II-XII:Extraocular movements were full. Facial symmetry noted. uvula tongue midline. Head turning and shoulder shrug  were normal and symmetric. Motor: Good strength  throughout subjectively per patient Sensory: Sensory testing is intact to soft touch on all 4 extremities subjectively per patient Coordination: Cerebellar testing reveals good finger-nose-finger  Reflexes: UTA  DIAGNOSTIC DATA (LABS, IMAGING, TESTING) - I reviewed patient records, labs, notes, testing and imaging myself where available.  Lab Results  Component Value Date   WBC 10.0 06/02/2020   HGB 13.3 06/02/2020   HCT 39.0 06/02/2020   MCV 94.7 06/02/2020   PLT 391 06/02/2020      Component Value Date/Time   NA 142 06/02/2020 1917   NA 138 04/16/2020 1145   K 3.6 06/02/2020 1917   CL 105 06/02/2020 1917   CO2 24 04/16/2020 1145   GLUCOSE 89 06/02/2020 1917   BUN 6 06/02/2020 1917   BUN 11 04/16/2020 1145   CREATININE 0.60 06/02/2020 1917   CALCIUM 10.8 (H) 04/16/2020 1145   PROT 8.1 04/05/2020 1604   PROT 7.2 07/10/2019 1008   ALBUMIN 4.8 04/05/2020 1604   ALBUMIN 5.2 (H) 07/10/2019 1008   AST 19 04/05/2020 1604   ALT 18 04/05/2020 1604   ALKPHOS 108 04/05/2020 1604   BILITOT 1.3 (H) 04/05/2020 1604   BILITOT 0.7 07/10/2019 1008   GFRNONAA 81 04/16/2020 1145   GFRAA 93 04/16/2020 1145   Lab Results  Component Value Date   CHOL 170 01/30/2018   HDL 57 01/30/2018   LDLCALC 95 01/30/2018   TRIG 91 01/30/2018   Lab Results  Component Value Date   HGBA1C 4.9 01/30/2018   Lab Results  Component Value Date   VITAMINB12 >2000 (H) 01/30/2018   Lab Results  Component Value Date   TSH 1.020 04/16/2020      ASSESSMENT AND PLAN 39 y.o. year old female  has a past medical history of Anxiety, Arthritis, Asthma, Bipolar disorder with depression ([place]), Chicken pox, Chronic migraine w/o aura, not intractable, w stat migr, Depression, Deviated septum, DJD (degenerative joint disease), GERD (gastroesophageal reflux disease), Narcolepsy, PTSD (post-traumatic stress disorder), PVC (premature ventricular contraction), and Seasonal allergic rhinitis. here  with:  Narcolepsy   Continue Xyrem and armodafinil  Advised if symptoms worsen or she develops new symptoms she should let us know  Follow-up in 1 year or sooner if needed   I spent 25 minutes of face-to-face and non-face-to-face time with patient.  This included previsit chart review, lab review, study review, order entry, electronic health record documentation, patient education.    Ward Givens, MSN, NP-C 07/22/2020, 11:28 AM [facility] [address] [place], [place] [address] [phone]

## 2020-07-26 ENCOUNTER — Encounter: Payer: Self-pay | Admitting: Family Medicine

## 2020-07-27 ENCOUNTER — Encounter: Payer: Self-pay | Admitting: Family Medicine

## 2020-07-27 ENCOUNTER — Telehealth: Payer: Self-pay | Admitting: Neurology

## 2020-07-27 ENCOUNTER — Other Ambulatory Visit: Payer: Self-pay | Admitting: Family Medicine

## 2020-07-27 ENCOUNTER — Other Ambulatory Visit: Payer: Self-pay | Admitting: *Deleted

## 2020-07-27 DIAGNOSIS — K219 Gastro-esophageal reflux disease without esophagitis: Secondary | ICD-10-CM

## 2020-07-27 DIAGNOSIS — L719 Rosacea, unspecified: Secondary | ICD-10-CM

## 2020-07-27 MED ORDER — DOXYCYCLINE HYCLATE 100 MG PO TABS: 100.0000 mg | ORAL_TABLET | Freq: Every day | ORAL | Status: DC

## 2020-07-27 NOTE — Telephone Encounter (Signed)
Pt requesting antibiotic and referral to dermatology   Pharmacy:[facility]  at [facility] [address]

## 2020-07-27 NOTE — Telephone Encounter (Signed)
PA completed for Xyrem through cover my meds/ [facility]. KEY:: [id number] Approved immediately until 07/27/2021

## 2020-07-27 NOTE — Telephone Encounter (Signed)
Rx and referral placed

## 2020-07-28 ENCOUNTER — Other Ambulatory Visit: Payer: Self-pay

## 2020-07-28 ENCOUNTER — Encounter: Payer: BC Managed Care – PPO | Admitting: Gastroenterology

## 2020-07-28 ENCOUNTER — Encounter: Payer: Self-pay | Admitting: Family Medicine

## 2020-07-28 MED ORDER — DOXYCYCLINE HYCLATE 100 MG PO TABS: 100.0000 mg | ORAL_TABLET | Freq: Every day | ORAL | 0 refills | Status: DC

## 2020-07-29 ENCOUNTER — Telehealth: Payer: Self-pay | Admitting: Adult Health

## 2020-07-29 ENCOUNTER — Encounter: Payer: Self-pay | Admitting: Family Medicine

## 2020-07-29 ENCOUNTER — Other Ambulatory Visit: Payer: Self-pay | Admitting: Cardiology

## 2020-07-29 DIAGNOSIS — I493 Ventricular premature depolarization: Secondary | ICD-10-CM

## 2020-07-29 NOTE — Telephone Encounter (Signed)
Note PA completed for Xyrem through cover my meds/ [facility]. KEY:: [id number] Approved immediately until 07/27/2021

## 2020-07-29 NOTE — Telephone Encounter (Signed)
[facility] [address]) called plan has approved  Sodium Oxybate 500 MG/ML SOLN. Need physician to request PA for cost of medication. Ask physician to call [phone]

## 2020-07-30 ENCOUNTER — Other Ambulatory Visit: Payer: Self-pay

## 2020-07-30 ENCOUNTER — Other Ambulatory Visit: Payer: BLUE CROSS/BLUE SHIELD

## 2020-07-30 DIAGNOSIS — I493 Ventricular premature depolarization: Secondary | ICD-10-CM

## 2020-07-30 NOTE — Addendum Note (Signed)
Addended by: Milton Ferguson D on: 07/30/2020 11:36 AM   Modules accepted: Orders

## 2020-07-31 ENCOUNTER — Encounter: Payer: Self-pay | Admitting: Family Medicine

## 2020-07-31 LAB — BASIC METABOLIC PANEL
BUN: 9 mg/dL (ref 7–25)
CO2: 31 mmol/L (ref 20–32)
Calcium: 10.4 mg/dL — ABNORMAL HIGH (ref 8.6–10.2)
Chloride: 102 mmol/L (ref 98–110)
Creat: 0.78 mg/dL (ref 0.50–1.10)
Glucose, Bld: 111 mg/dL — ABNORMAL HIGH (ref 65–99)
Potassium: 5 mmol/L (ref 3.5–5.3)
Sodium: 141 mmol/L (ref 135–146)

## 2020-07-31 LAB — MAGNESIUM: Magnesium: 2 mg/dL (ref 1.5–2.5)

## 2020-08-02 ENCOUNTER — Telehealth: Payer: Self-pay | Admitting: Medical

## 2020-08-02 NOTE — Telephone Encounter (Signed)
I called [facility] at [address] [facility] spoke to [place] [phone] and they referred me to [facility] at [phone] (with tomeko)xyrem not available at [facility], comes from [facility] [place] who stated pt is on bridge program (cost exceeds amt and needs [place] to call insurance at this # [phone] to relay that cost exceed maximum and override is needed).  ID# [id number] spoke to Janett Billow who states that pharmacy has to call them to answer questions about how they are billing quantity (over $10,000) and do a PA to cover the cost). Janett Billow was able to reach someone and be able to speak to her about what is needed.  (over 1 hour call).  Nothing more on this from me.

## 2020-08-02 NOTE — Telephone Encounter (Signed)
Patient call to report she accidentally took an extra dose of her metoprolol. She tlakes it for palpitations and the last time she was seen (7/27) it was increased from Toprol XL 25mg  BID to 37.5mg BID. Tonight she was talking with her husband and didn't notice she took an extra pill (50mg ). She said that she was not feeling dizzy or lightheaded. She has a BP cuff so I recommended she take her vitals 1-2 times that night at different times and write them down. Continue to monitor for symptoms. Also skip the morning dose and maybe re-start tomorrow night. Will let Dr. Gilman Schmidt know.   Debra Nelson Kathlen Mody, PA-C

## 2020-08-03 NOTE — Telephone Encounter (Signed)
Sounds like a good plan

## 2020-08-09 ENCOUNTER — Ambulatory Visit: Payer: BLUE CROSS/BLUE SHIELD | Admitting: Family Medicine

## 2020-08-09 ENCOUNTER — Encounter: Payer: Self-pay | Admitting: Family Medicine

## 2020-08-09 ENCOUNTER — Other Ambulatory Visit: Payer: Self-pay

## 2020-08-09 VITALS — BP 124/82 | HR 65 | Temp 98.8°F | Resp 18 | Ht 62.0 in | Wt 163.0 lb

## 2020-08-09 DIAGNOSIS — I493 Ventricular premature depolarization: Secondary | ICD-10-CM | POA: Diagnosis not present

## 2020-08-09 DIAGNOSIS — M797 Fibromyalgia: Secondary | ICD-10-CM | POA: Insufficient documentation

## 2020-08-09 DIAGNOSIS — J4550 Severe persistent asthma, uncomplicated: Secondary | ICD-10-CM

## 2020-08-09 DIAGNOSIS — G43111 Migraine with aura, intractable, with status migrainosus: Secondary | ICD-10-CM | POA: Diagnosis not present

## 2020-08-09 DIAGNOSIS — K219 Gastro-esophageal reflux disease without esophagitis: Secondary | ICD-10-CM

## 2020-08-09 NOTE — Patient Instructions (Addendum)
Health Maintenance Due  Topic Date Due  . INFLUENZA VACCINE Will call back with the date. 06/20/2020   For vertigo sounds like BPPV/BPV Richrd Sox, MD Vertigo Treatment [email or web address] -if not improving within several days or worsens let me refer you to vestibular rehab with PT. If you have new symptoms please let us know.   See if you can come off PM pepcid for a week- can restart if needed  Recommended follow up: Return in about 6 months (around 02/06/2021) for physical or sooner if needed.    Benign Positional Vertigo Vertigo is the feeling that you or your surroundings are moving when they are not. Benign positional vertigo is the most common form of vertigo. This is usually a harmless condition (benign). This condition is positional. This means that symptoms are triggered by certain movements and positions. This condition can be dangerous if it occurs while you are doing something that could cause harm to you or others. This includes activities such as driving or operating machinery. What are the causes? In many cases, the cause of this condition is not known. It may be caused by a disturbance in an area of the inner ear that helps your brain to sense movement and balance. This disturbance can be caused by:  Viral infection (labyrinthitis).  Head injury.  Repetitive motion, such as jumping, dancing, or running. What increases the risk? You are more likely to develop this condition if:  You are a woman.  You are 75 years of age or older. What are the signs or symptoms? Symptoms of this condition usually happen when you move your head or your eyes in different directions. Symptoms may start suddenly, and usually last for less than a minute. They include:  Loss of balance and falling.  Feeling like you are spinning or moving.  Feeling like your surroundings are spinning or moving.  Nausea and vomiting.  Blurred  vision.  Dizziness.  Involuntary eye movement (nystagmus). Symptoms can be mild and cause only minor problems, or they can be severe and interfere with daily life. Episodes of benign positional vertigo may return (recur) over time. Symptoms may improve over time. How is this diagnosed? This condition may be diagnosed based on:  Your medical history.  Physical exam of the head, neck, and ears.  Tests, such as: ? MRI. ? CT scan. ? Eye movement tests. Your health care provider may ask you to change positions quickly while he or she watches you for symptoms of benign positional vertigo, such as nystagmus. Eye movement may be tested with a variety of exams that are designed to evaluate or stimulate vertigo. ? An electroencephalogram (EEG). This records electrical activity in your brain. ? Hearing tests. You may be referred to a health care provider who specializes in ear, nose, and throat (ENT) problems (otolaryngologist) or a provider who specializes in disorders of the nervous system (neurologist). How is this treated?  This condition may be treated in a session in which your health care provider moves your head in specific positions to adjust your inner ear back to normal. Treatment for this condition may take several sessions. Surgery may be needed in severe cases, but this is rare. In some cases, benign positional vertigo may resolve on its own in 2-4 weeks. Follow these instructions at home: Safety  Move slowly. Avoid sudden body or head movements or certain positions, as told by your health care provider.  Avoid driving until your health care provider says it is  safe for you to do so.  Avoid operating heavy machinery until your health care provider says it is safe for you to do so.  Avoid doing any tasks that would be dangerous to you or others if vertigo occurs.  If you have trouble walking or keeping your balance, try using a cane for stability. If you feel dizzy or unstable,  sit down right away.  Return to your normal activities as told by your health care provider. Ask your health care provider what activities are safe for you. General instructions  Take over-the-counter and prescription medicines only as told by your health care provider.  Drink enough fluid to keep your urine pale yellow.  Keep all follow-up visits as told by your health care provider. This is important. Contact a health care provider if:  You have a fever.  Your condition gets worse or you develop new symptoms.  Your family or friends notice any behavioral changes.  You have nausea or vomiting that gets worse.  You have numbness or a "pins and needles" sensation. Get help right away if you:  Have difficulty speaking or moving.  Are always dizzy.  Faint.  Develop severe headaches.  Have weakness in your legs or arms.  Have changes in your hearing or vision.  Develop a stiff neck.  Develop sensitivity to light. Summary  Vertigo is the feeling that you or your surroundings are moving when they are not. Benign positional vertigo is the most common form of vertigo.  The cause of this condition is not known. It may be caused by a disturbance in an area of the inner ear that helps your brain to sense movement and balance.  Symptoms include loss of balance and falling, feeling that you or your surroundings are moving, nausea and vomiting, and blurred vision.  This condition can be diagnosed based on symptoms, physical exam, and other tests, such as MRI, CT scan, eye movement tests, and hearing tests.  Follow safety instructions as told by your health care provider. You will also be told when to contact your health care provider in case of problems. This information is not intended to replace advice given to you by your health care provider. Make sure you discuss any questions you have with your health care provider. Document Revised: 04/17/2018 Document Reviewed:  04/17/2018 Elsevier Patient Education  [employer].

## 2020-08-09 NOTE — Progress Notes (Signed)
Phone [phone] In person visit   Subjective:   Debra Nelson is a 39 y.o. year old very pleasant female patient who presents for/with See problem oriented charting Chief Complaint  Patient presents with  . Medication Management    This visit occurred during the SARS-CoV-2 public health emergency.  Safety protocols were in place, including screening questions prior to the visit, additional usage of staff PPE, and extensive cleaning of exam room while observing appropriate contact time as indicated for disinfecting solutions.   Past Medical History-  Patient Active Problem List   Diagnosis Date Noted  . Fibromyalgia 08/09/2020    Priority: High  . Chronic post-traumatic stress disorder (PTSD) 02/26/2017    Priority: High  . Narcolepsy cataplexy syndrome 03/06/2014    Priority: High  . Bipolar II disorder ([place]) 12/31/2013    Priority: High  . Symptomatic PVCs 02/26/2020    Priority: Medium  . Asthma 10/27/2016    Priority: Medium  . GERD (gastroesophageal reflux disease) 10/27/2016    Priority: Medium  . Intractable migraine with aura with status migrainosus 10/19/2015    Priority: Medium  . Recurrent isolated sleep paralysis 07/02/2018    Priority: Low  . Menstrual migraine without status migrainosus, not intractable 01/02/2018    Priority: Low  . Chronic thumb pain, bilateral 11/23/2017    Priority: Low  . Elevated TSH 05/07/2017    Priority: Low  . Trigger thumb, left thumb 03/15/2017    Priority: Low  . Sensorineural hearing loss (SNHL) of both ears 12/12/2016    Priority: Low  . Seasonal allergic rhinitis 05/02/2016    Priority: Low  . Right ear pain 05/02/2016    Priority: Low  . Dysfunction of both eustachian tubes 05/02/2016    Priority: Low  . Chondromalacia of knee 03/07/2013    Priority: Low  . Erroneous encounter - disregard 04/08/2020  . Atypical chest pain 10/06/2019  . Sinus tachycardia 08/27/2019  . Shortness of breath 08/27/2019     Medications- reviewed and updated Current Outpatient Medications  Medication Sig Dispense Refill  . ALPRAZolam (XANAX) 0.5 MG tablet Take 0.5 mg by mouth as needed for anxiety.     Marland Kitchen amoxicillin-clavulanate (AUGMENTIN) 875-125 MG tablet Take 1 tablet by mouth 2 (two) times daily.    . Armodafinil 150 MG tablet TAKE ONE TABLET BY MOUTH DAILY (Patient taking differently: as needed. ) 90 tablet 0  . cyclobenzaprine (FLEXERIL) 10 MG tablet Take 10 mg by mouth as needed.    . desloratadine (CLARINEX) 5 MG tablet Take 5 mg by mouth daily.    Marland Kitchen EPIPEN 2-PAK 0.3 MG/0.3ML SOAJ injection as needed.    . famotidine (PEPCID) 20 MG tablet Take 20 mg by mouth 2 (two) times daily.    . fluticasone (FLONASE SENSIMIST) 27.5 MCG/SPRAY nasal spray Place 2 sprays into the nose daily.    Marland Kitchen gabapentin (NEURONTIN) 300 MG capsule Take 600 mg by mouth 3 (three) times daily. Morning, Noon and 4 pm    . lamoTRIgine (LAMICTAL) 200 MG tablet Take 400 mg by mouth at bedtime.    . levalbuterol (XOPENEX HFA) 45 MCG/ACT inhaler Inhale 1-2 puffs into the lungs every 6 (six) hours as needed for wheezing (if having issues with high heart rate on albuterol). 1 Inhaler 5  . loratadine (CLARITIN) 10 MG tablet Take 10 mg by mouth daily.    . metoprolol succinate (TOPROL XL) 25 MG 24 hr tablet Take 1 tablet (25 mg total) by mouth 2 (two)  times daily. 60 tablet 3  . metroNIDAZOLE (METROGEL) 0.75 % gel Apply 1 application topically 2 (two) times daily. 45 g 0  . Multiple Vitamins-Minerals (HAIR SKIN AND NAILS FORMULA PO) Take 1 tablet by mouth daily.     Marland Kitchen olopatadine (PATANOL) 0.1 % ophthalmic solution     . ondansetron (ZOFRAN-ODT) 4 MG disintegrating tablet DISSOLVE ONE TABLET BY MOUTH EVERY 8 HOURS AS NEEDED FOR NAUSEA/ VOMITING 18 tablet 0  . pantoprazole (PROTONIX) 40 MG tablet TAKE ONE TABLET BY MOUTH DAILY 30 tablet 0  . predniSONE (RAYOS) 5 MG TBEC Take 5 mg by mouth at bedtime.    . promethazine (PHENERGAN) 25 MG tablet  TAKE ONE TABLET BY MOUTH EVERY 6 HOURS AS NEEDED FOR NAUSEA/ VOMITING 90 tablet 0  . pyridOXINE (VITAMIN B-6) 50 MG tablet Take 50 mg by mouth in the morning and at bedtime.    . Sodium Oxybate 500 MG/ML SOLN Take 3.5gms at bedtime. Take 4.5gms 2 hours later. Total of 8gms per night. Dispense 338ml. 360 mL 5  . SUMAtriptan (IMITREX) 100 MG tablet TAKE 1/2 TO 1  TABLET BY MOUTH AT ONSET OF HEADACHE; MAY REPEAT ONE TABLET IN 2 HOURS IF NEEDED. (Patient taking differently: Take 50-100 mg by mouth every 2 (two) hours as needed for headache. ) 10 tablet 5  . vitamin B-12 (CYANOCOBALAMIN) 1000 MCG tablet Take 1,000 mcg by mouth in the morning and at bedtime.    Marland Kitchen doxycycline (VIBRA-TABS) 100 MG tablet Take 1 tablet (100 mg total) by mouth daily. (Patient not taking: Reported on 08/09/2020) 42 tablet 0  . naltrexone (DEPADE) 50 MG tablet Take 25 mg by mouth daily.     No current facility-administered medications for this visit.     Objective:  BP 124/82   Pulse 65   Temp 98.8 F (37.1 C) (Temporal)   Resp 18   Ht 5\' 2"  (1.575 m)   Wt 163 lb (73.9 kg)   SpO2 98%   BMI 29.81 kg/m  Gen: NAD, resting comfortably CV: RRR no murmurs rubs or gallops Lungs: CTAB no crackles, wheeze, rhonchi Ext: no edema Skin: warm, dry Neuro: CN II-XII intact, sensation and reflexes normal throughout, 5/5 muscle strength in bilateral upper and lower extremities. Normal finger to nose. Normal rapid alternating movements. No pronator drift. Normal romberg. Normal gait.   Positive dix hallpoke    Assessment and Plan   # Sinus infection- on augmentin through Dr. Paulla Fore starting on Friday. Got tested for covid 19 before that with PCR and negative. Sinus surgery - lower nasal turbinate reduction surgery.   # palpitations/dizziness--> actually sounds like vertigo she was up to 37.5 mg metoprolol BID which increased but had some dizzy spells on the higher dose. Working with cardiology. Went back down to lower dose 25 mg.  She has noted if moves heard in certain position or turns over in bed she gets vertigo episodes. Denies hearing loss or increased tinnitus. Also has sensation of fullness in face that makes her feels somewhat presyncopal in short periods. Palpitations on albuterol so prefers levalbuterol -neuro exam reassuring -Sounds like patient actually has BPPV. Since one of her goals today was to reduce med will hold off on meclizine. Will try home exercises.   % MSK concerns- follows with Dr. Paulla Fore. On flexeril 10 mg  #Medication Management S: Patient stated that Dr. Paulla Fore wanted to her to go over medication list with our office to see if something could be possibly changed or  discontinued. Recently diagnosed with fibromyalgia A/P: I do not see many meds I can personally wean but we discussed the following -working with psych to wean xanax down to 3 a day from 6 a day. hol doff on changing gabapentin until off xanax. Also considering reducing lamictal long term.  -has weaned armodafinil to as needed -on naltrexone for ongoing joint pain/myalgias through Dr. Paulla Fore. Pain is more bearable   #GERD S: Medication: Protonix 40 mg daily, Pepcid over-the-counter- only taking at night recently and doing reasonably well Triggers: Patient with worsening symptoms during allergy season.  Has been referred to GI but illness has prevented her from going- doing better fortunately A/P: I think its reasonable to wean off pepcid- did well with none in AM, now can try none in PM and if doing well next visit may try protonix 20mg    % Migraines-managed by neurology Dr. Brett Fairy S: Medication: Takes vitamin B6.   Normal MRI brain 01/31/20 ED vsiit for near syncope, tunnel vision- may hae been anxiety or complex migraine.  As needed treatment: Phenergan for nausea PO only, sumatriptan  Another reassuring MRI Apr 05, 2020 as well as CT head for code stroke now presenting with slurred speech and left-sided weakness.  Neurology  thought this was a complicated migraine A/P: reasonable control- I do not see we can reduce these medications.    % Narcolepsy and cataplexy syndrome-follows with Dr. Brett Fairy S: medication: armodafinil 150 mg daily and Xyrem (sodium oxybate) A/P: reasonable control- continue follow up with neurology Dr. Brett Fairy   % Bipolar III disorder/PTSD/anxiety managed by Dr. Daron Offer previously now with a psychiatrist in [place] S: Medication: lamictal 400mg , gabapentin 300 mg 3 times a day, xanax currently on wean -prior buspar 15 mg BID A/P: reasonable control- continue current meds  % Asthma and allergies- managed by allergist S: Maintenance Medication for asthma: arnuity In past- will talk to allergist about switching- had bene on singulair Maintenance medications for allergies: Claritin a.m., Clarinex and p.m., olopatadine and Flonase nasally. As needed medication: Xopenex. Patient is using this <2 per week.  A/P: reasonable control- do not see we can switch meds   % PVCs- managed by Dr. Marianna Fuss: Medication: Metoprolol 25 mg twice daily  A/P: short term increase but now back to 25 mg BID- still with symptoms but more manageable.    Recommended follow up: 6 months physical or sooner if needed Future Appointments  Date Time Provider [place]  08/11/2020  1:30 PM Melvenia Beam, MD [facility] None  09/15/2020 11:20 AM Donato Heinz, MD [facility]  08/02/2021  2:45 PM Ward Givens, NP [facility] None    Lab/Order associations:   ICD-10-CM   1. Symptomatic PVCs  I49.3   2. Fibromyalgia  M79.7   3. Intractable migraine with aura with status migrainosus  G43.111   4. Gastroesophageal reflux disease without esophagitis  K21.9   5. Severe persistent asthma without complication  R15.40     Time Spent: 33 minutes of total time (1:36 PM- 2:09 PM) was spent on the date of the encounter performing the following actions: chart review prior to seeing the patient,  obtaining history, performing a medically necessary exam, counseling on the treatment plan, placing orders, and documenting in our EHR.   Return precautions advised.  Garret Reddish, MD

## 2020-08-10 NOTE — Progress Notes (Deleted)
[facility]    Provider:  Dr Jaynee Eagles Requesting Provider: Larey Seat, MD Primary Care Provider:  Marin Olp, MD  CC:  ***  HPI:  Debra Nelson is a 39 y.o. female here as requested by Dr. Asencion Partridge Dohmeier for second opinion of migraines. She has a past medical history of narcolepsy with cataplexy with an increase in headaches with varying location can sometimes be sharp or dull, she does have photophobia and phonophobia on occasion, denies nausea and vomiting, she does take Tylenol reports it worked intermittently almost with sumatriptan hand and in the past she has been on Zonegran per review of notes.  From a review of records medications that patient has been on in the past that can be used for migraine management include: Zonegran, Flexeril, diclofenac, Relpax, Lexapro, gabapentin, imipramine, Lamictal, Toradol injections, Mobic, magnesium, Medrol Dosepak and methylprednisolone injections, Reglan injections, Toprol all oral, Zofran oral, prednisone tablets, Phenergan tablets, sumatriptan,  Reviewed notes, labs and imaging from outside physicians, which showed ***  MRI of the brain Apr 05, 2020: Normal, personally reviewed images.  Review of Systems: Patient complains of symptoms per HPI as well as the following symptoms ***. Pertinent negatives and positives per HPI. All others negative.   Social History   Socioeconomic History  . Marital status: Married    Spouse name: [name]  . Number of children: 3  . Years of education: College  . Highest education level: Not on file  Occupational History    Employer: OTHER  Tobacco Use  . Smoking status: Never Smoker  . Smokeless tobacco: Never Used  Vaping Use  . Vaping Use: Never used  Substance and Sexual Activity  . Alcohol use: No  . Drug use: No  . Sexual activity: Yes    Partners: Male    Birth control/protection: Pill  Other Topics Concern  . Not on file  Social History Narrative   Patient  is married ([name]) and lives at home with her family   4 adults - 2 kids 50% of the time (1 son, 1 daughter). 2 older children (one age 30 from rape, then 28 year old- does not get to see- related to bipolar) Moved in with in laws to help them      Patient has 4 children (3 of her own and 1 step son).    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   Patient is right-handed.   Social Determinants of Health   Financial Resource Strain:   . Difficulty of Paying Living Expenses: Not on file  Food Insecurity:   . Worried About Charity fundraiser in the Last Year: Not on file  . Ran Out of Food in the Last Year: Not on file  Transportation Needs:   . Lack of Transportation (Medical): Not on file  . Lack of Transportation (Non-Medical): Not on file  Physical Activity:   . Days of Exercise per Week: Not on file  . Minutes of Exercise per Session: Not on file  Stress:   . Feeling of Stress : Not on file  Social Connections:   . Frequency of Communication with Friends and Family: Not on file  . Frequency of Social Gatherings with Friends and Family: Not on file  . Attends Religious Services: Not on file  . Active Member of Clubs or Organizations: Not on  file  . Attends Archivist Meetings: Not on file  . Marital Status: Not on file  Intimate Partner Violence:   . Fear of Current or Ex-Partner: Not on file  . Emotionally Abused: Not on file  . Physically Abused: Not on file  . Sexually Abused: Not on file    Family History  Problem Relation Age of Onset  . Mental illness Mother        does not speak to regularly so does not know full history  . Other Father        medical issues, divorce related  . Other Brother        ptsd- fully discharged medically disabled before age 33  . Heart murmur Brother   . Cancer Paternal Grandfather        type unknown    Past Medical  History:  Diagnosis Date  . Anxiety   . Arthritis   . Asthma   . Bipolar disorder with depression ([place])    and PTSD  . Chicken pox   . Chronic migraine w/o aura, not intractable, w stat migr   . Depression   . Deviated septum   . DJD (degenerative joint disease)    Severe of the right knee-Dr Creighton-ortho and Dr Gean Birchwood, PA-preferred pain managment  . GERD (gastroesophageal reflux disease)   . Narcolepsy   . PTSD (post-traumatic stress disorder)   . PVC (premature ventricular contraction)   . Seasonal allergic rhinitis     Patient Active Problem List   Diagnosis Date Noted  . Fibromyalgia 08/09/2020  . Erroneous encounter - disregard 04/08/2020  . Symptomatic PVCs 02/26/2020  . Atypical chest pain 10/06/2019  . Sinus tachycardia 08/27/2019  . Shortness of breath 08/27/2019  . Recurrent isolated sleep paralysis 07/02/2018  . Menstrual migraine without status migrainosus, not intractable 01/02/2018  . Chronic thumb pain, bilateral 11/23/2017  . Elevated TSH 05/07/2017  . Trigger thumb, left thumb 03/15/2017  . Chronic post-traumatic stress disorder (PTSD) 02/26/2017  . Sensorineural hearing loss (SNHL) of both ears 12/12/2016  . Asthma 10/27/2016  . GERD (gastroesophageal reflux disease) 10/27/2016  . Seasonal allergic rhinitis 05/02/2016  . Right ear pain 05/02/2016  . Dysfunction of both eustachian tubes 05/02/2016  . Intractable migraine with aura with status migrainosus 10/19/2015  . Narcolepsy cataplexy syndrome 03/06/2014  . Bipolar II disorder ([place]) 12/31/2013  . Chondromalacia of knee 03/07/2013    Past Surgical History:  Procedure Laterality Date  . knee surgery x5     08,11, 12, 13 x2- R knee  . right foot surgery     x 3, arthritis/bone spurus. plate placed.   . WISDOM TOOTH EXTRACTION      Current Outpatient Medications  Medication Sig Dispense Refill  . ALPRAZolam (XANAX) 0.5 MG tablet Take 0.5 mg by mouth as needed for anxiety.     Marland Kitchen  amoxicillin-clavulanate (AUGMENTIN) 875-125 MG tablet Take 1 tablet by mouth 2 (two) times daily.    . Armodafinil 150 MG tablet TAKE ONE TABLET BY MOUTH DAILY (Patient taking differently: as needed. ) 90 tablet 0  . cyclobenzaprine (FLEXERIL) 10 MG tablet Take 10 mg by mouth as needed.    . desloratadine (CLARINEX) 5 MG tablet Take 5 mg by mouth daily.    Marland Kitchen doxycycline (VIBRA-TABS) 100 MG tablet Take 1 tablet (100 mg total) by mouth daily. (Patient not taking: Reported on 08/09/2020) 42 tablet 0  . EPIPEN 2-PAK 0.3 MG/0.3ML SOAJ injection as needed.    Marland Kitchen  famotidine (PEPCID) 20 MG tablet Take 20 mg by mouth 2 (two) times daily.    . fluticasone (FLONASE SENSIMIST) 27.5 MCG/SPRAY nasal spray Place 2 sprays into the nose daily.    Marland Kitchen gabapentin (NEURONTIN) 300 MG capsule Take 600 mg by mouth 3 (three) times daily. Morning, Noon and 4 pm    . lamoTRIgine (LAMICTAL) 200 MG tablet Take 400 mg by mouth at bedtime.    . levalbuterol (XOPENEX HFA) 45 MCG/ACT inhaler Inhale 1-2 puffs into the lungs every 6 (six) hours as needed for wheezing (if having issues with high heart rate on albuterol). 1 Inhaler 5  . loratadine (CLARITIN) 10 MG tablet Take 10 mg by mouth daily.    . metoprolol succinate (TOPROL XL) 25 MG 24 hr tablet Take 1 tablet (25 mg total) by mouth 2 (two) times daily. 60 tablet 3  . metroNIDAZOLE (METROGEL) 0.75 % gel Apply 1 application topically 2 (two) times daily. 45 g 0  . Multiple Vitamins-Minerals (HAIR SKIN AND NAILS FORMULA PO) Take 1 tablet by mouth daily.     . naltrexone (DEPADE) 50 MG tablet Take 25 mg by mouth daily.    Marland Kitchen olopatadine (PATANOL) 0.1 % ophthalmic solution     . ondansetron (ZOFRAN-ODT) 4 MG disintegrating tablet DISSOLVE ONE TABLET BY MOUTH EVERY 8 HOURS AS NEEDED FOR NAUSEA/ VOMITING 18 tablet 0  . pantoprazole (PROTONIX) 40 MG tablet TAKE ONE TABLET BY MOUTH DAILY 30 tablet 0  . predniSONE (RAYOS) 5 MG TBEC Take 5 mg by mouth at bedtime.    . promethazine  (PHENERGAN) 25 MG tablet TAKE ONE TABLET BY MOUTH EVERY 6 HOURS AS NEEDED FOR NAUSEA/ VOMITING 90 tablet 0  . pyridOXINE (VITAMIN B-6) 50 MG tablet Take 50 mg by mouth in the morning and at bedtime.    . Sodium Oxybate 500 MG/ML SOLN Take 3.5gms at bedtime. Take 4.5gms 2 hours later. Total of 8gms per night. Dispense 341ml. 360 mL 5  . SUMAtriptan (IMITREX) 100 MG tablet TAKE 1/2 TO 1  TABLET BY MOUTH AT ONSET OF HEADACHE; MAY REPEAT ONE TABLET IN 2 HOURS IF NEEDED. (Patient taking differently: Take 50-100 mg by mouth every 2 (two) hours as needed for headache. ) 10 tablet 5  . vitamin B-12 (CYANOCOBALAMIN) 1000 MCG tablet Take 1,000 mcg by mouth in the morning and at bedtime.     No current facility-administered medications for this visit.    Allergies as of 08/11/2020 - Review Complete 08/09/2020  Allergen Reaction Noted  . Klonopin [clonazepam] Nausea Only 12/19/2013  . Phenergan [promethazine hcl]  12/26/2011  . Promethazine Other (See Comments) 03/07/2013  . Promethazine hcl Other (See Comments) 12/26/2011  . Topamax [topiramate] Nausea And Vomiting 12/19/2013    Vitals: There were no vitals taken for this visit. Last Weight:  Wt Readings from Last 1 Encounters:  08/09/20 163 lb (73.9 kg)   Last Height:   Ht Readings from Last 1 Encounters:  08/09/20 5\' 2"  (1.575 m)     Physical exam: Exam: Gen: NAD, conversant, well nourised, obese, well groomed                     CV: RRR, no MRG. No Carotid Bruits. No peripheral edema, warm, nontender Eyes: Conjunctivae clear without exudates or hemorrhage  Neuro: Detailed Neurologic Exam  Speech:    Speech is normal; fluent and spontaneous with normal comprehension.  Cognition:    The patient is oriented to person, place, and time;  recent and remote memory intact;     language fluent;     normal attention, concentration,     fund of knowledge Cranial Nerves:    The pupils are equal, round, and reactive to light. The fundi  are normal and spontaneous venous pulsations are present. Visual fields are full to finger confrontation. Extraocular movements are intact. Trigeminal sensation is intact and the muscles of mastication are normal. The face is symmetric. The palate elevates in the midline. Hearing intact. Voice is normal. Shoulder shrug is normal. The tongue has normal motion without fasciculations.   Coordination:    Normal finger to nose and heel to shin. Normal rapid alternating movements.   Gait:    Heel-toe and tandem gait are normal.   Motor Observation:    No asymmetry, no atrophy, and no involuntary movements noted. Tone:    Normal muscle tone.    Posture:    Posture is normal. normal erect    Strength:    Strength is V/V in the upper and lower limbs.      Sensation: intact to LT     Reflex Exam:  DTR's:    Deep tendon reflexes in the upper and lower extremities are normal bilaterally.   Toes:    The toes are downgoing bilaterally.   Clonus:    Clonus is absent.    Assessment/Plan:    No orders of the defined types were placed in this encounter.  No orders of the defined types were placed in this encounter.   Cc: Marin Olp, MD,  Marin Olp, MD  Sarina Ill, MD  [facility] [address] [place], [place] [address]  Phone [phone] Fax [phone]

## 2020-08-11 ENCOUNTER — Institutional Professional Consult (permissible substitution): Payer: BC Managed Care – PPO | Admitting: Neurology

## 2020-08-11 ENCOUNTER — Other Ambulatory Visit: Payer: Self-pay

## 2020-08-11 ENCOUNTER — Encounter: Payer: Self-pay | Admitting: Family Medicine

## 2020-08-12 ENCOUNTER — Other Ambulatory Visit: Payer: Self-pay

## 2020-08-12 DIAGNOSIS — H8110 Benign paroxysmal vertigo, unspecified ear: Secondary | ICD-10-CM

## 2020-08-19 ENCOUNTER — Encounter: Payer: Self-pay | Admitting: Family Medicine

## 2020-08-20 ENCOUNTER — Other Ambulatory Visit: Payer: Self-pay | Admitting: Family Medicine

## 2020-08-20 ENCOUNTER — Other Ambulatory Visit: Payer: Self-pay

## 2020-08-20 DIAGNOSIS — K219 Gastro-esophageal reflux disease without esophagitis: Secondary | ICD-10-CM

## 2020-08-20 DIAGNOSIS — H8110 Benign paroxysmal vertigo, unspecified ear: Secondary | ICD-10-CM

## 2020-08-23 ENCOUNTER — Encounter: Payer: Self-pay | Admitting: Family Medicine

## 2020-08-24 ENCOUNTER — Encounter: Payer: Self-pay | Admitting: Family Medicine

## 2020-08-24 ENCOUNTER — Encounter: Payer: Self-pay | Admitting: Adult Health

## 2020-08-24 DIAGNOSIS — G4753 Recurrent isolated sleep paralysis: Secondary | ICD-10-CM

## 2020-08-24 DIAGNOSIS — G47419 Narcolepsy without cataplexy: Secondary | ICD-10-CM

## 2020-08-24 DIAGNOSIS — G4489 Other headache syndrome: Secondary | ICD-10-CM

## 2020-08-24 DIAGNOSIS — G4736 Sleep related hypoventilation in conditions classified elsewhere: Secondary | ICD-10-CM

## 2020-08-25 ENCOUNTER — Ambulatory Visit (INDEPENDENT_AMBULATORY_CARE_PROVIDER_SITE_OTHER): Payer: BLUE CROSS/BLUE SHIELD | Admitting: Rehabilitative and Restorative Service Providers"

## 2020-08-25 ENCOUNTER — Other Ambulatory Visit: Payer: Self-pay

## 2020-08-25 DIAGNOSIS — H8111 Benign paroxysmal vertigo, right ear: Secondary | ICD-10-CM

## 2020-08-25 DIAGNOSIS — R29818 Other symptoms and signs involving the nervous system: Secondary | ICD-10-CM

## 2020-08-25 DIAGNOSIS — R42 Dizziness and giddiness: Secondary | ICD-10-CM

## 2020-08-25 DIAGNOSIS — G4489 Other headache syndrome: Secondary | ICD-10-CM | POA: Insufficient documentation

## 2020-08-25 DIAGNOSIS — G47419 Narcolepsy without cataplexy: Secondary | ICD-10-CM | POA: Insufficient documentation

## 2020-08-25 DIAGNOSIS — G4736 Sleep related hypoventilation in conditions classified elsewhere: Secondary | ICD-10-CM | POA: Insufficient documentation

## 2020-08-25 NOTE — Telephone Encounter (Signed)
Spoke with pt and since Friday has noted O2 dropping to 93% up and down and also notes dropping in pm as well .Also HR running in the  90's and low 100's Pt also states has SOB and this occurs when talking B/P yesterday was 129/92 which is high for pt and also" feels like needs to yawn and can't"Will forward to Dr Gardiner Rhyme for review and recommendations ./cy

## 2020-08-25 NOTE — Telephone Encounter (Signed)
I could not find where I could send a message to Dr. Anastasia Pall but my PCP wanted me to get in touch with her and see if she would like me to come in. My O2 sats seem to be dropping fairly low and I feel crummy all the time. He mentioned she might would want to do a sleep study if she thinks it warrants one.    Response to : "I could not find where I could send a message to Dr. Anastasia Pall but my PCP wanted me to get in touch with her and see if she would like me to come in. My O2 sats seem to be dropping fairly low and I feel crummy all the time. He mentioned she might would want to do a sleep study if she thinks it warrants one. "   The patient was just seen last month by Vaughan Browner, this visit was not too long ago to be the base for a referral for sleep study.  We will probably only get a home sleep test unless there is a cardiac complication or any other significant change in her medical history.  The home sleep test however it is a more accurate measure of nocturnal hypoxia and of course can screen for sleep apnea.  I will put an order in I will ask that the patient feels an Epworth sleepiness scale and fatigue severity score.  I would use a height and weight from her last visit in person.   FYI _ Gerline Legacy, [place]  Larey Seat, MD

## 2020-08-25 NOTE — Therapy (Signed)
[place], [place], [address] Phone: [phone]   Fax:  [phone]  Physical Therapy Evaluation  Patient Details  Name: Debra Nelson MRN: [id number] Date of Birth: 14-Oct-1981 Referring Provider (PT): Garret Reddish, MD   Encounter Date: 08/25/2020   PT End of Session - 08/25/20 1136    Visit Number 1    Number of Visits 6    Date for PT Re-Evaluation 10/06/20    PT Start Time 1020    PT Stop Time 1115    PT Time Calculation (min) 55 min           Past Medical History:  Diagnosis Date  . Anxiety   . Arthritis   . Asthma   . Bipolar disorder with depression ([place])    and PTSD  . Chicken pox   . Chronic migraine w/o aura, not intractable, w stat migr   . Depression   . Deviated septum   . DJD (degenerative joint disease)    Severe of the right knee-Dr Creighton-ortho and Dr Gean Birchwood, PA-preferred pain managment  . GERD (gastroesophageal reflux disease)   . Narcolepsy   . PTSD (post-traumatic stress disorder)   . PVC (premature ventricular contraction)   . Seasonal allergic rhinitis     Past Surgical History:  Procedure Laterality Date  . knee surgery x5     08,11, 12, 13 x2- R knee  . right foot surgery     x 3, arthritis/bone spurus. plate placed.   . WISDOM TOOTH EXTRACTION      There were no vitals filed for this visit.    Subjective Assessment - 08/25/20 1023    Subjective The patient reports waking with dizziness about 2 months ago.  She gets a sensation of continuing to roll after rolling.  She describes a sensation of "wobbling" with quick head movements.  She feels disoriented if she turns quickly and has stopped driving.  She also notes a motion sensitivity when watching movement on television.  She had a sinus infection 2-3 weeks ago that worsened symptoms of dizziness.  No prior history of vertigo.  She denies visual changes, but does note she got new glasses  that she cannot tolerate for more than 30 minutes at a time.  The lenses are to correct a stygmatism (they are progressive lenses).  She gets nausea intermittently that seems to be worsening.  She has nausea today ( has taken something for it 3 days this week).  She denies hearing changes, but does note occasional tinnitus and ringing.    Pertinent History Nasal synergy a month ago, migraines (has recently changed for ocular migraines and complex migraines with tingling), asthma, environment/seasonal allergies, bipolar, PTSD, anxiety,    Patient Stated Goals Return to driving.    Currently in Pain? No/denies              [facility] PT Assessment - 08/25/20 1030      Assessment   Medical Diagnosis BPPV    Referring Provider (PT) Garret Reddish, MD    Onset Date/Surgical Date 08/20/20   2 months ago   Prior Therapy none      Precautions   Precautions None      Restrictions   Weight Bearing Restrictions No      Balance Screen   Has the patient fallen in the past 6 months No    Has the patient had a decrease in activity level because of a fear of  falling?  No    Is the patient reluctant to leave their home because of a fear of falling?  No      Home Ecologist residence    Living Arrangements Spouse/significant other;Other relatives;Children   moved in with in laws   Type of [place] to enter    Home Layout Two level    Additional Comments veers with walking when she first rises      Prior Function   Level of Independence Independent    Leisure assists at home with in laws and children, unable to drive currently      Observation/Other Assessments   Focus on Therapeutic Outcomes (FOTO)  50% limitation                 Vestibular Assessment - 08/25/20 1045      Vestibular Assessment   General Observation Baseline 1/10, has L sided face tingling.  She is nauseous this morning and took phenergan.      Symptom Behavior     Subjective history of current problem Worsening of symptoms since onset.  Initially dizzy present, then worsened with sinus infection, and now back to prior baseline.  She is having a change in migraine characteristics with numbness.     Type of Dizziness  Spinning    Frequency of Dizziness daily    Duration of Dizziness seconds    Symptom Nature Motion provoked;Positional    Aggravating Factors Looking up to the ceiling;Turning head quickly;Rolling to right;Rolling to left;Forward bending    Relieving Factors Head stationary    Progression of Symptoms Worse    History of similar episodes none      Oculomotor Exam   Oculomotor Alignment Normal    Ocular ROM WFLs    Spontaneous Absent    Gaze-induced  Absent    Smooth Pursuits Intact    Saccades Intact    Comment eye exam increases symptoms feeling like L eye is slower and "has to catch up"      Vestibulo-Ocular Reflex   VOR 1 Head Only (x 1 viewing) x 5 reps provokes a sensation of dizziness (unable to rate noting "it's not pleasant")    VOR Cancellation Normal   able to fixate on target, but gets 4/10 dizziness     Positional Testing   Dix-Hallpike Dix-Hallpike Right;Dix-Hallpike Left    Sidelying Test Sidelying Left;Sidelying Right    Horizontal Canal Testing Horizontal Canal Right;Horizontal Canal Left      Dix-Hallpike Right   Dix-Hallpike Right Duration trace nystagmus viewed in room light after latency of 8-10 seconds    Dix-Hallpike Right Symptoms Other (comment)   did not differentiate direction; settled quickly     Dix-Hallpike Left   Dix-Hallpike Left Duration trace sensation    Dix-Hallpike Left Symptoms No nystagmus      Sidelying Right   Sidelying Right Duration positive for 10 seconds of nystagmus    Sidelying Right Symptoms Upbeat, left rotatory nystagmus      Sidelying Left   Sidelying Left Duration trace sensation    Sidelying Left Symptoms No nystagmus      Horizontal Canal Right   Horizontal Canal  Right Duration trace sensation wit 2-3 beats of nystagmus observed when moving eyes to left (using alexander's law)    Horizontal Canal Right Symptoms Ageotrophic      Horizontal Canal Left   Horizontal Canal Left Duration none    Horizontal Canal  Left Symptoms Normal      Positional Sensitivities   Positional Sensitivities Comments *can roll either way in bed and feel it; suspect horizontal canal involvement (L horiz cupulo) and + R sidelying (suspect R posterior canalithiasis)              Objective measurements completed on examination: See above findings.        Vestibular Treatment/Exercise - 08/25/20 1107      Vestibular Treatment/Exercise   Vestibular Treatment Provided Canalith Repositioning;Gaze    Canalith Repositioning Epley Manuever Right    Gaze Exercises X1 Viewing Horizontal       EPLEY MANUEVER RIGHT   Number of Reps  1    Response Details  patient needs min A due to sensation of falling; dizziness during rolling onto L shoulder      X1 Viewing Horizontal   Foot Position seated    Comments gaze x 12 reps before worsening symptoms                      PT Long Term Goals - 08/25/20 1136      PT LONG TERM GOAL #1   Title The patient will be indep with HEP for gaze adaptation, standing balance.    Time 6    Period Weeks    Target Date 10/06/20      PT LONG TERM GOAL #2   Title The patient will have no dizziness with sit<>bilateral sidelying indicating resolution of BPPV.    Time 6    Period Weeks    Target Date 10/06/20      PT LONG TERM GOAL #3   Title The patient will imrpove functional outcomes score from 50% limitation to 22% limitation.    Time 6    Period Weeks    Target Date 10/06/20                  Plan - 08/25/20 1117    Clinical Impression Statement The patient is a 39 yo female presenting to OP physical therapy with multi-factorial dizziness.  At today's evaluation, R sidelying was provoking for nystagmus  (resolve quickly, but appear to be upbeat, right rotary).  She also has other impairments i ncluding motion sensitivity with all positional testing, intolerance ot gaze adaptation, diminished VOR, and visual changes associated with recent change in migraines.  The patient also reports she has corrective lenses that she is unable to tolerate wearing.  PT to treat positional symptoms and begin with gaze adaptation and multi-sensory balance as indicated.  We will further discuss visual input/lenses as indicated for functional mobility.  PT to address deficits to promote return to driving and IADLs.    Personal Factors and Comorbidities Comorbidity 1    Comorbidities migraines    Examination-Activity Limitations Bed Mobility;Bend    Examination-Participation Restrictions Cleaning;Community Activity;Driving    Stability/Clinical Decision Making Stable/Uncomplicated    Clinical Decision Making Low    Rehab Potential Good    PT Frequency 1x / week    PT Duration 6 weeks    PT Treatment/Interventions ADLs/Self Care Home Management;Vestibular;Canalith Repostioning;Neuromuscular re-education;Therapeutic activities;Gait training;Patient/family education;Manual lymph drainage    PT Next Visit Plan Check VOR x 1 viewing, reassess positional symtpoms, discuss lenses for functional mobility, motion sensitivity HEP    Consulted and Agree with Plan of Care Patient           Patient will benefit from skilled therapeutic intervention in order to improve the following deficits  and impairments:  Dizziness, Decreased activity tolerance  Visit Diagnosis: BPPV (benign paroxysmal positional vertigo), right  Dizziness and giddiness  Other symptoms and signs involving the nervous system     Problem List Patient Active Problem List   Diagnosis Date Noted  . Primary narcolepsy without cataplexy 08/25/2020  . Other headache syndrome 08/25/2020  . Hypoxemia associated with sleep 08/25/2020  . Fibromyalgia  08/09/2020  . Erroneous encounter - disregard 04/08/2020  . Symptomatic PVCs 02/26/2020  . Atypical chest pain 10/06/2019  . Sinus tachycardia 08/27/2019  . Shortness of breath 08/27/2019  . Recurrent isolated sleep paralysis 07/02/2018  . Menstrual migraine without status migrainosus, not intractable 01/02/2018  . Chronic thumb pain, bilateral 11/23/2017  . Elevated TSH 05/07/2017  . Trigger thumb, left thumb 03/15/2017  . Chronic post-traumatic stress disorder (PTSD) 02/26/2017  . Sensorineural hearing loss (SNHL) of both ears 12/12/2016  . Asthma 10/27/2016  . GERD (gastroesophageal reflux disease) 10/27/2016  . Seasonal allergic rhinitis 05/02/2016  . Right ear pain 05/02/2016  . Dysfunction of both eustachian tubes 05/02/2016  . Intractable migraine with aura with status migrainosus 10/19/2015  . Narcolepsy cataplexy syndrome 03/06/2014  . Bipolar II disorder ([place]) 12/31/2013  . Chondromalacia of knee 03/07/2013    Debra Nelson, PT 08/25/2020, 11:46 AM  [facility] [place], [place], [address] Phone: [phone]   Fax:  [phone]  Name: Debra Nelson MRN: [id number] Date of Birth: 1981/11/14

## 2020-08-25 NOTE — Patient Instructions (Signed)
Access Code: [email or web address] URL: [email or web address] Date: 08/25/2020 Prepared by: Rudell Cobb  Exercises Seated Gaze Stabilization with Head Rotation - 2 x daily - 7 x weekly - 1 sets - 15 reps

## 2020-08-26 ENCOUNTER — Telehealth: Payer: Self-pay

## 2020-08-26 NOTE — Telephone Encounter (Signed)
LVM to schedule HST °

## 2020-08-27 ENCOUNTER — Telehealth: Payer: Self-pay

## 2020-08-27 NOTE — Telephone Encounter (Signed)
MEDICATION: Zofran 4 MG  PHARMACY: [facility]  Comments:   **Let patient know to contact pharmacy at the end of the day to make sure medication is ready. **  ** Please notify patient to allow 48-72 hours to process**  **Encourage patient to contact the pharmacy for refills or they can request refills through [facility]**

## 2020-08-28 ENCOUNTER — Other Ambulatory Visit: Payer: Self-pay | Admitting: Cardiology

## 2020-08-30 ENCOUNTER — Encounter: Payer: Self-pay | Admitting: Neurology

## 2020-08-30 ENCOUNTER — Other Ambulatory Visit: Payer: Self-pay

## 2020-08-30 ENCOUNTER — Ambulatory Visit: Payer: BLUE CROSS/BLUE SHIELD | Admitting: Neurology

## 2020-08-30 VITALS — BP 136/94 | HR 89 | Ht 61.5 in | Wt 163.0 lb

## 2020-08-30 DIAGNOSIS — G43109 Migraine with aura, not intractable, without status migrainosus: Secondary | ICD-10-CM

## 2020-08-30 DIAGNOSIS — G43709 Chronic migraine without aura, not intractable, without status migrainosus: Secondary | ICD-10-CM | POA: Diagnosis not present

## 2020-08-30 MED ORDER — ONDANSETRON 4 MG PO TBDP: ORAL_TABLET | ORAL | 0 refills | Status: DC

## 2020-08-30 MED ORDER — EMGALITY 120 MG/ML ~~LOC~~ SOAJ: 120.0000 mg | SUBCUTANEOUS | 11 refills | Status: DC

## 2020-08-30 MED ORDER — NURTEC 75 MG PO TBDP: 75.0000 mg | ORAL_TABLET | Freq: Every day | ORAL | 6 refills | Status: DC | PRN

## 2020-08-30 NOTE — Progress Notes (Signed)
[facility]    Provider:  Dr Jaynee Eagles Requesting Provider: Dohmeier, Asencion Partridge, MD Primary Care Provider:  Marin Olp, MD  CC:  Second opinion on migraines  HPI:  Debra Nelson is a 39 y.o. female here as requested by Dohmeier, Asencion Partridge, MD for second opinion on migraines. PMHx narcolepsy and cataplexy, depression.  I reviewed her records in epic, patient reports that she has had an increase in headaches, the location varies, it can sometimes be sharp or dull, she does have photophobia and phonophobia with the headaches, denies vomiting, Tylenol helps intermittently, she also has sumatriptan in the past she has been on Zonegran.  I reviewed other notes in epic to look for further history, she was seen in the emergency room earlier this year for right facial numbness and difficulty with speech, at the time she did not have any headaches, she reported change in vision of the right eye but also reported history of visual complaints thought to be secondary to her history of migraines.  A code stroke was called, negative CT, also emergent MRI, thought to be a "complicated migraine", treated with a headache cocktail symptoms resolved and patient was discharged home.  Migraines started at the age of 61. Waxed and wained for many years. Dr. Brett Fairy tried Duard Brady, she would have 203 a month esp around her period. They completely changed about a year ago she started having auras, blurry vision,  She was diagnosed with ocular migraine. Can be unilateral, or in the whole head or in the back, can be anywhere all over the place, pounding/pulsating/throbbing, nausea, photophobia/phonopobia, she has numbness in the face, they are severe and she has been to the hospital, she can wake up with a headache, they can last 24 hours or longer, she can have a headache every day as well as migraines, she is pending a sleep study for snoring, 8 migraine days a month. Ongoing for the last year. She just had  glasses. Also nausea with the headaches. She is going back to the eye doctor. Here with husband who also provides information. +anxiety. No other focal neurologic deficits, associated symptoms, inciting events or modifiable factors.   Reviewed notes, labs and imaging from outside physicians, which showed:   From a review of records medications tried in the past that can be used in headache and migraine management include: Voltaren tablet, Flexeril: Diltiazem (calcium channel blocker), Lexapro, Relpax, Prozac, gabapentin, zonisamide, imipramine, Lamictal, magnesium oral, Mobic, Medrol Dosepaks and Depo-Medrol injections, Reglan, metoprolol, Zofran, Phenergan, propranolol, Zoloft, sumatriptan.   I reviewed MRI of the brain images from May 2021 which appears normal to me.  Reviewed report, impression "negative exam".  BMP September 2021 unremarkable slightly elevated calcium 10.4.  TSH in May 2021 normal 1.02 CBC June 02, 2020 normal.  Review of Systems: Patient complains of symptoms per HPI as well as the following symptoms:anxiety. Pertinent negatives and positives per HPI. All others negative.   Social History   Socioeconomic History  . Marital status: Married    Spouse name: [name]  . Number of children: 3  . Years of education: College  . Highest education level: Not on file  Occupational History    Employer: OTHER  Tobacco Use  . Smoking status: Never Smoker  . Smokeless tobacco: Never Used  Vaping Use  . Vaping Use: Never used  Substance and Sexual Activity  . Alcohol use: No  . Drug use: No  . Sexual activity: Yes    Partners: Male  Birth control/protection: None  Other Topics Concern  . Not on file  Social History Narrative   Patient is married ([name]) and lives at home with her family   4 adults - 2 kids 50% of the time (1 son, 1 daughter). 2 older children (one age 17 from rape, then 10 year old- does not get to see- related to bipolar) Moved in with in laws to  help them      Patient has 4 children (3 of her own and 1 step son).    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   Social Determinants of Health   Financial Resource Strain:   . Difficulty of Paying Living Expenses: Not on file  Food Insecurity:   . Worried About Charity fundraiser in the Last Year: Not on file  . Ran Out of Food in the Last Year: Not on file  Transportation Needs:   . Lack of Transportation (Medical): Not on file  . Lack of Transportation (Non-Medical): Not on file  Physical Activity:   . Days of Exercise per Week: Not on file  . Minutes of Exercise per Session: Not on file  Stress:   . Feeling of Stress : Not on file  Social Connections:   . Frequency of Communication with Friends and Family: Not on file  . Frequency of Social Gatherings with Friends and Family: Not on file  . Attends Religious Services: Not on file  . Active Member of Clubs or Organizations: Not on file  . Attends Archivist Meetings: Not on file  . Marital Status: Not on file  Intimate Partner Violence:   . Fear of Current or Ex-Partner: Not on file  . Emotionally Abused: Not on file  . Physically Abused: Not on file  . Sexually Abused: Not on file    Family History  Problem Relation Age of Onset  . Mental illness Mother        does not speak to regularly so does not know full history  . Migraines Mother   . Other Father        medical issues, divorce related  . Other Brother        ptsd- fully discharged medically disabled before age 24  . Heart murmur Brother   . Headache Brother        cluster  . Cancer Paternal Grandfather        type unknown  . Migraines Other     Past Medical History:  Diagnosis Date  . Anxiety   . Arthritis   . Asthma   . Bipolar disorder with depression ([place])    and PTSD  . Chicken pox   .  Chronic migraine w/o aura, not intractable, w stat migr   . Depression   . Deviated septum   . DJD (degenerative joint disease)    Severe of the right knee-Dr Creighton-ortho and Dr Gean Birchwood, PA-preferred pain managment  . GERD (gastroesophageal reflux disease)   . Narcolepsy   . PTSD (post-traumatic stress disorder)   . PVC (premature ventricular contraction)   . Seasonal allergic rhinitis     Patient Active Problem List   Diagnosis Date Noted  . Chronic migraine w/o aura w/o status migrainosus, not intractable 08/30/2020  . Primary narcolepsy without cataplexy  08/25/2020  . Other headache syndrome 08/25/2020  . Hypoxemia associated with sleep 08/25/2020  . Fibromyalgia 08/09/2020  . Erroneous encounter - disregard 04/08/2020  . Symptomatic PVCs 02/26/2020  . Atypical chest pain 10/06/2019  . Sinus tachycardia 08/27/2019  . Shortness of breath 08/27/2019  . Recurrent isolated sleep paralysis 07/02/2018  . Menstrual migraine without status migrainosus, not intractable 01/02/2018  . Chronic thumb pain, bilateral 11/23/2017  . Elevated TSH 05/07/2017  . Trigger thumb, left thumb 03/15/2017  . Chronic post-traumatic stress disorder (PTSD) 02/26/2017  . Sensorineural hearing loss (SNHL) of both ears 12/12/2016  . Asthma 10/27/2016  . GERD (gastroesophageal reflux disease) 10/27/2016  . Seasonal allergic rhinitis 05/02/2016  . Right ear pain 05/02/2016  . Dysfunction of both eustachian tubes 05/02/2016  . Migraine with aura and without status migrainosus, not intractable 10/19/2015  . Narcolepsy cataplexy syndrome 03/06/2014  . Bipolar II disorder ([place]) 12/31/2013  . Chondromalacia of knee 03/07/2013    Past Surgical History:  Procedure Laterality Date  . knee surgery x5     08,11, 12, 13 x2- R knee  . NASAL TURBINATE REDUCTION Bilateral 06/2020  . right foot surgery     x 3, arthritis/bone spurus. plate placed.   . WISDOM TOOTH EXTRACTION      Current Outpatient  Medications  Medication Sig Dispense Refill  . ALPRAZolam (XANAX) 0.5 MG tablet Take 0.5 mg by mouth as needed for anxiety.     . Armodafinil 150 MG tablet TAKE ONE TABLET BY MOUTH DAILY (Patient taking differently: as needed. ) 90 tablet 0  . cyclobenzaprine (FLEXERIL) 10 MG tablet Take 10 mg by mouth as needed.    . desloratadine (CLARINEX) 5 MG tablet Take 5 mg by mouth daily.    Marland Kitchen EPIPEN 2-PAK 0.3 MG/0.3ML SOAJ injection as needed.    . fluticasone (FLONASE SENSIMIST) 27.5 MCG/SPRAY nasal spray Place 2 sprays into the nose daily.    Marland Kitchen gabapentin (NEURONTIN) 300 MG capsule Take 600 mg by mouth 3 (three) times daily. Morning, Noon and 4 pm    . lamoTRIgine (LAMICTAL) 200 MG tablet Take 400 mg by mouth at bedtime.    . levalbuterol (XOPENEX HFA) 45 MCG/ACT inhaler Inhale 1-2 puffs into the lungs every 6 (six) hours as needed for wheezing (if having issues with high heart rate on albuterol). 1 Inhaler 5  . loratadine (CLARITIN) 10 MG tablet Take 10 mg by mouth daily.     . metoprolol succinate (TOPROL XL) 25 MG 24 hr tablet Take 1 tablet (25 mg total) by mouth 2 (two) times daily. 60 tablet 3  . metroNIDAZOLE (METROGEL) 0.75 % gel Apply 1 application topically 2 (two) times daily. 45 g 0  . Multiple Vitamins-Minerals (HAIR SKIN AND NAILS FORMULA PO) Take 1 tablet by mouth daily.     . naltrexone (DEPADE) 50 MG tablet Take 25 mg by mouth daily. BID    . olopatadine (PATANOL) 0.1 % ophthalmic solution     . ondansetron (ZOFRAN-ODT) 4 MG disintegrating tablet DISSOLVE ONE TABLET BY MOUTH EVERY 8 HOURS AS NEEDED FOR NAUSEA/ VOMITING 18 tablet 0  . pantoprazole (PROTONIX) 40 MG tablet TAKE 1 TABLET BY MOUTH EVERY DAY 30 tablet 0  . promethazine (PHENERGAN) 25 MG tablet TAKE ONE TABLET BY MOUTH EVERY 6 HOURS AS NEEDED FOR NAUSEA/ VOMITING 90 tablet 0  . pyridOXINE (VITAMIN B-6) 50 MG tablet Take 50 mg by mouth in the morning and at bedtime.    . Sodium Oxybate 500  MG/ML SOLN Take 3.5gms at bedtime.  Take 4.5gms 2 hours later. Total of 8gms per night. Dispense 357ml. 360 mL 5  . vitamin B-12 (CYANOCOBALAMIN) 1000 MCG tablet Take 1,000 mcg by mouth in the morning and at bedtime.    . Galcanezumab-gnlm (EMGALITY) 120 MG/ML SOAJ Inject 120 mg into the skin every 30 (thirty) days. 1.12 mL 11  . Rimegepant Sulfate (NURTEC) 75 MG TBDP Take 75 mg by mouth daily as needed. For migraines. Take as close to onset of migraine as possible. One daily maximum. 10 tablet 6   No current facility-administered medications for this visit.    Allergies as of 08/30/2020 - Review Complete 08/30/2020  Allergen Reaction Noted  . Klonopin [clonazepam] Nausea Only 12/19/2013  . Phenergan [promethazine hcl]  12/26/2011  . Promethazine Other (See Comments) 03/07/2013  . Promethazine hcl Other (See Comments) 12/26/2011  . Topamax [topiramate] Nausea And Vomiting 12/19/2013    Vitals: BP (!) 136/94 (BP Location: Left Arm, Patient Position: Sitting)   Pulse 89   Ht 5' 1.5" (1.562 m)   Wt 163 lb (73.9 kg)   BMI 30.30 kg/m  Last Weight:  Wt Readings from Last 1 Encounters:  08/30/20 163 lb (73.9 kg)   Last Height:   Ht Readings from Last 1 Encounters:  08/30/20 5' 1.5" (1.562 m)     Physical exam: Exam: Gen: anxious, well groomed                      Neuro: Detailed Neurologic Exam  Speech:    Speech is normal; fluent and spontaneous with normal comprehension.  Cognition:    The patient is oriented to person, place, and time;     recent and remote memory intact;     language fluent;     normal attention, concentration,     fund of knowledge Cranial Nerves:    The pupils are equal, round, and reactive to light. The fundi areflat. Visual fields are full to finger confrontation. Extraocular movements are intact. Trigeminal sensation is intact and the muscles of mastication are normal. The face is symmetric. The palate elevates in the midline. Hearing intact. Voice is normal. Shoulder shrug is  normal. The tongue has normal motion without fasciculations.   Coordination:    No dysmetria or ataxia Gait:   Normal native gait  Motor Observation:    No asymmetry, no atrophy, and no involuntary movements noted. Tone:    Normal muscle tone.    Posture:    Posture is normal. normal erect    Strength:    Strength is V/V in the upper and lower limbs.      Sensation: intact       Assessment/Plan:  39 year old with migraines with and without aura, chronic. MRI of the brain was normal.   Triptans contraindicated in patient due to basilar migraines: try Nurtec Prevention: Start Emgality  Discussed increased risk of strokes in patients with migraine with aura Significant anxiety, treating this or seeing a therapist will help  To prevent or relieve headaches, try the following: Cool Compress. Lie down and place a cool compress on your head.  Avoid headache triggers. If certain foods or odors seem to have triggered your migraines in the past, avoid them. A headache diary might help you identify triggers.  Include physical activity in your daily routine. Try a daily walk or other moderate aerobic exercise.  Manage stress. Find healthy ways to cope with the stressors, such as  delegating tasks on your to-do list.  Practice relaxation techniques. Try deep breathing, yoga, massage and visualization.  Eat regularly. Eating regularly scheduled meals and maintaining a healthy diet might help prevent headaches. Also, drink plenty of fluids.  Follow a regular sleep schedule. Sleep deprivation might contribute to headaches Consider biofeedback. With this mind-body technique, you learn to control certain bodily functions -- such as muscle tension, heart rate and blood pressure -- to prevent headaches or reduce headache pain.    Proceed to emergency room if you experience new or worsening symptoms or symptoms do not resolve, if you have new neurologic symptoms or if headache is severe, or for any  concerning symptom.   Provided education and documentation from American headache Society toolbox including articles on: chronic migraine medication overuse headache, chronic migraines, prevention of migraines, behavioral and other nonpharmacologic treatments for headache.    No orders of the defined types were placed in this encounter.  Meds ordered this encounter  Medications  . Rimegepant Sulfate (NURTEC) 75 MG TBDP    Sig: Take 75 mg by mouth daily as needed. For migraines. Take as close to onset of migraine as possible. One daily maximum.    Dispense:  10 tablet    Refill:  6    Patient has copay card; she can have medication for $5 regardless of insurance approval or copay amount.  . Galcanezumab-gnlm (EMGALITY) 120 MG/ML SOAJ    Sig: Inject 120 mg into the skin every 30 (thirty) days.    Dispense:  1.12 mL    Refill:  11    Cc: Dohmeier, Asencion Partridge, MD,  Marin Olp, MD  Sarina Ill, MD  [facility] [address] [place], [place] [address]  Phone [phone] Fax [phone]   I spent over 45  minutes of face-to-face and non-face-to-face time with patient on the  1. Chronic migraine w/o aura w/o status migrainosus, not intractable   2. Migraine with aura and without status migrainosus, not intractable    diagnosis.  This included previsit chart review, lab review, study review, order entry, electronic health record documentation, patient education on the different diagnostic and therapeutic options, counseling and coordination of care, risks and benefits of management, compliance, or risk factor reduction

## 2020-08-30 NOTE — Telephone Encounter (Signed)
Refill sent in

## 2020-08-30 NOTE — Addendum Note (Signed)
Addended by: Clyde Lundborg A on: 08/30/2020 11:17 AM   Modules accepted: Orders

## 2020-08-30 NOTE — Patient Instructions (Addendum)
Emgality as prevention Nurtec as needed for migraine   There is increased risk for stroke in women with migraine with aura and a contraindication for the combined contraceptive pill for use by women who have migraine with aura. The risk for women with migraine without aura is lower. However other risk factors like smoking are far more likely to increase stroke risk than migraine. There is a recommendation for no smoking and for the use of OCPs without estrogen such as progestogen only pills particularly for women with migraine with aura.Marland Kitchen People who have migraine headaches with auras may be 3 times more likely to have a stroke caused by a blood clot, compared to migraine patients who don't see auras. Women who take hormone-replacement therapy may be 30 percent more likely to suffer a clot-based stroke than women not taking medication containing estrogen. Other risk factors like smoking and high blood pressure may be  much more important.  Rimegepant oral dissolving tablet What is this medicine? RIMEGEPANT (ri ME je pant) is used to treat migraine headaches with or without aura. An aura is a strange feeling or visual disturbance that warns you of an attack. It is not used to prevent migraines. This medicine may be used for other purposes; ask your health care provider or pharmacist if you have questions. COMMON BRAND NAME(S): NURTEC ODT What should I tell my health care provider before I take this medicine? They need to know if you have any of these conditions:  kidney disease  liver disease  an unusual or allergic reaction to rimegepant, other medicines, foods, dyes, or preservatives  pregnant or trying to get pregnant  breast-feeding How should I use this medicine? Take the medicine by mouth. Follow the directions on the prescription label. Leave the tablet in the sealed blister pack until you are ready to take it. With dry hands, open the blister and gently remove the tablet. If the tablet  breaks or crumbles, throw it away and take a new tablet out of the blister pack. Place the tablet in the mouth and allow it to dissolve, and then swallow. Do not cut, crush, or chew this medicine. You do not need water to take this medicine. Talk to your pediatrician about the use of this medicine in children. Special care may be needed. Overdosage: If you think you have taken too much of this medicine contact a poison control center or emergency room at once. NOTE: This medicine is only for you. Do not share this medicine with others. What if I miss a dose? This does not apply. This medicine is not for regular use. What may interact with this medicine? This medicine may interact with the following medications:  certain medicines for fungal infections like fluconazole, itraconazole  rifampin This list may not describe all possible interactions. Give your health care provider a list of all the medicines, herbs, non-prescription drugs, or dietary supplements you use. Also tell them if you smoke, drink alcohol, or use illegal drugs. Some items may interact with your medicine. What should I watch for while using this medicine? Visit your health care professional for regular checks on your progress. Tell your health care professional if your symptoms do not start to get better or if they get worse. What side effects may I notice from receiving this medicine? Side effects that you should report to your doctor or health care professional as soon as possible:  allergic reactions like skin rash, itching or hives; swelling of the face, lips, or  tongue Side effects that usually do not require medical attention (report these to your doctor or health care professional if they continue or are bothersome):  nausea This list may not describe all possible side effects. Call your doctor for medical advice about side effects. You may report side effects to FDA at 1-800-FDA-1088. Where should I keep my  medicine? Keep out of the reach of children. Store at room temperature between 15 and 30 degrees C (59 and 86 degrees F). Throw away any unused medicine after the expiration date. NOTE: This sheet is a summary. It may not cover all possible information. If you have questions about this medicine, talk to your doctor, pharmacist, or health care provider.  2020 Elsevier/Gold Standard (2019-01-20 00:21:31) Galcanezumab injection What is this medicine? GALCANEZUMAB (gal ka NEZ ue mab) is used to prevent migraines and treat cluster headaches. This medicine may be used for other purposes; ask your health care provider or pharmacist if you have questions. COMMON BRAND NAME(S): Emgality What should I tell my health care provider before I take this medicine? They need to know if you have any of these conditions:  an unusual or allergic reaction to galcanezumab, other medicines, foods, dyes, or preservatives  pregnant or trying to get pregnant  breast-feeding How should I use this medicine? This medicine is for injection under the skin. You will be taught how to prepare and give this medicine. Use exactly as directed. Take your medicine at regular intervals. Do not take your medicine more often than directed. It is important that you put your used needles and syringes in a special sharps container. Do not put them in a trash can. If you do not have a sharps container, call your pharmacist or healthcare provider to get one. Talk to your pediatrician regarding the use of this medicine in children. Special care may be needed. Overdosage: If you think you have taken too much of this medicine contact a poison control center or emergency room at once. NOTE: This medicine is only for you. Do not share this medicine with others. What if I miss a dose? If you miss a dose, take it as soon as you can. If it is almost time for your next dose, take only that dose. Do not take double or extra doses. What may  interact with this medicine? Interactions are not expected. This list may not describe all possible interactions. Give your health care provider a list of all the medicines, herbs, non-prescription drugs, or dietary supplements you use. Also tell them if you smoke, drink alcohol, or use illegal drugs. Some items may interact with your medicine. What should I watch for while using this medicine? Tell your doctor or healthcare professional if your symptoms do not start to get better or if they get worse. What side effects may I notice from receiving this medicine? Side effects that you should report to your doctor or health care professional as soon as possible:  allergic reactions like skin rash, itching or hives, swelling of the face, lips, or tongue Side effects that usually do not require medical attention (report these to your doctor or health care professional if they continue or are bothersome):  pain, redness, or irritation at site where injected This list may not describe all possible side effects. Call your doctor for medical advice about side effects. You may report side effects to FDA at 1-800-FDA-1088. Where should I keep my medicine? Keep out of the reach of children. You will be instructed  on how to store this medicine. Throw away any unused medicine after the expiration date on the label. NOTE: This sheet is a summary. It may not cover all possible information. If you have questions about this medicine, talk to your doctor, pharmacist, or health care provider.  2020 Elsevier/Gold Standard (2018-04-24 12:03:23)

## 2020-08-31 ENCOUNTER — Other Ambulatory Visit: Payer: Self-pay | Admitting: Neurology

## 2020-08-31 ENCOUNTER — Other Ambulatory Visit: Payer: Self-pay

## 2020-08-31 ENCOUNTER — Encounter: Payer: BC Managed Care – PPO | Admitting: Gastroenterology

## 2020-08-31 ENCOUNTER — Ambulatory Visit (INDEPENDENT_AMBULATORY_CARE_PROVIDER_SITE_OTHER): Payer: BLUE CROSS/BLUE SHIELD | Admitting: Rehabilitative and Restorative Service Providers"

## 2020-08-31 DIAGNOSIS — R29818 Other symptoms and signs involving the nervous system: Secondary | ICD-10-CM

## 2020-08-31 DIAGNOSIS — H8111 Benign paroxysmal vertigo, right ear: Secondary | ICD-10-CM | POA: Diagnosis not present

## 2020-08-31 DIAGNOSIS — R42 Dizziness and giddiness: Secondary | ICD-10-CM | POA: Diagnosis not present

## 2020-08-31 MED ORDER — ONDANSETRON 4 MG PO TBDP
ORAL_TABLET | ORAL | 6 refills | Status: DC
Start: 2020-08-31 — End: 2020-10-24

## 2020-08-31 NOTE — Patient Instructions (Signed)
Access Code: [email or web address] URL: [email or web address] Date: 08/31/2020 Prepared by: Rudell Cobb  Program Notes Do not allow symptoms to go above a 4/10.   Stop and rest in between each exercise to let symptoms return to baseline.   Exercises should not provoke dizziness or visual changes that last > 15 minutes after you complete them.  If they bring on > intensity dizziness that lasts longer, reduce repetitions and spread exercises out during the day.    Exercises Seated Gaze Stabilization with Head Rotation - 2 x daily - 7 x weekly - 1 sets - 15 reps Seated Gaze Stabilization with Head Nod - 2 x daily - 7 x weekly - 1 sets - 15 reps Standing Horizontal Head Rotation Vestibular Habituation - 2 x daily - 7 x weekly - 1 sets - 5 reps Standing with Head Nod - 2 x daily - 7 x weekly - 1 sets - 5 reps Turning in Corner 360 - 2 x daily - 7 x weekly - 1 sets - 3 reps Seated to Fold Over Vestibular Habituation - 2 x daily - 7 x weekly - 1 sets - 5 reps

## 2020-08-31 NOTE — Therapy (Signed)
[place], [place], [address] Phone: [phone]   Fax:  [phone]  Physical Therapy Treatment  Patient Details  Name: Debra Nelson MRN: [id number] Date of Birth: 1981/01/23 Referring Provider (PT): Garret Reddish, MD   Encounter Date: 08/31/2020   PT End of Session - 08/31/20 1053    Visit Number 2    Number of Visits 6    Date for PT Re-Evaluation 10/06/20    PT Start Time 1016    PT Stop Time 1054    PT Time Calculation (min) 38 min    Activity Tolerance Patient tolerated treatment well    Behavior During Therapy [facility] for tasks assessed/performed           Past Medical History:  Diagnosis Date  . Anxiety   . Arthritis   . Asthma   . Bipolar disorder with depression ([place])    and PTSD  . Chicken pox   . Chronic migraine w/o aura, not intractable, w stat migr   . Depression   . Deviated septum   . DJD (degenerative joint disease)    Severe of the right knee-Dr Creighton-ortho and Dr Gean Birchwood, PA-preferred pain managment  . GERD (gastroesophageal reflux disease)   . Narcolepsy   . PTSD (post-traumatic stress disorder)   . PVC (premature ventricular contraction)   . Seasonal allergic rhinitis     Past Surgical History:  Procedure Laterality Date  . knee surgery x5     08,11, 12, 13 x2- R knee  . NASAL TURBINATE REDUCTION Bilateral 06/2020  . right foot surgery     x 3, arthritis/bone spurus. plate placed.   . WISDOM TOOTH EXTRACTION      There were no vitals filed for this visit.   Subjective Assessment - 08/31/20 1020    Subjective "Does this affect eye sight as well?"  Patient feels like a sensation of eye pulling to the right.  She is trying to get used to new glasses and feels like things feel worse with reading.  The patient got injection for her migraine and felt improvement today.    Pertinent History Nasal synergy a month ago, migraines (has recently changed  for ocular migraines and complex migraines with tingling), asthma, environment/seasonal allergies, bipolar, PTSD, anxiety,    Patient Stated Goals Return to driving.    Currently in Pain? No/denies              [facility] PT Assessment - 08/31/20 1022      Assessment   Medical Diagnosis BPPV    Referring Provider (PT) Garret Reddish, MD    Onset Date/Surgical Date 08/20/20               Vestibular Assessment - 08/31/20 1022      Vestibular Assessment   General Observation She has not noticed any double vision.  Dizziness continues to be intermittent and spontaneous.  She has occasional blurry vision.  She is getting dizziness daily lasting for short duration.  Happens at times with movement and other times randomly.        Positional Testing   Sidelying Test Sidelying Right;Sidelying Left      Sidelying Right   Sidelying Right Duration none    Sidelying Right Symptoms No nystagmus      Positional Sensitivities   Head Turning x 5 --   3/10   Head Nodding x 5 --   3/10  [facility] Adult PT Treatment/Exercise - 08/31/20 1035      Self-Care   Self-Care Other Self-Care Comments    Other Self-Care Comments  discussed symptom management with vestibular exercises recommending symptoms remain at 4/10 or less and to reduce reps if exercise provokes migraines           Vestibular Treatment/Exercise - 08/31/20 1026      Vestibular Treatment/Exercise   Vestibular Treatment Provided Habituation;Gaze    Habituation Exercises Seated Horizontal Head Turns;Seated Vertical Head Turns;Standing Horizontal Head Turns;Standing Vertical Head Turns;180 degree Turns;Seated Diagonal Head Turns    Gaze Exercises X1 Viewing Horizontal;X1 Viewing Vertical      Seated Horizontal Head Turns   Number of Reps  5    Symptom Description  3/10 dizziness      Seated Vertical Head Turns   Number of Reps  5    Symptom Description  3/10 dizziness      Seated Diagonal Head  Turns   Number of Reps 5    Symptoms Description  seated nose to knee x 5 reps 3/10 to each side      Standing Horizontal Head Turns   Number of Reps  5    Symptom Description  5/10      Standing Vertical Head Turns   Number of Reps  5    Symptom Description  5/10      180 degree Turns   Number of Reps  3    Symptom Description  5/10      X1 Viewing Horizontal   Foot Position seated    Comments 5/10 with 15 reps      X1 Viewing Vertical   Foot Position seated    Comments 4/10 with 15 reps and some nausea                      PT Long Term Goals - 08/25/20 1136      PT LONG TERM GOAL #1   Title The patient will be indep with HEP for gaze adaptation, standing balance.    Time 6    Period Weeks    Target Date 10/06/20      PT LONG TERM GOAL #2   Title The patient will have no dizziness with sit<>bilateral sidelying indicating resolution of BPPV.    Time 6    Period Weeks    Target Date 10/06/20      PT LONG TERM GOAL #3   Title The patient will imrpove functional outcomes score from 50% limitation to 22% limitation.    Time 6    Period Weeks    Target Date 10/06/20                 Plan - 08/31/20 1059    Clinical Impression Statement The patient is progressing well with exercise and tolerated greater gaze adaptation and habituation today.  She did not have positional symptoms to treat.  Plan to progress to tolerance working to [place].    Comorbidities migraines    PT Frequency 1x / week    PT Duration 6 weeks    PT Treatment/Interventions ADLs/Self Care Home Management;Vestibular;Canalith Repostioning;Neuromuscular re-education;Therapeutic activities;Gait training;Patient/family education;Manual lymph drainage    PT Next Visit Plan *Teach self tx for BPPV, Check VOR x 1 viewing, reassess positional symtpoms, discuss lenses for functional mobility, motion sensitivity HEP    Consulted and Agree with Plan of Care Patient           Patient  will  benefit from skilled therapeutic intervention in order to improve the following deficits and impairments:  Dizziness, Decreased activity tolerance  Visit Diagnosis: BPPV (benign paroxysmal positional vertigo), right  Dizziness and giddiness  Other symptoms and signs involving the nervous system     Problem List Patient Active Problem List   Diagnosis Date Noted  . Chronic migraine w/o aura w/o status migrainosus, not intractable 08/30/2020  . Primary narcolepsy without cataplexy 08/25/2020  . Other headache syndrome 08/25/2020  . Hypoxemia associated with sleep 08/25/2020  . Fibromyalgia 08/09/2020  . Erroneous encounter - disregard 04/08/2020  . Symptomatic PVCs 02/26/2020  . Atypical chest pain 10/06/2019  . Sinus tachycardia 08/27/2019  . Shortness of breath 08/27/2019  . Recurrent isolated sleep paralysis 07/02/2018  . Menstrual migraine without status migrainosus, not intractable 01/02/2018  . Chronic thumb pain, bilateral 11/23/2017  . Elevated TSH 05/07/2017  . Trigger thumb, left thumb 03/15/2017  . Chronic post-traumatic stress disorder (PTSD) 02/26/2017  . Sensorineural hearing loss (SNHL) of both ears 12/12/2016  . Asthma 10/27/2016  . GERD (gastroesophageal reflux disease) 10/27/2016  . Seasonal allergic rhinitis 05/02/2016  . Right ear pain 05/02/2016  . Dysfunction of both eustachian tubes 05/02/2016  . Migraine with aura and without status migrainosus, not intractable 10/19/2015  . Narcolepsy cataplexy syndrome 03/06/2014  . Bipolar II disorder ([place]) 12/31/2013  . Chondromalacia of knee 03/07/2013    Harlon Kutner, PT 08/31/2020, 11:00 AM  [facility] [place], [place], [address] Phone: [phone]   Fax:  [phone]  Name: Debra Nelson MRN: [id number] Date of Birth: Apr 20, 1981

## 2020-09-03 ENCOUNTER — Telehealth: Payer: Self-pay

## 2020-09-03 NOTE — Telephone Encounter (Signed)
Patient called in this morning to let Dr.Hunter know that she was prescribed metroNIDAZOLE (METROGEL) 0.75 % gel, and when taking it she noticed that she would feel tingling within her hands and feet. Wanted to know if she needs to stop the medication.

## 2020-09-03 NOTE — Telephone Encounter (Signed)
Called and spoke with pt and gave her the below message.

## 2020-09-03 NOTE — Telephone Encounter (Signed)
If this is a significant symptom-she may stop the medication.  If very mild and short-lived may continue

## 2020-09-05 ENCOUNTER — Encounter: Payer: Self-pay | Admitting: Family Medicine

## 2020-09-07 ENCOUNTER — Ambulatory Visit: Payer: BLUE CROSS/BLUE SHIELD | Admitting: Rehabilitative and Restorative Service Providers"

## 2020-09-07 ENCOUNTER — Telehealth: Payer: Self-pay | Admitting: *Deleted

## 2020-09-07 ENCOUNTER — Other Ambulatory Visit: Payer: Self-pay

## 2020-09-07 DIAGNOSIS — R29818 Other symptoms and signs involving the nervous system: Secondary | ICD-10-CM

## 2020-09-07 DIAGNOSIS — H8111 Benign paroxysmal vertigo, right ear: Secondary | ICD-10-CM | POA: Diagnosis not present

## 2020-09-07 DIAGNOSIS — R42 Dizziness and giddiness: Secondary | ICD-10-CM | POA: Diagnosis not present

## 2020-09-07 NOTE — Telephone Encounter (Signed)
Completed [employer] PA on Cover My Meds. Key: [id number]. Approved by [facility].   CaseId: [id number]; Status:Approved; Review Type:Prior Auth; Coverage Start Date:08/08/2020; Coverage End Date:09/07/2021;

## 2020-09-07 NOTE — Therapy (Addendum)
[place], [place], [address] Phone: [phone]   Fax:  [phone]  Physical Therapy Treatment and Discharge Summary  Patient Details  Name: Debra Nelson MRN: [id number] Date of Birth: [birth year] Referring Provider (PT): Garret Reddish, MD   Encounter Date: 09/07/2020   PT End of Session - 09/07/20 1113    Visit Number 3    Number of Visits 6    Date for PT Re-Evaluation 10/06/20    PT Start Time 1104    PT Stop Time 1130    PT Time Calculation (min) 26 min    Activity Tolerance Patient tolerated treatment well    Behavior During Therapy [facility] for tasks assessed/performed           Past Medical History:  Diagnosis Date  . Anxiety   . Arthritis   . Asthma   . Bipolar disorder with depression ([place])    and PTSD  . Chicken pox   . Chronic migraine w/o aura, not intractable, w stat migr   . Depression   . Deviated septum   . DJD (degenerative joint disease)    Severe of the right knee-Dr Creighton-ortho and Dr Gean Birchwood, PA-preferred pain managment  . GERD (gastroesophageal reflux disease)   . Narcolepsy   . PTSD (post-traumatic stress disorder)   . PVC (premature ventricular contraction)   . Seasonal allergic rhinitis     Past Surgical History:  Procedure Laterality Date  . knee surgery x5     08,11, 12, 13 x2- R knee  . NASAL TURBINATE REDUCTION Bilateral 06/2020  . right foot surgery     x 3, arthritis/bone spurus. plate placed.   . WISDOM TOOTH EXTRACTION      There were no vitals filed for this visit.   Subjective Assessment - 09/07/20 1108    Subjective The patient reports more good days than bad days.  She saw her eye doctor and got a new prescription.  She woke with dizziness Sunday and dizziness remained for a portion of the day (lessens in severity as the day goes on).  Exercises bring on symptoms that settle within minutes.    Pertinent History Nasal synergy a  month ago, migraines (has recently changed for ocular migraines and complex migraines with tingling), asthma, environment/seasonal allergies, bipolar, PTSD, anxiety,    Patient Stated Goals Return to driving.    Currently in Pain? No/denies              [facility] PT Assessment - 09/07/20 1115      Assessment   Medical Diagnosis BPPV    Referring Provider (PT) Garret Reddish, MD    Onset Date/Surgical Date 08/20/20                         [facility] Adult PT Treatment/Exercise - 09/07/20 1115      Self-Care   Self-Care Other Self-Care Comments    Other Self-Care Comments  Discussed returning to driving on good days (in parking lot with family) to begin to work through; discussed self treatment of home epley's maneuver due to intermittent positional symptoms.      Exercises   Exercises Neck      Neck Exercises: Seated   Lateral Flexion Right;Left    Other Seated Exercise levator stretch bilaterally x 15 second holds           Vestibular Treatment/Exercise - 09/07/20 1115      Vestibular Treatment/Exercise  Vestibular Treatment Provided Habituation;Gaze    Habituation Exercises Standing Horizontal Head Turns;180 degree Turns    Gaze Exercises X1 Viewing Vertical;X1 Viewing Horizontal       EPLEY MANUEVER RIGHT   Response Details  *discussed for home/ self mgmt of BPPV      Standing Horizontal Head Turns   Number of Reps  5      Standing Vertical Head Turns   Number of Reps  5      180 degree Turns   Number of Reps  3      X1 Viewing Horizontal   Foot Position standing    Comments x 30 seconds                      PT Long Term Goals - 09/07/20 1131      PT LONG TERM GOAL #1   Title The patient will be indep with HEP for gaze adaptation, standing balance.    Time 6    Period Weeks    Status On-going      PT LONG TERM GOAL #2   Title The patient will have no dizziness with sit<>bilateral sidelying indicating resolution of BPPV.    Time  6    Period Weeks    Status Achieved      PT LONG TERM GOAL #3   Title The patient will imrpove functional outcomes score from 50% limitation to 22% limitation.    Time 6    Period Weeks    Status On-going                 Plan - 09/07/20 1132    Clinical Impression Statement The patient met one LTG without positional symptoms today.  PT provided information on self treatment of BPPV due to reports of h/o intermittent symptoms.  We also discussed when this would be indicated.  Progressed gaze to standing and recommended safe way to return to driving on good days that dizziness is not limiting her.    Comorbidities migraines    PT Frequency 1x / week    PT Duration 6 weeks    PT Treatment/Interventions ADLs/Self Care Home Management;Vestibular;Canalith Repostioning;Neuromuscular re-education;Therapeutic activities;Gait training;Patient/family education;Manual lymph drainage    PT Next Visit Plan progress HEP to tolerance, check goals and ask about driving; consider d/c planning    Consulted and Agree with Plan of Care Patient           Patient will benefit from skilled therapeutic intervention in order to improve the following deficits and impairments:  Dizziness, Decreased activity tolerance  Visit Diagnosis: BPPV (benign paroxysmal positional vertigo), right  Dizziness and giddiness  Other symptoms and signs involving the nervous system     Problem List Patient Active Problem List   Diagnosis Date Noted  . Chronic migraine w/o aura w/o status migrainosus, not intractable 08/30/2020  . Primary narcolepsy without cataplexy 08/25/2020  . Other headache syndrome 08/25/2020  . Hypoxemia associated with sleep 08/25/2020  . Fibromyalgia 08/09/2020  . Erroneous encounter - disregard 04/08/2020  . Symptomatic PVCs 02/26/2020  . Atypical chest pain 10/06/2019  . Sinus tachycardia 08/27/2019  . Shortness of breath 08/27/2019  . Recurrent isolated sleep paralysis  07/02/2018  . Menstrual migraine without status migrainosus, not intractable 01/02/2018  . Chronic thumb pain, bilateral 11/23/2017  . Elevated TSH 05/07/2017  . Trigger thumb, left thumb 03/15/2017  . Chronic post-traumatic stress disorder (PTSD) 02/26/2017  . Sensorineural hearing loss (SNHL) of both ears  12/12/2016  . Asthma 10/27/2016  . GERD (gastroesophageal reflux disease) 10/27/2016  . Seasonal allergic rhinitis 05/02/2016  . Right ear pain 05/02/2016  . Dysfunction of both eustachian tubes 05/02/2016  . Migraine with aura and without status migrainosus, not intractable 10/19/2015  . Narcolepsy cataplexy syndrome 03/06/2014  . Bipolar II disorder ([place]) 12/31/2013  . Chondromalacia of knee 03/07/2013   PHYSICAL THERAPY DISCHARGE SUMMARY  Visits from Start of Care: 3  Current functional level related to goals / functional outcomes: Patient did not return since last visit.  Goals not reassessed   Remaining deficits: See above for current status.   Education / Equipment: HEP  Plan: Patient agrees to discharge.  Patient goals were not met. Patient is being discharged due to not returning since the last visit.  ?????         Thank you for the referral of this patient. Rudell Cobb, MPT  [place], [place] 09/07/2020, 11:37 AM  [facility] [place], [place], [address] Phone: [phone]   Fax:  [phone]  Name: Debra Nelson MRN: [id number] Date of Birth: 1981/03/03

## 2020-09-07 NOTE — Patient Instructions (Signed)
Access Code: [email or web address] URL: [email or web address] Date: 09/07/2020 Prepared by: Rudell Cobb  Program Notes Do not allow symptoms to go above a 4/10.   Stop and rest in between each exercise to let symptoms return to baseline.   Exercises should not provoke dizziness or visual changes that last > 15 minutes after you complete them.  If they bring on > intensity dizziness that lasts longer, reduce repetitions and spread exercises out during the day.  DRIVING:  begin on a "good day" with family in the car and short distances or in a parking lot to begin.  As you can tolerate, increase distances and time still avoiding heavy traffic initially.    Exercises Standing Gaze Stabilization with Head Rotation - 2 x daily - 7 x weekly - 1 sets - 1 reps - 30-45 seconds hold Standing Gaze Stabilization with Head Nod - 2 x daily - 7 x weekly - 1 sets - 1 reps - 30-45 seconds hold Standing Horizontal Head Rotation Vestibular Habituation - 2 x daily - 7 x weekly - 1 sets - 5 reps Standing with Head Nod - 2 x daily - 7 x weekly - 1 sets - 5 reps Turning in Corner 360 - 2 x daily - 7 x weekly - 1 sets - 3 reps Seated to Fold Over Vestibular Habituation - 2 x daily - 7 x weekly - 1 sets - 5 reps Self-Epley Maneuver Right Ear - 2 x daily - 7 x weekly - 1 sets - 10 reps

## 2020-09-08 ENCOUNTER — Encounter: Payer: Self-pay | Admitting: Family Medicine

## 2020-09-08 ENCOUNTER — Encounter: Payer: Self-pay | Admitting: Adult Health

## 2020-09-08 ENCOUNTER — Telehealth: Payer: Self-pay | Admitting: Neurology

## 2020-09-08 NOTE — Telephone Encounter (Signed)
Spoke with pt and updated her medlist.  Last updated was PT on 09-07-20.   [facility] asking about xanax and xyrem.  She has been on both for quite sometime.  (checked drug registry (4 months now on xanax).  Taking 6 hours apart.

## 2020-09-08 NOTE — Telephone Encounter (Signed)
[facility]) called, reporting Xyrem interacting with Xanax. Request subscriber approval due to risk of over sedation and respiratory depression. Pt is aware to separate by 6 hours between  medications.

## 2020-09-08 NOTE — Telephone Encounter (Signed)
[place] drug registry checked Debra Nelson. [place] with [facility] (psych) prescribed last fill 09-03-20 #90 Xanax 0.5mg  as needed for anxiety.  (last 4 months has been xanax).  Ok to take with xyrem ?? Takes 6 hours apart?  [facility].  I updated her med list. (per other note mychart spoke with patient).

## 2020-09-09 ENCOUNTER — Encounter: Payer: Self-pay | Admitting: Adult Health

## 2020-09-09 NOTE — Telephone Encounter (Deleted)
I do not recommend xanax and xyrem

## 2020-09-09 NOTE — Telephone Encounter (Signed)
Dr. Brett Fairy are you ok with this patient taking xanax and xyrem?

## 2020-09-12 NOTE — Progress Notes (Signed)
Cardiology Office Note:    Date:  09/15/2020   ID:  Debra Nelson, Debra Nelson [birth year], MRN [id number]  PCP:  Debra Olp, MD  Cardiologist:  Debra Heinz, MD  Electrophysiologist:  None   Referring MD: Debra Olp, MD   Chief Complaint  Patient presents with  . Palpitations    History of Present Illness:    Debra Nelson is a 39 y.o. female with a hx of bipolar disorder, PTSD, asthma, GERD, migraines, narcolepsy, anxiety who presents for follow-up.  She was referred by Debra. Yong Nelson for evaluation of palpitations, initially seen on 04/16/2020.  She had been following with Debra. Virgina Nelson for cardiac evaluation.  Work-up has included ETT with low exercise capacity (4.7 METS) but no ischemic changes on EKG.  Event monitor showed occasional PVCs.  TTE on 08/24/2019 showed LVEF 55%, no significant valvular disease.  She recently had an episode of syncope and presented to the ED for evaluation.  Work-up was unremarkable, was thought to be complex migraine.  She was started on metoprolol 25 mg twice daily for symptomatic PVCs.  Previously had been on propranol and diltiazem.  Reported worsening palpitations and metoprolol dose was increased to 37.5 mg twice daily.  Zio patch x3 days on 06/08/2020 showed no significant arrhythmias and low PVC burden (less than 1% of beats).  Since last clinic visit, she reports that she is doing okay.  States that her palpitations are under good control.  Has been having issues recently with BPPV.  Improving with physical therapy.  Also recently diagnosed with fibromyalgia.  Reports occasional sharp stabbing pains on the left side of chest, not related to exertion.    Past Medical History:  Diagnosis Date  . Anxiety   . Arthritis   . Asthma   . Bipolar disorder with depression ([place])    and PTSD  . Chicken pox   . Chronic migraine w/o aura, not intractable, w stat migr   . Depression   . Deviated septum   . DJD (degenerative joint disease)     Severe of the right knee-Debra Nelson-ortho and Debra Debra Birchwood, PA-preferred pain managment  . GERD (gastroesophageal reflux disease)   . Narcolepsy   . PTSD (post-traumatic stress disorder)   . PVC (premature ventricular contraction)   . Seasonal allergic rhinitis     Past Surgical History:  Procedure Laterality Date  . knee surgery x5     08,11, 12, 13 x2- R knee  . NASAL TURBINATE REDUCTION Bilateral 06/2020  . right foot surgery     x 3, arthritis/bone spurus. plate placed.   . WISDOM TOOTH EXTRACTION      Current Medications: Current Meds  Medication Sig  . ALPRAZolam (XANAX) 0.5 MG tablet Take 0.5 mg by mouth as needed for anxiety.  . APPLE CIDER VINEGAR PO Take by mouth in the morning and at bedtime. Pt takes 1 tablet in morning and 1 tablet at night.  . Ascorbic Acid (VITAMIN C) 1000 MG tablet Take 1,000 mg by mouth daily.  . cyclobenzaprine (FLEXERIL) 10 MG tablet Take 10 mg by mouth as needed.  . desloratadine (CLARINEX) 5 MG tablet Take 5 mg by mouth every evening.   . fluticasone (FLONASE SENSIMIST) 27.5 MCG/SPRAY nasal spray Place 2 sprays into the nose daily.  Marland Kitchen gabapentin (NEURONTIN) 600 MG tablet Take 600 mg by mouth 3 (three) times daily.  . Galcanezumab-gnlm (EMGALITY) 120 MG/ML SOAJ Inject 120 mg into the skin every 30 (thirty) days.  Marland Kitchen  lamoTRIgine (LAMICTAL) 200 MG tablet Take 400 mg by mouth at bedtime.  . levalbuterol (XOPENEX HFA) 45 MCG/ACT inhaler Inhale 1-2 puffs into the lungs every 6 (six) hours as needed for wheezing (if having issues with high heart rate on albuterol).  Marland Kitchen loratadine (CLARITIN) 10 MG tablet Take 10 mg by mouth in the morning.   . magnesium oxide (MAG-OX) 400 MG tablet Take 400 mg by mouth daily.  . metoprolol succinate (TOPROL-XL) 50 MG 24 hr tablet TAKE 1/2 TABLET BY MOUTH TWICE A DAY  . Multiple Vitamins-Minerals (HAIR SKIN AND NAILS FORMULA PO) Take 1 tablet by mouth daily.   . naltrexone (DEPADE) 50 MG tablet Take 25 mg by  mouth daily. BID  . olopatadine (PATANOL) 0.1 % ophthalmic solution as needed.   . ondansetron (ZOFRAN-ODT) 4 MG disintegrating tablet DISSOLVE ONE TABLET BY MOUTH EVERY 8 HOURS AS NEEDED FOR NAUSEA/ VOMITING  . promethazine (PHENERGAN) 25 MG tablet TAKE ONE TABLET BY MOUTH EVERY 6 HOURS AS NEEDED FOR NAUSEA/ VOMITING  . pyridOXINE (VITAMIN B-6) 50 MG tablet Take 50 mg by mouth in the morning and at bedtime.  . Rimegepant Sulfate (NURTEC) 75 MG TBDP Take 75 mg by mouth daily as needed. For migraines. Take as close to onset of migraine as possible. One daily maximum.  . Sodium Oxybate 500 MG/ML SOLN Take 3.5gms at bedtime. Take 4.5gms 2 hours later. Total of 8gms per night. Dispense 343ml.  . [DISCONTINUED] pantoprazole (PROTONIX) 40 MG tablet TAKE 1 TABLET BY MOUTH EVERY DAY     Allergies:   Klonopin [clonazepam], Phenergan [promethazine hcl], Promethazine, Promethazine hcl, and Topamax [topiramate]   Social History   Socioeconomic History  . Marital status: Married    Spouse name: Debra Nelson  . Number of children: 3  . Years of education: College  . Highest education level: Not on file  Occupational History    Employer: OTHER  Tobacco Use  . Smoking status: Never Smoker  . Smokeless tobacco: Never Used  Vaping Use  . Vaping Use: Never used  Substance and Sexual Activity  . Alcohol use: No  . Drug use: No  . Sexual activity: Yes    Partners: Male    Birth control/protection: None  Other Topics Concern  . Not on file  Social History Narrative   Patient is married (Debra Nelson) and lives at home with her family   4 adults - 2 kids 50% of the time (1 son, 1 daughter). 2 older children (one age 30 from rape, then 1 year old- does not get to see- related to bipolar) Moved in with in laws to help them      Patient has 4 children (3 of her own and 1 step son).    Patient has a college education. Psychology and biology. Was paramedic.       Stay at home mom      Caffeine: most days none,  maybe an 8 oz coke but rarely    Patient is right-handed.   Social Determinants of Health   Financial Resource Strain:   . Difficulty of Paying Living Expenses: Not on file  Food Insecurity:   . Worried About Charity fundraiser in the Last Year: Not on file  . Ran Out of Food in the Last Year: Not on file  Transportation Needs:   . Lack of Transportation (Medical): Not on file  . Lack of Transportation (Non-Medical): Not on file  Physical Activity:   . Days of Exercise per  Week: Not on file  . Minutes of Exercise per Session: Not on file  Stress:   . Feeling of Stress : Not on file  Social Connections:   . Frequency of Communication with Friends and Family: Not on file  . Frequency of Social Gatherings with Friends and Family: Not on file  . Attends Religious Services: Not on file  . Active Member of Clubs or Organizations: Not on file  . Attends Archivist Meetings: Not on file  . Marital Status: Not on file     Family History: The patient's family history includes Cancer in her paternal grandfather; Headache in her brother; Heart murmur in her brother; Mental illness in her mother; Migraines in her mother and another family member; Other in her brother and father.  ROS:   Please see the history of present illness.     All other systems reviewed and are negative.  EKGs/Labs/Other Studies Reviewed:    The following studies were reviewed today:  EKG:  EKG is ordered today.  The ekg ordered today demonstrates sinus rhythm, rate 80, no PVCs  Recent Labs: 04/05/2020: ALT 18 04/16/2020: TSH 1.020 06/02/2020: Hemoglobin 13.3; Platelets 391 07/30/2020: BUN 9; Creat 0.78; Magnesium 2.0; Potassium 5.0; Sodium 141  Recent Lipid Panel    Component Value Date/Time   CHOL 170 01/30/2018 0915   TRIG 91 01/30/2018 0915   HDL 57 01/30/2018 0915   LDLCALC 95 01/30/2018 0915    Physical Exam:    VS:  BP 108/80 (BP Location: Left Arm, Patient Position: Sitting)   Pulse 80    Ht 5' 1.5" (1.562 m)   Wt 167 lb 9.6 oz (76 kg)   SpO2 99%   BMI 31.15 kg/m     Wt Readings from Last 3 Encounters:  09/15/20 167 lb 9.6 oz (76 kg)  08/30/20 163 lb (73.9 kg)  08/09/20 163 lb (73.9 kg)     GEN:  Well nourished, well developed in no acute distress HEENT: Normal NECK: No JVD CARDIAC: RRR, no murmurs, rubs, gallops RESPIRATORY:  Clear to auscultation without rales, wheezing or rhonchi  ABDOMEN: Soft, non-tender, non-distended MUSCULOSKELETAL:  No edema; No deformity  SKIN: Warm and dry NEUROLOGIC:  Alert and oriented x 3 PSYCHIATRIC:  Normal affect   ASSESSMENT:    1. PVC's (premature ventricular contractions)   2. Lipid screening   3. Palpitations    PLAN:    Palpitations/PVCs: work-up with with Debra. Virgina Nelson has included ETT with low exercise capacity (4.7 METS) but no ischemic changes on EKG.  Event monitor showed occasional PVCs.  TTE on 08/24/2019 showed LVEF 55%, no significant valvular disease.  Symptoms likely symptomatic PVCs, she was started on metoprolol 25 mg twice daily, has been titrated to 37.5 mg twice daily.  Zio patch x3 days on 06/08/2020 showed no significant arrhythmias and low PVC burden (less than 1% of beats).  Symptoms seem to correspond to PVCs, currently onToprol-XL 25 mg twice daily and reports palpitations under good control.  Will continue metoprolol.  Lipid screening: No recent lipids, will check lipid panel  RTC in 6 months  Medication Adjustments/Labs and Tests Ordered: Current medicines are reviewed at length with the patient today.  Concerns regarding medicines are outlined above.  Orders Placed This Encounter  Procedures  . Lipid panel  . EKG 12-Lead   No orders of the defined types were placed in this encounter.   Patient Instructions  Medication Instructions:  Your physician recommends that you continue on  your current medications as directed. Please refer to the Current Medication list given to you today.  *If  you need a refill on your cardiac medications before your next appointment, please call your pharmacy*   Lab Work: Please return for FASTING labs (Lipid)  Our in office lab hours are Monday-Friday 8:00-4:00, closed for lunch 12:45-1:45 pm.  No appointment needed.  Follow-Up: At [facility], you and your health needs are our priority.  As part of our continuing mission to provide you with exceptional heart care, we have created designated Provider Care Teams.  These Care Teams include your primary Cardiologist (physician) and Advanced Practice Providers (APPs -  Physician Assistants and Nurse Practitioners) who all work together to provide you with the care you need, when you need it.  We recommend signing up for the patient portal called "MyChart".  Sign up information is provided on this After Visit Summary.  MyChart is used to connect with patients for Virtual Visits (Telemedicine).  Patients are able to view lab/test results, encounter notes, upcoming appointments, etc.  Non-urgent messages can be sent to your provider as well.   To learn more about what you can do with MyChart, go to [email or web address].    Your next appointment:   6 month(s)  The format for your next appointment:   In Person  Provider:   Oswaldo Milian, MD      Signed, Debra Heinz, MD  09/15/2020 10:42 PM    [place]

## 2020-09-13 ENCOUNTER — Telehealth: Payer: Self-pay | Admitting: Neurology

## 2020-09-13 ENCOUNTER — Ambulatory Visit (INDEPENDENT_AMBULATORY_CARE_PROVIDER_SITE_OTHER): Payer: BLUE CROSS/BLUE SHIELD | Admitting: Neurology

## 2020-09-13 DIAGNOSIS — G4736 Sleep related hypoventilation in conditions classified elsewhere: Secondary | ICD-10-CM

## 2020-09-13 DIAGNOSIS — G4753 Recurrent isolated sleep paralysis: Secondary | ICD-10-CM

## 2020-09-13 DIAGNOSIS — G4710 Hypersomnia, unspecified: Secondary | ICD-10-CM | POA: Diagnosis not present

## 2020-09-13 DIAGNOSIS — G4489 Other headache syndrome: Secondary | ICD-10-CM

## 2020-09-13 DIAGNOSIS — G47419 Narcolepsy without cataplexy: Secondary | ICD-10-CM

## 2020-09-13 NOTE — Telephone Encounter (Signed)
Called the pharmacy back to advise that I discussed with Dr Brett Fairy and she is ok with the patient continuing the xyrem along with her xanax. Patient has been on this combination for years and both Dr Brett Fairy and the psychiatrist are aware of this treatment plan.  I waited for 7 min and there was no answer. LVM on their answering machine advising for them to call back. Left detailed message advising the approval for the medications can be taken together  **IF pharmacy calls back please advise Dr Brett Fairy is ok with the patient taken Xyrem and xanax.

## 2020-09-13 NOTE — Telephone Encounter (Signed)
Pt previously has been on xanax and xyrem. In June she was seeing a new psychiatrist and they swapped her to [place]. Patient has since been switched back to xanax. Dr Dohmeier was aware and ok with the patient taking the xanax and the xyrem together previously. I will discuss with Dr Brett Fairy and then contact [facility] pharmacy for the patient.

## 2020-09-13 NOTE — Telephone Encounter (Signed)
Patient's husband presented to the lobby stating wife is completely out of medications Xanax and Xyrem. Best call back [phone]

## 2020-09-13 NOTE — Telephone Encounter (Signed)
David @ [facility] called asking to be updated re:Dr Dohmeier's response to the xanax and the xyrem together.  Shanon Brow can be reached at [phone] prompt 3 then 4 for a pharmacist

## 2020-09-13 NOTE — Telephone Encounter (Addendum)
I called [facility] spoke to Wanita Chamberlain, pharmacist.  Pt takes xanax 0.5mg  po daily prn by her psychiatrist, and then her xyrem is ready to be shipped, just needed this xanax answered. Per casey's note,  I did relayed that she has been on xanax and xyrem for years, no noted increase in her anxiety.  It is ok for her to take together.  She made note of this and they will call pt to ship xyrem.  I sent mychart to pt and then spoke to her as well.

## 2020-09-13 NOTE — Telephone Encounter (Signed)
Pt's husband called stating that the pharmacy has lost the pt's medication information and they are needing an ok again for the pt to be able to continue her Xanax and her Xyrem together. Pt is almost completely out of meds. Please advise.

## 2020-09-15 ENCOUNTER — Encounter: Payer: Self-pay | Admitting: Cardiology

## 2020-09-15 ENCOUNTER — Other Ambulatory Visit: Payer: Self-pay

## 2020-09-15 ENCOUNTER — Encounter: Payer: Self-pay | Admitting: Family Medicine

## 2020-09-15 ENCOUNTER — Telehealth: Payer: Self-pay | Admitting: *Deleted

## 2020-09-15 ENCOUNTER — Other Ambulatory Visit: Payer: Self-pay | Admitting: Family Medicine

## 2020-09-15 ENCOUNTER — Ambulatory Visit: Payer: BLUE CROSS/BLUE SHIELD | Admitting: Cardiology

## 2020-09-15 VITALS — BP 108/80 | HR 80 | Ht 61.5 in | Wt 167.6 lb

## 2020-09-15 DIAGNOSIS — R002 Palpitations: Secondary | ICD-10-CM

## 2020-09-15 DIAGNOSIS — Z13220 Encounter for screening for lipoid disorders: Secondary | ICD-10-CM | POA: Diagnosis not present

## 2020-09-15 DIAGNOSIS — I493 Ventricular premature depolarization: Secondary | ICD-10-CM

## 2020-09-15 DIAGNOSIS — E785 Hyperlipidemia, unspecified: Secondary | ICD-10-CM

## 2020-09-15 DIAGNOSIS — K219 Gastro-esophageal reflux disease without esophagitis: Secondary | ICD-10-CM

## 2020-09-15 NOTE — Telephone Encounter (Signed)
Late entry-- Patient in office today to see Dr Gardiner Rhyme. After visit she was coming out of restroom, did not see step down and fell Patient was able to get up and ambulate on her own Did have some tingling/numbness in right ankle with statement being made by patient she felt like it may have sprain it, per Alden Hipp RN  Clinical supervisor made aware and safety zone done

## 2020-09-15 NOTE — Patient Instructions (Signed)
Medication Instructions:  Your physician recommends that you continue on your current medications as directed. Please refer to the Current Medication list given to you today.  *If you need a refill on your cardiac medications before your next appointment, please call your pharmacy*   Lab Work: Please return for FASTING labs (Lipid)  Our in office lab hours are Monday-Friday 8:00-4:00, closed for lunch 12:45-1:45 pm.  No appointment needed.  Follow-Up: At [facility], you and your health needs are our priority.  As part of our continuing mission to provide you with exceptional heart care, we have created designated Provider Care Teams.  These Care Teams include your primary Cardiologist (physician) and Advanced Practice Providers (APPs -  Physician Assistants and Nurse Practitioners) who all work together to provide you with the care you need, when you need it.  We recommend signing up for the patient portal called "MyChart".  Sign up information is provided on this After Visit Summary.  MyChart is used to connect with patients for Virtual Visits (Telemedicine).  Patients are able to view lab/test results, encounter notes, upcoming appointments, etc.  Non-urgent messages can be sent to your provider as well.   To learn more about what you can do with MyChart, go to [email or web address].    Your next appointment:   6 month(s)  The format for your next appointment:   In Person  Provider:   Oswaldo Milian, MD

## 2020-09-16 ENCOUNTER — Other Ambulatory Visit: Payer: BLUE CROSS/BLUE SHIELD

## 2020-09-16 ENCOUNTER — Encounter: Payer: BLUE CROSS/BLUE SHIELD | Admitting: Rehabilitative and Restorative Service Providers"

## 2020-09-16 DIAGNOSIS — E785 Hyperlipidemia, unspecified: Secondary | ICD-10-CM

## 2020-09-16 LAB — LIPID PANEL
Cholesterol: 213 mg/dL — ABNORMAL HIGH (ref <200)
HDL: 59 mg/dL (ref >=50)
LDL Cholesterol (Calc): 136 mg/dL (calc) — ABNORMAL HIGH
Non-HDL Cholesterol (Calc): 154 mg/dL (calc) — ABNORMAL HIGH (ref <130)
Total CHOL/HDL Ratio: 3.6 (calc) (ref <5.0)
Triglycerides: 79 mg/dL (ref <150)

## 2020-09-19 NOTE — Progress Notes (Signed)
Summary & Diagnosis:   This HST found no evidence of sleep hypoxia, sleep apnea or  abnormal cardiac rate variability.  No intervention from sleep medical standpoint required.   Interpreting Physician: Larey Seat, MD

## 2020-09-19 NOTE — Telephone Encounter (Signed)
Cholesterol is elevated, but no indication for statin at this time as 10 year ASCVD risk score <1%.  Recommend conservative treatment with diet/exercise

## 2020-09-19 NOTE — Procedures (Signed)
Sleep Study Report   Patient Information     First Name: Debra Last Name: Anne Nelson: [id number]  Birth Date: Nov 17, 1981 Age: 39 Gender: Female  Ward Givens, NP Marin Olp, MD BMI: 30.0 (W=163 lb, H=5' 2'')  Sleep Study Information    Study Date: 10/25-27/21 S/H/A Version: [id number] / 4.2.1023 / 79  Epworth and FSS not included   History:    [name] is a 39 year old white female with a history of narcolepsy with cataplexy.  She returned to [facility] on 07-22-2020 by Video visit and reported concern about possible low nocturnal oxygen saturations.  She reports that Xyrem and armodafinil is working well for her.  She denies any cataplectic events.  She still has a history of depression but does not feel that it is any worse.  She does have a psychiatrist. She does report that her PCP thought that she may have fibromyalgia? (I advised a referral to rheumatology by her PCP may be needed.) Patient has a consultation with Dr. Jaynee Eagles later this month for her headaches  Summary & Diagnosis:    This HST found no evidence of sleep hypoxia, sleep apnea or abnormal cardiac rate variability.  No intervention from sleep medical standpoint required.   Interpreting Physician: Larey Seat, MD  Sleep Summary  Oxygen Saturation Statistics   Start Study Time: End Study Time: Total Recording Time:  9:25:41 PM 6:38:52 AM   9 h, 13 min  Total Sleep Time % REM of Sleep Time:  6 h 39 min  15.4    Mean: 94 Minimum: 90 Maximum: 99  Mean of Desaturations Nadirs (%):   91  Oxygen Desaturation. %:   4-9 10-20 >20 Total  Events Number Total    12  1 92.3 7.7  0 0.0  13 100.0  Oxygen Saturation: <90 <=88 <85 <80 <70  Duration (minutes): Sleep % 0.0 0.0  0.0 0.0  0.0 0.0 0.0 0.0 0.0 0.0     Respiratory Indices      Total Events REM NREM All Night  pRDI: pAHI 3%: ODI 4%: pAHIc 3%: % CSR: pAHI 4%:  27  24  13  9  0.0 15 3.9 3.9 2.0 0.0 4.1 3.6 2.0 1.8  4.1 3.6 2.0 1.6 2.3       Pulse Rate Statistics during Sleep (BPM)      Mean: 77 Minimum: 62 Maximum: 110    Indices are calculated using technically valid sleep time of 6 h, 37 min.           5              15                    30       pAHI=3.6                                                         Mild              Moderate                    Severe          Body Position Statistics  Position Supine Prone Right Left Non-Supine  Sleep (min) 235.0 20.2 1.0 143.5 164.7  Sleep %  58.8 5.0 0.3 35.9 41.2  pRDI 4.6 3.1 N/A 2.1 3.3  pAHI 3% 4.1 3.1 N/A 1.7 2.9  ODI 4% 2.6 3.1 N/A 0.8 1.1               Left   Prone  Right  Supine    Snoring Statistics Snoring Level (dB) >40 >50 >60 >70 >80 >Threshold (45)  Sleep (min) 214.6 4.6 1.2 0.0 0.0 20.8  Sleep % 53.7 1.2 0.3 0.0 0.0 5.2    Mean: 41 dB

## 2020-09-20 ENCOUNTER — Encounter: Payer: Self-pay | Admitting: Neurology

## 2020-09-23 ENCOUNTER — Encounter: Payer: BLUE CROSS/BLUE SHIELD | Admitting: Rehabilitative and Restorative Service Providers"

## 2020-09-24 ENCOUNTER — Other Ambulatory Visit: Payer: Self-pay | Admitting: Cardiology

## 2020-09-24 DIAGNOSIS — H9193 Unspecified hearing loss, bilateral: Secondary | ICD-10-CM | POA: Insufficient documentation

## 2020-09-24 NOTE — Telephone Encounter (Signed)
This is a NL pt, Dr. Gardiner Rhyme

## 2020-09-29 ENCOUNTER — Other Ambulatory Visit: Payer: Self-pay | Admitting: Neurology

## 2020-09-29 DIAGNOSIS — G47411 Narcolepsy with cataplexy: Secondary | ICD-10-CM

## 2020-09-29 DIAGNOSIS — G4753 Recurrent isolated sleep paralysis: Secondary | ICD-10-CM

## 2020-09-29 MED ORDER — SODIUM OXYBATE 500 MG/ML PO SOLN: ORAL | 5 refills | Status: DC

## 2020-09-30 ENCOUNTER — Telehealth: Payer: Self-pay | Admitting: Neurology

## 2020-09-30 ENCOUNTER — Encounter: Payer: Self-pay | Admitting: Neurology

## 2020-09-30 NOTE — Telephone Encounter (Signed)
I have re faxed the order for the patient with updated cover sheet advising of the dose.  The patient is taking the same medication strength according to our records. Order was sent that patient takes Xyrem 3.5g for the 1st dose and then 2 hrs later takes 4.5 G.   If [facility] pharmacy/Xyrem calls back please confirm this information with them.

## 2020-09-30 NOTE — Telephone Encounter (Signed)
[facility] is asking for a call from RN to discuss pt's directions on taking the Xyrem, please call [place] [phone] option 3 then option 4 for a pharmacist

## 2020-10-04 ENCOUNTER — Other Ambulatory Visit: Payer: Self-pay | Admitting: Neurology

## 2020-10-04 ENCOUNTER — Encounter: Payer: Self-pay | Admitting: Neurology

## 2020-10-04 DIAGNOSIS — G4753 Recurrent isolated sleep paralysis: Secondary | ICD-10-CM

## 2020-10-04 DIAGNOSIS — G47411 Narcolepsy with cataplexy: Secondary | ICD-10-CM

## 2020-10-04 MED ORDER — SODIUM OXYBATE 500 MG/ML PO SOLN: ORAL | 5 refills | Status: DC

## 2020-10-04 NOTE — Telephone Encounter (Signed)
I have faxed updated medication list to reflect that 2.5 hrs after the first dose is when the patient takes the 2nd dose.   ** If [facility] calls please advise: Pt takes Take 3.5gms at bedtime. Take 4.5gms 2.5 hours later. Total of 8 gms per night. This matches what the script states as well.

## 2020-10-04 NOTE — Telephone Encounter (Signed)
[facility]  Larkin Ina) called, Sodium Oxybate 500 MG/ML SOLN prescription has 21/2 hour spacing and medication list has 2 hour spacing. Would like a call to verify. Contact [phone].

## 2020-10-05 ENCOUNTER — Encounter: Payer: BLUE CROSS/BLUE SHIELD | Admitting: Rehabilitative and Restorative Service Providers"

## 2020-10-07 ENCOUNTER — Other Ambulatory Visit: Payer: Self-pay | Admitting: Family Medicine

## 2020-10-07 DIAGNOSIS — K219 Gastro-esophageal reflux disease without esophagitis: Secondary | ICD-10-CM

## 2020-10-08 ENCOUNTER — Telehealth: Payer: Self-pay | Admitting: Neurology

## 2020-10-08 NOTE — Telephone Encounter (Signed)
[facility] Debra Nelson) called, having a conflict with spacing between doses for Sodium Oxybate 500 MG/ML SOLN. Would like a call to confirm the spacing between doses. You can contact the pharmacy at [phone], option 3, option 4.

## 2020-10-11 ENCOUNTER — Encounter: Payer: Self-pay | Admitting: Family Medicine

## 2020-10-11 NOTE — Telephone Encounter (Signed)
Per previous note, I had already corrected the script to reflect why they were calling. Please let [facility] know when they call back the directions in bold below  I have faxed updated medication list to reflect that 2.5 hrs after the first dose is when the patient takes the 2nd dose.   ** If [facility] calls please advise: Pt takes Take 3.5gms at bedtime. Take 4.5gms 2.5 hours later. Total of 8 gms per night. This matches what the script states as well.

## 2020-10-11 NOTE — Telephone Encounter (Signed)
Called the [facility] is aware of this information.

## 2020-10-12 ENCOUNTER — Other Ambulatory Visit: Payer: Self-pay

## 2020-10-12 DIAGNOSIS — N920 Excessive and frequent menstruation with regular cycle: Secondary | ICD-10-CM

## 2020-10-12 NOTE — Addendum Note (Signed)
Addended by: Marin Olp on: 10/12/2020 09:39 AM   Modules accepted: Orders

## 2020-10-13 ENCOUNTER — Other Ambulatory Visit: Payer: Self-pay

## 2020-10-13 ENCOUNTER — Other Ambulatory Visit: Payer: BLUE CROSS/BLUE SHIELD

## 2020-10-13 DIAGNOSIS — N920 Excessive and frequent menstruation with regular cycle: Secondary | ICD-10-CM

## 2020-10-14 ENCOUNTER — Encounter: Payer: Self-pay | Admitting: Family Medicine

## 2020-10-14 LAB — CBC WITH DIFFERENTIAL/PLATELET
Absolute Monocytes: 666 cells/uL (ref 200–950)
Basophils Absolute: 54 cells/uL (ref 0–200)
Basophils Relative: 0.6 %
Eosinophils Absolute: 27 cells/uL (ref 15–500)
Eosinophils Relative: 0.3 %
HCT: 41.9 % (ref 35.0–45.0)
Hemoglobin: 14.2 g/dL (ref 11.7–15.5)
Lymphs Abs: 2547 cells/uL (ref 850–3900)
MCH: 31.7 pg (ref 27.0–33.0)
MCHC: 33.9 g/dL (ref 32.0–36.0)
MCV: 93.5 fL (ref 80.0–100.0)
MPV: 10.3 fL (ref 7.5–12.5)
Monocytes Relative: 7.4 %
Neutro Abs: 5706 cells/uL (ref 1500–7800)
Neutrophils Relative %: 63.4 %
Platelets: 368 10*3/uL (ref 140–400)
RBC: 4.48 10*6/uL (ref 3.80–5.10)
RDW: 12.2 % (ref 11.0–15.0)
Total Lymphocyte: 28.3 %
WBC: 9 10*3/uL (ref 3.8–10.8)

## 2020-10-14 LAB — IRON,TIBC AND FERRITIN PANEL
%SAT: 12 % (calc) — ABNORMAL LOW (ref 16–45)
Ferritin: 40 ng/mL (ref 16–232)
Iron: 39 ug/dL — ABNORMAL LOW (ref 40–190)
TIBC: 332 mcg/dL (calc) (ref 250–450)

## 2020-10-21 ENCOUNTER — Other Ambulatory Visit: Payer: Self-pay | Admitting: Cardiology

## 2020-10-21 ENCOUNTER — Telehealth: Payer: Self-pay | Admitting: Gastroenterology

## 2020-10-21 NOTE — Telephone Encounter (Signed)
Thank you for checking.  I did a chart review, and she can be directly booked for EGD and colonoscopy with me in the [facility].

## 2020-10-21 NOTE — Telephone Encounter (Signed)
Hi Dr. Loletha Carrow, this patient saw you in clinic on 05/18/20 and was scheduled for an endo-colon but had to cancel due to family sickness.  She is looking to reschedule, does she need to come first for an office visit or can we go ahead and schedule appointment.  Thank you.

## 2020-10-22 ENCOUNTER — Encounter: Payer: Self-pay | Admitting: Gastroenterology

## 2020-10-22 NOTE — Telephone Encounter (Signed)
ECL rescheduled to 12/24/20 at 11:00am at [facility].

## 2020-10-24 ENCOUNTER — Other Ambulatory Visit: Payer: Self-pay | Admitting: Family Medicine

## 2020-10-25 ENCOUNTER — Encounter: Payer: BLUE CROSS/BLUE SHIELD | Admitting: Rehabilitative and Restorative Service Providers"

## 2020-10-26 ENCOUNTER — Other Ambulatory Visit: Payer: Self-pay

## 2020-10-26 ENCOUNTER — Ambulatory Visit
Admission: RE | Admit: 2020-10-26 | Discharge: 2020-10-26 | Disposition: A | Discharge status: Home / Self Care | Payer: BLUE CROSS/BLUE SHIELD | Source: Ambulatory Visit | Attending: Sports Medicine | Admitting: Sports Medicine

## 2020-10-26 ENCOUNTER — Other Ambulatory Visit: Payer: Self-pay | Admitting: Sports Medicine

## 2020-10-26 DIAGNOSIS — M542 Cervicalgia: Secondary | ICD-10-CM

## 2020-10-30 ENCOUNTER — Telehealth: Payer: Self-pay | Admitting: Physician Assistant

## 2020-10-30 NOTE — Telephone Encounter (Signed)
Patient paged the after hour answering service, she initially took her morning 25mg  toprol XL, she is on 25mg  BID dosage. After a hour, she accidentally took another 50mg  toprol XL after reaching out to her pillbox in the dark. Therefore, she has taken a total of 75mg  for the morning. I instructed to drink more fluid to manually raise her BP. She will need to check her BP and HR throughout the day. We want to keep her SBP >95 and resting HR between 50-100. If her SBP <90 and she start to feel increasing dizziness or feeling of passing out, she will need to call 911. Otherwise, she has been instructed to hold tonight's dose of metoprolol succinate and resume tomorrow.

## 2020-11-02 ENCOUNTER — Telehealth: Payer: Self-pay | Admitting: Neurology

## 2020-11-02 ENCOUNTER — Other Ambulatory Visit: Payer: Self-pay | Admitting: Family Medicine

## 2020-11-02 DIAGNOSIS — K219 Gastro-esophageal reflux disease without esophagitis: Secondary | ICD-10-CM

## 2020-11-02 NOTE — Telephone Encounter (Signed)
Nurtec PA completed on Cover My Meds. Key: [id number]. Awaiting determination from [employer].

## 2020-11-02 NOTE — Telephone Encounter (Signed)
Mille from [employer] PA is calling for a PA for Rimegepant Sulfate (NURTEC) 75 MG TBD.  She asks to include pt.'s name, dob & status. Best contact: [phone] ext. [place]

## 2020-11-03 NOTE — Telephone Encounter (Signed)
[id number];[id number];Review Type:Prior Auth;Coverage Start Date:10/03/2020;Coverage End Date:11/02/2021;

## 2020-11-18 ENCOUNTER — Other Ambulatory Visit: Payer: Self-pay | Admitting: Cardiology

## 2020-11-25 ENCOUNTER — Other Ambulatory Visit: Payer: Self-pay | Admitting: Family Medicine

## 2020-11-25 DIAGNOSIS — K219 Gastro-esophageal reflux disease without esophagitis: Secondary | ICD-10-CM

## 2020-12-10 ENCOUNTER — Other Ambulatory Visit: Payer: Self-pay

## 2020-12-10 ENCOUNTER — Ambulatory Visit (AMBULATORY_SURGERY_CENTER): Payer: BLUE CROSS/BLUE SHIELD | Admitting: *Deleted

## 2020-12-10 VITALS — Ht 61.5 in | Wt 165.0 lb

## 2020-12-10 DIAGNOSIS — R194 Change in bowel habit: Secondary | ICD-10-CM

## 2020-12-10 DIAGNOSIS — R12 Heartburn: Secondary | ICD-10-CM

## 2020-12-10 DIAGNOSIS — R1013 Epigastric pain: Secondary | ICD-10-CM

## 2020-12-10 DIAGNOSIS — G8929 Other chronic pain: Secondary | ICD-10-CM

## 2020-12-10 MED ORDER — PLENVU 140 G PO SOLR: 1.0000 | ORAL | 0 refills | Status: DC

## 2020-12-10 NOTE — Progress Notes (Signed)
Pt verified name, DOB, address and insurance during PV today. Pt mailed instruction packet to included paper to complete and mail back to [facility] with addressed and stamped envelope, Emmi video, copy of consent form to read and not return, and instructions. Plenvu  coupon mailed in packet. PV completed over the phone. Pt encouraged to call with questions or issues - My Chart instructions as well as mailed   No egg or soy allergy known to patient  No issues with past sedation with any surgeries or procedures No intubation problems in the past  No FH of Malignant Hyperthermia No diet pills per patient No home 02 use per patient  No blood thinners per patient  Pt denies issues with constipation  No A fib or A flutter  EMMI video to pt or via [place] 19 guidelines implemented in PV today with Pt and RN  Pt is fully vaccinated  for Covid   Plenvu  Coupon given to pt in PV today , Code to Pharmacy   Due to the COVID-19 pandemic we are asking patients to follow certain guidelines.  Pt aware of COVID protocols and [facility] guidelines

## 2020-12-21 ENCOUNTER — Encounter: Payer: Self-pay | Admitting: Neurology

## 2020-12-22 ENCOUNTER — Telehealth: Payer: Self-pay | Admitting: Neurology

## 2020-12-22 NOTE — Telephone Encounter (Signed)
I have reached out to the patient to inform her that the [facility] pharmacy has contacted.  The [facility] is concerned about the interaction of the 2 medications listed.  However we do not order either of those medications.  I can certainly check with Dr. Brett Fairy if she is okay with both of those medicines being on board with the Xyrem, but in regards to the interaction between those particular meds the ordering physician would need to okay the continued use.  I have messaged the patient advising that this information.

## 2020-12-22 NOTE — Telephone Encounter (Signed)
Jim from [facility] called & stated that pt.'s medication Xyrem interacts with Olanzapine & ALPRAZolam (XANAX XR) 1 MG 24 hr tablet.  Best contact: [phone] opt. 3/opt. 4

## 2020-12-23 NOTE — Telephone Encounter (Signed)
Dr. Brett Fairy did not agree with the patient taking Olanzipine along with Xyrem.  The drug interactions were significant.  Dr. Brett Fairy has suggested the patient talk with her psychiatrist about them alternative medication.  The psychiatrist has decided to move forward with Lexapro.  Upon research between Valium and Lexapro there appears to be no significant interactions and should be safe for the patient to use in addition to the Xyrem.  If [facility] calls back needing approval for Xyrem to be taken along with Lexapro, from neurology/sleep standpoint it ok.  Marland Kitchen

## 2020-12-24 ENCOUNTER — Encounter: Payer: BLUE CROSS/BLUE SHIELD | Admitting: Gastroenterology

## 2020-12-24 ENCOUNTER — Other Ambulatory Visit: Payer: Self-pay | Admitting: Family Medicine

## 2020-12-24 ENCOUNTER — Telehealth: Payer: Self-pay | Admitting: Gastroenterology

## 2020-12-24 DIAGNOSIS — R194 Change in bowel habit: Secondary | ICD-10-CM

## 2020-12-24 DIAGNOSIS — K219 Gastro-esophageal reflux disease without esophagitis: Secondary | ICD-10-CM

## 2020-12-24 DIAGNOSIS — G8929 Other chronic pain: Secondary | ICD-10-CM

## 2020-12-24 DIAGNOSIS — R12 Heartburn: Secondary | ICD-10-CM

## 2020-12-24 MED ORDER — METOCLOPRAMIDE HCL 5 MG PO TABS: ORAL_TABLET | ORAL | 0 refills | Status: DC

## 2020-12-24 MED ORDER — PLENVU 140 G PO SOLR: 1.0000 | ORAL | 0 refills | Status: DC

## 2020-12-24 NOTE — Telephone Encounter (Signed)
She can be rescheduled for EGD/colonoscopy. Afternoon procedure slot in case more trouble with prep again.  Plenvu  Metoclopramide 5 mg, one tablet 30 minutes before evening and Am prep - Disp 2 tablets  - HD

## 2020-12-24 NOTE — Telephone Encounter (Signed)
Hi Dr. Loletha Carrow, this patient left a message with the answering service to cancel procedure that was scheduled this morning at 11:00am. When I spoke with her, she mentioned that she was not able do the second part of the prep because she was very nauseated, she also mentioned that she spoke with the doctor on call last night and was advised to reschedule if she was not able to complete the prep. Patient will call back rescheduled. Thank you.

## 2020-12-24 NOTE — Telephone Encounter (Signed)
Spoke with patient, she has been rescheduled to Wednesday, 02/09/21 at 2 PM, patient is aware that she will need to arrive at 1 PM. Patient is aware that I will be sending in a new prep and prescription for Metoclopramide to take prior to each prep dose. She is aware that I will send updated instructions via My Chart. Patient verbalized understanding and had no concerns at the end of the call.   Prescriptions sent to pharmacy on file - patient confirmed.

## 2020-12-28 NOTE — Telephone Encounter (Signed)
The difficulty here is that her chart lists a previous neurologic side effect to promethazine (muscle tics), which could also occur from Compazine or metoclopramide, being the only remaining prescription antiemetics. Safer option seems to be to increase the ondansetron 4 mg to 8 mg, she can be taken every 8 hours as needed.  She does not think she will be able to tolerate the bowel preparation due to ongoing nausea, she could also just reschedule for the upper endoscopy.

## 2020-12-29 MED ORDER — ONDANSETRON 4 MG PO TBDP: 8.0000 mg | ORAL_TABLET | Freq: Three times a day (TID) | ORAL | 0 refills | Status: DC | PRN

## 2021-01-04 ENCOUNTER — Ambulatory Visit (INDEPENDENT_AMBULATORY_CARE_PROVIDER_SITE_OTHER): Payer: BLUE CROSS/BLUE SHIELD | Admitting: Physician Assistant

## 2021-01-04 ENCOUNTER — Encounter: Payer: Self-pay | Admitting: Physician Assistant

## 2021-01-04 ENCOUNTER — Other Ambulatory Visit: Payer: Self-pay

## 2021-01-04 VITALS — BP 102/76 | HR 79 | Temp 98.2°F | Ht 61.5 in | Wt 171.4 lb

## 2021-01-04 DIAGNOSIS — R102 Pelvic and perineal pain: Secondary | ICD-10-CM | POA: Diagnosis not present

## 2021-01-04 DIAGNOSIS — R300 Dysuria: Secondary | ICD-10-CM

## 2021-01-04 LAB — COMPREHENSIVE METABOLIC PANEL
ALT: 10 U/L (ref 0–35)
AST: 13 U/L (ref 0–37)
Albumin: 4.3 g/dL (ref 3.5–5.2)
Alkaline Phosphatase: 96 U/L (ref 39–117)
BUN: 7 mg/dL (ref 6–23)
CO2: 30 mEq/L (ref 19–32)
Calcium: 9.3 mg/dL (ref 8.4–10.5)
Chloride: 102 mEq/L (ref 96–112)
Creatinine, Ser: 0.78 mg/dL (ref 0.40–1.20)
GFR: 94.04 mL/min (ref >60.00)
Glucose, Bld: 89 mg/dL (ref 70–99)
Potassium: 3.9 mEq/L (ref 3.5–5.1)
Sodium: 138 mEq/L (ref 135–145)
Total Bilirubin: 0.6 mg/dL (ref 0.2–1.2)
Total Protein: 6.9 g/dL (ref 6.0–8.3)

## 2021-01-04 LAB — POCT URINALYSIS DIPSTICK
Bilirubin, UA: NEGATIVE
Glucose, UA: NEGATIVE
Ketones, UA: NEGATIVE
Leukocytes, UA: NEGATIVE
Nitrite, UA: NEGATIVE
Protein, UA: NEGATIVE
Spec Grav, UA: 1.015 (ref 1.010–1.025)
Urobilinogen, UA: 0.2 E.U./dL
pH, UA: 7 (ref 5.0–8.0)

## 2021-01-04 LAB — CBC WITH DIFFERENTIAL/PLATELET
Basophils Absolute: 0 10*3/uL (ref 0.0–0.1)
Basophils Relative: 0.5 % (ref 0.0–3.0)
Eosinophils Absolute: 0 10*3/uL (ref 0.0–0.7)
Eosinophils Relative: 0.1 % (ref 0.0–5.0)
HCT: 40.5 % (ref 36.0–46.0)
Hemoglobin: 13.8 g/dL (ref 12.0–15.0)
Lymphocytes Relative: 29 % (ref 12.0–46.0)
Lymphs Abs: 2.5 10*3/uL (ref 0.7–4.0)
MCHC: 34.1 g/dL (ref 30.0–36.0)
MCV: 92.3 fl (ref 78.0–100.0)
Monocytes Absolute: 0.5 10*3/uL (ref 0.1–1.0)
Monocytes Relative: 5.6 % (ref 3.0–12.0)
Neutro Abs: 5.5 10*3/uL (ref 1.4–7.7)
Neutrophils Relative %: 64.8 % (ref 43.0–77.0)
Platelets: 390 10*3/uL (ref 150.0–400.0)
RBC: 4.39 Mil/uL (ref 3.87–5.11)
RDW: 12.9 % (ref 11.5–15.5)
WBC: 8.5 10*3/uL (ref 4.0–10.5)

## 2021-01-04 LAB — POCT URINE PREGNANCY: Preg Test, Ur: NEGATIVE

## 2021-01-04 MED ORDER — TAMSULOSIN HCL 0.4 MG PO CAPS: 0.4000 mg | ORAL_CAPSULE | Freq: Every day | ORAL | 0 refills | Status: DC

## 2021-01-04 MED ORDER — NITROFURANTOIN MONOHYD MACRO 100 MG PO CAPS: 100.0000 mg | ORAL_CAPSULE | Freq: Two times a day (BID) | ORAL | 0 refills | Status: DC

## 2021-01-04 NOTE — Progress Notes (Signed)
Debra Nelson is a 40 y.o. female here for a new problem.  I acted as a Education administrator for [employer], PA-C Anselmo Pickler, LPN   History of Present Illness:   Chief Complaint  Patient presents with  . Urinary symptoms    HPI   Urinary symptoms She reports that she has nausea, diarrhea ever since February 3rd when she started her bowel prep for pending colonoscopy. She was unable to complete this and has had ongoing GI symptoms since. This includes poor appetite, nausea. Pt c/o frequency with urination, slight burning and some odor. She is also have lower mid abdominal pelvic cramping. Started 2 days ago. Has mild lower back pain and feels like she is a little unable to sit still but nothing severe. Denies hx of kidney stones or kidney infection. Does have hx of UTI when she has diarrhea.  Patient's last menstrual period was 12/27/2020. Husband had vasectomy.  Wt Readings from Last 5 Encounters:  01/04/21 171 lb 6.1 oz (77.7 kg)  12/10/20 165 lb (74.8 kg)  09/15/20 167 lb 9.6 oz (76 kg)  08/30/20 163 lb (73.9 kg)  08/09/20 163 lb (73.9 kg)     Past Medical History:  Diagnosis Date  . Allergy   . Anemia    low normal- Iron once a week   . Anxiety   . Arthritis   . Asthma   . Bipolar disorder with depression ([place])    and PTSD  . Chicken pox   . Chronic migraine w/o aura, not intractable, w stat migr   . Depression   . DJD (degenerative joint disease)    Severe of the right knee-Dr Creighton-ortho and Dr Gean Birchwood, PA-preferred pain managment  . GERD (gastroesophageal reflux disease)   . Narcolepsy   . Neuromuscular disorder ([facility])    Fibromyalgia  . PTSD (post-traumatic stress disorder)   . PVC (premature ventricular contraction)   . Seasonal allergic rhinitis      Social History   Tobacco Use  . Smoking status: Never Smoker  . Smokeless tobacco: Never Used  Vaping Use  . Vaping Use: Never used  Substance Use Topics  . Alcohol use: No  . Drug use: No     Past Surgical History:  Procedure Laterality Date  . knee surgery x5     08,11, 12, 13 x2- R knee  . NASAL TURBINATE REDUCTION Bilateral 06/2020  . right foot surgery     x 3, arthritis/bone spurus. plate placed.   . WISDOM TOOTH EXTRACTION      Family History  Problem Relation Age of Onset  . Mental illness Mother        does not speak to regularly so does not know full history  . Migraines Mother   . Other Father        medical issues, divorce related  . Other Brother        ptsd- fully discharged medically disabled before age 42  . Heart murmur Brother   . Headache Brother        cluster  . Cancer Paternal Grandfather        type unknown  . Migraines Other   . Colon cancer Neg Hx   . Colon polyps Neg Hx   . Esophageal cancer Neg Hx   . Rectal cancer Neg Hx   . Stomach cancer Neg Hx     Allergies  Allergen Reactions  . Klonopin [Clonazepam] Nausea Only  . Phenergan [Promethazine Hcl]  IV causes muscle tics, may take oral, or give benadryl prior to IV  . Promethazine Other (See Comments)    She cannot take IV phenergan unless this it is given with Benadryl.  . Promethazine Hcl Other (See Comments)    IV causes muscle tics, may take oral, or give benadryl prior to IV  . Topamax [Topiramate] Nausea And Vomiting    confusion    Current Medications:   Current Outpatient Medications:  .  acetaminophen (TYLENOL) 500 MG tablet, Take 500 mg by mouth every 6 (six) hours as needed., Disp: , Rfl:  .  ALPRAZolam (XANAX XR) 1 MG 24 hr tablet, Take 1 mg by mouth daily., Disp: , Rfl:  .  APPLE CIDER VINEGAR PO, Take by mouth in the morning and at bedtime. Pt takes 1 tablet in morning and 1 tablet at night., Disp: , Rfl:  .  Armodafinil 150 MG tablet, TAKE ONE TABLET BY MOUTH DAILY, Disp: 90 tablet, Rfl: 0 .  Ascorbic Acid (VITAMIN C) 1000 MG tablet, Take 1,000 mg by mouth daily., Disp: , Rfl:  .  cyclobenzaprine (FLEXERIL) 10 MG tablet, Take 10 mg by mouth as needed.,  Disp: , Rfl:  .  desloratadine (CLARINEX) 5 MG tablet, Take 5 mg by mouth every evening. , Disp: , Rfl:  .  doxycycline (VIBRA-TABS) 100 MG tablet, Take 100 mg by mouth daily., Disp: , Rfl:  .  EPIPEN 2-PAK 0.3 MG/0.3ML SOAJ injection, as needed., Disp: , Rfl:  .  escitalopram (LEXAPRO) 10 MG tablet, Take 10 mg by mouth every morning., Disp: , Rfl:  .  famotidine (PEPCID) 20 MG tablet, Take 20 mg by mouth 2 (two) times daily., Disp: , Rfl:  .  ferrous sulfate 325 (65 FE) MG EC tablet, Take 325 mg by mouth once a week., Disp: , Rfl:  .  fluticasone (FLONASE SENSIMIST) 27.5 MCG/SPRAY nasal spray, Place 2 sprays into the nose daily., Disp: , Rfl:  .  gabapentin (NEURONTIN) 600 MG tablet, Take 600 mg by mouth 3 (three) times daily., Disp: , Rfl:  .  Galcanezumab-gnlm (EMGALITY) 120 MG/ML SOAJ, Inject 120 mg into the skin every 30 (thirty) days., Disp: 1.12 mL, Rfl: 11 .  ibuprofen (ADVIL) 200 MG tablet, Take 200 mg by mouth every 6 (six) hours as needed., Disp: , Rfl:  .  lamoTRIgine (LAMICTAL) 200 MG tablet, Take 400 mg by mouth at bedtime., Disp: , Rfl:  .  levalbuterol (XOPENEX) 1.25 MG/3ML nebulizer solution, Inhale into the lungs., Disp: , Rfl:  .  loratadine (CLARITIN) 10 MG tablet, Take 10 mg by mouth in the morning. , Disp: , Rfl:  .  metoprolol succinate (TOPROL-XL) 50 MG 24 hr tablet, TAKE 1/2 TABLET BY MOUTH TWICE A DAY, Disp: 30 tablet, Rfl: 11 .  Multiple Vitamins-Minerals (HAIR SKIN AND NAILS FORMULA PO), Take 1 tablet by mouth daily. , Disp: , Rfl:  .  naltrexone (DEPADE) 50 MG tablet, Take 25 mg by mouth daily. BID, Disp: , Rfl:  .  nitrofurantoin, macrocrystal-monohydrate, (MACROBID) 100 MG capsule, Take 1 capsule (100 mg total) by mouth 2 (two) times daily., Disp: 10 capsule, Rfl: 0 .  olopatadine (PATANOL) 0.1 % ophthalmic solution, as needed. , Disp: , Rfl:  .  Olopatadine HCl 0.6 % SOLN, Place into both nostrils., Disp: , Rfl:  .  ondansetron (ZOFRAN-ODT) 4 MG disintegrating  tablet, Take 2 tablets (8 mg total) by mouth every 8 (eight) hours as needed for nausea or vomiting., Disp: 40  tablet, Rfl: 0 .  Ondansetron HCl (ZOFRAN PO), Take 25 mg by mouth as needed., Disp: , Rfl:  .  OVER THE COUNTER MEDICATION, Magnesium- L Theroante 2000 mg - 2 in the am, 1 pm, Disp: , Rfl:  .  pantoprazole (PROTONIX) 40 MG tablet, TAKE 1 TABLET BY MOUTH EVERY DAY, Disp: 30 tablet, Rfl: 0 .  PEG-KCl-NaCl-NaSulf-Na Asc-C (PLENVU) 140 g SOLR, Take 1 kit by mouth as directed. Manufacturer's coupon Universal coupon code:BIN: P2366821; GROUP: [id number]; PCN: [id number]; ID: [id number]; PAY NO MORE $50; NO prior authorization, Disp: 1 each, Rfl: 0 .  promethazine (PHENERGAN) 25 MG tablet, TAKE ONE TABLET BY MOUTH EVERY 6 HOURS AS NEEDED FOR NAUSEA/ VOMITING, Disp: 90 tablet, Rfl: 0 .  Rimegepant Sulfate (NURTEC) 75 MG TBDP, Take 75 mg by mouth daily as needed. For migraines. Take as close to onset of migraine as possible. One daily maximum., Disp: 10 tablet, Rfl: 6 .  Sodium Oxybate 500 MG/ML SOLN, Take 3.5gms at bedtime. Take 4.5gms 2.5 hours later. Total of 8 gms per night. Dispense 335m., Disp: 360 mL, Rfl: 5 .  tamsulosin (FLOMAX) 0.4 MG CAPS capsule, Take 1 capsule (0.4 mg total) by mouth daily., Disp: 30 capsule, Rfl: 0   Review of Systems:   ROS Negative unless otherwise specified per HPI.  Vitals:   Vitals:   01/04/21 1318  BP: 102/76  Pulse: 79  Temp: 98.2 F (36.8 C)  TempSrc: Temporal  SpO2: 98%  Weight: 171 lb 6.1 oz (77.7 kg)  Height: 5' 1.5" (1.562 m)     Body mass index is 31.86 kg/m.  Physical Exam:   Physical Exam Vitals and nursing note reviewed.  Constitutional:      General: She is not in acute distress.    Appearance: She is well-developed. She is not ill-appearing, toxic-appearing or sickly-appearing.  Cardiovascular:     Rate and Rhythm: Normal rate and regular rhythm.     Pulses: Normal pulses.     Heart sounds: Normal heart sounds, S1 normal and S2  normal.     Comments: No LE edema Pulmonary:     Effort: Pulmonary effort is normal.     Breath sounds: Normal breath sounds.  Abdominal:     General: Abdomen is flat. Bowel sounds are normal.     Palpations: Abdomen is soft.     Tenderness: There is abdominal tenderness in the suprapubic area. There is no right CVA tenderness or left CVA tenderness.  Skin:    General: Skin is warm, dry and intact.  Neurological:     Mental Status: She is alert.     GCS: GCS eye subscore is 4. GCS verbal subscore is 5. GCS motor subscore is 6.  Psychiatric:        Mood and Affect: Mood and affect normal.        Speech: Speech normal.        Behavior: Behavior normal. Behavior is cooperative.     Results for orders placed or performed in visit on 01/04/21  POCT urinalysis dipstick  Result Value Ref Range   Color, UA straw    Clarity, UA clear    Glucose, UA Negative Negative   Bilirubin, UA negative    Ketones, UA negative    Spec Grav, UA 1.015 1.010 - 1.025   Blood, UA trace    pH, UA 7.0 5.0 - 8.0   Protein, UA Negative Negative   Urobilinogen, UA 0.2 0.2 or 1.0 E.U./dL   Nitrite,  UA negative    Leukocytes, UA Negative Negative   Appearance     Odor    POCT urine pregnancy  Result Value Ref Range   Preg Test, Ur Negative Negative    Assessment and Plan:   Debra Nelson was seen today for urinary symptoms.  Diagnoses and all orders for this visit:  Dysuria; Pelvic pressure in female Urine pregnancy test negative. UA shows trace blood. Culture will be ordered. Concern for UTI or possible stone based on symptoms. Will empirically treat for both -- macrobid for UTI and flomax to help with urination for a few days. She is requesting lab work given recent GI issues, I have put this in. Low threshold to get abd/pelvis imaging if no improvement or any worsening. -     POCT urinalysis dipstick -     POCT urine pregnancy -     Urine Culture -     CBC with Differential/Platelet -      Comprehensive metabolic panel -     POCT urine pregnancy -     Urine Culture  Other orders -     tamsulosin (FLOMAX) 0.4 MG CAPS capsule; Take 1 capsule (0.4 mg total) by mouth daily. -     nitrofurantoin, macrocrystal-monohydrate, (MACROBID) 100 MG capsule; Take 1 capsule (100 mg total) by mouth 2 (two) times daily.   CMA or LPN served as scribe during this visit. History, Physical, and Plan performed by medical provider. The above documentation has been reviewed and is accurate and complete.   Inda Coke, PA-C

## 2021-01-04 NOTE — Patient Instructions (Signed)
It was great to see you!  Start oral macrobid 100 mg twice daily x 5 days for UTI. May use flomax daily for a few days to see if this can help with pelvic pressure symptoms.  Update blood work today.  General instructions  Make sure you: ? Pee until your bladder is empty. ? Do not hold pee for a long time. ? Empty your bladder after sex. ? Wipe from front to back after pooping if you are a female. Use each tissue one time when you wipe.  Drink enough fluid to keep your pee pale yellow.  Keep all follow-up visits as told by your doctor. This is important. Contact a doctor if:  You do not get better after 1-2 days.  Your symptoms go away and then come back. Get help right away if:  You have very bad back pain.  You have very bad pain in your lower belly.  You have a fever.  You are sick to your stomach (nauseous).  You are throwing up.  Take care,  Inda Coke PA-C

## 2021-01-05 ENCOUNTER — Encounter: Payer: Self-pay | Admitting: Physician Assistant

## 2021-01-05 ENCOUNTER — Ambulatory Visit: Payer: BLUE CROSS/BLUE SHIELD | Admitting: Adult Health

## 2021-01-05 LAB — URINE CULTURE
MICRO NUMBER:: 11536386
SPECIMEN QUALITY:: ADEQUATE

## 2021-01-13 NOTE — Patient Instructions (Addendum)
Please stop by lab before you go If you have mychart- we will send your results within 3 business days of [place] receiving them.  If you do not have mychart- we will call you about results within 5 business days of [place] receiving them.  *please also note that you will see labs on mychart as soon as they post. I will later go in and write notes on them- will say "notes from Dr. Yong Channel"  Health Maintenance Due  Topic Date Due  . Hepatitis C Screening - hoping they can process Never done   Recommended follow up: Return in about 3 months (around 04/13/2021).

## 2021-01-13 NOTE — Progress Notes (Signed)
Phone [phone]   Subjective:  Patient presents today for their annual physical. Chief complaint-noted.   See problem oriented charting- ROS- full  review of systems was completed and negative except for: Multiple symptoms including GI symptoms below, fatigue, migraines though better, sinus issues on multiple fronts despite treatment  The following were reviewed and entered/updated in epic: Past Medical History:  Diagnosis Date  . Allergy   . Anemia    low normal- Iron once a week   . Anxiety   . Arthritis   . Asthma   . Bipolar disorder with depression (HCC)    and PTSD  . Chicken pox   . Chronic migraine w/o aura, not intractable, w stat migr   . Depression   . DJD (degenerative joint disease)    Severe of the right knee-Dr Creighton-ortho and Dr Rockne Coons, PA-preferred pain managment  . GERD (gastroesophageal reflux disease)   . Narcolepsy   . Neuromuscular disorder (HCC)    Fibromyalgia  . PTSD (post-traumatic stress disorder)   . PVC (premature ventricular contraction)   . Seasonal allergic rhinitis    Patient Active Problem List   Diagnosis Date Noted  . Fibromyalgia 08/09/2020    Priority: High  . Chronic post-traumatic stress disorder (PTSD) 02/26/2017    Priority: High  . Narcolepsy cataplexy syndrome 03/06/2014    Priority: High  . Bipolar II disorder (HCC) 12/31/2013    Priority: High  . Symptomatic PVCs 02/26/2020    Priority: Medium  . Asthma 10/27/2016    Priority: Medium  . GERD (gastroesophageal reflux disease) 10/27/2016    Priority: Medium  . Migraine with aura and without status migrainosus, not intractable 10/19/2015    Priority: Medium  . Recurrent isolated sleep paralysis 07/02/2018    Priority: Low  . Menstrual migraine without status migrainosus, not intractable 01/02/2018    Priority: Low  . Chronic thumb pain, bilateral 11/23/2017    Priority: Low  . Elevated TSH 05/07/2017    Priority: Low  . Trigger thumb, left thumb  03/15/2017    Priority: Low  . Sensorineural hearing loss (SNHL) of both ears 12/12/2016    Priority: Low  . Seasonal allergic rhinitis 05/02/2016    Priority: Low  . Right ear pain 05/02/2016    Priority: Low  . Dysfunction of both eustachian tubes 05/02/2016    Priority: Low  . Chondromalacia of knee 03/07/2013    Priority: Low  . Chronic migraine w/o aura w/o status migrainosus, not intractable 08/30/2020  . Primary narcolepsy without cataplexy 08/25/2020  . Other headache syndrome 08/25/2020  . Hypoxemia associated with sleep 08/25/2020  . Erroneous encounter - disregard 04/08/2020  . Atypical chest pain 10/06/2019  . Sinus tachycardia 08/27/2019  . Shortness of breath 08/27/2019   Past Surgical History:  Procedure Laterality Date  . knee surgery x5     08,11, 12, 13 x2- R knee  . NASAL TURBINATE REDUCTION Bilateral 06/2020  . right foot surgery     x 3, arthritis/bone spurus. plate placed.   . WISDOM TOOTH EXTRACTION      Family History  Problem Relation Age of Onset  . Mental illness Mother        does not speak to regularly so does not know full history  . Migraines Mother   . Other Father        medical issues, divorce related  . Other Brother        ptsd- fully discharged medically disabled before age 103  .  Heart murmur Brother   . Headache Brother        cluster  . Cancer Paternal Grandfather        type unknown  . Migraines Other   . Colon cancer Neg Hx   . Colon polyps Neg Hx   . Esophageal cancer Neg Hx   . Rectal cancer Neg Hx   . Stomach cancer Neg Hx     Medications- reviewed and updated Current Outpatient Medications  Medication Sig Dispense Refill  . acetaminophen (TYLENOL) 500 MG tablet Take 500 mg by mouth every 6 (six) hours as needed.    . ALPRAZolam (XANAX XR) 1 MG 24 hr tablet Take 1 mg by mouth daily.    . APPLE CIDER VINEGAR PO Take by mouth in the morning and at bedtime. Pt takes 1 tablet in morning and 1 tablet at night.    .  Armodafinil 150 MG tablet TAKE ONE TABLET BY MOUTH DAILY 90 tablet 0  . Ascorbic Acid (VITAMIN C) 1000 MG tablet Take 1,000 mg by mouth daily.    . cyclobenzaprine (FLEXERIL) 10 MG tablet Take 10 mg by mouth as needed.    . desloratadine (CLARINEX) 5 MG tablet Take 5 mg by mouth every evening.     Marland Kitchen EPIPEN 2-PAK 0.3 MG/0.3ML SOAJ injection as needed.    Marland Kitchen escitalopram (LEXAPRO) 10 MG tablet Take 10 mg by mouth every morning.    . famotidine (PEPCID) 20 MG tablet Take 20 mg by mouth 2 (two) times daily.    . ferrous sulfate 325 (65 FE) MG EC tablet Take 325 mg by mouth once a week.    . fluticasone (FLONASE SENSIMIST) 27.5 MCG/SPRAY nasal spray Place 2 sprays into the nose daily.    Marland Kitchen gabapentin (NEURONTIN) 600 MG tablet Take 600 mg by mouth 3 (three) times daily.    . Galcanezumab-gnlm (EMGALITY) 120 MG/ML SOAJ Inject 120 mg into the skin every 30 (thirty) days. 1.12 mL 11  . ibuprofen (ADVIL) 200 MG tablet Take 200 mg by mouth every 6 (six) hours as needed.    . lamoTRIgine (LAMICTAL) 200 MG tablet Take 400 mg by mouth at bedtime.    . levalbuterol (XOPENEX) 1.25 MG/3ML nebulizer solution Inhale into the lungs.    Marland Kitchen loratadine (CLARITIN) 10 MG tablet Take 10 mg by mouth in the morning.     . metoprolol succinate (TOPROL-XL) 50 MG 24 hr tablet TAKE 1/2 TABLET BY MOUTH TWICE A DAY 30 tablet 11  . Multiple Vitamins-Minerals (HAIR SKIN AND NAILS FORMULA PO) Take 1 tablet by mouth daily.     . naltrexone (DEPADE) 50 MG tablet Take 25 mg by mouth daily. BID    . olopatadine (PATANOL) 0.1 % ophthalmic solution as needed.     . Olopatadine HCl 0.6 % SOLN Place into both nostrils.    . ondansetron (ZOFRAN-ODT) 4 MG disintegrating tablet Take 2 tablets (8 mg total) by mouth every 8 (eight) hours as needed for nausea or vomiting. 40 tablet 0  . pantoprazole (PROTONIX) 40 MG tablet TAKE 1 TABLET BY MOUTH EVERY DAY 30 tablet 0  . promethazine (PHENERGAN) 25 MG tablet TAKE ONE TABLET BY MOUTH EVERY 6 HOURS  AS NEEDED FOR NAUSEA/ VOMITING 90 tablet 0  . Rimegepant Sulfate (NURTEC) 75 MG TBDP Take 75 mg by mouth daily as needed. For migraines. Take as close to onset of migraine as possible. One daily maximum. 10 tablet 6  . Sodium Oxybate 500 MG/ML SOLN Take  3.5gms at bedtime. Take 4.5gms 2.5 hours later. Total of 8 gms per night. Dispense 370ml. 360 mL 5  . doxycycline (VIBRA-TABS) 100 MG tablet Take 100 mg by mouth daily. (Patient not taking: Reported on 01/14/2021)    . OVER THE COUNTER MEDICATION Magnesium- L Theroante 2000 mg - 2 in the am, 1 pm (Patient not taking: Reported on 01/14/2021)    . PEG-KCl-NaCl-NaSulf-Na Asc-C (PLENVU) 140 g SOLR Take 1 kit by mouth as directed. Manufacturer's coupon Universal coupon code:BIN: P2366821; GROUP: [id number]; PCN: [id number]; ID: [id number]; PAY NO MORE $50; NO prior authorization (Patient not taking: Reported on 01/14/2021) 1 each 0   No current facility-administered medications for this visit.    Allergies-reviewed and updated Allergies  Allergen Reactions  . Other Other (See Comments)    Other reaction(s): muscle spasms  . Klonopin [Clonazepam] Nausea Only  . Phenergan [Promethazine Hcl]     IV causes muscle tics, may take oral, or give benadryl prior to IV  . Promethazine Other (See Comments)    She cannot take IV phenergan unless this it is given with Benadryl.  . Promethazine Hcl Other (See Comments)    IV causes muscle tics, may take oral, or give benadryl prior to IV  . Topamax [Topiramate] Nausea And Vomiting    confusion    Social History   Social History Narrative   Patient is married [employer] cabin [employer]) and lives at home with her family   4 adults - 2 kids 50% of the time (1 son, 1 daughter). 2 older children (one age 67 from rape, then 2 year old- does not get to see- related to bipolar) Moved in with in laws to help them      Patient has 4 children (3 of her own and 1 step son).    Patient has a college education. Psychology and biology. Was  paramedic.       Stay at home mom      Caffeine: most days none, maybe an 8 oz coke but rarely    Patient is right-handed.   Objective  Objective:  BP 126/90   Pulse 73   Temp 98.4 F (36.9 C) (Temporal)   Ht $R'5\' 2"'WJ$  (1.575 m)   Wt 169 lb 12.8 oz (77 kg)   LMP 12/27/2020   SpO2 98%   BMI 31.06 kg/m  Gen: NAD, resting comfortably HEENT: Mucous membranes are moist. Oropharynx normal Neck: no thyromegaly CV: RRR no murmurs rubs or gallops Lungs: CTAB no crackles, wheeze, rhonchi Abdomen: soft/nontender/nondistended/normal bowel sounds. No rebound or guarding.  Ext: no edema Skin: warm, dry Neuro: grossly normal, moves all extremities, PERRLA   Assessment and Plan   40 y.o. female presenting for annual physical.  Health Maintenance counseling: 1. Anticipatory guidance: Patient counseled regarding regular dental exams -q6 months- has to have front 4 crowns redone, eye exams - yearly typically- some dry eyes,  avoiding smoking and second hand smoke , limiting alcohol to 1 beverage per day- does not drink except special occasions .   2. Risk factor reduction:  Advised patient of need for regular exercise and diet rich and fruits and vegetables to reduce risk of heart attack and stroke. Exercise- limited by pain- lays on couch a lot with pain- encouraged her to exercise- Dr. Paulla Fore is encouraging. Diet-right now just eating what she is able which is very limited such as a cracker, able to keep fluids down.  Wt Readings from Last 3 Encounters:  01/14/21 169 lb 12.8 oz (  77 kg)  01/04/21 171 lb 6.1 oz (77.7 kg)  12/10/20 165 lb (74.8 kg)  3. Immunizations/screenings/ancillary studies- up to date Immunization History  Administered Date(s) Administered  . Influenza, High Dose Seasonal PF 09/26/2016, 09/27/2020  . Influenza,inj,Quad PF,6+ Mos 09/11/2017, 08/25/2019  . Influenza-Unspecified 10/04/2016, 09/11/2017  . PFIZER(Purple Top)SARS-COV-2 Vaccination 02/11/2020, 03/03/2020,  03/31/2020, 09/27/2020  . Tdap 05/07/2017  . Varicella 09/12/2017  . Zoster Recombinat (Shingrix) 09/12/2017  4. Cervical cancer screening- 03/2018 with 3 year repeat- due later this year 5. Breast cancer screening-  breast exam with gyn and mammogram - with GYN 6. Colon cancer screening - was going to get colonoscopy with GI issues but prep did not go well- this is being delayed for now 7. Skin cancer screening- sees drm for rosacea- dermatology specialists. 8. Birth control/STD check- only active with husband. Husband with vasectomy 9. Osteoporosis screening at 73- consider at later date -Never smoker  Status of chronic or acute concerns   #still with urinary frequency despite antibiotic rxed by our team last week- UTI was not found on culture. Had trace hematuria- will get urine microscopic and repeat culture as she reports worsening symptoms. Having some left flank pain- could also be kidney stone. tamsulosin didn't help from our team- this was stoped by patient  #tried prep for colonoscopy/egs but severe symptoms- including nausea after first dosage of prep for colonoscopy. Gets up dizzy and naouseous every day (Urine pregnancy negative last week). Diarrhea for 2 weeks after- now soft at times mixing with diarrhea. Having some dizziness as well. Low appetite. Just started probiotics and we have jointly hoping this will help. Using 8 mg of zofran. Procedure was going to be February 4th. Having some night sweats- gyn said perimenopausal.   #ENT issues- may switch from Dr. Wilburn Cornelia for 2nd opinion.   #GERD S: Medication: Protonix 40 mg daily-when she stopped this have recurrent issues A/P: Well-controlled when using combination of Protonix and Pepcid-we will continue current medication.  Has upcoming EGD  % Migraines-managed by neurology Dr. Brett Fairy S: Medication: Takes vitamin B6.   Normal MRI brain 01/31/20 ED vsiit for near syncope, tunnel vision- may hae been anxiety or complex  migraine.  As needed treatment: Phenergan for nausea PO only, Nurtec.  Emgality was very helpful A/P: Seems to have improving control-continue close follow-up with Dr. Brett Fairy   % Narcolepsy and cataplexy syndrome-follows with Dr. Brett Fairy S: medication: armodafinil 150 mg daily and Xyrem (sodium oxybate) A/P: Patient reports stable symptoms-continue current medications  % Bipolar II disorder/PTSD/anxiety managed by Dr. Daron Offer previously now with a psychiatrist in [place] S: Medication: lexapro 10 mg, lamictal $RemoveBefo'400mg'HXEXeqWTtWM$ , gabapentin 600 mg 3 times a day, xanax currently on wean -prior buspar 15 mg BID -stressed also with recent urinary and GI issues has worsened depression. No SI.  A/P: Some worsening depressive symptoms due to health issues-she is hoping this will improve as health conditions improve-GI symptoms seem to be a primary stressor for  % Asthma and allergies- managed by allergist S: Maintenance Medication for asthma: arnuity no I did not see this on medication list-we will need to confirm next visit Maintenance medications for allergies: Claritin a.m., Clarinex and p.m., olopatadine and Flonase nasally.  Also uses sinus rinses As needed medication: Xopenex. A/P: Allergy/sinus issues still bothersome despite regimen-she still would like to continue current medication.  No reported worsening asthma control  % MSK concerns- follows with Dr. Paulla Fore. On flexeril 10 mg as needed. She reports doing better with naltrexone  %  PVCs- managed by Dr. Delton Prairie now with Dr. Newman Nickels: Medication: Metoprolol 12.5 mg twice daily is very helpful A/P:  Stable. Continue current medications.     Noted after visit diastolic reading very slightly elevated-we will recheck next visit-has been well controlled in the past-could be due to some of her distress/GI issues BP Readings from Last 3 Encounters:  01/14/21 126/90  01/04/21 102/76  09/15/20 108/80   Recommended follow up: Return in about 3  months (around 04/13/2021).  Patient primarily under specialty care but likes to run her health issues by me as well-we discussed could space visits out further but if she gets value I am okay with every 3 months-she really does have a lot on her plate Future Appointments  Date Time Provider [place]  02/09/2021  2:00 PM Danis, Kirke Corin, MD [facility]  08/02/2021  2:45 PM Ward Givens, NP [facility] None    Lab/Order associations: NOT fasting- 1 chicken nut at 12 pm   ICD-10-CM   1. Preventative health care  Z00.00   2. Severe persistent asthma without complication  E39.53   3. Gastroesophageal reflux disease without esophagitis  K21.9   4. Encounter for hepatitis C screening test for low risk patient  Z11.59 Hepatitis C antibody    Hepatitis C antibody  5. Mild hyperlipidemia  E78.5 CBC With Differential/Platelet    COMPLETE METABOLIC PANEL WITH GFR    TSH    TSH    COMPLETE METABOLIC PANEL WITH GFR    CBC With Differential/Platelet    CANCELED: TSH  6. Hematuria, unspecified type  R31.9 Urine Microscopic    Urine Culture    Urine Culture    Return precautions advised.  Garret Reddish, MD

## 2021-01-14 ENCOUNTER — Ambulatory Visit (INDEPENDENT_AMBULATORY_CARE_PROVIDER_SITE_OTHER): Payer: BLUE CROSS/BLUE SHIELD | Admitting: Family Medicine

## 2021-01-14 ENCOUNTER — Encounter: Payer: Self-pay | Admitting: Family Medicine

## 2021-01-14 ENCOUNTER — Other Ambulatory Visit: Payer: Self-pay

## 2021-01-14 VITALS — BP 126/90 | HR 73 | Temp 98.4°F | Ht 62.0 in | Wt 169.8 lb

## 2021-01-14 DIAGNOSIS — Z1159 Encounter for screening for other viral diseases: Secondary | ICD-10-CM | POA: Diagnosis not present

## 2021-01-14 DIAGNOSIS — Z0000 Encounter for general adult medical examination without abnormal findings: Secondary | ICD-10-CM

## 2021-01-14 DIAGNOSIS — J4550 Severe persistent asthma, uncomplicated: Secondary | ICD-10-CM | POA: Diagnosis not present

## 2021-01-14 DIAGNOSIS — R319 Hematuria, unspecified: Secondary | ICD-10-CM

## 2021-01-14 DIAGNOSIS — K219 Gastro-esophageal reflux disease without esophagitis: Secondary | ICD-10-CM

## 2021-01-14 DIAGNOSIS — E785 Hyperlipidemia, unspecified: Secondary | ICD-10-CM

## 2021-01-15 LAB — COMPLETE METABOLIC PANEL WITH GFR
AG Ratio: 2 (calc) (ref 1.0–2.5)
ALT: 11 U/L (ref 6–29)
AST: 11 U/L (ref 10–30)
Albumin: 4.4 g/dL (ref 3.6–5.1)
Alkaline phosphatase (APISO): 97 U/L (ref 31–125)
BUN: 8 mg/dL (ref 7–25)
CO2: 27 mmol/L (ref 20–32)
Calcium: 9.7 mg/dL (ref 8.6–10.2)
Chloride: 102 mmol/L (ref 98–110)
Creat: 0.77 mg/dL (ref 0.50–1.10)
GFR, Est African American: 111 mL/min/{1.73_m2} (ref >=60)
GFR, Est Non African American: 96 mL/min/{1.73_m2} (ref >=60)
Globulin: 2.2 g/dL (calc) (ref 1.9–3.7)
Glucose, Bld: 86 mg/dL (ref 65–99)
Potassium: 4.4 mmol/L (ref 3.5–5.3)
Sodium: 140 mmol/L (ref 135–146)
Total Bilirubin: 0.5 mg/dL (ref 0.2–1.2)
Total Protein: 6.6 g/dL (ref 6.1–8.1)

## 2021-01-15 LAB — CBC WITH DIFFERENTIAL/PLATELET
Absolute Monocytes: 683 cells/uL (ref 200–950)
Basophils Absolute: 73 cells/uL (ref 0–200)
Basophils Relative: 0.8 %
Eosinophils Absolute: 73 cells/uL (ref 15–500)
Eosinophils Relative: 0.8 %
HCT: 41.8 % (ref 35.0–45.0)
Hemoglobin: 14.2 g/dL (ref 11.7–15.5)
Lymphs Abs: 2867 cells/uL (ref 850–3900)
MCH: 31.3 pg (ref 27.0–33.0)
MCHC: 34 g/dL (ref 32.0–36.0)
MCV: 92.3 fL (ref 80.0–100.0)
MPV: 10.5 fL (ref 7.5–12.5)
Monocytes Relative: 7.5 %
Neutro Abs: 5405 cells/uL (ref 1500–7800)
Neutrophils Relative %: 59.4 %
Platelets: 382 10*3/uL (ref 140–400)
RBC: 4.53 10*6/uL (ref 3.80–5.10)
RDW: 12.2 % (ref 11.0–15.0)
Total Lymphocyte: 31.5 %
WBC: 9.1 10*3/uL (ref 3.8–10.8)

## 2021-01-15 LAB — URINALYSIS, MICROSCOPIC ONLY
Bacteria, UA: NONE SEEN /HPF
Hyaline Cast: NONE SEEN /LPF
RBC / HPF: NONE SEEN /HPF (ref 0–2)
Squamous Epithelial / HPF: NONE SEEN /HPF (ref <=5)
WBC, UA: NONE SEEN /HPF (ref 0–5)

## 2021-01-15 LAB — TSH: TSH: 0.82 mIU/L

## 2021-01-16 LAB — URINE CULTURE
MICRO NUMBER:: 11580707
SPECIMEN QUALITY:: ADEQUATE

## 2021-01-17 LAB — HEPATITIS C ANTIBODY
Hepatitis C Ab: NONREACTIVE
SIGNAL TO CUT-OFF: 0 (ref <1.00)

## 2021-01-21 ENCOUNTER — Other Ambulatory Visit: Payer: Self-pay | Admitting: Family Medicine

## 2021-01-21 DIAGNOSIS — K219 Gastro-esophageal reflux disease without esophagitis: Secondary | ICD-10-CM

## 2021-01-24 ENCOUNTER — Telehealth: Payer: Self-pay | Admitting: Adult Health

## 2021-01-24 ENCOUNTER — Other Ambulatory Visit: Payer: Self-pay | Admitting: Obstetrics and Gynecology

## 2021-01-24 NOTE — Telephone Encounter (Signed)
FYI: Pt called, snoring is worsening have scheduled an appt on 01/25/21 with Megan.

## 2021-01-25 ENCOUNTER — Ambulatory Visit: Payer: BLUE CROSS/BLUE SHIELD | Admitting: Adult Health

## 2021-01-25 ENCOUNTER — Encounter: Payer: Self-pay | Admitting: Adult Health

## 2021-01-25 ENCOUNTER — Other Ambulatory Visit: Payer: Self-pay

## 2021-01-25 VITALS — BP 134/91 | HR 76 | Ht 61.0 in | Wt 171.0 lb

## 2021-01-25 DIAGNOSIS — R0683 Snoring: Secondary | ICD-10-CM | POA: Diagnosis not present

## 2021-01-25 DIAGNOSIS — R0681 Apnea, not elsewhere classified: Secondary | ICD-10-CM | POA: Diagnosis not present

## 2021-01-25 DIAGNOSIS — G47411 Narcolepsy with cataplexy: Secondary | ICD-10-CM

## 2021-01-25 NOTE — Telephone Encounter (Signed)
See pt for migraine, and narcolepsy (sleep) issues.  [place] for appt.

## 2021-01-25 NOTE — Progress Notes (Signed)
PATIENT: Debra Nelson DOB: 06-13-1981  REASON FOR VISIT: follow up HISTORY FROM: patient  HISTORY OF PRESENT ILLNESS: Today 01/25/21:  Debra Nelson is a 40 year old female with a history of narcolepsy with cataplexy.  She returns today for follow-up.  The patient reports that her sleep has gotten worse.  Her husband is with her today.  He reports witnessed apneic events.  She states that despite taking Xyrem she is more tired during the day.  Takes more naps.  States that she wakes up with headaches in the morning and even after taking naps.  She states in the morning she wakes up and feels nauseous and dizzy.  She states that she has more an O2 monitor at night and has found that her oxygen drops to 80%.  She also reports that sometimes she wakes up and feels like she is choking.  She had a home sleep test in 2019 that was unremarkable.  She had a PSG with MSLT in 2015.  HISTORY  01/25/21:  Debra Nelson is a 40 year old female with a history of narcolepsy with cataplexy.  She returns today for follow-up.  She reports that Xyrem and armodafinil is working well for her.  She denies any cataplectic events.  She still has a history of depression but does not feel that it is any worse.  She does have a psychiatrist.  Denies any suicidal thoughts.  She does report that her PCP thought that she may have fibromyalgia? (I advised a referral to rheumatology by her PCP may be needed.)   Patient has a consultation with Dr. Jaynee Eagles later this month for her headaches.  HISTORY 01/12/20:  Debra Nelson is a 40 year old female with a history of narcolepsy with cataplexy.  She returns today for follow-up.  She continues on Xyrem and a new vigil.  She states that her depression has been worse with Covid but denies any suicidal thoughts.  She does see a psychiatrist.  She is currently not working.  Denies any trouble driving.  Denies any cataplectic events.  Reports that she has had an increase in headaches in  the last month.  The location varies.  She states that it can sometimes be sharp or dull.  She does have photophobia and phonophobia on occasion.  Denies nausea and vomiting.  She does take Tylenol and reports that it works intermittently.  She also has sumatriptan.  In the past she has been on Zonegran.    REVIEW OF SYSTEMS: Out of a complete 14 system review of symptoms, the patient complains only of the following symptoms, and all other reviewed systems are negative.  Epworth sleepiness score 14 Fatigue severity score 46  ALLERGIES: Allergies  Allergen Reactions  . Other Other (See Comments)    Other reaction(s): muscle spasms  . Klonopin [Clonazepam] Nausea Only  . Phenergan [Promethazine Hcl]     IV causes muscle tics, may take oral, or give benadryl prior to IV  . Promethazine Other (See Comments)    She cannot take IV phenergan unless this it is given with Benadryl.  . Promethazine Hcl Other (See Comments)    IV causes muscle tics, may take oral, or give benadryl prior to IV  . Topamax [Topiramate] Nausea And Vomiting    confusion    HOME MEDICATIONS: Outpatient Medications Prior to Visit  Medication Sig Dispense Refill  . acetaminophen (TYLENOL) 500 MG tablet Take 500 mg by mouth every 6 (six) hours as needed.    . ALPRAZolam (  XANAX XR) 1 MG 24 hr tablet Take 1 mg by mouth daily.    . APPLE CIDER VINEGAR PO Take by mouth in the morning and at bedtime. Pt takes 1 tablet in morning and 1 tablet at night.    . Armodafinil 150 MG tablet TAKE ONE TABLET BY MOUTH DAILY 90 tablet 0  . Ascorbic Acid (VITAMIN C) 1000 MG tablet Take 1,000 mg by mouth daily.    . cyclobenzaprine (FLEXERIL) 10 MG tablet Take 10 mg by mouth as needed.    . desloratadine (CLARINEX) 5 MG tablet Take 5 mg by mouth every evening.     Marland Kitchen doxycycline (VIBRA-TABS) 100 MG tablet Take 100 mg by mouth daily.    Marland Kitchen EPIPEN 2-PAK 0.3 MG/0.3ML SOAJ injection as needed.    Marland Kitchen escitalopram (LEXAPRO) 10 MG tablet Take 10  mg by mouth every morning.    . famotidine (PEPCID) 20 MG tablet Take 20 mg by mouth 2 (two) times daily.    . ferrous sulfate 325 (65 FE) MG EC tablet Take 325 mg by mouth once a week.    . fluticasone (FLONASE SENSIMIST) 27.5 MCG/SPRAY nasal spray Place 2 sprays into the nose daily.    Marland Kitchen gabapentin (NEURONTIN) 600 MG tablet Take 600 mg by mouth 3 (three) times daily.    . Galcanezumab-gnlm (EMGALITY) 120 MG/ML SOAJ Inject 120 mg into the skin every 30 (thirty) days. 1.12 mL 11  . ibuprofen (ADVIL) 200 MG tablet Take 200 mg by mouth every 6 (six) hours as needed.    . lamoTRIgine (LAMICTAL) 200 MG tablet Take 400 mg by mouth at bedtime.    . levalbuterol (XOPENEX) 1.25 MG/3ML nebulizer solution Inhale into the lungs.    Marland Kitchen loratadine (CLARITIN) 10 MG tablet Take 10 mg by mouth in the morning.     . metoprolol succinate (TOPROL-XL) 50 MG 24 hr tablet TAKE 1/2 TABLET BY MOUTH TWICE A DAY 30 tablet 11  . Multiple Vitamins-Minerals (HAIR SKIN AND NAILS FORMULA PO) Take 1 tablet by mouth daily.     . naltrexone (DEPADE) 50 MG tablet Take 25 mg by mouth daily. BID    . olopatadine (PATANOL) 0.1 % ophthalmic solution as needed.     . Olopatadine HCl 0.6 % SOLN Place into both nostrils.    . ondansetron (ZOFRAN-ODT) 4 MG disintegrating tablet Take 2 tablets (8 mg total) by mouth every 8 (eight) hours as needed for nausea or vomiting. 40 tablet 0  . OVER THE COUNTER MEDICATION Magnesium- L Theroante 2000 mg - 2 in the am, 1 pm    . pantoprazole (PROTONIX) 40 MG tablet TAKE 1 TABLET BY MOUTH EVERY DAY 30 tablet 0  . PEG-KCl-NaCl-NaSulf-Na Asc-C (PLENVU) 140 g SOLR Take 1 kit by mouth as directed. Manufacturer's coupon Universal coupon code:BIN: P2366821; GROUP: [id number]; PCN: [id number]; ID: [id number]; PAY NO MORE $50; NO prior authorization 1 each 0  . promethazine (PHENERGAN) 25 MG tablet TAKE ONE TABLET BY MOUTH EVERY 6 HOURS AS NEEDED FOR NAUSEA/ VOMITING 90 tablet 0  . Rimegepant Sulfate (NURTEC) 75 MG  TBDP Take 75 mg by mouth daily as needed. For migraines. Take as close to onset of migraine as possible. One daily maximum. 10 tablet 6  . Sodium Oxybate 500 MG/ML SOLN Take 3.5gms at bedtime. Take 4.5gms 2.5 hours later. Total of 8 gms per night. Dispense 327ml. 360 mL 5   No facility-administered medications prior to visit.    PAST MEDICAL HISTORY: Past Medical History:  Diagnosis Date  . Allergy   . Anemia    low normal- Iron once a week   . Anxiety   . Arthritis   . Asthma   . Bipolar disorder with depression ([place])    and PTSD  . Chicken pox   . Chronic migraine w/o aura, not intractable, w stat migr   . Depression   . DJD (degenerative joint disease)    Severe of the right knee-Dr Creighton-ortho and Dr Gean Birchwood, PA-preferred pain managment  . GERD (gastroesophageal reflux disease)   . Narcolepsy   . Neuromuscular disorder ([facility])    Fibromyalgia  . PTSD (post-traumatic stress disorder)   . PVC (premature ventricular contraction)   . Seasonal allergic rhinitis     PAST SURGICAL HISTORY: Past Surgical History:  Procedure Laterality Date  . knee surgery x5     08,11, 12, 13 x2- R knee  . NASAL TURBINATE REDUCTION Bilateral 06/2020  . right foot surgery     x 3, arthritis/bone spurus. plate placed.   . WISDOM TOOTH EXTRACTION      FAMILY HISTORY: Family History  Problem Relation Age of Onset  . Mental illness Mother        does not speak to regularly so does not know full history  . Migraines Mother   . Other Father        medical issues, divorce related  . Other Brother        ptsd- fully discharged medically disabled before age 73  . Heart murmur Brother   . Headache Brother        cluster  . Cancer Paternal Grandfather        type unknown  . Migraines Other   . Colon cancer Neg Hx   . Colon polyps Neg Hx   . Esophageal cancer Neg Hx   . Rectal cancer Neg Hx   . Stomach cancer Neg Hx     SOCIAL HISTORY: Social History   Socioeconomic History   . Marital status: Married    Spouse name: [name]  . Number of children: 3  . Years of education: College  . Highest education level: Not on file  Occupational History    Employer: OTHER  Tobacco Use  . Smoking status: Never Smoker  . Smokeless tobacco: Never Used  Vaping Use  . Vaping Use: Never used  Substance and Sexual Activity  . Alcohol use: No  . Drug use: No  . Sexual activity: Yes    Partners: Male    Birth control/protection: None  Other Topics Concern  . Not on file  Social History Narrative   Patient is married ([name]) and lives at home with her family   4 adults - 2 kids 50% of the time (1 son, 1 daughter). 2 older children (one age 81 from rape, then 78 year old- does not get to see- related to bipolar) Moved in with in laws to help them      Patient has 4 children (3 of her own and 1 step son).    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   Social Determinants of Health   Financial Resource Strain: Not on file  Food Insecurity: Not on file  Transportation Needs: Not on file  Physical Activity: Not on file  Stress: Not on file  Social Connections: Not on file  Intimate Partner Violence: Not on file      PHYSICAL EXAM  Vitals:   01/25/21 1116  BP: (!) 134/91  Pulse: 76  Weight: 171 lb (77.6 kg)  Height: $Remove'5\' 1"'aFkRgTg$  (1.549 m)   Body mass index is 32.31 kg/m.  Generalized: Well developed, in no acute distress   Neurological examination  Mentation: Alert oriented to time, place, history taking. Follows all commands speech and language fluent Cranial nerve II-XII: Pupils were equal round reactive to light. Extraocular movements were full, visual field were full on confrontational test. Facial sensation and strength were normal. Uvula tongue midline. Head turning and shoulder shrug  were normal and symmetric.   Mallampati 2+, neck circumference 15 inches Motor: The motor testing reveals 5 over 5 strength of all 4 extremities. Good symmetric motor tone is noted throughout.  Sensory: Sensory testing is intact to soft touch on all 4 extremities. No evidence of extinction is noted.  Coordination: Cerebellar testing reveals good finger-nose-finger and heel-to-shin bilaterally.  Gait and station: Gait is normal.  Reflexes: Deep tendon reflexes are symmetric and normal bilaterally.   DIAGNOSTIC DATA (LABS, IMAGING, TESTING) - I reviewed patient records, labs, notes, testing and imaging myself where available.  Lab Results  Component Value Date   WBC 9.1 01/14/2021   HGB 14.2 01/14/2021   HCT 41.8 01/14/2021   MCV 92.3 01/14/2021   PLT 382 01/14/2021      Component Value Date/Time   NA 140 01/14/2021 1647   NA 138 04/16/2020 1145   K 4.4 01/14/2021 1647   CL 102 01/14/2021 1647   CO2 27 01/14/2021 1647   GLUCOSE 86 01/14/2021 1647   BUN 8 01/14/2021 1647   BUN 11 04/16/2020 1145   CREATININE 0.77 01/14/2021 1647   CALCIUM 9.7 01/14/2021 1647   PROT 6.6 01/14/2021 1647   PROT 7.2 07/10/2019 1008   ALBUMIN 4.3 01/04/2021 1348   ALBUMIN 5.2 (H) 07/10/2019 1008   AST 11 01/14/2021 1647   ALT 11 01/14/2021 1647   ALKPHOS 96 01/04/2021 1348   BILITOT 0.5 01/14/2021 1647   BILITOT 0.7 07/10/2019 1008   GFRNONAA 96 01/14/2021 1647   GFRAA 111 01/14/2021 1647   Lab Results  Component Value Date   CHOL 213 (H) 09/16/2020   HDL 59 09/16/2020   LDLCALC 136 (H) 09/16/2020   TRIG 79 09/16/2020   CHOLHDL 3.6 09/16/2020   Lab Results  Component Value Date   HGBA1C 4.9 01/30/2018   Lab Results  Component Value Date   VITAMINB12 >2000 (H) 01/30/2018   Lab Results  Component Value Date   TSH 0.82 01/14/2021      ASSESSMENT AND PLAN 40 y.o. year old female  has a past medical history of Allergy, Anemia, Anxiety, Arthritis, Asthma, Bipolar disorder with depression ([place]), Chicken pox,  Chronic migraine w/o aura, not intractable, w stat migr, Depression, DJD (degenerative joint disease), GERD (gastroesophageal reflux disease), Narcolepsy, Neuromuscular disorder ([place]), PTSD (post-traumatic stress disorder), PVC (premature ventricular contraction), and Seasonal allergic rhinitis. here with:  1.  Narcolepsy with cataplexy 2.  Witnessed apneic events 3.  Snoring   --Patient will continue on Xyrem for narcolepsy --Patient is having worsening symptoms.  Discussed with Shirlean Mylar in the sleep lab-advised that insurance should approve a PSG and MSLT. However, there are a few medications she will have to stop in order to get MSLT. I will have the nurse reach out to the patient  to discuss.  --Patient requested to have modafinil in addition to Xyrem.  Advised that we would wait until after results from testing before adding on any additional medication.   I spent 30 minutes of face-to-face and non-face-to-face time with patient.  This included previsit chart review, lab review, study review, order entry, electronic health record documentation, patient education.  Ward Givens, MSN, NP-C 01/25/2021, 11:22 AM [facility] [address] [place], [place] [address] [phone]

## 2021-01-27 ENCOUNTER — Other Ambulatory Visit: Payer: Self-pay | Admitting: Physician Assistant

## 2021-01-31 ENCOUNTER — Other Ambulatory Visit: Payer: Self-pay | Admitting: Neurology

## 2021-01-31 DIAGNOSIS — G4736 Sleep related hypoventilation in conditions classified elsewhere: Secondary | ICD-10-CM

## 2021-01-31 DIAGNOSIS — R0683 Snoring: Secondary | ICD-10-CM

## 2021-01-31 DIAGNOSIS — G47411 Narcolepsy with cataplexy: Secondary | ICD-10-CM

## 2021-01-31 DIAGNOSIS — R0681 Apnea, not elsewhere classified: Secondary | ICD-10-CM

## 2021-02-09 ENCOUNTER — Telehealth: Payer: Self-pay | Admitting: Gastroenterology

## 2021-02-09 ENCOUNTER — Encounter: Payer: BLUE CROSS/BLUE SHIELD | Admitting: Gastroenterology

## 2021-02-09 NOTE — Telephone Encounter (Signed)
Pt is scheduled for EGD with Dr. Loletha Carrow at 2pm. Pt stated that she began coughing last night, and when she woke up this morning she notice throat swelling/soreness, and that her cough has gotten worse. Pt stated that she will go to get a covid test, and would like to reschedule procedure for a later time when she is feeling better to air on the side of caution.

## 2021-02-09 NOTE — Telephone Encounter (Signed)
Patient called and states she has developed a bad cough along with her husband and is not sure if she should still come in please advise.

## 2021-02-10 ENCOUNTER — Encounter: Payer: Self-pay | Admitting: Neurology

## 2021-02-10 MED ORDER — ARMODAFINIL 250 MG PO TABS
250.0000 mg | ORAL_TABLET | Freq: Every day | ORAL | 3 refills | Status: DC
Start: 2021-02-10 — End: 2021-03-03

## 2021-02-14 ENCOUNTER — Encounter: Payer: Self-pay | Admitting: Neurology

## 2021-02-14 ENCOUNTER — Other Ambulatory Visit: Payer: Self-pay | Admitting: Family Medicine

## 2021-02-14 ENCOUNTER — Ambulatory Visit (INDEPENDENT_AMBULATORY_CARE_PROVIDER_SITE_OTHER): Payer: BLUE CROSS/BLUE SHIELD | Admitting: Neurology

## 2021-02-14 ENCOUNTER — Other Ambulatory Visit: Payer: Self-pay

## 2021-02-14 DIAGNOSIS — R0681 Apnea, not elsewhere classified: Secondary | ICD-10-CM

## 2021-02-14 DIAGNOSIS — G47411 Narcolepsy with cataplexy: Secondary | ICD-10-CM | POA: Diagnosis not present

## 2021-02-14 DIAGNOSIS — G4736 Sleep related hypoventilation in conditions classified elsewhere: Secondary | ICD-10-CM

## 2021-02-14 DIAGNOSIS — R0683 Snoring: Secondary | ICD-10-CM

## 2021-02-14 DIAGNOSIS — K219 Gastro-esophageal reflux disease without esophagitis: Secondary | ICD-10-CM

## 2021-02-14 DIAGNOSIS — G4753 Recurrent isolated sleep paralysis: Secondary | ICD-10-CM

## 2021-02-15 ENCOUNTER — Encounter: Payer: Self-pay | Admitting: Neurology

## 2021-02-15 ENCOUNTER — Telehealth: Payer: Self-pay | Admitting: Neurology

## 2021-02-15 NOTE — Telephone Encounter (Signed)
PA submitted through [facility] [id number] Can take up to 72 hrs before hearing a response

## 2021-02-21 NOTE — Telephone Encounter (Signed)
Medication approved for the patient.  [id number];LHTDSK:[id number];Review Type:Prior Auth;Coverage Start Date:01/16/2021;Coverage End Date:02/15/2022

## 2021-02-22 ENCOUNTER — Encounter: Payer: Self-pay | Admitting: Neurology

## 2021-02-22 DIAGNOSIS — R0681 Apnea, not elsewhere classified: Secondary | ICD-10-CM | POA: Insufficient documentation

## 2021-02-22 DIAGNOSIS — R0683 Snoring: Secondary | ICD-10-CM | POA: Insufficient documentation

## 2021-02-22 MED ORDER — ONDANSETRON 4 MG PO TBDP: 8.0000 mg | ORAL_TABLET | Freq: Three times a day (TID) | ORAL | 0 refills | Status: DC | PRN

## 2021-02-22 MED ORDER — SODIUM OXYBATE 500 MG/ML PO SOLN: ORAL | 5 refills | Status: DC

## 2021-02-22 NOTE — Procedures (Signed)
PATIENT'S NAME:  Debra Nelson DOB:      12-Nov-1981      MR#:    [id number]     DATE OF RECORDING: 02/14/2021 Richard Miu REFERRING M.D.:  Ward Givens, NP Study Performed:   Baseline Polysomnogram HISTORY: 06-22-21 Debra Nelson is a 40 year old female with a history of narcolepsy with cataplexy and reports that her sleep has gotten worse.  Her husband witnessed apneic events.  She states that despite taking Xyrem she is more tired during the day.  Takes more naps.  States that she wakes up with headaches in the morning and even after taking naps, wakes up and feels nauseous and dizzy.  She states that she has more an O2 monitor at night and has found that her oxygen drops to 80%.   She also reports that sometimes she wakes up and feels like she is choking.  She had a home sleep test in 2019 that was unremarkable. She had a PSG with MSLT in 2015 which confirmed narcolepsy diagnosis.  The patient endorsed the Epworth Sleepiness Scale at 14 points.   The patient's weight 171 pounds with a height of 61 (inches), resulting in a BMI of 32.5 kg/m2. The patient's neck circumference measured 15 inches.  CURRENT MEDICATIONS: Tylenol, Xanax XR, Apple Cider Vinegar PO, Armodafinil, Vitamin C, Flexeril, Clarinex, Vibra-Tabs, EpiPen, Lexapro, Pepcid, Ferrous Sulfate, Flonase, Neurontin, Emgality, Advil, Lamictal, Xopenex, Claritin, Toprol-XL, Multiple Vitamins-Minerals   PROCEDURE:  This is a multichannel digital polysomnogram utilizing the Somnostar 11.2 system.  Electrodes and sensors were applied and monitored per AASM Specifications.   EEG, EOG, Chin and Limb EMG, were sampled at 200 Hz.  ECG, Snore and Nasal Pressure, Thermal Airflow, Respiratory Effort, CPAP Flow and Pressure, Oximetry was sampled at 50 Hz. Digital video and audio were recorded.      BASELINE STUDY: Lights Out was at 22:26 and Lights On at 05:13.  Total recording time (TRT) was 408 minutes, with a total sleep time (TST) of 361  minutes.   The patient's sleep latency was 31.5 minutes.  REM latency was 176 minutes.  The sleep efficiency was 88.5 %.     SLEEP ARCHITECTURE: WASO (Wake after sleep onset) was 15.5 minutes.  There were 76 minutes in Stage N1, 246 minutes Stage N2, 11.5 minutes Stage N3 and 27.5 minutes in Stage REM.  The percentage of Stage N1 was 21.1%, Stage N2 was 68.1%, Stage N3 was 3.2% and Stage R (REM sleep) was 7.6%.    RESPIRATORY ANALYSIS:  There were a total of 13 respiratory events:  2 obstructive apneas, 0 central apneas and 0 mixed apneas with a total of 2 apneas and an apnea index (AI) of .3 /hour. There were 11 hypopneas with a hypopnea index of 1.8 /hour. The patient also had 0 respiratory event related arousals (RERAs).      The total APNEA/HYPOPNEA INDEX (AHI) was 2.2/hour.  2 events occurred in REM sleep and 20 events in NREM. The REM AHI was 4.4 /hour, versus a non-REM AHI of 2.. The patient spent 43 minutes of total sleep time in the supine position and 318 minutes in non-supine. The supine AHI was 1.4/h. versus a non-supine AHI of 2.3.  OXYGEN SATURATION & C02:  The Wake baseline 02 saturation was 94%, with the lowest being 83%. Time spent below 89% saturation equaled 9 minutes.  The arousals were noted as: 55 were spontaneous, 10 were associated with PLMs, 3 were associated with respiratory events.  The  patient had a total of 152 Periodic Limb Movements.  The Periodic Limb Movement (PLM) Arousal index was 1.7/hour. These were jerking movements and occurred clustered in REM sleep- highly unusual.  Audio and video analysis did not show any abnormal behaviors, phonations or vocalizations.   EKG was in keeping with normal sinus rhythm (NSR).  IMPRESSION:  1. No clinically significant degree of Obstructive Sleep Apnea (OSA) but soft snoring was noted. 2. Remarkable REM sleep clustered Periodic Limb Movement Disorder (PLMD). 3. Delayed REM sleep onset on medication. Fewer arousals outside  of REM sleep than in REM sleep.    RECOMMENDATIONS: The patient reported restless and non- restorative sleep.  1. Advise treatment for PLMs- leg movements, PLMs in REM sleep.  I will reduce the Xyrem dose by 20% and if this is not helpful would go to change to wakix/ sunosi.    I certify that I have reviewed the entire raw data recording prior to the issuance of this report in accordance with the Standards of Accreditation of the American Academy of Sleep Medicine (AASM)      Larey Seat, MD Diplomat, American Board of Psychiatry and Neurology  Diplomat, American Board of Sleep Medicine Market researcher, [facility] [employer]

## 2021-02-22 NOTE — Addendum Note (Signed)
Addended by: Larey Seat on: 02/22/2021 04:58 PM   Modules accepted: Orders

## 2021-02-22 NOTE — Progress Notes (Signed)
APNEA/HYPOPNEA INDEX (AHI) was 2.2/hour.  2 events occurred in REM sleep and 20 events in NREM. The REM AHI was 4.4 /hour, versus a non-REM AHI of 2/h.Marland KitchenThe arousals were noted as: 55 were spontaneous, 10 were associated with PLMs, and only 3 were associated with respiratory events.  The Periodic Limb Movement (PLM) Arousal index was 1.7/hour. These were jerking movements and occurred clustered in REM sleep- highly unusual.   IMPRESSION:  1. No clinically significant degree of Obstructive Sleep Apnea (OSA) but soft snoring was noted. 2. Remarkable REM sleep clustered Periodic Limb Movement Disorder (PLMD). 3. Delayed REM sleep onset on medication. Fewer arousals outside of REM sleep than in REM sleep.    RECOMMENDATIONS: The patient reported restless and non- restorative sleep.  1. Advise treatment for PLMs- leg movements, PLMs in REM sleep.  I will reduce the Xyrem dose by 20% and if this is not helpful would go to change to wakix/ sunosi.  ( I already  reduced the first dose at nighttime to 3 grams from 3.5 grams).

## 2021-02-23 ENCOUNTER — Other Ambulatory Visit: Payer: Self-pay | Admitting: Neurology

## 2021-02-23 ENCOUNTER — Other Ambulatory Visit: Payer: Self-pay | Admitting: Family Medicine

## 2021-02-24 ENCOUNTER — Encounter: Payer: Self-pay | Admitting: Family Medicine

## 2021-02-25 ENCOUNTER — Encounter: Payer: Self-pay | Admitting: Family Medicine

## 2021-02-25 ENCOUNTER — Encounter: Payer: Self-pay | Admitting: Neurology

## 2021-02-25 ENCOUNTER — Telehealth (INDEPENDENT_AMBULATORY_CARE_PROVIDER_SITE_OTHER): Payer: BLUE CROSS/BLUE SHIELD | Admitting: Family Medicine

## 2021-02-25 ENCOUNTER — Telehealth: Payer: Self-pay

## 2021-02-25 VITALS — BP 114/86 | HR 73 | Temp 98.4°F | Ht 61.5 in | Wt 170.0 lb

## 2021-02-25 DIAGNOSIS — J4541 Moderate persistent asthma with (acute) exacerbation: Secondary | ICD-10-CM | POA: Diagnosis not present

## 2021-02-25 MED ORDER — PREDNISONE 20 MG PO TABS: ORAL_TABLET | ORAL | 0 refills | Status: DC

## 2021-02-25 MED ORDER — GUAIFENESIN-CODEINE 100-10 MG/5ML PO SOLN: 5.0000 mL | Freq: Four times a day (QID) | ORAL | 0 refills | Status: DC | PRN

## 2021-02-25 NOTE — Progress Notes (Signed)
Phone [phone] Virtual visit via Video note   Subjective:  Chief complaint: Chief Complaint  Patient presents with  . Cough    Patient thinks this is her typical bronchitis that she gets. Covid test negative.     This visit type was conducted due to national recommendations for restrictions regarding the COVID-19 Pandemic (e.g. social distancing).  This format is felt to be most appropriate for this patient at this time balancing risks to patient and risks to population by having him in for in person visit.  No physical exam was performed (except for noted visual exam or audio findings with Telehealth visits).    Our team/I connected with [name] at 11:40 AM EDT by a video enabled telemedicine application ([email or web address] or caregility through epic) and verified that I am speaking with the correct person using two identifiers.  Location patient: [place] Location provider: [facility], office Persons participating in the virtual visit:  patient  Our team/I discussed the limitations of evaluation and management by telemedicine and the availability of in person appointments. In light of current covid-19 pandemic, patient also understands that we are trying to protect them by minimizing in office contact if at all possible.  The patient expressed consent for telemedicine visit and agreed to proceed. Patient understands insurance will be billed.   Past Medical History-  Patient Active Problem List   Diagnosis Date Noted  . Fibromyalgia 08/09/2020    Priority: High  . Chronic post-traumatic stress disorder (PTSD) 02/26/2017    Priority: High  . Narcolepsy and cataplexy 03/06/2014    Priority: High  . Bipolar II disorder ([place]) 12/31/2013    Priority: High  . Symptomatic PVCs 02/26/2020    Priority: Medium  . Asthma 10/27/2016    Priority: Medium  . GERD (gastroesophageal reflux disease) 10/27/2016    Priority: Medium  . Migraine with aura and without status migrainosus, not  intractable 10/19/2015    Priority: Medium  . Recurrent isolated sleep paralysis 07/02/2018    Priority: Low  . Menstrual migraine without status migrainosus, not intractable 01/02/2018    Priority: Low  . Chronic thumb pain, bilateral 11/23/2017    Priority: Low  . Elevated TSH 05/07/2017    Priority: Low  . Trigger thumb, left thumb 03/15/2017    Priority: Low  . Sensorineural hearing loss (SNHL) of both ears 12/12/2016    Priority: Low  . Seasonal allergic rhinitis 05/02/2016    Priority: Low  . Right ear pain 05/02/2016    Priority: Low  . Dysfunction of both eustachian tubes 05/02/2016    Priority: Low  . Chondromalacia of knee 03/07/2013    Priority: Low  . Snoring 02/22/2021  . Witnessed apneic spells 02/22/2021  . Chronic migraine w/o aura w/o status migrainosus, not intractable 08/30/2020  . Primary narcolepsy without cataplexy 08/25/2020  . Other headache syndrome 08/25/2020  . Hypoxemia associated with sleep 08/25/2020  . Erroneous encounter - disregard 04/08/2020  . Atypical chest pain 10/06/2019  . Sinus tachycardia 08/27/2019  . Shortness of breath 08/27/2019    Medications- reviewed and updated Current Outpatient Medications  Medication Sig Dispense Refill  . acetaminophen (TYLENOL) 500 MG tablet Take 500 mg by mouth every 6 (six) hours as needed.    . ALPRAZolam (XANAX XR) 1 MG 24 hr tablet Take 1 mg by mouth daily.    . APPLE CIDER VINEGAR PO Take by mouth in the morning and at bedtime. Pt takes 1 tablet in morning and 1 tablet at  night.    . Armodafinil 250 MG tablet Take 1 tablet (250 mg total) by mouth daily. 90 tablet 3  . Ascorbic Acid (VITAMIN C) 1000 MG tablet Take 1,000 mg by mouth daily.    . cyclobenzaprine (FLEXERIL) 10 MG tablet Take 10 mg by mouth as needed.    . desloratadine (CLARINEX) 5 MG tablet Take 5 mg by mouth every evening.     Marland Kitchen EPIPEN 2-PAK 0.3 MG/0.3ML SOAJ injection as needed.    Marland Kitchen escitalopram (LEXAPRO) 10 MG tablet Take 10 mg  by mouth every morning.    . famotidine (PEPCID) 20 MG tablet Take 20 mg by mouth 2 (two) times daily.    . ferrous sulfate 325 (65 FE) MG EC tablet Take 325 mg by mouth once a week.    . fluticasone (FLONASE SENSIMIST) 27.5 MCG/SPRAY nasal spray Place 2 sprays into the nose daily.    Marland Kitchen gabapentin (NEURONTIN) 600 MG tablet Take 600 mg by mouth 3 (three) times daily.    . Galcanezumab-gnlm (EMGALITY) 120 MG/ML SOAJ Inject 120 mg into the skin every 30 (thirty) days. 1.12 mL 11  . guaiFENesin-codeine 100-10 MG/5ML syrup Take 5 mLs by mouth every 6 (six) hours as needed for cough. 120 mL 0  . ibuprofen (ADVIL) 200 MG tablet Take 200 mg by mouth every 6 (six) hours as needed.    . lamoTRIgine (LAMICTAL) 200 MG tablet Take 400 mg by mouth at bedtime.    . levalbuterol (XOPENEX) 1.25 MG/3ML nebulizer solution Inhale into the lungs.    Marland Kitchen loratadine (CLARITIN) 10 MG tablet Take 10 mg by mouth in the morning.     . metoprolol succinate (TOPROL-XL) 50 MG 24 hr tablet TAKE 1/2 TABLET BY MOUTH TWICE A DAY 30 tablet 11  . Multiple Vitamins-Minerals (HAIR SKIN AND NAILS FORMULA PO) Take 1 tablet by mouth daily.     Marland Kitchen olopatadine (PATANOL) 0.1 % ophthalmic solution as needed.     . Olopatadine HCl 0.6 % SOLN Place into both nostrils.    . ondansetron (ZOFRAN-ODT) 4 MG disintegrating tablet Take 2 tablets (8 mg total) by mouth every 8 (eight) hours as needed for nausea or vomiting. 40 tablet 0  . OVER THE COUNTER MEDICATION Magnesium- L Theroante 2000 mg - 2 in the am, 1 pm    . pantoprazole (PROTONIX) 40 MG tablet TAKE 1 TABLET BY MOUTH EVERY DAY 30 tablet 0  . Rimegepant Sulfate (NURTEC) 75 MG TBDP Take 75 mg by mouth daily as needed. For migraines. Take as close to onset of migraine as possible. One daily maximum. 10 tablet 6  . tamsulosin (FLOMAX) 0.4 MG CAPS capsule TAKE 1 CAPSULE BY MOUTH EVERY DAY 30 capsule 0  . traMADol (ULTRAM) 50 MG tablet Take 50 mg by mouth 4 (four) times daily as needed.    Marland Kitchen  PEG-KCl-NaCl-NaSulf-Na Asc-C (PLENVU) 140 g SOLR Take 1 kit by mouth as directed. Manufacturer's coupon Universal coupon code:BIN: P2366821; GROUP: [id number]; PCN: [id number]; ID: [id number]; PAY NO MORE $50; NO prior authorization 1 each 0  . predniSONE (DELTASONE) 20 MG tablet Take 2 pills for 3 days, 1 pill for 4 days 10 tablet 0   No current facility-administered medications for this visit.     Objective:  BP 114/86   Pulse 73   Temp 98.4 F (36.9 C) (Temporal)   Ht 5' 1.5" (1.562 m)   Wt 170 lb (77.1 kg)   LMP 02/05/2021   SpO2 96%   BMI  31.60 kg/m  self reported vitals Patient with frequent cough during visit, appears fatigued Lungs:slightly elevated respiratory rate Skin: appears dry, no obvious rash     Assessment and Plan   % Asthma and allergies- managed by allergist #concern for bronchitis per patient S: acute symptom history:   Patient states has had multiple sick family members. Covid at home tests have been negative. She started out with sinus pressure about a week ago then felt like went down into her chest. Has wheezy barking cough like she has had with bronchitis in the past. She has tried delsym and not controlling cough. Some shortness of breath along with the wheeze- gets better with levalbuterol  Allergies are a trigger.  Worse if goes outside  Maintenance Medication for asthma: arnuity  In past- recently taken off- apparently had some side effects Maintenance medications for allergies: Claritin a.m., Clarinex and p.m., olopatadine and Flonase nasally. As needed medication: Xopenex - helpful when she uses it- she states she held off until last night A/P: This appears to be an asthma exacerbation to me with increased wheezing and shortness of breath-upper respiratory infection/viral infection likely triggered this-she will use prednisone to try to calm this down.  I want her to schedule her levalbuterol at least every 6 hours for the next 24 hours and can use every 4  hours if needed. -requests codeine cough syrup if having coughing fits- she knows to use this as last resort if levalbuterol not effective -encouraged her to call allergist since having flare once coming off of arnuity- had side effects so may have to look at other options - also reports negative at home PCR test as well for covid -No longer taking naltrexone for several weeks.  She was prescribed tramadol which has only taken 1 tablet-she knows not to take this within 8 hours of taking codeine.  Also should not drive after "none  Recommended follow up: as needed for acute concerns Future Appointments  Date Time Provider [place]  03/24/2021  3:40 PM Donato Heinz, MD [facility] [place]  04/28/2021  1:30 PM [facility] PRE[facility] RM50 [facility]  05/12/2021  1:30 PM Danis, Kirke Corin, MD [facility]  08/02/2021  2:45 PM Ward Givens, NP [facility] None    Lab/Order associations:   ICD-10-CM   1. Moderate persistent asthma with exacerbation  J45.41     Meds ordered this encounter  Medications  . guaiFENesin-codeine 100-10 MG/5ML syrup    Sig: Take 5 mLs by mouth every 6 (six) hours as needed for cough.    Dispense:  120 mL    Refill:  0  . predniSONE (DELTASONE) 20 MG tablet    Sig: Take 2 pills for 3 days, 1 pill for 4 days    Dispense:  10 tablet    Refill:  0    Return precautions advised.  Garret Reddish, MD

## 2021-02-25 NOTE — Telephone Encounter (Signed)
[facility] called to let us know there is a drug interaction for the pt between 2 medications below. The Naltrexone I did not see on pt med list, but pharmacy states she is taking it   guaiFENesin-codeine 100-10 MG/5ML syrup  Naltrexone 50 mg daily

## 2021-02-25 NOTE — Patient Instructions (Addendum)
Health Maintenance Due  Topic Date Due  . PAP SMEAR-Modifier  04/04/2021    Depression screen [facility] 2/9 01/14/2021 04/09/2020 03/23/2020  Decreased Interest 3 0 0  Down, Depressed, Hopeless 3 0 2  PHQ - 2 Score 6 0 2  Altered sleeping 3 0 0  Tired, decreased energy 3 3 1   Change in appetite 3 0 0  Feeling bad or failure about yourself  3 0 0  Trouble concentrating 3 0 0  Moving slowly or fidgety/restless 3 0 0  Suicidal thoughts 0 0 0  PHQ-9 Score 24 3 3   Difficult doing work/chores Extremely dIfficult Not difficult at all Not difficult at all  Some recent data might be hidden    Recommended follow up: No follow-ups on file.

## 2021-02-25 NOTE — Telephone Encounter (Signed)
Patient is no longer thanking this medication pharmacy is aware.

## 2021-02-25 NOTE — Progress Notes (Signed)
RECOMMENDATIONS: The patient reported restless and non-  restorative sleep.   1. Advise treatment for PLMs- leg movements, PLMs in REM sleep.   I will reduce the Xyrem dose by 20% and if this is not helpful  would go to change to wakix/ sunosi.

## 2021-02-27 ENCOUNTER — Other Ambulatory Visit: Payer: Self-pay | Admitting: Family Medicine

## 2021-02-28 ENCOUNTER — Telehealth: Payer: Self-pay

## 2021-02-28 ENCOUNTER — Other Ambulatory Visit: Payer: Self-pay | Admitting: Sports Medicine

## 2021-02-28 DIAGNOSIS — M542 Cervicalgia: Secondary | ICD-10-CM

## 2021-02-28 NOTE — Telephone Encounter (Signed)
Spoke with patient and scheduled her for an appointment this Thursday 4/14 at 10:30 AM with Dr Dohmeier to discuss leg movements on sleep study. Pt verbalized appreciation for the call.

## 2021-02-28 NOTE — Telephone Encounter (Signed)
Patient reports that she received a MyChart message from Dr. Brett Fairy.  She would like a call from the nurse to discuss it.

## 2021-03-02 ENCOUNTER — Encounter: Payer: Self-pay | Admitting: Family Medicine

## 2021-03-03 ENCOUNTER — Encounter: Payer: Self-pay | Admitting: Neurology

## 2021-03-03 ENCOUNTER — Ambulatory Visit: Payer: BLUE CROSS/BLUE SHIELD | Admitting: Neurology

## 2021-03-03 ENCOUNTER — Other Ambulatory Visit: Payer: Self-pay

## 2021-03-03 VITALS — BP 125/91 | HR 72 | Ht 61.5 in | Wt 174.0 lb

## 2021-03-03 DIAGNOSIS — F3181 Bipolar II disorder: Secondary | ICD-10-CM | POA: Diagnosis not present

## 2021-03-03 DIAGNOSIS — G4761 Periodic limb movement disorder: Secondary | ICD-10-CM | POA: Diagnosis not present

## 2021-03-03 MED ORDER — GABAPENTIN 600 MG PO TABS: ORAL_TABLET | ORAL | 5 refills | Status: DC

## 2021-03-03 MED ORDER — PREDNISONE 20 MG PO TABS: ORAL_TABLET | ORAL | 0 refills | Status: DC

## 2021-03-03 MED ORDER — ARMODAFINIL 250 MG PO TABS: ORAL_TABLET | ORAL | 3 refills | Status: DC

## 2021-03-03 MED ORDER — PROMETHAZINE HCL 12.5 MG PO TABS: 12.5000 mg | ORAL_TABLET | Freq: Four times a day (QID) | ORAL | 0 refills | Status: DC | PRN

## 2021-03-03 MED ORDER — ONDANSETRON 4 MG PO TBDP: 8.0000 mg | ORAL_TABLET | Freq: Three times a day (TID) | ORAL | 0 refills | Status: DC | PRN

## 2021-03-03 NOTE — Progress Notes (Signed)
SLEEP MEDICINE CLINIC   Provider:  Larey Seat, MD   Referring Provider/ Primary Care Physician:  Marin Olp, MD   Cc Chucky May, MD  Chief Complaint  Patient presents with  . Follow-up    Pt alone rm 11. Presents following up. She has remained off xyrem and has tolerated that well. States that with armodafinil it is working well and around 1/2 p may start feeling sluggish. Here to discuss recent SSR and PLM's noted   03-03-2021: RV after report of feeling "overdrugged" on Xyrem, has weaned off  Xyrem and feels great !  Last seen by Raynelle Dick, NP in March 28th, 2022.  She had been doing well on XYREM for years before- since 2014.  Now has developed a lower tolerance for the side effects or has been no longer helped.  No more parasomnia, but more RLS? Marland Kitchen  She is getting better on sleep on Zyprexa,   Now seeing; Marissa Nestle, PA.  Bipolar depression-  Dr. Paulla Fore, [facility]- [facility] and [facility].  Dr. Yong Channel remains PCP.  Had a home sleep test,this was negative for OSA: quoted below-  She had quite a bit of PLM. Has RLS- we will start treating this now.  Dr Yong Channel has already looked into electrolyte , TSH, Hep C, CBC and differential, all normal. I initially wanted to use a very low dose of a dopamine agonist, but she is also already on neurontin 600 mg tid, prescribed by her new psychiatry provider. Lets up the night time dose.     HST report: Marland Kitchen  APNEA/HYPOPNEA INDEX (AHI) was 2.2/hour.  2 events occurred in REM sleep and 20 events in NREM. The REM AHI was 4.4 /hour, versus a non-REM AHI of 2/h.Marland KitchenThe arousals were noted as: 55 were spontaneous, 10 were associated with PLMs, and only 3 were associated with respiratory events. The Periodic Limb Movement (PLM) Arousal index was 1.7/hour. These were jerking movements and occurred clustered in REM sleep- highly unusual.   IMPRESSION:  1. No clinically significant degree of Obstructive Sleep  Apnea (OSA) but soft snoring was noted. 2. Remarkable REM sleep clustered Periodic Limb Movement Disorder (PLMD). 3. Delayed REM sleep onset on medication. Fewer arousals outside of REM sleep than in REM sleep.      RV : 07-10-2019,I have the pleasure of seeing Mrs. Birkland in the presence of her husband here today , she is usually getting a good nights rest. Epworth is 8/ 24 and FSS endorsed at 34/63 point.  Migraine have been infrequent.  Blood work and refills today.  Depression : symptoms denied.   RV 01-02-2019, meeting for refills.  Xyrem user at  3.5 gram for first dose and 4.5 second dose. She is today suffering form a GI infection and last night was affected, sleep reportedly poor. She lost weight. She quit Sodas- and the sugar intake reduction helped with fatigue. She hydrates more with water, and she goes to the bathroom more often.    XYREM and NUVIGIL controlled her hypersomnia and lithium, Buspar and Lamictal controlled her anxiety/ Bipolar disorder.    XYREM has alleviated her cataplexy - No need for tofranil.  Increased XYREM 2 month ago to 3.5 gram first dose and 4.5 g second dose nightly.  Parasomnia/ sleep walking activity on XYREM has ceased after the dose was reduced from 4 to 3 gram twice at night but Xyrem was less effective.  She reports now not having parasomnia activity in spite of  higher dosing.  I have the pleasure of seeing Mrs. Livingood in the presence of her husband here today on 02 July 2018. She is a longtime established narcolepsy patient in our practice who is taking Xyrem.  She has overall achieved very good control of hypersomnia but we had to find the right dose for her liquid nightly trigger sleepwalking episodes nor allow her to resume her nightmarish dreams.  For many months she did well at 3 g twice at night, however the dreams have now returned in the early morning hours following the second dose by probably 2 hours also.  We could increase the second  Xyrem dose to 3.5 g, and may be divided the 2 doses by a longer interval.  Of 2-1/2 hours.  This may cover the second half of the night better, but it puts her at risk of daytime sleepiness if the Xyrem is still in her system.  Usually she works at 7.30 AM . This allows the intake time to be 1.30 AM, the first dose around 22.00 hours.     Mamye Bolds Umland is a 40 y.o. female , seen here as a revisit from Dr. Yong Channel for treatment of hypersomnia, HLA positive , diagnosed with Narcolepsy. Mrs. Horrell has been titrated to Xyrem which has given her final several years of good control of hypersomnia.  Originally I had titrated her to the maximum dose of 4.5 g twice at night but she began to sleep walk and had other parasomnia activity, the dose was reduced to 3 g twice at night under which she did well for the last 18 months.  She now reports that she is not sleeping nearly as deep, she has breakthrough dream activity and her husband confirms that it is easier to wake her than it used to be.  Since she has room to further increase the dose we can go from here to at least 3.5 g twice at night and see if further adjustments are necessary.  I would like this process to go slow as not to reintroduce sleepwalking. The fatigue severity score was endorsed at 42 points and the Epworth sleepiness score at 8 points.   This is still a great effect in comparison to the untreated Epworth sleepiness score.   Today is 07/02/2017, And as the pleasure of seeing Ms. Kameran Lallier in the presence of her young daughter. The patient endorses only 3 points on her Epworth Sleepiness Scale today, 22% on her fatigue score and is doing very well in terms of control of sleepiness, cataplexy and narcolepsy. She has lost weight. She finally got off Soda. She is not employed right now.  Her husband was retired from the police due to DDD.    08-31-2016  Kashay Cavenaugh Calip is a 40 y.o. female , seen here as a revisit from Dr. Yong Channel for  treatment of hypersomnia, HLA positive , diagnosed with Narcolepsy and cataplexy . The patient developed sleepwalking episodes on Xyrem, but she could not find control of her narcolepsy and cataplexy without the medication. For this reason she has restarted apnea monitoring her closely. Her fatigue severity scale today is 47, Epworth sleepiness score is 6 points on Xyrem with armodafinil . The [employer] has now been again covered by her insurance not longer on Tofranil-.Mood overall is good, less variability, not euphoric.Not suicidal - no intent , no plan.   Mrs. Barkalow reports morning headaches frequently now, my concern is that Xyrem may induce hypoventilation that she could be  a CO2 retainer. 3 gram of Xyrem has controlled sleepiness and not allowed sleepwaling, but still has vivid dreams.   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  She has started to sleep walk almost nightly and severely. The discussion as to discontinue the Xyrem and rely modafinil only- but that would not affect her cataplexy. Currently she has not taken Xyrem for 12 days but is not sleep walking, but her cataplexy is poorly controlled. I would like very much for her to go back on Xyrem but it has to be in a safe environment for she can be watched not vice versa. I will give her Tofranil today to control the cataplexy as long as she is not able to go back on Xyrem and I would refill modafinil.-Nuvigil. This helped a lot in day time. ( Due to parasomnia activity Xyrem is currently on hold. Her husband needs to be back on his feet before we can stop the medication again I also will give her Tofranil to help control cataplexy be continued Nuvigil in daytime. They can change the dosing of Xyrem to the first dose being given at 4.5 g at night and the second dose to be 2 or even 3 g less. Sometimes  this will help to prevent parasomnias in the morning hours. I would also like for her to have a motion detector at the door of the bedroom so that she may wake up or others are alerted to her activity. I would like to recommend her to lock all doors and especially keeps a car keys in a pocket or a drawer but they are not visible)  Chief complaint according to patient :" Recurrent hypersomnia "after the patient's medications were no longer approved by insurance.  She was changed from Nuvigil to Provigil and has not responded well.The patient is an HLA positive narcoleptic with co-morbidities of  Bipolar disorder. This limits the use of stimulants in her treatment.  She was changed from [place] to [place] by Psychiatry, and her anxiety level was lower than it had been in a long time.The patient felt that her psychiatric condition has been very well controlled with the new LATUDA  Medication- her anxiety level decreased she was much less irritable. Shortly after that  the medication Nuvigil was not longer covered and she is now sleepier and less attentive.  Sleep studies : see media. The patient has been on Xyrem, and lost weight in the process.   Interval 10-19-15, Mrs. Missey reports that she had a good, but very busy Thanksgiving and that she has been the main caretaker of her husband , who is recovering from her surgery. Overall she has lost more weight since being on Xyrem. Her insurance stopped covering the Nuvigil as I had reported at her last visit she was changed from Nuvigil to Provigil but had not responded as well and especially not to the generic form. She has noted that it takes her longer to fall asleep on Xyrem.  She needs to get back on Nuvigil for daytime sleepiness. Propranolol seems to work less for headache prophylaxis. The recurrence of headaches can also be related to the discontinuation of Nuvigil.    Review of Systems: Out of a complete 14 system review, the patient complains of only  the following symptoms, and all other reviewed systems are negative. Fatigue severity  score  Again 28 from 41 (Xyrem)  from 53, before xyrem  Epworth Sleepiness score endorsed at 07-02-2018  2/ 24 , dreams are coming back before she wakes up. Nightmarish-  She is not sleep walking - she was at 4.5 gram at the time. She dropped the dose to first 3.0 gram and the second to 3.0 - will now go to 3.5 at sec dose.    depression score PHQ 9 :  How likely are you to doze in the following situations: 0 = not likely, 1 = slight chance, 2 = moderate chance, 3 = high chance  Sitting and Reading? 1 Watching Television?1 Sitting inactive in a public place (theater or meeting)?1 As a passenger in a car for an hour without a break? 1 Lying down in the afternoon when circumstances permit? 2 Sitting and talking to someone?0 Sitting quietly after lunch without alcohol? 1 In a car, while stopped for a few minutes in traffic?0   Total =7 on Xyrem , 01-02-2019   Recurrent dreams, no cataplexy.  Snoring  Memory loss. multitasking difficulties. [place] Office Visit from 01/14/2021 in [place]  PHQ-9 Total Score 24        Social History   Socioeconomic History  . Marital status: Married    Spouse name: [name]  . Number of children: 3  . Years of education: College  . Highest education level: Not on file  Occupational History    Employer: OTHER  Tobacco Use  . Smoking status: Never Smoker  . Smokeless tobacco: Never Used  Vaping Use  . Vaping Use: Never used  Substance and Sexual Activity  . Alcohol use: No  . Drug use: No  . Sexual activity: Yes    Partners: Male    Birth control/protection: None  Other Topics Concern  . Not on file  Social History Narrative   Patient is married ([name]) and lives at home with her family   4 adults - 2 kids 50% of the time (1 son, 1 daughter). 2 older children (one age 70 from rape, then 30 year old- does not get to see-  related to bipolar) Moved in with in laws to help them      Patient has 4 children (3 of her own and 1 step son).    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   Social Determinants of Health   Financial Resource Strain: Not on file  Food Insecurity: Not on file  Transportation Needs: Not on file  Physical Activity: Not on file  Stress: Not on file  Social Connections: Not on file  Intimate Partner Violence: Not on file    Family History  Problem Relation Age of Onset  . Mental illness Mother        does not speak to regularly so does not know full history  . Migraines Mother   . Other Father        medical issues, divorce related  . Other Brother        ptsd- fully discharged medically disabled before age 31  . Heart murmur Brother   . Headache Brother        cluster  . Cancer Paternal Grandfather        type unknown  . Migraines Other   .  Colon cancer Neg Hx   . Colon polyps Neg Hx   . Esophageal cancer Neg Hx   . Rectal cancer Neg Hx   . Stomach cancer Neg Hx     Past Medical History:  Diagnosis Date  . Allergy   . Anemia    low normal- Iron once a week   . Anxiety   . Arthritis   . Asthma   . Bipolar disorder with depression ([place])    and PTSD  . Chicken pox   . Chronic migraine w/o aura, not intractable, w stat migr   . Depression   . DJD (degenerative joint disease)    Severe of the right knee-Dr Creighton-ortho and Dr Gean Birchwood, PA-preferred pain managment  . GERD (gastroesophageal reflux disease)   . Narcolepsy   . Neuromuscular disorder ([facility])    Fibromyalgia  . PTSD (post-traumatic stress disorder)   . PVC (premature ventricular contraction)   . Seasonal allergic rhinitis     Past Surgical History:  Procedure Laterality Date  . knee surgery x5     08,11, 12, 13 x2- R knee  . NASAL TURBINATE  REDUCTION Bilateral 06/2020  . right foot surgery     x 3, arthritis/bone spurus. plate placed.   . WISDOM TOOTH EXTRACTION      Current Outpatient Medications  Medication Sig Dispense Refill  . acetaminophen (TYLENOL) 500 MG tablet Take 500 mg by mouth every 6 (six) hours as needed.    . ALPRAZolam (XANAX XR) 1 MG 24 hr tablet Take 1 mg by mouth daily.    . APPLE CIDER VINEGAR PO Take by mouth in the morning and at bedtime. Pt takes 1 tablet in morning and 1 tablet at night.    . Armodafinil 250 MG tablet Take 1 tablet (250 mg total) by mouth daily. 90 tablet 3  . Ascorbic Acid (VITAMIN C) 1000 MG tablet Take 1,000 mg by mouth daily.    . cyclobenzaprine (FLEXERIL) 10 MG tablet Take 10 mg by mouth as needed.    . desloratadine (CLARINEX) 5 MG tablet Take 5 mg by mouth every evening.     Marland Kitchen EPIPEN 2-PAK 0.3 MG/0.3ML SOAJ injection as needed.    Marland Kitchen escitalopram (LEXAPRO) 10 MG tablet Take 10 mg by mouth every morning.    . famotidine (PEPCID) 20 MG tablet Take 20 mg by mouth 2 (two) times daily.    . ferrous sulfate 325 (65 FE) MG EC tablet Take 325 mg by mouth once a week.    . fluticasone (FLONASE SENSIMIST) 27.5 MCG/SPRAY nasal spray Place 2 sprays into the nose daily.    Marland Kitchen gabapentin (NEURONTIN) 600 MG tablet Take 600 mg by mouth 3 (three) times daily.    . Galcanezumab-gnlm (EMGALITY) 120 MG/ML SOAJ Inject 120 mg into the skin every 30 (thirty) days. 1.12 mL 11  . guaiFENesin-codeine 100-10 MG/5ML syrup Take 5 mLs by mouth every 6 (six) hours as needed for cough. 120 mL 0  . ibuprofen (ADVIL) 200 MG tablet Take 200 mg by mouth every 6 (six) hours as needed.    . lamoTRIgine (LAMICTAL) 200 MG tablet Take 400 mg by mouth at bedtime.    . levalbuterol (XOPENEX) 1.25 MG/3ML nebulizer solution Inhale into the lungs.    Marland Kitchen loratadine (CLARITIN) 10 MG tablet Take 10 mg by mouth in the morning.     . metoprolol succinate (TOPROL-XL) 50 MG 24 hr tablet TAKE 1/2 TABLET BY MOUTH TWICE A DAY 30  tablet 11  .  Multiple Vitamins-Minerals (HAIR SKIN AND NAILS FORMULA PO) Take 1 tablet by mouth daily.     Marland Kitchen olopatadine (PATANOL) 0.1 % ophthalmic solution as needed.     . Olopatadine HCl 0.6 % SOLN Place into both nostrils.    . ondansetron (ZOFRAN-ODT) 4 MG disintegrating tablet Take 2 tablets (8 mg total) by mouth every 8 (eight) hours as needed for nausea or vomiting. 40 tablet 0  . OVER THE COUNTER MEDICATION Magnesium- L Theroante 2000 mg - 2 in the am, 1 pm    . pantoprazole (PROTONIX) 40 MG tablet TAKE 1 TABLET BY MOUTH EVERY DAY 30 tablet 0  . PEG-KCl-NaCl-NaSulf-Na Asc-C (PLENVU) 140 g SOLR Take 1 kit by mouth as directed. Manufacturer's coupon Universal coupon code:BIN: P2366821; GROUP: [id number]; PCN: [id number]; ID: [id number]; PAY NO MORE $50; NO prior authorization 1 each 0  . predniSONE (DELTASONE) 20 MG tablet Take 2 pills for 3 days, 1 pill for 4 days 10 tablet 0  . Rimegepant Sulfate (NURTEC) 75 MG TBDP Take 75 mg by mouth daily as needed. For migraines. Take as close to onset of migraine as possible. One daily maximum. 10 tablet 6  . tamsulosin (FLOMAX) 0.4 MG CAPS capsule TAKE 1 CAPSULE BY MOUTH EVERY DAY 90 capsule 1  . traMADol (ULTRAM) 50 MG tablet Take 50 mg by mouth 4 (four) times daily as needed.     No current facility-administered medications for this visit.    Allergies as of 03/03/2021 - Review Complete 03/03/2021  Allergen Reaction Noted  . Other Other (See Comments) 03/07/2013  . Klonopin [clonazepam] Nausea Only 12/19/2013  . Phenergan [promethazine hcl]  12/26/2011  . Promethazine Other (See Comments) 03/07/2013  . Promethazine hcl Other (See Comments) 12/26/2011  . Topamax [topiramate] Nausea And Vomiting 12/19/2013   Vitals: BP (!) 125/91   Pulse 72   Ht 5' 1.5" (1.562 m)   Wt 174 lb (78.9 kg)   LMP 02/05/2021   BMI 32.34 kg/m  Last Weight:  Wt Readings from Last 1 Encounters:  03/03/21 174 lb (78.9 kg)   PJS:RPRX mass index is 32.34 kg/m.      Last Height:   Ht Readings from Last 1 Encounters:  03/03/21 5' 1.5" (1.562 m)    Physical exam:  General: The patient is awake, alert and appears relaxed.  Head: Normocephalic, atraumatic.   Mallampati 2- crowded lower jaw,  neck circumference:14 inches.  Nasal airflow unrestricted.   TMJ click was not evident . Retrognathia is not seen.  Cardiovascular:  Regular rate and rhythm. Respiratory:  Bronchitic cough,  Skin:  Without evidence of edema, or rash Trunk: BMI is 25 kg/m2  Neurologic exam : The patient is awake and alert, oriented to place and time.    Memory subjective described as intact.   Speech is not pressured, is fluent, without dysarthria, dysphonia. No tremor in her voice.  Mood and affect are appropriate, she is conversant and seems happy. Attention is normal. Sense of taste and smell are reportedly intact.  Pupils are equal and briskly reactive to light and accomodation   Extraocular movements  in vertical and horizontal planes intact and without nystagmus. Visual fields by finger perimetry are intact.  Facial motor strength is symmetric and tongue and uvula move midline. Shoulder shrug was symmetrical.  Motor: intact strength, no focal weakness, symmetric muscle bulk and tone. Cooordination: no change in penmanship, no tremor.  Sensory: Intact to primary modalities. Normal width of gait and good balance.  DTR: symmetric,  brisk, no clonus. .    Assessment:  After physical and neurologic examination, review of laboratory studies,  Personal review of imaging studies, reports of other /same  Imaging studies ,   Results of polysomnography/ neurophysiology testing and pre-existing records as far as provided in visit.,  Dr. Paulla Fore, [facility] [place] [facility].  Dr. Yong Channel remains PCP.  Had a home sleep test,this was negative for OSA: quoted below-  She had quite a bit of PLM. Has RLS- we will start treating this now.  Dr Yong Channel has  already looked into electrolyte , TSH, Hep C, CBC and differential, all normal. I like to use a very low dose of a dopamine agonist, she is also already on neurontin 600 mg tid, prescribed by her new psychiatry provider.    My assessment is: Narcolepsy, in the setting of Bipolar mood disorder. HLA positive. xyrem was weaned off, she has felt better off the medication and has still gotten good sleep without daytime sleepiness. Takes: Nuvigil 250 mg.  PLMs were seen on her HST, but no apnea. She reports some RLS and dep leg ache.    Patient assured me that her depression is well controlled.    I spent more than 25 minutes of face to face time with the patient. Greater than 50% of time was spent in counseling and coordination of care. We have discussed the diagnosis and differential and I answered the patient's questions.    Increase Neurontin to 1200 mg at night time, will RV in 2-4 month and if she feels no improvements then we may use dopaminergic agonists.   Asencion Partridge Saher Davee MD  03/03/2021   CC: Marin Olp, Md [address] [place],  [place] [address]

## 2021-03-03 NOTE — Patient Instructions (Signed)

## 2021-03-14 ENCOUNTER — Encounter: Payer: Self-pay | Admitting: Family Medicine

## 2021-03-15 ENCOUNTER — Other Ambulatory Visit: Payer: Self-pay | Admitting: Family Medicine

## 2021-03-15 ENCOUNTER — Other Ambulatory Visit: Payer: Self-pay | Admitting: Neurology

## 2021-03-15 DIAGNOSIS — K219 Gastro-esophageal reflux disease without esophagitis: Secondary | ICD-10-CM

## 2021-03-15 MED ORDER — AMOXICILLIN-POT CLAVULANATE 875-125 MG PO TABS: 1.0000 | ORAL_TABLET | Freq: Two times a day (BID) | ORAL | 0 refills | Status: DC

## 2021-03-18 ENCOUNTER — Other Ambulatory Visit: Payer: BLUE CROSS/BLUE SHIELD

## 2021-03-18 ENCOUNTER — Encounter: Payer: Self-pay | Admitting: Neurology

## 2021-03-20 ENCOUNTER — Encounter: Payer: Self-pay | Admitting: Neurology

## 2021-03-21 ENCOUNTER — Telehealth: Payer: Self-pay | Admitting: Neurology

## 2021-03-21 NOTE — Telephone Encounter (Signed)
Attempted a PA for the patient and it states a active PA is already on file with expiration date of 02/15/2022. Please wait to resubmit request within 60 days of that expiration date to obtain a PA renewal. May need to complete a quanitity limit exception  Attempted a tier exception [id number] Will wait to hear if they accept. May have to call the pharmacy.

## 2021-03-24 ENCOUNTER — Ambulatory Visit: Payer: BLUE CROSS/BLUE SHIELD | Admitting: Cardiology

## 2021-03-24 NOTE — Progress Notes (Deleted)
Cardiology Office Note:    Date:  03/24/2021   ID:  Debra Nelson, Debra Nelson 08/20/1981, MRN [id number]  PCP:  Debra Olp, MD  Cardiologist:  Debra Heinz, MD  Electrophysiologist:  None   Referring MD: Debra Olp, MD   No chief complaint on file.   History of Present Illness:    Debra Nelson is a 40 y.o. female with a hx of bipolar disorder, PTSD, asthma, GERD, migraines, narcolepsy, anxiety who presents for follow-up.  She was referred by Dr. Yong Nelson for evaluation of palpitations, initially seen on 04/16/2020.  She had been following with Dr. Virgina Nelson for cardiac evaluation.  Work-up has included ETT with low exercise capacity (4.7 METS) but no ischemic changes on EKG.  Event monitor showed occasional PVCs.  TTE on 08/24/2019 showed LVEF 55%, no significant valvular disease.  She recently had an episode of syncope and presented to the ED for evaluation.  Work-up was unremarkable, was thought to be complex migraine.  She was started on metoprolol 25 mg twice daily for symptomatic PVCs.  Previously had been on propranol and diltiazem.  Reported worsening palpitations and metoprolol dose was increased to 37.5 mg twice daily.  Zio patch x3 days on 06/08/2020 showed no significant arrhythmias and low PVC burden (less than 1% of beats).  Since last clinic visit,  she reports that she is doing okay.  States that her palpitations are under good control.  Has been having issues recently with BPPV.  Improving with physical therapy.  Also recently diagnosed with fibromyalgia.  Reports occasional sharp stabbing pains on the left side of chest, not related to exertion.    Past Medical History:  Diagnosis Date  . Allergy   . Anemia    low normal- Iron once a week   . Anxiety   . Arthritis   . Asthma   . Bipolar disorder with depression ([place])    and PTSD  . Chicken pox   . Chronic migraine w/o aura, not intractable, w stat migr   . Depression   . DJD (degenerative joint  disease)    Severe of the right knee-Dr Creighton-ortho and Dr Gean Birchwood, PA-preferred pain managment  . GERD (gastroesophageal reflux disease)   . Narcolepsy   . Neuromuscular disorder (HCC)    Fibromyalgia  . PTSD (post-traumatic stress disorder)   . PVC (premature ventricular contraction)   . Seasonal allergic rhinitis     Past Surgical History:  Procedure Laterality Date  . knee surgery x5     08,11, 12, 13 x2- R knee  . NASAL TURBINATE REDUCTION Bilateral 06/2020  . right foot surgery     x 3, arthritis/bone spurus. plate placed.   . WISDOM TOOTH EXTRACTION      Current Medications: No outpatient medications have been marked as taking for the 03/24/21 encounter (Appointment) with Debra Heinz, MD.     Allergies:   Other, Klonopin [clonazepam], Phenergan [promethazine hcl], Promethazine, Promethazine hcl, and Topamax [topiramate]   Social History   Socioeconomic History  . Marital status: Married    Spouse name: [name]  . Number of children: 3  . Years of education: College  . Highest education level: Not on file  Occupational History    Employer: OTHER  Tobacco Use  . Smoking status: Never Smoker  . Smokeless tobacco: Never Used  Vaping Use  . Vaping Use: Never used  Substance and Sexual Activity  . Alcohol use: No  . Drug use: No  .  Sexual activity: Yes    Partners: Male    Birth control/protection: None  Other Topics Concern  . Not on file  Social History Narrative   Patient is married ([name]) and lives at home with her family   4 adults - 2 kids 50% of the time (1 son, 1 daughter). 2 older children (one age 32 from rape, then 60 year old- does not get to see- related to bipolar) Moved in with in laws to help them      Patient has 4 children (3 of her own and 1 step son).    Patient has a college education. Psychology and biology. Was paramedic.       Stay at home mom      Caffeine: most days none, maybe an 8 oz coke but rarely     Patient is right-handed.   Social Determinants of Health   Financial Resource Strain: Not on file  Food Insecurity: Not on file  Transportation Needs: Not on file  Physical Activity: Not on file  Stress: Not on file  Social Connections: Not on file     Family History: The patient's family history includes Cancer in her paternal grandfather; Headache in her brother; Heart murmur in her brother; Mental illness in her mother; Migraines in her mother and another family member; Other in her brother and father. There is no history of Colon cancer, Colon polyps, Esophageal cancer, Rectal cancer, or Stomach cancer.  ROS:   Please see the history of present illness.     All other systems reviewed and are negative.  EKGs/Labs/Other Studies Reviewed:    The following studies were reviewed today:  EKG:  EKG is ordered today.  The ekg ordered today demonstrates sinus rhythm, rate 80, no PVCs  Recent Labs: 07/30/2020: Magnesium 2.0 01/14/2021: ALT 11; BUN 8; Creat 0.77; Hemoglobin 14.2; Platelets 382; Potassium 4.4; Sodium 140; TSH 0.82  Recent Lipid Panel    Component Value Date/Time   CHOL 213 (H) 09/16/2020 1017   CHOL 170 01/30/2018 0915   TRIG 79 09/16/2020 1017   HDL 59 09/16/2020 1017   HDL 57 01/30/2018 0915   CHOLHDL 3.6 09/16/2020 1017   LDLCALC 136 (H) 09/16/2020 1017    Physical Exam:    VS:  There were no vitals taken for this visit.    Wt Readings from Last 3 Encounters:  03/03/21 174 lb (78.9 kg)  02/25/21 170 lb (77.1 kg)  01/25/21 171 lb (77.6 kg)     GEN:  Well nourished, well developed in no acute distress HEENT: Normal NECK: No JVD CARDIAC: RRR, no murmurs, rubs, gallops RESPIRATORY:  Clear to auscultation without rales, wheezing or rhonchi  ABDOMEN: Soft, non-tender, non-distended MUSCULOSKELETAL:  No edema; No deformity  SKIN: Warm and dry NEUROLOGIC:  Alert and oriented x 3 PSYCHIATRIC:  Normal affect   ASSESSMENT:    No diagnosis found. PLAN:     Palpitations/PVCs: work-up with with Dr. Virgina Nelson has included ETT with low exercise capacity (4.7 METS) but no ischemic changes on EKG.  Event monitor showed occasional PVCs.  TTE on 08/24/2019 showed LVEF 55%, no significant valvular disease.  Symptoms likely symptomatic PVCs, she was started on metoprolol 25 mg twice daily, has been titrated to 37.5 mg twice daily.  Zio patch x3 days on 06/08/2020 showed no significant arrhythmias and low PVC burden (less than 1% of beats).  Symptoms seem to correspond to PVCs, currently onToprol-XL 25 mg twice daily and reports palpitations under good control.  Will continue metoprolol.  Hyperlipidemia: 136 and 08/2020.  Low ASCVD risk score, no statin recommended at this time.  Diet/exercise recommended  RTC in 6 months  Medication Adjustments/Labs and Tests Ordered: Current medicines are reviewed at length with the patient today.  Concerns regarding medicines are outlined above.  No orders of the defined types were placed in this encounter.  No orders of the defined types were placed in this encounter.   There are no Patient Instructions on file for this visit.   Signed, Debra Heinz, MD  03/24/2021 1:10 PM    [facility]

## 2021-03-24 NOTE — Telephone Encounter (Signed)
I was provided a number to call the pharmacy, when they answered that was wrong number. They provided me with [facility] phone number [phone] to attempt [place] limit PA for the medication since it was over the 1 tab a day insurance coverage.  The person I spoke with states that a authorization form is not needed to be completed for this. She states the pharmacy has to call the pharmacy help desk and discuss the reason for over ride.  The number for the pharmacy is [phone] and their pharmacist will talk to a insurance pharmacist to discuss over riding the to allow coverage for 45 tablet per 30 days. I have forwarded this information to the patient to be able to provide this to her pharmacist.

## 2021-03-31 ENCOUNTER — Encounter: Payer: Self-pay | Admitting: Neurology

## 2021-04-04 ENCOUNTER — Ambulatory Visit
Admission: RE | Admit: 2021-04-04 | Discharge: 2021-04-04 | Disposition: A | Discharge status: Home / Self Care | Payer: BLUE CROSS/BLUE SHIELD | Source: Ambulatory Visit | Attending: Sports Medicine | Admitting: Sports Medicine

## 2021-04-04 DIAGNOSIS — M542 Cervicalgia: Secondary | ICD-10-CM

## 2021-04-06 ENCOUNTER — Telehealth: Payer: Self-pay | Admitting: Neurology

## 2021-04-06 ENCOUNTER — Other Ambulatory Visit: Payer: Self-pay | Admitting: Family Medicine

## 2021-04-06 DIAGNOSIS — K219 Gastro-esophageal reflux disease without esophagitis: Secondary | ICD-10-CM

## 2021-04-06 NOTE — Telephone Encounter (Signed)
[facility] called stating the pt is needing a refill on her Xyrem 500mg  Please advise.

## 2021-04-06 NOTE — Telephone Encounter (Signed)
pt no longer on Xyrem. This medication was discontinued

## 2021-04-27 ENCOUNTER — Other Ambulatory Visit: Payer: Self-pay

## 2021-04-27 ENCOUNTER — Encounter: Payer: Self-pay | Admitting: Physical Therapy

## 2021-04-27 ENCOUNTER — Ambulatory Visit: Payer: BLUE CROSS/BLUE SHIELD | Admitting: Physical Therapy

## 2021-04-27 DIAGNOSIS — M542 Cervicalgia: Secondary | ICD-10-CM | POA: Diagnosis not present

## 2021-04-27 NOTE — Patient Instructions (Signed)
Access Code: [email or web address] URL: [email or web address] Date: 04/27/2021 Prepared by: Lyndee Hensen  Exercises Standing Cervical Retraction - 2 x daily - 1 sets - 10 reps Seated Cervical Sidebending Stretch - 2 x daily - 3 reps - 30 hold Neck Flexion Stretch - 2 x daily - 3 reps - 10-20 hold

## 2021-04-28 ENCOUNTER — Telehealth: Payer: Self-pay | Admitting: *Deleted

## 2021-04-28 ENCOUNTER — Encounter: Payer: Self-pay | Admitting: Physical Therapy

## 2021-04-28 NOTE — Telephone Encounter (Signed)
Pt scheduled for a virtual PV at 1:30 pm for EGD on 05-12-21.  Attempted to reach pt at 1:30; no answer, LMOM  Attempted to reach at 1:40; no answer, LMOM.  Asked pt to call back by 5:00 pm to reschedule PV or we will have to cancel procedure on 05-12-21

## 2021-04-28 NOTE — Telephone Encounter (Signed)
Pt didn't call back to reschedule PV. EGD for 05-12-21 cancelled and NOS letter mailed to pt

## 2021-04-29 ENCOUNTER — Other Ambulatory Visit: Payer: Self-pay | Admitting: Family Medicine

## 2021-04-29 DIAGNOSIS — K219 Gastro-esophageal reflux disease without esophagitis: Secondary | ICD-10-CM

## 2021-05-02 ENCOUNTER — Encounter: Payer: BLUE CROSS/BLUE SHIELD | Admitting: Physical Therapy

## 2021-05-02 ENCOUNTER — Encounter: Payer: Self-pay | Admitting: Physical Therapy

## 2021-05-02 NOTE — Therapy (Signed)
[facility] [address] [place], [place], [address] Phone: [phone]   Fax:  [phone]  Physical Therapy Evaluation  Patient Details  Name: Debra Nelson MRN: [id number] Date of Birth: 10-22-1981 Referring Provider (PT): Teresa Coombs   Encounter Date: 04/27/2021   PT End of Session - 05/02/21 1057     Visit Number 1    Number of Visits 12    Date for PT Re-Evaluation 06/08/21    Authorization Type BCBS    PT Start Time 0933    PT Stop Time 1017    PT Time Calculation (min) 44 min    Activity Tolerance Patient tolerated treatment well    Behavior During Therapy [facility] for tasks assessed/performed             Past Medical History:  Diagnosis Date   Allergy    Anemia    low normal- Iron once a week    Anxiety    Arthritis    Asthma    Bipolar disorder with depression ([place])    and PTSD   Chicken pox    Chronic migraine w/o aura, not intractable, w stat migr    Depression    DJD (degenerative joint disease)    Severe of the right knee-Dr Creighton-ortho and Dr Gean Birchwood, PA-preferred pain managment   GERD (gastroesophageal reflux disease)    Narcolepsy    Neuromuscular disorder (HCC)    Fibromyalgia   PTSD (post-traumatic stress disorder)    PVC (premature ventricular contraction)    Seasonal allergic rhinitis     Past Surgical History:  Procedure Laterality Date   knee surgery x5     08,11, 12, 13 x2- R knee   NASAL TURBINATE REDUCTION Bilateral 06/2020   right foot surgery     x 3, arthritis/bone spurus. plate placed.    WISDOM TOOTH EXTRACTION      There were no vitals filed for this visit.    Subjective Assessment - 05/02/21 1055     Subjective Pt referred for neck pain. States ongoing pain in neck, R>L, has had recent imaging (negative). Pt active at home with kids and inlaws. She also states R sided trunk pain from this past Sunday when she was trying to tie the boat on dock. She has no  brusing, but thinks she pulled something, She is seeing Sports Med MD today.    Pertinent History baseline migraines. now taking emgality, headaches better.    Limitations House hold activities;Reading;Sitting;Standing    Diagnostic tests Recent MRI - cervical: ( - )    Patient Stated Goals Decreased pain    Currently in Pain? Yes    Pain Score 8     Pain Location Neck    Pain Orientation Right;Left    Pain Descriptors / Indicators Aching    Pain Type Chronic pain    Pain Radiating Towards up into base of head, down into bil UTs and shoulder blades    Pain Onset More than a month ago    Pain Frequency Intermittent    Aggravating Factors  increased activity , driving                [facility] PT Assessment - 05/02/21 0001       Assessment   Medical Diagnosis Neck pain    Referring Provider (PT) Teresa Coombs    Next MD Visit today / for thoracic pain    Prior Therapy no      Precautions  Precautions None      Balance Screen   Has the patient fallen in the past 6 months No      Prior Function   Level of Independence Independent      Cognition   Overall Cognitive Status Within Functional Limits for tasks assessed      Posture/Postural Control   Posture Comments Sitting: head SB to L,poor sitting postural awareness, fwd head, rounded shoulders.      ROM / Strength   AROM / PROM / Strength AROM;Strength      AROM   Overall AROM Comments Cervical: mild/mod limitation and pain for R SB and R rotation, thoracic/lumbar: significant limitation due to thoracic pain      Strength   Overall Strength Comments UE: 4/5, scapular: 4-/5      Palpation   Palpation comment Pain in bil cervical paraspinals, R >L, Soreness into R UT, bil sub occipitals,                        Objective measurements completed on examination: See above findings.       [facility] Adult PT Treatment/Exercise - 05/02/21 0001       Exercises   Exercises Neck      Neck Exercises:  Seated   Neck Retraction 10 reps    Shoulder Rolls 10 reps    Other Seated Exercise Scap retract x 10;      Neck Exercises: Stretches   Upper Trapezius Stretch 2 reps;20 seconds    Upper Trapezius Stretch Limitations seated    Levator Stretch 2 reps;20 seconds    Levator Stretch Limitations seated                    PT Education - 05/02/21 1057     Education Details PT POC, Exam findings, HEP    Person(s) Educated Patient    Methods Explanation;Demonstration;Tactile cues;Handout;Verbal cues    Comprehension Returned demonstration;Verbal cues required;Tactile cues required;Need further instruction              PT Short Term Goals - 05/02/21 1058       PT SHORT TERM GOAL #1   Title Pt to be independent with inital HEP    Time 2    Period Weeks    Status New    Target Date 05/11/21               PT Long Term Goals - 05/02/21 1058       PT LONG TERM GOAL #1   Title Pt to be independent with final HEP    Time 6    Period Weeks    Status New    Target Date 06/08/21      PT LONG TERM GOAL #2   Title Pt to report decreased pain in neck to 0-2/10 with activity and IADLs.    Time 6    Period Weeks    Status New    Target Date 06/08/21      PT LONG TERM GOAL #3   Title Pt to demo soft tissue limitations in cervical region to be WNL, to improve pain and ROM .    Time 6    Period Weeks    Status New    Target Date 06/08/21      PT LONG TERM GOAL #4   Title Pt to demo improved cervical ROM to be wnl for all motions to improve ability for driving and ADLs.  Time 6    Period Weeks    Status New    Target Date 06/08/21                    Plan - 05/02/21 1057     Clinical Impression Statement Pt presents with primary complaint of increased pain in neck. She has limited and painful ROM to the R with rotation and SB. Pt also with increased tightness and soreness in cervical musculature. She has poor postural awareness and strength. Pt  with neck pain as primary diagnosis, but also has had significant increase in R sided thoracic pain int he last couple days, with acute injury hold/tieing boat, for which she is being seen my ortho MD later today.  Pt with decreased ability for full functional activities, due to pain and dysnfunction, and will benefit from skilled PT to improve.    Personal Factors and Comorbidities Time since onset of injury/illness/exacerbation    Examination-Activity Limitations Lift;Reach Overhead;Carry;Sleep    Examination-Participation Restrictions Cleaning;Meal Prep;Yard Work;Community Activity;Driving;Shop    Stability/Clinical Decision Making Stable/Uncomplicated    Clinical Decision Making Low    Rehab Potential Good    PT Frequency 2x / week    PT Duration 6 weeks    PT Treatment/Interventions ADLs/Self Care Home Management;Cryotherapy;Electrical Stimulation;DME Instruction;Ultrasound;Traction;Moist Heat;Iontophoresis 4mg /ml Dexamethasone;Gait training;Stair training;Functional mobility training;Therapeutic activities;Therapeutic exercise;Patient/family education;Neuromuscular re-education;Manual techniques;Taping;Dry needling;Passive range of motion;Spinal Manipulations;Joint Manipulations    PT Home Exercise Plan [email or web address]    Consulted and Agree with Plan of Care Patient             Patient will benefit from skilled therapeutic intervention in order to improve the following deficits and impairments:  Pain, Improper body mechanics, Decreased mobility, Increased muscle spasms, Postural dysfunction, Impaired UE functional use, Decreased strength, Decreased range of motion, Decreased activity tolerance, Impaired flexibility  Visit Diagnosis: Cervicalgia     Problem List Patient Active Problem List   Diagnosis Date Noted   PLMD (periodic limb movement disorder) 03/03/2021   Snoring 02/22/2021   Witnessed apneic spells 02/22/2021   Chronic migraine w/o aura w/o status migrainosus, not  intractable 08/30/2020   Primary narcolepsy without cataplexy 08/25/2020   Other headache syndrome 08/25/2020   Hypoxemia associated with sleep 08/25/2020   Fibromyalgia 08/09/2020   Erroneous encounter - disregard 04/08/2020   Symptomatic PVCs 02/26/2020   Atypical chest pain 10/06/2019   Sinus tachycardia 08/27/2019   Shortness of breath 08/27/2019   Recurrent isolated sleep paralysis 07/02/2018   Menstrual migraine without status migrainosus, not intractable 01/02/2018   Chronic thumb pain, bilateral 11/23/2017   Elevated TSH 05/07/2017   Trigger thumb, left thumb 03/15/2017   Chronic post-traumatic stress disorder (PTSD) 02/26/2017   Sensorineural hearing loss (SNHL) of both ears 12/12/2016   Asthma 10/27/2016   GERD (gastroesophageal reflux disease) 10/27/2016   Seasonal allergic rhinitis 05/02/2016   Right ear pain 05/02/2016   Dysfunction of both eustachian tubes 05/02/2016   Migraine with aura and without status migrainosus, not intractable 10/19/2015   Narcolepsy and cataplexy 03/06/2014   Bipolar II disorder ([place]) 12/31/2013   Chondromalacia of knee 03/07/2013   Lyndee Hensen, PT, DPT 11:15 AM  05/02/21    [facility] [address] [place], [place], [address] Phone: [phone]   Fax:  [phone]  Name: Debra Nelson MRN: [id number] Date of Birth: 29-Dec-1980

## 2021-05-04 ENCOUNTER — Other Ambulatory Visit: Payer: Self-pay | Admitting: Neurology

## 2021-05-04 ENCOUNTER — Encounter: Payer: BLUE CROSS/BLUE SHIELD | Admitting: Physical Therapy

## 2021-05-04 NOTE — Telephone Encounter (Signed)
Prescription insurance: [facility] RX BIN [id number] PCN: [id number] Group: [id number] Member ID: [id number]  Called the insurance to check on the status of the PA appeal that was sent on 03/31/2021 as we never received a determination. They stated that the appeal was denied.

## 2021-05-05 ENCOUNTER — Other Ambulatory Visit: Payer: Self-pay | Admitting: Neurology

## 2021-05-05 ENCOUNTER — Telehealth: Payer: Self-pay | Admitting: Neurology

## 2021-05-05 MED ORDER — ARMODAFINIL 150 MG PO TABS: ORAL_TABLET | ORAL | 5 refills | Status: DC

## 2021-05-05 MED ORDER — ARMODAFINIL 250 MG PO TABS: 250.0000 mg | ORAL_TABLET | ORAL | 5 refills | Status: DC

## 2021-05-05 NOTE — Telephone Encounter (Signed)
Called the patient as requested per mychart message to discuss. Per her husband's conversation to [facility] they advised him that the armodafinil 150 mg dose would be treated as a separate script all together and that insurance should cover for it to be completed that way.  Informed her I would send the script to the pharmacy and complete PA for the different strength.  She is also asking if we can give approval for the pharmacy to allow refill of the 250 mg to be done earlier because they will be on a trip/cruise to [place] when it is due

## 2021-05-06 NOTE — Telephone Encounter (Signed)
Seems like the drug is already approved, just send in for 150 mg as requested. CD

## 2021-05-09 ENCOUNTER — Other Ambulatory Visit: Payer: Self-pay | Admitting: Neurology

## 2021-05-09 ENCOUNTER — Encounter: Payer: BLUE CROSS/BLUE SHIELD | Admitting: Physical Therapy

## 2021-05-11 ENCOUNTER — Encounter: Payer: BLUE CROSS/BLUE SHIELD | Admitting: Physical Therapy

## 2021-05-11 ENCOUNTER — Encounter: Payer: Self-pay | Admitting: Neurology

## 2021-05-12 ENCOUNTER — Encounter: Payer: BLUE CROSS/BLUE SHIELD | Admitting: Gastroenterology

## 2021-05-16 ENCOUNTER — Encounter: Payer: BLUE CROSS/BLUE SHIELD | Admitting: Physical Therapy

## 2021-05-18 ENCOUNTER — Other Ambulatory Visit: Payer: Self-pay | Admitting: Sports Medicine

## 2021-05-18 ENCOUNTER — Ambulatory Visit
Admission: RE | Admit: 2021-05-18 | Discharge: 2021-05-18 | Disposition: A | Discharge status: Home / Self Care | Payer: BLUE CROSS/BLUE SHIELD | Source: Ambulatory Visit | Attending: Sports Medicine | Admitting: Sports Medicine

## 2021-05-18 ENCOUNTER — Encounter: Payer: BLUE CROSS/BLUE SHIELD | Admitting: Physical Therapy

## 2021-05-18 ENCOUNTER — Other Ambulatory Visit: Payer: Self-pay

## 2021-05-18 DIAGNOSIS — R059 Cough, unspecified: Secondary | ICD-10-CM

## 2021-05-27 ENCOUNTER — Other Ambulatory Visit: Payer: Self-pay | Admitting: Family Medicine

## 2021-05-27 DIAGNOSIS — K219 Gastro-esophageal reflux disease without esophagitis: Secondary | ICD-10-CM

## 2021-05-30 ENCOUNTER — Ambulatory Visit: Payer: BLUE CROSS/BLUE SHIELD | Admitting: Neurology

## 2021-06-12 NOTE — Progress Notes (Deleted)
Cardiology Office Note:    Date:  06/12/2021   ID:  Debra, Nelson 13-Dec-1980, MRN [id number]  PCP:  Marin Olp, MD  Cardiologist:  Donato Heinz, MD  Electrophysiologist:  None   Referring MD: Marin Olp, MD   No chief complaint on file.   History of Present Illness:    Debra Nelson is a 40 y.o. female with a hx of bipolar disorder, PTSD, asthma, GERD, migraines, narcolepsy, anxiety who presents for follow-up.  She was referred by Dr. Yong Channel for evaluation of palpitations, initially seen on 04/16/2020.  She had been following with Dr. Virgina Jock for cardiac evaluation.  Work-up has included ETT with low exercise capacity (4.7 METS) but no ischemic changes on EKG.  Event monitor showed occasional PVCs.  TTE on 08/24/2019 showed LVEF 55%, no significant valvular disease.  She recently had an episode of syncope and presented to the ED for evaluation.  Work-up was unremarkable, was thought to be complex migraine.  She was started on metoprolol 25 mg twice daily for symptomatic PVCs.  Previously had been on propranol and diltiazem.  Reported worsening palpitations and metoprolol dose was increased to 37.5 mg twice daily.  Zio patch x3 days on 06/08/2020 showed no significant arrhythmias and low PVC burden (less than 1% of beats).  Since last clinic visit,  she reports that she is doing okay.  States that her palpitations are under good control.  Has been having issues recently with BPPV.  Improving with physical therapy.  Also recently diagnosed with fibromyalgia.  Reports occasional sharp stabbing pains on the left side of chest, not related to exertion.    Past Medical History:  Diagnosis Date   Allergy    Anemia    low normal- Iron once a week    Anxiety    Arthritis    Asthma    Bipolar disorder with depression (HCC)    and PTSD   Chicken pox    Chronic migraine w/o aura, not intractable, w stat migr    Depression    DJD (degenerative joint disease)     Severe of the right knee-Dr Creighton-ortho and Dr Gean Birchwood, PA-preferred pain managment   GERD (gastroesophageal reflux disease)    Narcolepsy    Neuromuscular disorder (HCC)    Fibromyalgia   PTSD (post-traumatic stress disorder)    PVC (premature ventricular contraction)    Seasonal allergic rhinitis     Past Surgical History:  Procedure Laterality Date   knee surgery x5     08,11, 12, 13 x2- R knee   NASAL TURBINATE REDUCTION Bilateral 06/2020   right foot surgery     x 3, arthritis/bone spurus. plate placed.    WISDOM TOOTH EXTRACTION      Current Medications: No outpatient medications have been marked as taking for the 06/13/21 encounter (Appointment) with Donato Heinz, MD.     Allergies:   Other, Klonopin [clonazepam], Phenergan [promethazine hcl], Promethazine, Promethazine hcl, and Topamax [topiramate]   Social History   Socioeconomic History   Marital status: Married    Spouse name: [name]   Number of children: 3   Years of education: College   Highest education level: Not on file  Occupational History    Employer: OTHER  Tobacco Use   Smoking status: Never   Smokeless tobacco: Never  Vaping Use   Vaping Use: Never used  Substance and Sexual Activity   Alcohol use: No   Drug use: No   Sexual  activity: Yes    Partners: Male    Birth control/protection: None  Other Topics Concern   Not on file  Social History Narrative   Patient is married ([name]) and lives at home with her family   4 adults - 2 kids 50% of the time (1 son, 1 daughter). 2 older children (one age 73 from rape, then 37 year old- does not get to see- related to bipolar) Moved in with in laws to help them      Patient has 4 children (3 of her own and 1 step son).    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   Social  Determinants of Health   Financial Resource Strain: Not on file  Food Insecurity: Not on file  Transportation Needs: Not on file  Physical Activity: Not on file  Stress: Not on file  Social Connections: Not on file     Family History: The patient's family history includes Cancer in her paternal grandfather; Headache in her brother; Heart murmur in her brother; Mental illness in her mother; Migraines in her mother and another family member; Other in her brother and father. There is no history of Colon cancer, Colon polyps, Esophageal cancer, Rectal cancer, or Stomach cancer.  ROS:   Please see the history of present illness.     All other systems reviewed and are negative.  EKGs/Labs/Other Studies Reviewed:    The following studies were reviewed today:  EKG:  EKG is ordered today.  The ekg ordered today demonstrates sinus rhythm, rate 80, no PVCs  Recent Labs: 07/30/2020: Magnesium 2.0 01/14/2021: ALT 11; BUN 8; Creat 0.77; Hemoglobin 14.2; Platelets 382; Potassium 4.4; Sodium 140; TSH 0.82  Recent Lipid Panel    Component Value Date/Time   CHOL 213 (H) 09/16/2020 1017   CHOL 170 01/30/2018 0915   TRIG 79 09/16/2020 1017   HDL 59 09/16/2020 1017   HDL 57 01/30/2018 0915   CHOLHDL 3.6 09/16/2020 1017   LDLCALC 136 (H) 09/16/2020 1017    Physical Exam:    VS:  There were no vitals taken for this visit.    Wt Readings from Last 3 Encounters:  03/03/21 174 lb (78.9 kg)  02/25/21 170 lb (77.1 kg)  01/25/21 171 lb (77.6 kg)     GEN:  Well nourished, well developed in no acute distress HEENT: Normal NECK: No JVD CARDIAC: RRR, no murmurs, rubs, gallops RESPIRATORY:  Clear to auscultation without rales, wheezing or rhonchi  ABDOMEN: Soft, non-tender, non-distended MUSCULOSKELETAL:  No edema; No deformity  SKIN: Warm and dry NEUROLOGIC:  Alert and oriented x 3 PSYCHIATRIC:  Normal affect   ASSESSMENT:    No diagnosis found.  PLAN:    Palpitations/PVCs: work-up with  with Dr. Virgina Jock has included ETT with low exercise capacity (4.7 METS) but no ischemic changes on EKG.  Event monitor showed occasional PVCs.  TTE on 08/24/2019 showed LVEF 55%, no significant valvular disease.  Symptoms likely symptomatic PVCs, she was started on metoprolol 25 mg twice daily, has been titrated to 37.5 mg twice daily.  Zio patch x3 days on 06/08/2020 showed no significant arrhythmias and low PVC burden (less than 1% of beats).  Symptoms seem to correspond to PVCs, currently onToprol-XL 25 mg twice daily and reports palpitations under good control.  Will continue metoprolol.  Hyperlipidemia: LDL 136 on 09/16/2020.  10-year ASCVD risk or less than 1%, no indication for statin at this time.  Diet and exercise recommended  RTC in ***  Medication Adjustments/Labs and Tests Ordered: Current medicines are reviewed at length with the patient today.  Concerns regarding medicines are outlined above.  No orders of the defined types were placed in this encounter.  No orders of the defined types were placed in this encounter.   There are no Patient Instructions on file for this visit.   Signed, Donato Heinz, MD  06/12/2021 2:56 PM    [place]

## 2021-06-13 ENCOUNTER — Other Ambulatory Visit: Payer: Self-pay

## 2021-06-13 ENCOUNTER — Encounter: Payer: Self-pay | Admitting: Cardiology

## 2021-06-13 ENCOUNTER — Ambulatory Visit: Payer: BLUE CROSS/BLUE SHIELD | Admitting: Cardiology

## 2021-06-13 VITALS — BP 116/80 | HR 76 | Resp 20 | Ht 61.0 in | Wt 182.6 lb

## 2021-06-13 DIAGNOSIS — R002 Palpitations: Secondary | ICD-10-CM

## 2021-06-13 DIAGNOSIS — I493 Ventricular premature depolarization: Secondary | ICD-10-CM | POA: Diagnosis not present

## 2021-06-13 DIAGNOSIS — E785 Hyperlipidemia, unspecified: Secondary | ICD-10-CM

## 2021-06-13 NOTE — Patient Instructions (Signed)

## 2021-06-13 NOTE — Progress Notes (Signed)
Cardiology Office Note:    Date:  06/13/2021   ID:  Debra Nelson, Debra Nelson 1981/02/01, MRN [id number]  PCP:  Marin Olp, MD  Cardiologist:  Donato Heinz, MD  Electrophysiologist:  None   Referring MD: Marin Olp, MD   Chief Complaint  Patient presents with   Palpitations     History of Present Illness:    Debra Nelson is a 40 y.o. female with a hx of bipolar disorder, PTSD, asthma, GERD, migraines, narcolepsy, anxiety who presents for follow-up.  She was referred by Dr. Yong Channel for evaluation of palpitations, initially seen on 04/16/2020.  She had been following with Dr. Virgina Jock for cardiac evaluation.  Work-up has included ETT with low exercise capacity (4.7 METS) but no ischemic changes on EKG.  Event monitor showed occasional PVCs.  TTE on 08/24/2019 showed LVEF 55%, no significant valvular disease.  She recently had an episode of syncope and presented to the ED for evaluation.  Work-up was unremarkable, was thought to be complex migraine.  She was started on metoprolol 25 mg twice daily for symptomatic PVCs.  Previously had been on propranol and diltiazem.  Reported worsening palpitations and metoprolol dose was increased to 37.5 mg twice daily.  Zio patch x3 days on 06/08/2020 showed no significant arrhythmias and low PVC burden (less than 1% of beats).  Since last clinic visit, she is doing okay. She has been experiencing fatigue due to recent travelling to [place] and [place]. She still has palpitations however they come occasionally and happens towards the end of the evening when she is about to take her Metoprolol. Palpitations do not last long. She includes she broke a rib 2 months ago. Her right fingers are swollen and has LE edema in her left ankle. She also experiences sweating when walking around her house and her skin feels clammy. She has occasional tremors and lightheadedness but denies syncope. She believes she has shortness of breath due to weight gain,  however she wants to start walking with her husband soon.  Shortness of breath when active. Past Medical History:  Diagnosis Date   Allergy    Anemia    low normal- Iron once a week    Anxiety    Arthritis    Asthma    Bipolar disorder with depression (HCC)    and PTSD   Chicken pox    Chronic migraine w/o aura, not intractable, w stat migr    Depression    DJD (degenerative joint disease)    Severe of the right knee-Dr Creighton-ortho and Dr Gean Birchwood, PA-preferred pain managment   GERD (gastroesophageal reflux disease)    Narcolepsy    Neuromuscular disorder (HCC)    Fibromyalgia   PTSD (post-traumatic stress disorder)    PVC (premature ventricular contraction)    Seasonal allergic rhinitis     Past Surgical History:  Procedure Laterality Date   knee surgery x5     08,11, 12, 13 x2- R knee   NASAL TURBINATE REDUCTION Bilateral 06/2020   right foot surgery     x 3, arthritis/bone spurus. plate placed.    WISDOM TOOTH EXTRACTION      Current Medications: Current Meds  Medication Sig   acetaminophen (TYLENOL) 500 MG tablet Take 500 mg by mouth every 6 (six) hours as needed.   albuterol (VENTOLIN HFA) 108 (90 Base) MCG/ACT inhaler SMARTSIG:2 Puff(s) By Mouth Every 4-6 Hours PRN   ALPRAZolam (XANAX XR) 1 MG 24 hr tablet Take 1 mg by  mouth daily.   APPLE CIDER VINEGAR PO Take by mouth in the morning and at bedtime. Pt takes 1 tablet in morning and 1 tablet at night.   Armodafinil 150 MG tablet Pt may take 1 tablet daily after lunch as needed   Armodafinil 250 MG tablet Take 1 tablet (250 mg total) by mouth every morning.   benzonatate (TESSALON) 200 MG capsule Take 200 mg by mouth 3 (three) times daily as needed for cough.   BREO ELLIPTA 200-25 MCG/INH AEPB Inhale 1 puff into the lungs daily.   busPIRone (BUSPAR) 30 MG tablet Take 30 mg by mouth 2 (two) times daily.   Cholecalciferol (VITAMIN D3) 50 MCG (2000 UT) TABS Take by mouth.   cyclobenzaprine (FLEXERIL) 10 MG  tablet Take 10 mg by mouth as needed.   desloratadine (CLARINEX) 5 MG tablet Take 5 mg by mouth every evening.    Diclofenac Sodium (PENNSAID EX) Apply topically as needed.   EPIPEN 2-PAK 0.3 MG/0.3ML SOAJ injection as needed.   escitalopram (LEXAPRO) 20 MG tablet Take 20 mg by mouth every morning.   ferrous sulfate 325 (65 FE) MG EC tablet Take 325 mg by mouth once a week.   fluticasone (FLONASE SENSIMIST) 27.5 MCG/SPRAY nasal spray Place 2 sprays into the nose daily.   gabapentin (NEURONTIN) 600 MG tablet One I AM and 1 at noon and 2 at bedtime.   Galcanezumab-gnlm (EMGALITY) 120 MG/ML SOAJ Inject 120 mg into the skin every 30 (thirty) days.   lamoTRIgine (LAMICTAL) 200 MG tablet Take 400 mg by mouth at bedtime.   loratadine (CLARITIN) 10 MG tablet Take 10 mg by mouth in the morning.    metoprolol succinate (TOPROL-XL) 50 MG 24 hr tablet TAKE 1/2 TABLET BY MOUTH TWICE A DAY   Multiple Vitamins-Minerals (HAIR SKIN AND NAILS FORMULA PO) Take 1 tablet by mouth daily.    olopatadine (PATANOL) 0.1 % ophthalmic solution as needed.    Olopatadine HCl 0.6 % SOLN Place 2 sprays into both nostrils 2 (two) times daily.   ondansetron (ZOFRAN-ODT) 4 MG disintegrating tablet TAKE 2 TABLETS BY MOUTH EVERY 8 HOURS AS NEEDED FOR NAUSEA OR VOMITING.   OVER THE COUNTER MEDICATION Take 1-2 tablets by mouth 2 (two) times daily. Magnesium L-theonate- 1 tab in am and 2 tablets at bedtime   oxyCODONE-acetaminophen (PERCOCET/ROXICET) 5-325 MG tablet Take 1 tablet by mouth 4 (four) times daily as needed.   pantoprazole (PROTONIX) 40 MG tablet TAKE 1 TABLET BY MOUTH EVERY DAY   Probiotic Product (PROBIOTIC DAILY PO) Take 1 tablet by mouth daily.   promethazine (PHENERGAN) 12.5 MG tablet TAKE 1 TABLET BY MOUTH EVERY 6 HOURS AS NEEDED FOR NAUSEA OR VOMITING.   QUEtiapine (SEROQUEL) 50 MG tablet Take 50 mg by mouth at bedtime.   Rimegepant Sulfate (NURTEC) 75 MG TBDP Take 75 mg by mouth daily as needed. For migraines.  Take as close to onset of migraine as possible. One daily maximum.   rOPINIRole (REQUIP) 0.5 MG tablet Take 0.5 mg by mouth at bedtime.   traMADol (ULTRAM) 50 MG tablet Take 50 mg by mouth 4 (four) times daily as needed.   Varenicline Tartrate (TYRVAYA) 0.03 MG/ACT SOLN Place 1 spray into the nose 2 (two) times daily.     Allergies:   Other, Klonopin [clonazepam], Phenergan [promethazine hcl], Promethazine, Promethazine hcl, and Topamax [topiramate]   Social History   Socioeconomic History   Marital status: Married    Spouse name: [name]   Number of  children: 3   Years of education: College   Highest education level: Not on file  Occupational History    Employer: OTHER  Tobacco Use   Smoking status: Never   Smokeless tobacco: Never  Vaping Use   Vaping Use: Never used  Substance and Sexual Activity   Alcohol use: No   Drug use: No   Sexual activity: Yes    Partners: Male    Birth control/protection: None  Other Topics Concern   Not on file  Social History Narrative   Patient is married ([name]) and lives at home with her family   4 adults - 2 kids 50% of the time (1 son, 1 daughter). 2 older children (one age 64 from rape, then 3 year old- does not get to see- related to bipolar) Moved in with in laws to help them      Patient has 4 children (3 of her own and 1 step son).    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   Social Determinants of Health   Financial Resource Strain: Not on file  Food Insecurity: Not on file  Transportation Needs: Not on file  Physical Activity: Not on file  Stress: Not on file  Social Connections: Not on file     Family History: The patient's family history includes Cancer in her paternal grandfather; Headache in her brother; Heart murmur in her brother; Mental illness in her mother; Migraines in  her mother and another family member; Other in her brother and father. There is no history of Colon cancer, Colon polyps, Esophageal cancer, Rectal cancer, or Stomach cancer.  ROS:   Please see the history of present illness.     (+) tremors  (+) lightheadedness (+) palpitations  (+) LE edema, located in left ankle (+) right fingers swelling (+) sweating (+) shortness of breath All other systems reviewed and are negative.  EKGs/Labs/Other Studies Reviewed:    The following studies were reviewed today:  EKG:   07/22: sinus rhythm, rate 76, no ST abnormalities 10/21: sinus rhythm, rate 80, no PVCs  Long term monitor 07/21: 3 days of data recorded on Zio monitor. Patient had a min HR of 52 bpm, max HR of 130 bpm, and avg HR of 77 bpm. Predominant underlying rhythm was Sinus Rhythm. No VT, SVT, atrial fibrillation, high degree block, or pauses noted. Isolated atrial and ventricular ectopy was rare (<1%). There were 16 triggered events, which corresponded to sinus rhythm  PVCs/PACs and ventricular trigeminy.  No significant arrhythmias detected.  Pcv cardia stress test 11/20:  Study Highlights Exercise treadmill stress test 10/06/2019: Exercise treadmill stress test performed using Bruce protocol.  Patient reached 4.7 METS, and 98% of age predicted maximum heart rate.  Exercise capacity was low.  No chest pain reported.  Exaggerated heart rate response, normal hemodynamic response. Stress EKG revealed no ischemic changes. Normal exercise treadmill stress test with low exercise capacity.  Pcv echo 10/20:  Echocardiogram 09/03/2019:  Left ventricle cavity is normal in size. Normal left ventricular wall  thickness. Normal LV systolic function with EF 55%. Normal global wall  motion. Normal diastolic filling pattern.  Mild tricuspid regurgitation.  No evidence of pulmonary hypertension.  Recent Labs: 07/30/2020: Magnesium 2.0 01/14/2021: ALT 11; BUN 8; Creat 0.77; Hemoglobin 14.2;  Platelets  382; Potassium 4.4; Sodium 140; TSH 0.82  Recent Lipid Panel    Component Value Date/Time   CHOL 213 (H) 09/16/2020 1017   CHOL 170 01/30/2018 0915   TRIG 79 09/16/2020 1017   HDL 59 09/16/2020 1017   HDL 57 01/30/2018 0915   CHOLHDL 3.6 09/16/2020 1017   LDLCALC 136 (H) 09/16/2020 1017    Physical Exam:    VS:  BP 116/80   Pulse 76   Resp 20   Ht '5\' 1"'$  (1.549 m)   Wt 182 lb 9.6 oz (82.8 kg)   SpO2 95%   BMI 34.50 kg/m     Wt Readings from Last 3 Encounters:  06/13/21 182 lb 9.6 oz (82.8 kg)  03/03/21 174 lb (78.9 kg)  02/25/21 170 lb (77.1 kg)     GEN:  Well nourished, well developed in no acute distress HEENT: Normal NECK: No JVD CARDIAC: RRR, no murmurs, rubs, gallops RESPIRATORY:  Clear to auscultation without rales, wheezing or rhonchi  ABDOMEN: Soft, non-tender, non-distended MUSCULOSKELETAL:  No edema; No deformity  SKIN: Warm and dry NEUROLOGIC:  Alert and oriented x 3 PSYCHIATRIC:  Normal affect   ASSESSMENT:    1. PVC's (premature ventricular contractions)   2. Palpitations   3. Hyperlipidemia, unspecified hyperlipidemia type     PLAN:    Palpitations/PVCs: work-up with with Dr. Virgina Jock has included ETT with low exercise capacity (4.7 METS) but no ischemic changes on EKG.  Event monitor showed occasional PVCs.  TTE on 08/24/2019 showed LVEF 55%, no significant valvular disease.  Symptoms likely symptomatic PVCs, she was started on metoprolol 25 mg twice daily, has been titrated to 37.5 mg twice daily.  Zio patch x3 days on 06/08/2020 showed no significant arrhythmias and low PVC burden (less than 1% of beats).  Symptoms seem to correspond to PVCs, currently onToprol-XL 25 mg twice daily and reports palpitations under good control.  Will continue metoprolol.  Hyperlipidemia: LDL 136 on 09/16/2020.  10-year ASCVD risk or less than 1%, no indication for statin at this time.  Diet and exercise recommended  RTC in 1 year  Medication  Adjustments/Labs and Tests Ordered: Current medicines are reviewed at length with the patient today.  Concerns regarding medicines are outlined above.  Orders Placed This Encounter  Procedures   EKG 12-Lead    No orders of the defined types were placed in this encounter.   Patient Instructions  Medication Instructions:  Your physician recommends that you continue on your current medications as directed. Please refer to the Current Medication list given to you today.  *If you need a refill on your cardiac medications before your next appointment, please call your pharmacy*  Follow-Up: At [facility], you and your health needs are our priority.  As part of our continuing mission to provide you with exceptional heart care, we have created designated Provider Care Teams.  These Care Teams include your primary Cardiologist (physician) and Advanced Practice Providers (APPs -  Physician Assistants and Nurse Practitioners) who all work together to provide you with the care you need, when you need it.  We recommend signing up for the patient portal called "MyChart".  Sign up information is provided on this After Visit Summary.  MyChart is used to connect with patients for Virtual Visits (Telemedicine).  Patients are able to view lab/test results, encounter notes, upcoming appointments, etc.  Non-urgent messages can be sent to your provider as well.   To learn more about what you can do with  MyChart, go to [email or web address].    Your next appointment:   12 month(s)  The format for your next appointment:   In Person  Provider:   Oswaldo Milian, MD      Ardell Isaacs as a scribe for Donato Heinz, MD.,have documented all relevant documentation on the behalf of Donato Heinz, MD,as directed by  Donato Heinz, MD while in the presence of Donato Heinz, MD.  I, Donato Heinz, MD, have reviewed all documentation for this visit.  The documentation on 06/13/21 for the exam, diagnosis, procedures, and orders are all accurate and complete.   Signed, Donato Heinz, MD  06/13/2021 5:22 PM    [facility]

## 2021-06-15 ENCOUNTER — Encounter: Payer: Self-pay | Admitting: Neurology

## 2021-06-16 ENCOUNTER — Encounter: Payer: Self-pay | Admitting: Physician Assistant

## 2021-06-16 ENCOUNTER — Telehealth (INDEPENDENT_AMBULATORY_CARE_PROVIDER_SITE_OTHER): Payer: BLUE CROSS/BLUE SHIELD | Admitting: Physician Assistant

## 2021-06-16 DIAGNOSIS — J0110 Acute frontal sinusitis, unspecified: Secondary | ICD-10-CM | POA: Diagnosis not present

## 2021-06-16 MED ORDER — AMOXICILLIN-POT CLAVULANATE 875-125 MG PO TABS: 1.0000 | ORAL_TABLET | Freq: Two times a day (BID) | ORAL | 0 refills | Status: AC

## 2021-06-16 MED ORDER — PREDNISONE 20 MG PO TABS: 20.0000 mg | ORAL_TABLET | Freq: Two times a day (BID) | ORAL | 0 refills | Status: AC

## 2021-06-16 NOTE — Progress Notes (Signed)
Virtual Visit via Video Note  I connected with Debra Nelson on 06/16/21 at  1:30 PM EDT by a video enabled telemedicine application and verified that I am speaking with the correct person using two identifiers.  Location: Patient: home Provider: Therapist, music at [place] present: Patient and myself   I discussed the limitations of evaluation and management by telemedicine and the availability of in person appointments. The patient expressed understanding and agreed to proceed.   History of Present Illness: Chief complaint: Headache and frontal sinus pressure Symptom onset: One week Pertinent positives: Ear popping Pertinent negatives: Fever, chills, N/V/D, ST Treatments tried: Heating pad, Claritin and Clarinex  Vaccine status: Pfizer with 2 boosters Sick exposure: None, but says she has been traveling and has been exposed to different allergens   COVID-19 test at home negative in the last week    Observations/Objective:   Gen: Awake, alert, no acute distress Resp: Breathing is even and non-labored Psych: calm/pleasant demeanor Neuro: Alert and Oriented x 3, + facial symmetry, speech is clear.   Assessment and Plan: 1. Acute frontal sinusitis, recurrence not specified Persistent symptoms despite conservative efforts at home. Will Rx Augmentin at this time, take with food. Prednisone for additional relief. Cautioned on antibiotic use and possible side effects. Advised nasal saline, humidifier, and pushing fluids. Call if worse or no improvement.    Follow Up Instructions:    I discussed the assessment and treatment plan with the patient. The patient was provided an opportunity to ask questions and all were answered. The patient agreed with the plan and demonstrated an understanding of the instructions.   The patient was advised to call back or seek an in-person evaluation if the symptoms worsen or if the condition fails to improve as  anticipated.  Yaslyn Cumby M Cathryn Gallery, PA-C

## 2021-06-16 NOTE — Patient Instructions (Signed)
Take medications as directed. Call if any changes.

## 2021-06-20 ENCOUNTER — Other Ambulatory Visit: Payer: Self-pay | Admitting: Family Medicine

## 2021-06-20 DIAGNOSIS — K219 Gastro-esophageal reflux disease without esophagitis: Secondary | ICD-10-CM

## 2021-06-21 ENCOUNTER — Other Ambulatory Visit: Payer: Self-pay | Admitting: Neurology

## 2021-06-21 DIAGNOSIS — F3181 Bipolar II disorder: Secondary | ICD-10-CM

## 2021-06-21 DIAGNOSIS — G47411 Narcolepsy with cataplexy: Secondary | ICD-10-CM

## 2021-06-21 DIAGNOSIS — G43109 Migraine with aura, not intractable, without status migrainosus: Secondary | ICD-10-CM

## 2021-06-26 ENCOUNTER — Encounter: Payer: Self-pay | Admitting: Family Medicine

## 2021-06-29 ENCOUNTER — Other Ambulatory Visit: Payer: Self-pay

## 2021-06-29 MED ORDER — DOXYCYCLINE HYCLATE 100 MG PO CAPS: 100.0000 mg | ORAL_CAPSULE | Freq: Two times a day (BID) | ORAL | 3 refills | Status: DC

## 2021-06-29 NOTE — Telephone Encounter (Signed)
Gave pt message below. She voiced understanding, and gives Dr. Yong Channel her Thanks. She has not had any issues with Doxycycline. Refill was sent to pharmacy.

## 2021-07-06 ENCOUNTER — Other Ambulatory Visit: Payer: Self-pay | Admitting: Sports Medicine

## 2021-07-06 ENCOUNTER — Other Ambulatory Visit: Payer: Self-pay

## 2021-07-06 ENCOUNTER — Ambulatory Visit
Admission: RE | Admit: 2021-07-06 | Discharge: 2021-07-06 | Disposition: A | Discharge status: Home / Self Care | Payer: BLUE CROSS/BLUE SHIELD | Source: Ambulatory Visit | Attending: Sports Medicine | Admitting: Sports Medicine

## 2021-07-06 DIAGNOSIS — R52 Pain, unspecified: Secondary | ICD-10-CM

## 2021-07-13 ENCOUNTER — Other Ambulatory Visit: Payer: Self-pay | Admitting: Family Medicine

## 2021-07-13 DIAGNOSIS — K219 Gastro-esophageal reflux disease without esophagitis: Secondary | ICD-10-CM

## 2021-07-14 ENCOUNTER — Other Ambulatory Visit: Payer: Self-pay | Admitting: Sports Medicine

## 2021-07-14 DIAGNOSIS — M79671 Pain in right foot: Secondary | ICD-10-CM

## 2021-07-30 ENCOUNTER — Ambulatory Visit
Admission: RE | Admit: 2021-07-30 | Discharge: 2021-07-30 | Disposition: A | Discharge status: Home / Self Care | Payer: BLUE CROSS/BLUE SHIELD | Source: Ambulatory Visit | Attending: Sports Medicine | Admitting: Sports Medicine

## 2021-07-30 ENCOUNTER — Other Ambulatory Visit: Payer: Self-pay

## 2021-07-30 DIAGNOSIS — M79671 Pain in right foot: Secondary | ICD-10-CM

## 2021-07-31 ENCOUNTER — Other Ambulatory Visit: Payer: Self-pay | Admitting: Neurology

## 2021-08-02 ENCOUNTER — Telehealth (INDEPENDENT_AMBULATORY_CARE_PROVIDER_SITE_OTHER): Payer: BLUE CROSS/BLUE SHIELD | Admitting: Adult Health

## 2021-08-02 ENCOUNTER — Encounter: Payer: Self-pay | Admitting: Adult Health

## 2021-08-02 ENCOUNTER — Telehealth: Payer: BLUE CROSS/BLUE SHIELD | Admitting: Adult Health

## 2021-08-02 DIAGNOSIS — G2581 Restless legs syndrome: Secondary | ICD-10-CM | POA: Diagnosis not present

## 2021-08-02 DIAGNOSIS — G47411 Narcolepsy with cataplexy: Secondary | ICD-10-CM | POA: Diagnosis not present

## 2021-08-02 DIAGNOSIS — G43709 Chronic migraine without aura, not intractable, without status migrainosus: Secondary | ICD-10-CM

## 2021-08-02 NOTE — Progress Notes (Signed)
PATIENT: Debra Nelson DOB: 1981-11-03  REASON FOR VISIT: follow up HISTORY FROM: patient  Virtual Visit via Video Note  I connected with Debra Nelson on 08/02/21 at 11:30 AM EDT by a video enabled telemedicine application located remotely at [facility] and verified that I am speaking with the correct person using two identifiers who was located at their own home.   I discussed the limitations of evaluation and management by telemedicine and the availability of in person appointments. The patient expressed understanding and agreed to proceed.   PATIENT: Debra Nelson DOB: [birth year]  REASON FOR VISIT: follow up HISTORY FROM: patient  HISTORY OF PRESENT ILLNESS: Today 08/02/21:  Debra Nelson is a 40 year old female with a history of migraine headaches, narcolepsy and restless legs.  She returns today for follow-up.  She reports that Emgality continues to work well for her headaches.  She has noticed significant decrease in the frequency.  She is currently on armodafinil 250 mg daily.  She states that she was also given 150 mg prescription but had to have this filled at another pharmacy so insurance would give it to her?  She states that she was taking the 250 mg in the 150 mg.  I advised the patient that I do not see this in Debra Nelson note.  Patient is currently taking Requip 0.5 mg at bedtime this has been prescribed by another provider.  She reports that it works well for her.  She returns today for an evaluation.  HISTORY (Copied from Debra Nelson's note) 03-03-2021: RV after report of feeling "overdrugged" on Xyrem, has weaned off  Xyrem and feels great !  Last seen by Debra Dick, NP in March 28th, 2022.  She had been doing well on XYREM for years before- since 2014.  Now has developed a lower tolerance for the side effects or has been no longer helped.  No more parasomnia, but more RLS? Marland Kitchen  She is getting better on sleep on Zyprexa,   Now  seeing; Debra Nelson.  Bipolar depression-  Debra Nelson, Sports and Performance Medicine- [facility] [place].  Debra Nelson remains PCP.  Had a home sleep test,this was negative for OSA: quoted below-  She had quite a bit of PLM. Has RLS- we will start treating this now.  Dr Yong Nelson has already looked into electrolyte , TSH, Hep C, CBC and differential, all normal. I initially wanted to use a very low dose of a dopamine agonist, but she is also already on neurontin 600 mg tid, prescribed by her new psychiatry provider. Lets up the night time dose.    REVIEW OF SYSTEMS: Out of a complete 14 system review of symptoms, the patient complains only of the following symptoms, and all other reviewed systems are negative.  ALLERGIES: Allergies  Allergen Reactions   Other Other (See Comments)    Other reaction(s): muscle spasms   Klonopin [Clonazepam] Nausea Only   Phenergan [Promethazine Hcl]     IV causes muscle tics, may take oral, or give benadryl prior to IV   Promethazine Other (See Comments)    She cannot take IV phenergan unless this it is given with Benadryl.   Promethazine Hcl Other (See Comments)    IV causes muscle tics, may take oral, or give benadryl prior to IV   Topamax [Topiramate] Nausea And Vomiting    confusion    HOME MEDICATIONS: Outpatient Medications Prior to Visit  Medication Sig Dispense Refill   acetaminophen (  TYLENOL) 500 MG tablet Take 500 mg by mouth every 6 (six) hours as needed.     albuterol (VENTOLIN HFA) 108 (90 Base) MCG/ACT inhaler SMARTSIG:2 Puff(s) By Mouth Every 4-6 Hours PRN     ALPRAZolam (XANAX XR) 1 MG 24 hr tablet Take 1 mg by mouth daily.     APPLE CIDER VINEGAR PO Take by mouth in the morning and at bedtime. Pt takes 1 tablet in morning and 1 tablet at night.     Armodafinil 150 MG tablet Pt may take 1 tablet daily after lunch as needed (Patient not taking: Reported on 08/02/2021) 30 tablet 5   Armodafinil 250 MG tablet Take 1 tablet (250  mg total) by mouth every morning. 30 tablet 5   benzonatate (TESSALON) 200 MG capsule Take 200 mg by mouth 3 (three) times daily as needed for cough.     BREO ELLIPTA 200-25 MCG/INH AEPB Inhale 1 puff into the lungs daily.     busPIRone (BUSPAR) 30 MG tablet Take 30 mg by mouth 2 (two) times daily.     Cholecalciferol (VITAMIN D3) 50 MCG (2000 UT) TABS Take by mouth.     cyclobenzaprine (FLEXERIL) 10 MG tablet Take 10 mg by mouth as needed.     desloratadine (CLARINEX) 5 MG tablet Take 5 mg by mouth every evening.      Diclofenac Sodium (PENNSAID EX) Apply topically as needed.     doxycycline (VIBRAMYCIN) 100 MG capsule Take 1 capsule (100 mg total) by mouth 2 (two) times daily. 14 capsule 3   EPIPEN 2-PAK 0.3 MG/0.3ML SOAJ injection as needed.     escitalopram (LEXAPRO) 20 MG tablet Take 20 mg by mouth every morning.     ferrous sulfate 325 (65 FE) MG EC tablet Take 325 mg by mouth once a week.     fluticasone (FLONASE SENSIMIST) 27.5 MCG/SPRAY nasal spray Place 2 sprays into the nose daily.     gabapentin (NEURONTIN) 600 MG tablet One I AM and 1 at noon and 2 at bedtime. 120 tablet 5   Galcanezumab-gnlm (EMGALITY) 120 MG/ML SOAJ Inject 120 mg into the skin every 30 (thirty) days. 1.12 mL 11   lamoTRIgine (LAMICTAL) 200 MG tablet Take 400 mg by mouth at bedtime.     loratadine (CLARITIN) 10 MG tablet Take 10 mg by mouth in the morning.      metoprolol succinate (TOPROL-XL) 50 MG 24 hr tablet TAKE 1/2 TABLET BY MOUTH TWICE A DAY 30 tablet 11   Multiple Vitamins-Minerals (HAIR SKIN AND NAILS FORMULA PO) Take 1 tablet by mouth daily.      olopatadine (PATANOL) 0.1 % ophthalmic solution as needed.      Olopatadine HCl 0.6 % SOLN Place 2 sprays into both nostrils 2 (two) times daily.     ondansetron (ZOFRAN-ODT) 4 MG disintegrating tablet TAKE 2 TABLETS BY MOUTH EVERY 8 HOURS AS NEEDED FOR NAUSEA OR VOMITING. 40 tablet 0   OVER THE COUNTER MEDICATION Take 1-2 tablets by mouth 2 (two) times daily.  Magnesium L-theonate- 1 tab in am and 2 tablets at bedtime     oxyCODONE-acetaminophen (PERCOCET/ROXICET) 5-325 MG tablet Take 1 tablet by mouth 4 (four) times daily as needed.     pantoprazole (PROTONIX) 40 MG tablet TAKE 1 TABLET BY MOUTH EVERY DAY 30 tablet 0   Probiotic Product (PROBIOTIC DAILY PO) Take 1 tablet by mouth daily.     promethazine (PHENERGAN) 12.5 MG tablet TAKE 1 TABLET BY MOUTH EVERY 6 HOURS  AS NEEDED FOR NAUSEA OR VOMITING. 30 tablet 0   QUEtiapine (SEROQUEL) 50 MG tablet Take 50 mg by mouth at bedtime.     Rimegepant Sulfate (NURTEC) 75 MG TBDP Take 75 mg by mouth daily as needed. For migraines. Take as close to onset of migraine as possible. One daily maximum. 10 tablet 6   rOPINIRole (REQUIP) 0.5 MG tablet Take 0.5 mg by mouth at bedtime.     traMADol (ULTRAM) 50 MG tablet Take 50 mg by mouth 4 (four) times daily as needed.     Varenicline Tartrate (TYRVAYA) 0.03 MG/ACT SOLN Place 1 spray into the nose 2 (two) times daily.     No facility-administered medications prior to visit.    PAST MEDICAL HISTORY: Past Medical History:  Diagnosis Date   Allergy    Anemia    low normal- Iron once a week    Anxiety    Arthritis    Asthma    Bipolar disorder with depression (HCC)    and PTSD   Chicken pox    Chronic migraine w/o aura, not intractable, w stat migr    Depression    DJD (degenerative joint disease)    Severe of the right knee-Dr Creighton-ortho and Dr Gean Birchwood, Nelson-preferred pain managment   GERD (gastroesophageal reflux disease)    Narcolepsy    Neuromuscular disorder (HCC)    Fibromyalgia   PTSD (post-traumatic stress disorder)    PVC (premature ventricular contraction)    Seasonal allergic rhinitis     PAST SURGICAL HISTORY: Past Surgical History:  Procedure Laterality Date   knee surgery x5     08,11, 12, 13 x2- R knee   NASAL TURBINATE REDUCTION Bilateral 06/2020   right foot surgery     x 3, arthritis/bone spurus. plate placed.     WISDOM TOOTH EXTRACTION      FAMILY HISTORY: Family History  Problem Relation Age of Onset   Mental illness Mother        does not speak to regularly so does not know full history   Migraines Mother    Other Father        medical issues, divorce related   Other Brother        ptsd- fully discharged medically disabled before age 38   Heart murmur Brother    Headache Brother        cluster   Cancer Paternal Grandfather        type unknown   Migraines Other    Colon cancer Neg Hx    Colon polyps Neg Hx    Esophageal cancer Neg Hx    Rectal cancer Neg Hx    Stomach cancer Neg Hx     SOCIAL HISTORY: Social History   Socioeconomic History   Marital status: Married    Spouse name: [name]   Number of children: 3   Years of education: College   Highest education level: Not on file  Occupational History    Employer: OTHER  Tobacco Use   Smoking status: Never   Smokeless tobacco: Never  Vaping Use   Vaping Use: Never used  Substance and Sexual Activity   Alcohol use: No   Drug use: No   Sexual activity: Yes    Partners: Male    Birth control/protection: None  Other Topics Concern   Not on file  Social History Narrative   Patient is married ([name]) and lives at home with her family   4 adults - 2 kids 50%  of the time (1 son, 1 daughter). 2 older children (one age 39 from rape, then 98 year old- does not get to see- related to bipolar) Moved in with in laws to help them      Patient has 4 children (3 of her own and 1 step son).    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   Social Determinants of Health   Financial Resource Strain: Not on file  Food Insecurity: Not on file  Transportation Needs: Not on file  Physical Activity: Not on file  Stress: Not on file  Social Connections: Not on file  Intimate Partner Violence:  Not on file      PHYSICAL EXAM Generalized: Well developed, in no acute distress   Neurological examination  Mentation: Alert oriented to time, place, history taking. Follows all commands speech and language fluent Cranial nerve II-XII:Extraocular movements were full. Facial symmetry noted. uvula tongue midline. Head turning and shoulder shrug  were normal and symmetric. Motor: Good strength throughout subjectively per patient Sensory: Sensory testing is intact to soft touch on all 4 extremities subjectively per patient Coordination: Cerebellar testing reveals good finger-nose-finger  Gait and station: UTA patient is in car as passenger Reflexes: UTA  DIAGNOSTIC DATA (LABS, IMAGING, TESTING) - I reviewed patient records, labs, notes, testing and imaging myself where available.  Lab Results  Component Value Date   WBC 9.1 01/14/2021   HGB 14.2 01/14/2021   HCT 41.8 01/14/2021   MCV 92.3 01/14/2021   PLT 382 01/14/2021      Component Value Date/Time   NA 140 01/14/2021 1647   NA 138 04/16/2020 1145   K 4.4 01/14/2021 1647   CL 102 01/14/2021 1647   CO2 27 01/14/2021 1647   GLUCOSE 86 01/14/2021 1647   BUN 8 01/14/2021 1647   BUN 11 04/16/2020 1145   CREATININE 0.77 01/14/2021 1647   CALCIUM 9.7 01/14/2021 1647   PROT 6.6 01/14/2021 1647   PROT 7.2 07/10/2019 1008   ALBUMIN 4.3 01/04/2021 1348   ALBUMIN 5.2 (H) 07/10/2019 1008   AST 11 01/14/2021 1647   ALT 11 01/14/2021 1647   ALKPHOS 96 01/04/2021 1348   BILITOT 0.5 01/14/2021 1647   BILITOT 0.7 07/10/2019 1008   GFRNONAA 96 01/14/2021 1647   GFRAA 111 01/14/2021 1647   Lab Results  Component Value Date   CHOL 213 (H) 09/16/2020   HDL 59 09/16/2020   LDLCALC 136 (H) 09/16/2020   TRIG 79 09/16/2020   CHOLHDL 3.6 09/16/2020   Lab Results  Component Value Date   HGBA1C 4.9 01/30/2018   Lab Results  Component Value Date   VITAMINB12 >2000 (H) 01/30/2018   Lab Results  Component Value Date   TSH 0.82  01/14/2021      ASSESSMENT AND PLAN 40 y.o. year old female  has a past medical history of Allergy, Anemia, Anxiety, Arthritis, Asthma, Bipolar disorder with depression ([place]), Chicken pox, Chronic migraine w/o aura, not intractable, w stat migr, Depression, DJD (degenerative joint disease), GERD (gastroesophageal reflux disease), Narcolepsy, Neuromuscular disorder ([place]), PTSD (post-traumatic stress disorder), PVC (premature ventricular contraction), and Seasonal allergic rhinitis. here with:  1.  Migraine headaches  -- Continue Emgality monthly injection --Continue Nurtec for abortive therapy  2.  Narcolepsy  --Continue armodafinil 250 mg  daily  3.  Restless leg syndrome  --Currently on Requip 0.5 mg at bedtime-prescribed by another provider  Advised if her symptoms worsen or she develops new symptoms she should let us know follow-up in 6 months or sooner if needed   I spent 15 minutes with the patient. 50% of this time was spent   Ward Givens, MSN, NP-C 08/02/2021, 11:43 AM [facility] [address] [place], [place] [address] [phone]

## 2021-08-08 ENCOUNTER — Ambulatory Visit: Payer: BLUE CROSS/BLUE SHIELD | Admitting: Neurology

## 2021-08-09 ENCOUNTER — Other Ambulatory Visit: Payer: Self-pay | Admitting: Family Medicine

## 2021-08-09 DIAGNOSIS — K219 Gastro-esophageal reflux disease without esophagitis: Secondary | ICD-10-CM

## 2021-08-24 ENCOUNTER — Telehealth: Payer: Self-pay

## 2021-08-24 ENCOUNTER — Other Ambulatory Visit: Payer: Self-pay | Admitting: Neurology

## 2021-08-24 NOTE — Telephone Encounter (Signed)
I submitted a PA for Emgality on CMM, Key: [id number] - PA Case ID: [id number]  Approved [id number];[id number];Review Type:Prior Auth;Coverage Start Date:07/25/2021;Coverage End Date:08/24/2022;

## 2021-08-25 ENCOUNTER — Encounter: Payer: Self-pay | Admitting: Neurology

## 2021-08-25 ENCOUNTER — Other Ambulatory Visit: Payer: Self-pay | Admitting: Neurology

## 2021-08-25 MED ORDER — ROPINIROLE HCL 0.5 MG PO TABS: ORAL_TABLET | ORAL | 5 refills | Status: DC

## 2021-09-07 ENCOUNTER — Other Ambulatory Visit: Payer: Self-pay | Admitting: Sports Medicine

## 2021-09-07 ENCOUNTER — Ambulatory Visit
Admission: RE | Admit: 2021-09-07 | Discharge: 2021-09-07 | Disposition: A | Discharge status: Home / Self Care | Payer: BLUE CROSS/BLUE SHIELD | Source: Ambulatory Visit | Attending: Sports Medicine | Admitting: Sports Medicine

## 2021-09-07 DIAGNOSIS — M79671 Pain in right foot: Secondary | ICD-10-CM

## 2021-09-09 ENCOUNTER — Other Ambulatory Visit: Payer: Self-pay | Admitting: Neurology

## 2021-09-10 ENCOUNTER — Other Ambulatory Visit: Payer: Self-pay | Admitting: Family Medicine

## 2021-09-10 DIAGNOSIS — K219 Gastro-esophageal reflux disease without esophagitis: Secondary | ICD-10-CM

## 2021-09-17 ENCOUNTER — Other Ambulatory Visit: Payer: Self-pay | Admitting: Neurology

## 2021-10-04 ENCOUNTER — Other Ambulatory Visit: Payer: Self-pay

## 2021-10-04 ENCOUNTER — Ambulatory Visit
Admission: RE | Admit: 2021-10-04 | Discharge: 2021-10-04 | Disposition: A | Discharge status: Home / Self Care | Payer: BLUE CROSS/BLUE SHIELD | Source: Ambulatory Visit | Attending: Sports Medicine | Admitting: Sports Medicine

## 2021-10-04 ENCOUNTER — Other Ambulatory Visit: Payer: Self-pay | Admitting: Sports Medicine

## 2021-10-04 DIAGNOSIS — R0602 Shortness of breath: Secondary | ICD-10-CM

## 2021-10-04 DIAGNOSIS — R071 Chest pain on breathing: Secondary | ICD-10-CM

## 2021-10-11 ENCOUNTER — Other Ambulatory Visit: Payer: Self-pay | Admitting: Family Medicine

## 2021-10-11 DIAGNOSIS — K219 Gastro-esophageal reflux disease without esophagitis: Secondary | ICD-10-CM

## 2021-10-25 ENCOUNTER — Telehealth: Payer: Self-pay | Admitting: *Deleted

## 2021-10-25 NOTE — Telephone Encounter (Signed)
Completed Nurtec PA on Cover My Meds. Key: [id number]. Approved immediately by [employer].    [id number];[id number];Review Type:Prior Auth;Coverage Start Date:09/25/2021;Coverage End Date:10/25/2022;

## 2021-10-27 ENCOUNTER — Other Ambulatory Visit: Payer: Self-pay | Admitting: Neurology

## 2021-10-27 ENCOUNTER — Other Ambulatory Visit: Payer: Self-pay | Admitting: Cardiology

## 2021-11-02 ENCOUNTER — Other Ambulatory Visit: Payer: Self-pay | Admitting: Family Medicine

## 2021-11-02 DIAGNOSIS — K219 Gastro-esophageal reflux disease without esophagitis: Secondary | ICD-10-CM

## 2021-11-03 ENCOUNTER — Ambulatory Visit: Payer: BLUE CROSS/BLUE SHIELD | Admitting: Physical Therapy

## 2021-11-09 ENCOUNTER — Ambulatory Visit (INDEPENDENT_AMBULATORY_CARE_PROVIDER_SITE_OTHER): Payer: BLUE CROSS/BLUE SHIELD | Admitting: Obstetrics & Gynecology

## 2021-11-09 ENCOUNTER — Other Ambulatory Visit: Payer: Self-pay

## 2021-11-09 ENCOUNTER — Other Ambulatory Visit (HOSPITAL_COMMUNITY)
Admission: RE | Admit: 2021-11-09 | Discharge: 2021-11-09 | Disposition: A | Discharge status: Home / Self Care | Payer: BLUE CROSS/BLUE SHIELD | Source: Ambulatory Visit | Attending: Obstetrics & Gynecology | Admitting: Obstetrics & Gynecology

## 2021-11-09 ENCOUNTER — Encounter: Payer: Self-pay | Admitting: Obstetrics & Gynecology

## 2021-11-09 VITALS — BP 111/72 | HR 91 | Ht 62.0 in | Wt 175.6 lb

## 2021-11-09 DIAGNOSIS — F3281 Premenstrual dysphoric disorder: Secondary | ICD-10-CM

## 2021-11-09 DIAGNOSIS — Z01419 Encounter for gynecological examination (general) (routine) without abnormal findings: Secondary | ICD-10-CM | POA: Diagnosis not present

## 2021-11-09 DIAGNOSIS — Z1231 Encounter for screening mammogram for malignant neoplasm of breast: Secondary | ICD-10-CM

## 2021-11-09 DIAGNOSIS — N939 Abnormal uterine and vaginal bleeding, unspecified: Secondary | ICD-10-CM

## 2021-11-09 DIAGNOSIS — Z01411 Encounter for gynecological examination (general) (routine) with abnormal findings: Secondary | ICD-10-CM | POA: Diagnosis not present

## 2021-11-09 DIAGNOSIS — N393 Stress incontinence (female) (male): Secondary | ICD-10-CM

## 2021-11-09 NOTE — Patient Instructions (Signed)
Please schedule a mammogram at one of the following locations:  [place]: [phone]  [facility] [place]:[phone] [address]  

## 2021-11-09 NOTE — Progress Notes (Signed)
WELL-WOMAN EXAMINATION Patient name: Debra Nelson MRN [id number]  Date of birth: November 21, 1980 Chief Complaint:   Gynecologic Exam and New Patient (Initial Visit)  History of Present Illness:   Debra Nelson is a 41 y.o.  [phone] female being seen today for a routine well-woman exam.  Today she notes the following concerns:  Urinary incontinence: This has been an ongoing issue.  Notes considerable stress incontinence.  Reports not a small leak, but large leak with every cough or sneeze.  Denies significant caffeine.  Of note, she is drinking more water as part of her weight loss journey.  Denies urge incontinence.  Denies dysuria or hematuria.   Abnormal uterine bleeding: Menses are somewhat irregular- typically every 3 wks.  Sometimes light, other times HMB for 4-5 days.  Denies intermenstrual bleeding.  PMDD/perimenopausal symptoms: This has been an ongoing issue for years since I saw her back at [facility] [facility].  She reports what seems like cyclical pelvic pain, mood changes and vasomotor symptoms. Prior to vasectomy, she had struggled with hormonal contraception- states that hormones just don't do well for her.  Tried patch and per pt other medication in the past. Due to this difficulty, she previously had copper IUD for contraception.  Pt also notes that she has been in the process of transitioning off a lot of her prior medications (Buspar, Seroquel) and others.  Currently being treated with alternative therapy- Ketamine  Prior [facility] patient- seen yearly since 2016  Contraception: vasectomy   Patient's last menstrual period was 10/31/2021 (exact date). Denies issues with her menses The current method of family planning is vasectomy.    Last pap 2016.  Last mammogram: 2020. Last colonoscopy: n/a  Depression screen [facility] 2/9 11/09/2021 01/14/2021 04/09/2020 03/23/2020 08/25/2019  Decreased Interest 2 3 0 0 1  Down, Depressed, Hopeless 1 3 0 2 1  PHQ - 2 Score 3 6 0 2 2  Altered sleeping 3  3 0 0 0  Tired, decreased energy 3 3 3 1  0  Change in appetite 3 3 0 0 1  Feeling bad or failure about yourself  0 3 0 0 0  Trouble concentrating 2 3 0 0 3  Moving slowly or fidgety/restless 3 3 0 0 0  Suicidal thoughts 0 0 0 0 0  PHQ-9 Score 17 24 3 3 6   Difficult doing work/chores - Extremely dIfficult Not difficult at all Not difficult at all Somewhat difficult  Some recent data might be hidden      Review of Systems:   Pertinent items are noted in HPI Denies any headaches, blurred vision, fatigue, shortness of breath, chest pain, abdominal pain, bowel movements, or intercourse unless otherwise stated above.  Pertinent History Reviewed:  Reviewed past medical,surgical, social and family history.  Reviewed problem list, medications and allergies. Physical Assessment:   Vitals:   11/09/21 1529  BP: 111/72  Pulse: 91  Weight: 175 lb 9.6 oz (79.7 kg)  Height: 5\' 2"  (1.575 m)  Body mass index is 32.12 kg/m.        Physical Examination:   General appearance - well appearing, and in no distress  Mental status - alert, oriented to person, place, and time  Psych:  She has a normal mood and affect  Skin - warm and dry, normal color, no suspicious lesions noted  Chest - effort normal, all lung fields clear to auscultation bilaterally  Heart - normal rate and regular rhythm  Neck:  midline trachea, no thyromegaly or nodules  Breasts - breasts appear normal, no suspicious masses, no skin or nipple changes or  axillary nodes  Abdomen - soft, nontender, nondistended, no masses or organomegaly  Pelvic - VULVA: normal appearing vulva with no masses, tenderness or lesions  VAGINA: normal appearing vagina with normal color and discharge, no lesions  CERVIX: normal appearing cervix without discharge or lesions, no CMT  Thin prep pap is done with HR HPV cotesting  UTERUS: uterus is felt to be normal size, shape, consistency and nontender   ADNEXA: No adnexal masses or tenderness  noted.  Extremities:  No swelling or varicosities noted  Chaperone: Network engineer & Plan:  1) Well-Woman Exam -pap collected, reviewed screening guidelines -mammogram ordered- continue yearly screening  2) PMDD/Vasomotor symptoms -based on symptoms- would expect hormone therapy to be helpful; however due to past experiences and migraines with aura this would not be ideal -advised conservative options including continued weight loss, regular exercise and reviewed herbal supplements to try  3) AUB -again hormonal therapy would be an option- declined -discussed endometrial ablation- however this would only control amount, not the change in frequency -briefly discussed hysterectomy -pt felt like her primary problem focus was on her urinary issues  4) Stress incontinence -reviewed conservative options -referral to Urogyn- pt wishes to look into surgical options   Follow-up:As needed or if conservative therapy does not work as well as anticipated   Janyth Pupa, DO Attending [place], Product/process development scientist for [employer], [place]

## 2021-11-16 ENCOUNTER — Other Ambulatory Visit: Payer: Self-pay | Admitting: Neurology

## 2021-11-16 LAB — CYTOLOGY - PAP
Comment: NEGATIVE
Diagnosis: NEGATIVE
High risk HPV: NEGATIVE

## 2021-11-17 ENCOUNTER — Ambulatory Visit: Payer: BLUE CROSS/BLUE SHIELD | Admitting: Physical Therapy

## 2021-11-18 ENCOUNTER — Encounter: Payer: Self-pay | Admitting: Cardiology

## 2021-11-24 ENCOUNTER — Other Ambulatory Visit: Payer: Self-pay | Admitting: Family Medicine

## 2021-11-24 DIAGNOSIS — K219 Gastro-esophageal reflux disease without esophagitis: Secondary | ICD-10-CM

## 2021-11-29 ENCOUNTER — Encounter: Payer: Self-pay | Admitting: Family Medicine

## 2021-11-29 NOTE — Telephone Encounter (Signed)
Patient has been scheduled

## 2021-11-30 ENCOUNTER — Ambulatory Visit: Payer: BLUE CROSS/BLUE SHIELD | Admitting: Physical Therapy

## 2021-11-30 ENCOUNTER — Encounter: Payer: Self-pay | Admitting: Physical Therapy

## 2021-11-30 ENCOUNTER — Other Ambulatory Visit: Payer: Self-pay

## 2021-11-30 DIAGNOSIS — M25511 Pain in right shoulder: Secondary | ICD-10-CM

## 2021-11-30 DIAGNOSIS — M6281 Muscle weakness (generalized): Secondary | ICD-10-CM

## 2021-11-30 DIAGNOSIS — G8929 Other chronic pain: Secondary | ICD-10-CM | POA: Diagnosis not present

## 2021-11-30 NOTE — Therapy (Addendum)
OUTPATIENT PHYSICAL THERAPY SHOULDER EVALUATION   Patient Name: Debra Nelson MRN: [id number] DOB:[birth year], 41 y.o., female Today's Date: 11/30/2021   PT End of Session - 11/30/21 1402     Visit Number 1    Number of Visits 12    Date for PT Re-Evaluation 01/11/22    Authorization Type [employer]    PT Start Time 1305    PT Stop Time 1350    PT Time Calculation (min) 45 min    Activity Tolerance Patient tolerated treatment well    Behavior During Therapy WFL for tasks assessed/performed             Past Medical History:  Diagnosis Date   Allergy    Anemia    low normal- Iron once a week    Anxiety    Arthritis    Asthma    Bipolar disorder with depression (HCC)    and PTSD   Chicken pox    Chronic migraine w/o aura, not intractable, w stat migr    Depression    DJD (degenerative joint disease)    Severe of the right knee-Dr Creighton-ortho and Dr Gean Birchwood, PA-preferred pain managment   Eustachian tube disorder, bilateral    Hearing loss and vertigo   GERD (gastroesophageal reflux disease)    Narcolepsy    Neuromuscular disorder (HCC)    Fibromyalgia   PTSD (post-traumatic stress disorder)    PVC (premature ventricular contraction)    Rosacea    Seasonal allergic rhinitis    Past Surgical History:  Procedure Laterality Date   knee surgery x5     08,11, 12, 13 x2- R knee   NASAL TURBINATE REDUCTION Bilateral 06/2020   right foot surgery     x 3, arthritis/bone spurus. plate placed.    WISDOM TOOTH EXTRACTION     Patient Active Problem List   Diagnosis Date Noted   PLMD (periodic limb movement disorder) 03/03/2021   Snoring 02/22/2021   Witnessed apneic spells 02/22/2021   Chronic migraine w/o aura w/o status migrainosus, not intractable 08/30/2020   Primary narcolepsy without cataplexy 08/25/2020   Other headache syndrome 08/25/2020   Hypoxemia associated with sleep 08/25/2020   Fibromyalgia 08/09/2020   Erroneous encounter - disregard 04/08/2020    Symptomatic PVCs 02/26/2020   Atypical chest pain 10/06/2019   Sinus tachycardia 08/27/2019   Shortness of breath 08/27/2019   Recurrent isolated sleep paralysis 07/02/2018   Menstrual migraine without status migrainosus, not intractable 01/02/2018   Chronic thumb pain, bilateral 11/23/2017   Elevated TSH 05/07/2017   Trigger thumb, left thumb 03/15/2017   Chronic post-traumatic stress disorder (PTSD) 02/26/2017   Sensorineural hearing loss (SNHL) of both ears 12/12/2016   Asthma 10/27/2016   GERD (gastroesophageal reflux disease) 10/27/2016   Seasonal allergic rhinitis 05/02/2016   Right ear pain 05/02/2016   Dysfunction of both eustachian tubes 05/02/2016   Migraine with aura and without status migrainosus, not intractable 10/19/2015   Narcolepsy and cataplexy 03/06/2014   Bipolar II disorder ([place]) 12/31/2013   Chondromalacia of knee 03/07/2013    PCP: Marin Olp, MD  REFERRING PROVIDER: Gerda Diss, DO  REFERRING DIAG: R shoulder pain  THERAPY DIAG:  Chronic right shoulder pain  Muscle weakness (generalized)   ONSET DATE:   SUBJECTIVE:  SUBJECTIVE STATEMENT: Pt states ongoing pain in R shoulder for years, multiple dislocations. Pain has been on/off, but increased pain in last 2 months, no injury to report. Thinks she has lost strength in shoulder and hand, and ROM. R handed.   PERTINENT HISTORY: Multiple dislocations,   PAIN:  Are you having pain? Yes NPRS scale: 7 /10 Pain location: shoulder /anterior  Pain orientation: Right  PAIN TYPE: aching Pain description: intermittent  Aggravating factors: ROM, elevation, reaching, carrying.  Relieving factors: Rest,   PRECAUTIONS: None  WEIGHT BEARING RESTRICTIONS No  FALLS:  Has patient fallen in last 6 months? No Number  of falls: 0   OCCUPATION: Pt not working   PLOF: Independent  PATIENT GOALS   decreased pain in shoulder   OBJECTIVE:   DIAGNOSTIC FINDINGS:  None  COGNITION:  Overall cognitive status: Within functional limits for tasks assessed  POSTURE:  unremarkable    PALPATION:  pain in anterior shoulder and around superior GHJ line , into pec, tenderness into deltoid    UPPER EXTREMITY AROM/PROM:  AROM Right 11/30/2021 Left 11/30/2021  Shoulder flexion 130   Shoulder extension    Shoulder abduction 110   Shoulder adduction    Shoulder internal rotation wfl   Shoulder external rotation wfl                                   (Blank rows = not tested)  UPPER EXTREMITY MMT:  MMT Right 11/30/2021 Left 11/30/2021  Shoulder flexion 4   Shoulder extension    Shoulder abduction 4   Shoulder adduction    IR 4+   ER 4                                   Grip strength (lbs) 45 53  (Blank rows = not tested)  SHOULDER SPECIAL TESTS:    JOINT MOBILITY TESTING:  Hypomobile R GHJ    TODAY'S TREATMENT:  11/30/21:  Therapeutic Exercise:  Aerobic: Supine: Flexion AAROM/cane x 15 Seated: Standing:  Bil shoulder ER RTB x 15; Scap squeezes x 10; Scaption AROM x 10;  Stretches:   Neuromuscular Re-education: Manual Therapy:  STM/TPR to R pec . Therapeutic Activity: Self Care: Trigger Point Dry Needling:  Modalities:     PATIENT EDUCATION: Education details: PT POC, Exam findings, HEP Person educated: Patient Education method: Explanation, Demonstration, Tactile cues, Verbal cues, and Handouts Education comprehension: verbalized understanding, returned demonstration, verbal cues required, tactile cues required, and needs further education   HOME EXERCISE PROGRAM: Access Code: [email or web address] URL: [email or web address] Date: 11/30/2021 Prepared by: Lyndee Hensen  Exercises Supine Shoulder Flexion with Dowel - 1 x daily - 1 sets - 10  reps Standing Shoulder External Rotation with Resistance - 1 x daily - 1- 2 sets - 10 reps Standing Scapular Retraction - 1 x daily - 1 sets - 10 reps Standing Shoulder Scaption - 1 x daily - 1 sets - 10 reps   ASSESSMENT:  CLINICAL IMPRESSION: 11/30/21:  Pt presents with primary complaint of increased pain in R shoulder. She has significant soreness in shoulder today with ROM, testing and activities. She has tenderness in superior/anterior shoulder, and pain with most motions. She does have some limitation for full flexion and abd with mild joint stiffness, and limited ability for retraction, horiz add due to pain. She has findings consistent  with labral pathology, and has had multiple dislocations in the past. Pt with weakness in shoulder girdle, and decreased endurance for sustaining activity due to weakness and pain. Pt to benefit from skilled PT to improve deficits and pain.   Objective impairments include decreased activity tolerance, decreased mobility, decreased strength, hypomobility, increased muscle spasms, and pain. These impairments are limiting patient from cleaning, community activity, driving, meal prep, laundry, and shopping. Personal factors including Past/current experiences and Time since onset of injury/illness/exacerbation are also affecting patient's functional outcome. Patient will benefit from skilled PT to address above impairments and improve overall function.  REHAB POTENTIAL: Good  CLINICAL DECISION MAKING: Stable/uncomplicated  EVALUATION COMPLEXITY: Low   GOALS: Goals reviewed with patient? Yes .SHORT TERM GOALS:  STG Name Target Date Goal status  1 Patient will be independent with initial HEP   12/14/21 INITIAL  2 Pt to demo improved AROM of shoulder for flexion by at least 10 deg.  12/14/21 INITIAL         LONG TERM GOALS:   LTG Name Target Date Goal status  1 Patient will be independent with final HEP  01/11/22 INITIAL  2 Pt to report decreased pain in  shoulder to 0-2/10 with reaching, lifting, carrying to improve ability for IADLs.   01/11/22 INITIAL  3 Pt to demonstrate improved strength of shoulder to be at least 4+/5, to improve ability for lift, carry and overhead activity.   01/11/22 INITIAL  4     5          PLAN: PT FREQUENCY: 1-2x/week  PT DURATION: 6 weeks  PLANNED INTERVENTIONS: Therapeutic exercises, Therapeutic activity, Neuro Muscular re-education, Patient/Family education, Joint mobilization, Dry Needling, Electrical stimulation, Spinal mobilization, Cryotherapy, Moist heat, Taping, Vasopneumatic device, Traction, Ultrasound, Ionotophoresis 4mg /ml Dexamethasone, and Manual therapy  PLAN FOR NEXT SESSION: progress strength/stabilization, manual for pain.   Lyndee Hensen, PT, DPT 2:03 PM  11/30/21   PHYSICAL THERAPY DISCHARGE SUMMARY  Visits from Start of Care: 1 Plan: Patient agrees to discharge.  Patient goals were not met. Patient is being discharged due to - pt not returning since last visit.     Lyndee Hensen, PT, DPT 10:40 AM  01/23/22

## 2021-12-08 ENCOUNTER — Encounter: Payer: BLUE CROSS/BLUE SHIELD | Admitting: Physical Therapy

## 2021-12-12 ENCOUNTER — Encounter: Payer: BLUE CROSS/BLUE SHIELD | Admitting: Physical Therapy

## 2021-12-12 ENCOUNTER — Telehealth: Payer: Self-pay

## 2021-12-12 NOTE — Telephone Encounter (Signed)
Patient states she currently has what she believes to be a sinus infection.  States she has an appt at 1pm.  Would like Lauren to give her a call in regard.

## 2021-12-15 ENCOUNTER — Other Ambulatory Visit: Payer: Self-pay

## 2021-12-15 ENCOUNTER — Encounter: Payer: Self-pay | Admitting: Family Medicine

## 2021-12-15 ENCOUNTER — Ambulatory Visit (INDEPENDENT_AMBULATORY_CARE_PROVIDER_SITE_OTHER): Payer: BLUE CROSS/BLUE SHIELD | Admitting: Family Medicine

## 2021-12-15 VITALS — BP 112/70 | HR 83 | Wt 169.2 lb

## 2021-12-15 DIAGNOSIS — K219 Gastro-esophageal reflux disease without esophagitis: Secondary | ICD-10-CM | POA: Diagnosis not present

## 2021-12-15 DIAGNOSIS — Z79899 Other long term (current) drug therapy: Secondary | ICD-10-CM | POA: Diagnosis not present

## 2021-12-15 DIAGNOSIS — F3181 Bipolar II disorder: Secondary | ICD-10-CM | POA: Diagnosis not present

## 2021-12-15 DIAGNOSIS — E785 Hyperlipidemia, unspecified: Secondary | ICD-10-CM

## 2021-12-15 DIAGNOSIS — D509 Iron deficiency anemia, unspecified: Secondary | ICD-10-CM

## 2021-12-15 MED ORDER — CLOTRIMAZOLE 10 MG MT TROC: 10.0000 mg | Freq: Every day | OROMUCOSAL | 0 refills | Status: DC

## 2021-12-15 NOTE — Progress Notes (Signed)
Phone [phone] In person visit   Subjective:   Debra Nelson is a 41 y.o. year old very pleasant female patient who presents for/with See problem oriented charting Chief Complaint  Patient presents with   Asthma    Refuses to wear mask    This visit occurred during the SARS-CoV-2 public health emergency.  Safety protocols were in place, including screening questions prior to the visit, additional usage of staff PPE, and extensive cleaning of exam room while observing appropriate contact time as indicated for disinfecting solutions.   Past Medical History-  Patient Active Problem List   Diagnosis Date Noted   Fibromyalgia 08/09/2020    Priority: High   Chronic post-traumatic stress disorder (PTSD) 02/26/2017    Priority: High   Narcolepsy and cataplexy 03/06/2014    Priority: High   Bipolar II disorder ([place]) 12/31/2013    Priority: High   Symptomatic PVCs 02/26/2020    Priority: Medium    Asthma 10/27/2016    Priority: Medium    GERD (gastroesophageal reflux disease) 10/27/2016    Priority: Medium    Migraine with aura and without status migrainosus, not intractable 10/19/2015    Priority: Medium    Recurrent isolated sleep paralysis 07/02/2018    Priority: Low   Menstrual migraine without status migrainosus, not intractable 01/02/2018    Priority: Low   Chronic thumb pain, bilateral 11/23/2017    Priority: Low   Elevated TSH 05/07/2017    Priority: Low   Trigger thumb, left thumb 03/15/2017    Priority: Low   Sensorineural hearing loss (SNHL) of both ears 12/12/2016    Priority: Low   Seasonal allergic rhinitis 05/02/2016    Priority: Low   Right ear pain 05/02/2016    Priority: Low   Dysfunction of both eustachian tubes 05/02/2016    Priority: Low   Chondromalacia of knee 03/07/2013    Priority: Low   PLMD (periodic limb movement disorder) 03/03/2021   Snoring 02/22/2021   Witnessed apneic spells 02/22/2021   Chronic migraine w/o aura w/o status  migrainosus, not intractable 08/30/2020   Primary narcolepsy without cataplexy 08/25/2020   Other headache syndrome 08/25/2020   Hypoxemia associated with sleep 08/25/2020   Erroneous encounter - disregard 04/08/2020   Atypical chest pain 10/06/2019   Sinus tachycardia 08/27/2019   Shortness of breath 08/27/2019    Medications- reviewed and updated Current Outpatient Medications  Medication Sig Dispense Refill   albuterol (VENTOLIN HFA) 108 (90 Base) MCG/ACT inhaler SMARTSIG:2 Puff(s) By Mouth Every 4-6 Hours PRN     ALPRAZolam (XANAX XR) 1 MG 24 hr tablet Take 1 mg by mouth daily.     amoxicillin-clavulanate (AUGMENTIN) 875-125 MG tablet Take 1 tablet by mouth 2 (two) times daily.     Armodafinil 250 MG tablet TAKE 1 TABLET BY MOUTH EVERY MORNING 30 tablet 5   budesonide-formoterol (SYMBICORT) 160-4.5 MCG/ACT inhaler Inhale 2 puffs into the lungs 2 (two) times daily.     clotrimazole (MYCELEX) 10 MG troche Take 1 tablet (10 mg total) by mouth 5 (five) times daily. 70 Troche 0   cyclobenzaprine (FLEXERIL) 10 MG tablet Take 10 mg by mouth as needed.     cycloSPORINE (CEQUA OP) Apply to eye.     desloratadine (CLARINEX) 5 MG tablet Take 5 mg by mouth every evening.      EMGALITY 120 MG/ML SOAJ INJECT 120 MG INTO THE SKIN EVERY 30 (THIRTY) DAYS. 1 mL 2   fexofenadine (ALLEGRA) 60 MG tablet Take 60  mg by mouth 2 (two) times daily.     fluticasone (FLONASE SENSIMIST) 27.5 MCG/SPRAY nasal spray Place 2 sprays into the nose daily.     lamoTRIgine (LAMICTAL) 200 MG tablet Take 400 mg by mouth at bedtime.     MAGNESIUM PO Take by mouth.     melatonin 3 MG TABS tablet Take 3 mg by mouth at bedtime.     Melatonin 5 MG CAPS Take by mouth.     metoprolol succinate (TOPROL-XL) 50 MG 24 hr tablet TAKE 1/2 TABLET BY MOUTH TWICE A DAY 90 tablet 3   Multiple Vitamins-Minerals (HAIR SKIN AND NAILS FORMULA PO) Take 1 tablet by mouth daily.      NURTEC 75 MG TBDP TAKE 1 TABLET BY MOUTH DAILY AS NEEDED. AS  CLOSE TO ONSET OF MIGRAINE AS POSSIBLE. **MAX 1 TAB/DAY** 8 tablet 8   Nutritional Supplements (ESTROVEN PO) Take by mouth.     olopatadine (PATANOL) 0.1 % ophthalmic solution as needed.      ondansetron (ZOFRAN-ODT) 4 MG disintegrating tablet TAKE 2 TABLETS BY MOUTH EVERY 8 HOURS AS NEEDED FOR NAUSEA OR VOMITING. 40 tablet 0   pantoprazole (PROTONIX) 40 MG tablet TAKE 1 TABLET BY MOUTH EVERY DAY 30 tablet 0   Probiotic Product (PROBIOTIC DAILY PO) Take 1 tablet by mouth daily.     traMADol (ULTRAM) 50 MG tablet Take 50 mg by mouth 4 (four) times daily as needed.     ACETAMINOPHEN PO Take 650 mg by mouth every 6 (six) hours as needed.     EPIPEN 2-PAK 0.3 MG/0.3ML SOAJ injection as needed.     Olopatadine HCl 0.6 % SOLN Place 2 sprays into both nostrils 2 (two) times daily.     OVER THE COUNTER MEDICATION Take 1-2 tablets by mouth 2 (two) times daily. Magnesium L-theonate- 1 tab in am and 2 tablets at bedtime     promethazine (PHENERGAN) 12.5 MG tablet TAKE 1 TABLET BY MOUTH EVERY 6 HOURS AS NEEDED FOR NAUSEA OR VOMITING. (Patient not taking: Reported on 12/15/2021) 30 tablet 0   UNABLE TO FIND Take 50 mg by mouth in the morning, at noon, and at bedtime. Med Name: Ketamine     No current facility-administered medications for this visit.     Objective:  BP 112/70 (BP Location: Left Arm, Patient Position: Sitting)    Pulse 83    Wt 169 lb 3.2 oz (76.7 kg)    SpO2 98%    BMI 30.95 kg/m  Gen: NAD, resting comfortably Tympanic membrane's normal, and posterior pharynx some diffuse white specks/plaques as well noted concerning for thrush, nasal turbinates edematous with yellow discharge and some dried blood CV: RRR no murmurs rubs or gallops Lungs: CTAB no crackles, wheeze, rhonchi Ext: no edema Skin: warm, dry     Assessment and Plan    # Sinus congestion.  S:Symptoms started around Christmas with nasal congestion and progressed to cough and chest congestion. Was started on steroids through  online visit for bronchitis and sinuses helped as well (100% resolved)- symptoms worsened after stopping steroids(stopped around the 6th of January- took 5 days from 1st to 5th )and then sinuses worsened even though lungs were better.  Symptoms started back around 13th- sinus pressure and thick discharge- green. Had some ear pressure- still getting ear pressure  Started on augmentin on Saturday through online visit- mild to moderate relief in symptoms but has loose stools with it. Has seen Dr. Benjamine Mola in the past. Periods  A/P:  Patient diagnosed with sinusitis through an online visit and is on day 5 approximately of her 10-day course with some improvement.  Does appear she developed thrush possibly from prior prednisone use.  We are going to continue Augmentin and add clotrimazole troche for thrush.  Would prefer to avoid Levaquin and we discussed using doxycycline as backup for atypical coverage if fails to continue to improve or symptoms worsen  #GERD S: Medication: Protonix 40 mg daily, Pepcid over-the-counter - had to be on both last visit- was able to come off pepcid and do half of protonix until got ill recently.  A/P: much improved/well-controlled- continue protonix 40mg  with flares but otherwise can do half tablet.    % Bipolar II disorder/PTSD/anxiety managed Dr.  Brett Albino out of [place], [place] S: Medication: lamictal 400mg , on ketamine 3x a day trauma therapy and has been able to reduce other meds! Feels more mentally clear -off seroquel -off gabapentin -off lexapro - off buspirone  Non psych changes - off half of metoprolol - -off requip A/P: Bipolar she reports doing better overall despite reduction in meds. No thougths of self harm. Will continue close follow up with psychiatry -Thrilled by progress and reduction in medication! -Lungs are clear.  Does not seem to be an asthma flare. -Considered another round of prednisone since she responded well to this for her sinuses but with  thrush and incomplete course of antibiotics opted to hold off for now  % MSK concerns- follows with Dr. Paulla Fore.  Likely Fibromyalgia-apparently ketamine has some benefit for fibromyalgia as well and patient has noted much less frequent visits to Dr. Paulla Fore.  She is thrilled with her overall progress  #hyperlipidemia S: Medication: None.  Patient does report some issues with feeling warmer at times as well as feeling cold at times not just with the current illness Lab Results  Component Value Date   CHOL 213 (H) 09/16/2020   HDL 59 09/16/2020   LDLCALC 136 (H) 09/16/2020   TRIG 79 09/16/2020   CHOLHDL 3.6 09/16/2020   A/P: Lipids with mild poor control-suspect ASCVD risk will be low but we will update lipid panel when she comes back for labs.  Also check TSH with some temperature variations to rule out hypothyroidism contributing to elevated lipids  Recommended follow up: Return for as needed for new, worsening, persistent symptoms.  Otherwise at minimum needs at least yearly visit Future Appointments  Date Time Provider [place]  12/16/2021 10:15 AM Lyndee Hensen, PT [facility]  12/19/2021  1:00 PM Lyndee Hensen, PT [facility]  12/23/2021 12:30 PM Lyndee Hensen, PT [facility]  12/26/2021  1:00 PM Lyndee Hensen, PT [facility]  12/27/2021  1:20 PM Jaquita Folds, MD [facility]  12/30/2021 12:30 PM Lyndee Hensen, PT [facility]  01/02/2022  1:00 PM Lyndee Hensen, PT [facility]  01/06/2022 12:30 PM Lyndee Hensen, PT [facility]  01/30/2022  1:30 PM Dohmeier, Asencion Partridge, MD [facility] None    Lab/Order associations:   ICD-10-CM   1. Gastroesophageal reflux disease without esophagitis  K21.9     2. Hyperlipidemia, unspecified hyperlipidemia type  E78.5 CBC with Differential/Platelet    Comprehensive metabolic panel    Lipid panel    TSH    3. High risk medication use  Z79.899 Vitamin B12    4. Bipolar II disorder ([place]) Chronic F31.81     5. Iron deficiency  anemia, unspecified iron deficiency anemia type  D50.9 Ferritin      Meds ordered  this encounter  Medications   clotrimazole (MYCELEX) 10 MG troche    Sig: Take 1 tablet (10 mg total) by mouth 5 (five) times daily.    Dispense:  70 Troche    Refill:  0   Time Spent: 39 minutes of total time (2:05 PM- 2:44 PM) was spent on the date of the encounter performing the following actions: chart review prior to seeing the patient, obtaining history, performing a medically necessary exam, counseling on the treatment plan, placing orders, and documenting in our EHR.   Return precautions advised.  Garret Reddish, MD

## 2021-12-15 NOTE — Patient Instructions (Addendum)
Health Maintenance Due  Topic Date Due   INFLUENZA VACCINE   09/01/21 team please input 06/20/2021   Finish augmentin 10 days- if not fully resolved may try 10 day course of doxycycline   Looks like mild thrush maybe from prednisone- treat with clotrimazole  Schedule labs a week after resolution of symptoms- ordered today  Recommended follow up: Return for as needed for new, worsening, persistent symptoms.

## 2021-12-16 ENCOUNTER — Encounter: Payer: BLUE CROSS/BLUE SHIELD | Admitting: Physical Therapy

## 2021-12-19 ENCOUNTER — Encounter: Payer: BLUE CROSS/BLUE SHIELD | Admitting: Physical Therapy

## 2021-12-20 ENCOUNTER — Other Ambulatory Visit: Payer: Self-pay | Admitting: Family Medicine

## 2021-12-20 DIAGNOSIS — K219 Gastro-esophageal reflux disease without esophagitis: Secondary | ICD-10-CM

## 2021-12-23 ENCOUNTER — Encounter: Payer: BLUE CROSS/BLUE SHIELD | Admitting: Physical Therapy

## 2021-12-25 ENCOUNTER — Other Ambulatory Visit: Payer: Self-pay | Admitting: Neurology

## 2021-12-26 ENCOUNTER — Encounter: Payer: BLUE CROSS/BLUE SHIELD | Admitting: Physical Therapy

## 2021-12-26 NOTE — Progress Notes (Deleted)
[facility] New Patient Evaluation and Consultation  Referring Provider: Janyth Pupa, DO PCP: Marin Olp, MD Date of Service: 12/27/2021  SUBJECTIVE Chief Complaint: No chief complaint on file.  History of Present Illness: Debra Nelson is a 41 y.o. White or Caucasian female seen in consultation at the request of Dr. Nelda Marseille for evaluation of incontinence.    Review of records from Dr Nelda Marseille significant for: Has large leakage with cough or sneeze.   Urinary Symptoms: {urine leakage?:24754} Leaks *** time(s) per {days/wks/mos/yrs:310907}.  Pad use: {NUMBERS 1-10:18281} {pad option:24752} per day.   She {ACTION; IS/IS QPY:19509326} bothered by her UI symptoms.  Day time voids ***.  Nocturia: *** times per night to void. Voiding dysfunction: she {empties:24755} her bladder well.  {DOES NOT does:27190::"does not"} use a catheter to empty bladder.  When urinating, she feels {urine symptoms:24756} Drinks: *** per day  UTIs: {NUMBERS 1-10:18281} UTI's in the last year.   {ACTIONS;DENIES/REPORTS:21021675::"Denies"} history of {urologic concerns:24757}  Pelvic Organ Prolapse Symptoms:                  She {denies/ admits to:24761} a feeling of a bulge the vaginal area. It has been present for {NUMBER 1-10:22536} {days/wks/mos/yrs:310907}.  She {denies/ admits to:24761} seeing a bulge.  This bulge {ACTION; IS/IS ZTI:45809983} bothersome.  Bowel Symptom: Bowel movements: *** time(s) per {Time; day/week/month:13537} Stool consistency: {stool consistency:24758} Straining: {yes/no:19897}.  Splinting: {yes/no:19897}.  Incomplete evacuation: {yes/no:19897}.  She {denies/ admits to:24761} accidental bowel leakage / fecal incontinence  Occurs: *** time(s) per {Time; day/week/month:13537}  Consistency with leakage: {stool consistency:24758} Bowel regimen: {bowel regimen:24759} Last colonoscopy: Date ***, Results ***  Sexual Function Sexually active: {yes/no:19897}.   Sexual orientation: {Sexual [phone]} Pain with sex: {pain with sex:24762}  Pelvic Pain {denies/ admits to:24761} pelvic pain Location: *** Pain occurs: *** Prior pain treatment: *** Improved by: *** Worsened by: ***   Past Medical History:  Past Medical History:  Diagnosis Date   Allergy    Anemia    low normal- Iron once a week    Anxiety    Arthritis    Asthma    Bipolar disorder with depression (HCC)    and PTSD   Chicken pox    Chronic migraine w/o aura, not intractable, w stat migr    Depression    DJD (degenerative joint disease)    Severe of the right knee-Dr Creighton-ortho and Dr Gean Birchwood, PA-preferred pain managment   Eustachian tube disorder, bilateral    Hearing loss and vertigo   GERD (gastroesophageal reflux disease)    Narcolepsy    Neuromuscular disorder (HCC)    Fibromyalgia   PTSD (post-traumatic stress disorder)    PVC (premature ventricular contraction)    Rosacea    Seasonal allergic rhinitis      Past Surgical History:   Past Surgical History:  Procedure Laterality Date   knee surgery x5     08,11, 12, 13 x2- R knee   NASAL TURBINATE REDUCTION Bilateral 06/2020   right foot surgery     x 3, arthritis/bone spurus. plate placed.    WISDOM TOOTH EXTRACTION       Past OB/GYN History: G{NUMBERS 1-10:18281} P{NUMBERS 1-10:18281} Vaginal deliveries: ***,  Forceps/ Vacuum deliveries: ***, Cesarean section: *** Menopausal: {menopausal:24763} Contraception: ***. Last pap smear was ***.  Any history of abnormal pap smears: {yes/no:19897}.   Medications: She has a current medication list which includes the following prescription(s): acetaminophen, albuterol, alprazolam, amoxicillin-clavulanate, armodafinil, budesonide-formoterol, clotrimazole, cyclobenzaprine, cyclosporine, desloratadine, emgality,  epipen 2-pak, fexofenadine, flonase sensimist, lamotrigine, magnesium, melatonin, melatonin, metoprolol succinate, multiple  vitamins-minerals, nurtec, nutritional supplements, olopatadine, olopatadine hcl, ondansetron, OVER THE COUNTER MEDICATION, pantoprazole, probiotic product, promethazine, tramadol, and UNABLE TO FIND.   Allergies: Patient is allergic to other, klonopin [clonazepam], phenergan [promethazine hcl], promethazine hcl, and topamax [topiramate].   Social History:  Social History   Tobacco Use   Smoking status: Never   Smokeless tobacco: Never  Vaping Use   Vaping Use: Never used  Substance Use Topics   Alcohol use: No   Drug use: No    Relationship status: {relationship status:24764} She lives with ***.   She {ACTION; IS/IS AVW:09811914} employed ***. Regular exercise: {Yes/No:304960894} History of abuse: {Yes/No:304960894}  Family History:   Family History  Problem Relation Age of Onset   Mental illness Mother        does not speak to regularly so does not know full history   Migraines Mother    Other Father        medical issues, divorce related   Other Brother        ptsd- fully discharged medically disabled before age 22   Heart murmur Brother    Headache Brother        cluster   Cancer Paternal Grandfather        type unknown   Migraines Other    Colon cancer Neg Hx    Colon polyps Neg Hx    Esophageal cancer Neg Hx    Rectal cancer Neg Hx    Stomach cancer Neg Hx      Review of Systems: ROS   OBJECTIVE Physical Exam: There were no vitals filed for this visit.  Physical Exam   GU / Detailed Urogynecologic Evaluation:  Pelvic Exam: Normal external female genitalia; Bartholin's and Skene's glands normal in appearance; urethral meatus normal in appearance, no urethral masses or discharge.   CST: {gen negative/positive:315881}  Reflexes: bulbocavernosis {DESC; PRESENT/NOT PRESENT:21021351}, anocutaneous {DESC; PRESENT/NOT PRESENT:21021351} ***bilaterally.  Speculum exam reveals normal vaginal mucosa {With/Without:20273} atrophy. Cervix {exam; gyn  cervix:30847}. Uterus {exam; pelvic uterus:30849}. Adnexa {exam; adnexa:12223}.    s/p hysterectomy: Speculum exam reveals normal vaginal mucosa {With/Without:20273}  atrophy and normal vaginal cuff.  Adnexa {exam; adnexa:12223}.    With apex supported, anterior compartment defect was {reduced:24765}  Pelvic floor strength {Roman # I-V:19040}/V, puborectalis {Roman # I-V:19040}/V external anal sphincter {Roman # I-V:19040}/V  Pelvic floor musculature: Right levator {Tender/Non-tender:20250}, Right obturator {Tender/Non-tender:20250}, Left levator {Tender/Non-tender:20250}, Left obturator {Tender/Non-tender:20250}  POP-Q:   POP-Q                                               Aa                                               Ba                                                 C  Gh                                               Pb                                               tvl                                                Ap                                               Bp                                                 D     Rectal Exam:  Normal sphincter tone, {rectocele:24766} distal rectocele, enterocoele {DESC; PRESENT/NOT PRESENT:21021351}, no rectal masses, {sign of:24767} dyssynergia when asking the patient to bear down.  Post-Void Residual (PVR) by Bladder Scan: In order to evaluate bladder emptying, we discussed obtaining a postvoid residual and she agreed to this procedure.  Procedure: The ultrasound unit was placed on the patient's abdomen in the suprapubic region after the patient had voided. A PVR of *** ml was obtained by bladder scan.  Laboratory Results: @ENCLABS @   ***I visualized the urine specimen, noting the specimen to be {urine color:24768}  ASSESSMENT AND PLAN Ms. Hangartner is a 41 y.o. with: No diagnosis found.    Jaquita Folds, MD   Medical Decision Making:  - Reviewed/ ordered a  clinical laboratory test - Reviewed/ ordered a radiologic study - Reviewed/ ordered medicine test - Decision to obtain old records - Discussion of management of or test interpretation with an external physician / other healthcare professional  - Assessment requiring independent historian - Review and summation of prior records - Independent review of image, tracing or specimen

## 2021-12-27 ENCOUNTER — Ambulatory Visit: Payer: BLUE CROSS/BLUE SHIELD | Admitting: Obstetrics and Gynecology

## 2021-12-30 ENCOUNTER — Encounter: Payer: BLUE CROSS/BLUE SHIELD | Admitting: Physical Therapy

## 2022-01-02 ENCOUNTER — Encounter: Payer: BLUE CROSS/BLUE SHIELD | Admitting: Physical Therapy

## 2022-01-06 ENCOUNTER — Encounter: Payer: BLUE CROSS/BLUE SHIELD | Admitting: Physical Therapy

## 2022-01-11 ENCOUNTER — Other Ambulatory Visit: Payer: Self-pay | Admitting: Family Medicine

## 2022-01-11 ENCOUNTER — Other Ambulatory Visit: Payer: Self-pay

## 2022-01-11 DIAGNOSIS — K219 Gastro-esophageal reflux disease without esophagitis: Secondary | ICD-10-CM

## 2022-01-11 MED ORDER — BENZONATATE 200 MG PO CAPS: 200.0000 mg | ORAL_CAPSULE | Freq: Three times a day (TID) | ORAL | 1 refills | Status: DC | PRN

## 2022-01-30 ENCOUNTER — Ambulatory Visit: Payer: BLUE CROSS/BLUE SHIELD | Admitting: Neurology

## 2022-01-30 ENCOUNTER — Telehealth: Payer: Self-pay | Admitting: Adult Health

## 2022-01-30 NOTE — Telephone Encounter (Signed)
Pt cancelled appt due to not feeling, entire family is sick. ?

## 2022-02-04 ENCOUNTER — Other Ambulatory Visit: Payer: Self-pay | Admitting: Family Medicine

## 2022-02-04 DIAGNOSIS — K219 Gastro-esophageal reflux disease without esophagitis: Secondary | ICD-10-CM

## 2022-02-06 ENCOUNTER — Encounter: Payer: Self-pay | Admitting: Family Medicine

## 2022-02-07 ENCOUNTER — Other Ambulatory Visit: Payer: Self-pay | Admitting: Neurology

## 2022-02-13 ENCOUNTER — Encounter: Payer: Self-pay | Admitting: Cardiology

## 2022-02-13 NOTE — Telephone Encounter (Signed)
I called the pt and made her an appt for 02/15/22 with an APP. Pt will call or My Chart of anything changes or worsens prior to her appt.  ?

## 2022-02-14 ENCOUNTER — Other Ambulatory Visit: Payer: Self-pay

## 2022-02-14 ENCOUNTER — Encounter: Payer: Self-pay | Admitting: Family Medicine

## 2022-02-14 ENCOUNTER — Encounter: Payer: Self-pay | Admitting: Allergy

## 2022-02-14 ENCOUNTER — Ambulatory Visit: Payer: BLUE CROSS/BLUE SHIELD | Admitting: Allergy

## 2022-02-14 VITALS — BP 118/78 | HR 81 | Temp 98.3°F | Resp 18 | Ht 62.0 in | Wt 159.8 lb

## 2022-02-14 DIAGNOSIS — Z91038 Other insect allergy status: Secondary | ICD-10-CM | POA: Diagnosis not present

## 2022-02-14 DIAGNOSIS — J310 Chronic rhinitis: Secondary | ICD-10-CM | POA: Diagnosis not present

## 2022-02-14 DIAGNOSIS — B999 Unspecified infectious disease: Secondary | ICD-10-CM

## 2022-02-14 DIAGNOSIS — J4550 Severe persistent asthma, uncomplicated: Secondary | ICD-10-CM | POA: Diagnosis not present

## 2022-02-14 DIAGNOSIS — K219 Gastro-esophageal reflux disease without esophagitis: Secondary | ICD-10-CM

## 2022-02-14 DIAGNOSIS — L299 Pruritus, unspecified: Secondary | ICD-10-CM | POA: Insufficient documentation

## 2022-02-14 MED ORDER — CETIRIZINE HCL 10 MG PO TABS: 10.0000 mg | ORAL_TABLET | Freq: Every day | ORAL | 5 refills | Status: DC

## 2022-02-14 MED ORDER — BUDESONIDE 0.5 MG/2ML IN SUSP: 0.5000 mg | Freq: Two times a day (BID) | RESPIRATORY_TRACT | 2 refills | Status: DC

## 2022-02-14 MED ORDER — ALBUTEROL SULFATE (2.5 MG/3ML) 0.083% IN NEBU: 2.5000 mg | INHALATION_SOLUTION | RESPIRATORY_TRACT | 1 refills | Status: DC | PRN

## 2022-02-14 MED ORDER — TRELEGY ELLIPTA 200-62.5-25 MCG/ACT IN AEPB: 1.0000 | INHALATION_SPRAY | Freq: Every day | RESPIRATORY_TRACT | 0 refills | Status: DC

## 2022-02-14 MED ORDER — TRIAMCINOLONE ACETONIDE 55 MCG/ACT NA AERO: 1.0000 | INHALATION_SPRAY | Freq: Two times a day (BID) | NASAL | 5 refills | Status: DC

## 2022-02-14 NOTE — Assessment & Plan Note (Signed)
Noticed whole body itching since stopped antihistamines. ?? Restart zyrtec '10mg'$  daily. ?? See below for proper skin care.  ?? Get bloodwork. ?

## 2022-02-14 NOTE — Assessment & Plan Note (Signed)
Itching and chest tightness in the past.  Prior allergist's records show that hymenoptera panel was ordered but results not available for review. Patient not aware of this. ?? Continue to avoid ?? Get bloodwork. ?? For mild symptoms you can take over the counter antihistamines such as Benadryl and monitor symptoms closely. If symptoms worsen or if you have severe symptoms including breathing issues, throat closure, significant swelling, whole body hives, severe diarrhea and vomiting, lightheadedness then inject epinephrine and seek immediate medical care afterwards. ?

## 2022-02-14 NOTE — Progress Notes (Signed)
? ?New Patient Note ? ?RE: Debra Nelson MRN: [id number] DOB: 1981/09/26 ?Date of Office Visit: 02/14/2022 ? ?Consult requested by: Marin Olp, MD ?Primary care provider: Marin Olp, MD ? ?Chief Complaint: Allergic Rhinitis  (Allergy flare after christmas  and it cause asthmatic bronchitis - steroids helped with coughing but later had to take an antibiotic for sinus issues. Later had a second round of bronchitis and sinus issues and was given another steroid. Had severe itch watery eyes which caused her pain. ), Asthma, Other (Has been offer her allergy medication for this visit and has had severe itching and has scars from scratching so much. ), and Sinus Problem (Constant sinus pressure and headache - has been having issues with vertigo anything can trigger it. ) ? ?History of Present Illness: ?I had the pleasure of seeing Debra Nelson for initial evaluation at the [facility] on 02/14/2022. She is a 41 y.o. female, who is self-referred here to establish care for asthma and allergic rhinitis. ? ?Patient follows with [facility] for asthma and allergic rhinitis.  ? ?Asthma:  ?She reports symptoms of chest tightness, shortness of breath, coughing, wheezing, nocturnal awakenings for 20+ years. Current medications include Symbicort 14mg 2 puffs BID x 6+ months with unknown benefit. She reports not using aerochamber with inhalers. She tried the following inhalers: Flovent, Arnuity, Breo, Advair. Main triggers are allergies, infections, exercise.  ? ?In the last month, frequency of symptoms: sometimes daily. Frequency of nocturnal symptoms: depends. Frequency of SABA use: 6 times the last month. Interference with physical activity: yes. In the last 12 months, emergency room visits/urgent care visits/doctor office visits or hospitalizations due to respiratory issues: 4-5. In the last 12 months, oral steroids courses: 2. Lifetime history of hospitalization for respiratory issues:  yes when she had pneumonia. Prior intubations: no. History of pneumonia: once. She was evaluated by allergist  in the past. Smoking exposure: no. Up to date with flu vaccine: yes. Up to date with COVID-19 vaccine: yes. Prior Covid-19 infection: no. ?History of reflux: sometimes and is on PPI.  ? ?Rhinitis  ?She reports symptoms of nasal congestion, sneezing, sinus pressure, itchy/watery eyes. Lately she had vertigo with allergy flares. Symptoms have been going on for many years. The symptoms are present all year around with worsening in spring and fall. Anosmia: diminished sense of smell. Headache: yes. She has used azelastine nasal spray, allegra, Flonase, Claritin, clarinex, olopatadine nasal spray with minimal improvement in symptoms.  ?Tried Singulair in the past and it was stopped.  ? ?Sinus infections: 2. Previous work up includes: skin testing in 2021 - intradermal testing borderline positive to trees, mold, dust mites. Negative to select foods. No prior AIT.  ?Previous ENT evaluation: patient had sinus surgery in the past. She has appointment to see a new ENT in June/July.  ?Previous sinus imaging: no. ?History of nasal polyps: no. ?Last eye exam: within the past year, patient has dry eyes.  ? ?History of anxiety, bipolar and PVCs. ? ?Reviewed 56 pages of records from prior allergist office. ? ?10/04/2021 CXR: ?"Normal exam." ? ?Assessment and Plan: ?MHarleanis a 41y.o. female with: ?Not well controlled severe persistent asthma ?Diagnosed with asthma over 20 years ago.  Currently on Symbicort 162 puffs twice a day for 6+ months with unknown benefit.  Tried Flovent, Arnuity, Breo and Advair in the past.  Main triggers are allergies, infections and exercise.  2 courses of prednisone the past year.  No  prior COVID-19 infection.  Takes PPI for reflux. Still having frequent symptoms.  ?Today's spirometry was normal with 5% improvement in FEV1 postbronchodilator treatment.  Clinically feeling slightly  improved. ?Daily controller medication(s): Start Trelegy 232mg 1 puff once a day and rinse mouth after each use. Samples given and demonstrated proper use.  ?This replaces Symbicort. ?If it works well let me know and will send in a prescription.  ?During upper respiratory infections/flares:  ?Start budesonide 0.'5mg'$  nebulizer twice a day for 1-2 weeks until your breathing symptoms return to baseline.  ?Pretreat with albuterol 2 puffs or albuterol nebulizer.  ?If you need to use your albuterol nebulizer machine back to back within 15-30 minutes with no relief then please go to the ER/urgent care for further evaluation.  ?May use albuterol rescue inhaler 2 puffs or nebulizer every 4 to 6 hours as needed for shortness of breath, chest tightness, coughing, and wheezing. May use albuterol rescue inhaler 2 puffs 5 to 15 minutes prior to strenuous physical activities. Monitor frequency of use.  ?Get spirometry at next visit. ? ?Chronic rhinitis ?Perennial rhinoconjunctivitis symptoms for many years.  Tried azelastine, Allegra, Flonase, Claritin, Clarinex, olopatadine with minimal benefit.  Singulair was stopped due to concerns regarding her mood changes.  2021 intradermal testing was borderline positive to trees, mold and dust mites.  No prior AIT. Sinus surgery in the past.  ?Today's skin prick testing showed: Negative to indoor/outdoor allergen and common foods.  ?Get bloodwork instead of intradermal testing as she never had bloodwork to check her environmental allergies in the past.  ?Use Nasacort (triamcinolone) nasal spray 1 spray per nostril twice a day as needed for nasal congestion.  ?Demonstrated proper use.  ?Nasal saline spray (i.e., Simply Saline) or nasal saline lavage (i.e., NeilMed) is recommended as needed and prior to medicated nasal sprays. ?Follow up with ENT.  ? ?Recurrent infections ?Usually has 2 sinus infections in the past. Records show that immune lab work was ordered by her previous allergist -  lab results not available for review.  ?Keep track of infections and antibiotics use. ?Get bloodwork to look at immune system. ? ?Pruritus ?Noticed whole body itching since stopped antihistamines. ?Restart zyrtec '10mg'$  daily. ?See below for proper skin care.  ?Get bloodwork. ? ?Hymenoptera allergy ?Itching and chest tightness in the past.  Prior allergist's records show that hymenoptera panel was ordered but results not available for review. Patient not aware of this. ?Continue to avoid ?Get bloodwork. ?For mild symptoms you can take over the counter antihistamines such as Benadryl and monitor symptoms closely. If symptoms worsen or if you have severe symptoms including breathing issues, throat closure, significant swelling, whole body hives, severe diarrhea and vomiting, lightheadedness then inject epinephrine and seek immediate medical care afterwards. ? ?Gastroesophageal reflux disease ?Doing better that her anxiety is better controlled. ?Continue PPI as prescribed. ? ?Return in about 2 months (around 04/16/2022). ? ?Meds ordered this encounter  ?Medications  ? cetirizine (ZYRTEC ALLERGY) 10 MG tablet  ?  Sig: Take 1 tablet (10 mg total) by mouth daily.  ?  Dispense:  30 tablet  ?  Refill:  5  ? albuterol (PROVENTIL) (2.5 MG/3ML) 0.083% nebulizer solution  ?  Sig: Take 3 mLs (2.5 mg total) by nebulization every 4 (four) hours as needed for wheezing or shortness of breath (coughing fits).  ?  Dispense:  75 mL  ?  Refill:  1  ? budesonide (PULMICORT) 0.5 MG/2ML nebulizer solution  ?  Sig: Take 2  mLs (0.5 mg total) by nebulization in the morning and at bedtime. Use during asthma flares for 1-2 weeks.  ?  Dispense:  120 mL  ?  Refill:  2  ? triamcinolone (NASACORT) 55 MCG/ACT AERO nasal inhaler  ?  Sig: Place 1 spray into the nose 2 (two) times daily.  ?  Dispense:  1 each  ?  Refill:  5  ? Fluticasone-Umeclidin-Vilant (TRELEGY ELLIPTA) 200-62.5-25 MCG/ACT AEPB  ?  Sig: Inhale 1 puff into the lungs daily.  ?  Dispense:   1 each  ?  Refill:  0  ? ?Lab Orders    ?     Allergen Hymenoptera Panel    ?     Tryptase    ?     Allergens w/Total IgE Area 2    ?     Alpha-Gal Panel    ?     ANA w/Reflex    ?     CBC with Differential/Platel

## 2022-02-14 NOTE — Patient Instructions (Addendum)
Today's skin testing showed: ?Negative to indoor/outdoor allergen and common foods.  ? ?Results given. ? ?Asthma: ?Daily controller medication(s): Start Trelegy 263mg 1 puff once a day and rinse mouth after each use. Samples given. ?This replaces Symbicort. ?If it works well let me know and will send in a prescription.  ?During upper respiratory infections/flares:  ?Start budesonide 0.'5mg'$  nebulizer twice a day for 1-2 weeks until your breathing symptoms return to baseline.  ?Pretreat with albuterol 2 puffs or albuterol nebulizer.  ?If you need to use your albuterol nebulizer machine back to back within 15-30 minutes with no relief then please go to the ER/urgent care for further evaluation.  ?May use albuterol rescue inhaler 2 puffs or nebulizer every 4 to 6 hours as needed for shortness of breath, chest tightness, coughing, and wheezing. May use albuterol rescue inhaler 2 puffs 5 to 15 minutes prior to strenuous physical activities. Monitor frequency of use.  ?Asthma control goals:  ?Full participation in all desired activities (may need albuterol before activity) ?Albuterol use two times or less a week on average (not counting use with activity) ?Cough interfering with sleep two times or less a month ?Oral steroids no more than once a year ?No hospitalizations  ? ?Rhinitis  ?Get bloodwork. ?Use Nasacort (triamcinolone) nasal spray 1 spray per nostril twice a day as needed for nasal congestion.  ?Nasal saline spray (i.e., Simply Saline) or nasal saline lavage (i.e., NeilMed) is recommended as needed and prior to medicated nasal sprays. ?Follow up with ENT.  ? ?Infections ?Keep track of infections and antibiotics use. ?Get bloodwork ? ?Itching ?Restart zyrtec '10mg'$  daily. ?See below for proper skin care.  ? ?Bee sting ?Continue to avoid ?Get bloodwork. ?For mild symptoms you can take over the counter antihistamines such as Benadryl and monitor symptoms closely. If symptoms worsen or if you have severe symptoms  including breathing issues, throat closure, significant swelling, whole body hives, severe diarrhea and vomiting, lightheadedness then inject epinephrine and seek immediate medical care afterwards. ? ?Follow up in 2 months or sooner if needed.   ? ?Skin care recommendations ? ?Bath time: ?Always use lukewarm water. AVOID very hot or cold water. ?Keep bathing time to 5-10 minutes. ?Do NOT use bubble bath. ?Use a mild soap and use just enough to wash the dirty areas. ?Do NOT scrub skin vigorously.  ?After bathing, pat dry your skin with a towel. Do NOT rub or scrub the skin. ? ?Moisturizers and prescriptions:  ?ALWAYS apply moisturizers immediately after bathing (within 3 minutes). This helps to lock-in moisture. ?Use the moisturizer several times a day over the whole body. ?Good summer moisturizers include: Aveeno, CeraVe, Cetaphil. ?Good winter moisturizers include: Aquaphor, Vaseline, Cerave, Cetaphil, Eucerin, Vanicream. ?When using moisturizers along with medications, the moisturizer should be applied about one hour after applying the medication to prevent diluting effect of the medication or moisturize around where you applied the medications. When not using medications, the moisturizer can be continued twice daily as maintenance. ? ?Laundry and clothing: ?Avoid laundry products with added color or perfumes. ?Use unscented hypo-allergenic laundry products such as Tide free, Cheer free & gentle, and All free and clear.  ?If the skin still seems dry or sensitive, you can try double-rinsing the clothes. ?Avoid tight or scratchy clothing such as wool. ?Do not use fabric softeners or dyer sheets. ? ?

## 2022-02-14 NOTE — Assessment & Plan Note (Signed)
Diagnosed with asthma over 20 years ago.  Currently on Symbicort 162 puffs twice a day for 6+ months with unknown benefit.  Tried Flovent, Arnuity, Breo and Advair in the past.  Main triggers are allergies, infections and exercise.  2 courses of prednisone the past year.  No prior COVID-19 infection.  Takes PPI for reflux. Still having frequent symptoms.  ?? Today's spirometry was normal with 5% improvement in FEV1 postbronchodilator treatment.  Clinically feeling slightly improved. ?? Daily controller medication(s): Start Trelegy 273mg 1 puff once a day and rinse mouth after each use. Samples given and demonstrated proper use.  ?o This replaces Symbicort. ?o If it works well let me know and will send in a prescription.  ?? During upper respiratory infections/flares:  ?? Start budesonide 0.'5mg'$  nebulizer twice a day for 1-2 weeks until your breathing symptoms return to baseline.  ?? Pretreat with albuterol 2 puffs or albuterol nebulizer.  ?? If you need to use your albuterol nebulizer machine back to back within 15-30 minutes with no relief then please go to the ER/urgent care for further evaluation.  ?? May use albuterol rescue inhaler 2 puffs or nebulizer every 4 to 6 hours as needed for shortness of breath, chest tightness, coughing, and wheezing. May use albuterol rescue inhaler 2 puffs 5 to 15 minutes prior to strenuous physical activities. Monitor frequency of use.  ?? Get spirometry at next visit. ?

## 2022-02-14 NOTE — Assessment & Plan Note (Signed)
Doing better that her anxiety is better controlled. ?? Continue PPI as prescribed. ?

## 2022-02-14 NOTE — Assessment & Plan Note (Signed)
Usually has 2 sinus infections in the past. Records show that immune lab work was ordered by her previous allergist - lab results not available for review.  ?? Keep track of infections and antibiotics use. ?? Get bloodwork to look at immune system. ?

## 2022-02-14 NOTE — Assessment & Plan Note (Signed)
Perennial rhinoconjunctivitis symptoms for many years.  Tried azelastine, Allegra, Flonase, Claritin, Clarinex, olopatadine with minimal benefit.  Singulair was stopped due to concerns regarding her mood changes.  2021 intradermal testing was borderline positive to trees, mold and dust mites.  No prior AIT. Sinus surgery in the past.  ?? Today's skin prick testing showed: Negative to indoor/outdoor allergen and common foods.  ?? Get bloodwork instead of intradermal testing as she never had bloodwork to check her environmental allergies in the past.  ?? Use Nasacort (triamcinolone) nasal spray 1 spray per nostril twice a day as needed for nasal congestion.  ?? Demonstrated proper use.  ?? Nasal saline spray (i.e., Simply Saline) or nasal saline lavage (i.e., NeilMed) is recommended as needed and prior to medicated nasal sprays. ?? Follow up with ENT.  ?

## 2022-02-15 ENCOUNTER — Ambulatory Visit (INDEPENDENT_AMBULATORY_CARE_PROVIDER_SITE_OTHER): Payer: BLUE CROSS/BLUE SHIELD

## 2022-02-15 ENCOUNTER — Ambulatory Visit: Payer: BLUE CROSS/BLUE SHIELD | Admitting: Nurse Practitioner

## 2022-02-15 ENCOUNTER — Encounter: Payer: Self-pay | Admitting: Nurse Practitioner

## 2022-02-15 ENCOUNTER — Other Ambulatory Visit: Payer: Self-pay

## 2022-02-15 ENCOUNTER — Telehealth: Payer: Self-pay | Admitting: Neurology

## 2022-02-15 VITALS — BP 126/76 | HR 74 | Resp 20 | Ht 61.0 in | Wt 161.4 lb

## 2022-02-15 DIAGNOSIS — R002 Palpitations: Secondary | ICD-10-CM

## 2022-02-15 DIAGNOSIS — R001 Bradycardia, unspecified: Secondary | ICD-10-CM

## 2022-02-15 DIAGNOSIS — I493 Ventricular premature depolarization: Secondary | ICD-10-CM

## 2022-02-15 DIAGNOSIS — R0789 Other chest pain: Secondary | ICD-10-CM

## 2022-02-15 DIAGNOSIS — D509 Iron deficiency anemia, unspecified: Secondary | ICD-10-CM

## 2022-02-15 DIAGNOSIS — E785 Hyperlipidemia, unspecified: Secondary | ICD-10-CM

## 2022-02-15 DIAGNOSIS — B999 Unspecified infectious disease: Secondary | ICD-10-CM

## 2022-02-15 NOTE — Telephone Encounter (Signed)
PA completed on [facility] ?KEY: [id number] ?Will await response ?

## 2022-02-15 NOTE — Progress Notes (Signed)
? ? ?Office Visit  ?  ?Patient Name: Debra Nelson ?Date of Encounter: 02/15/2022 ? ?Primary Care Provider:  Marin Olp, MD ?Primary Cardiologist:  Donato Heinz, MD ? ?Chief Complaint  ?  ?41 year old female with a history of palpitations/PVCs, hyperlipidemia, bipolar disorder, PTSD, anxiety, narcolepsy, migraines, and GERD who presents for follow-up related to palpitations, chest discomfort and low heart rate. ? ?Past Medical History  ?  ?Past Medical History:  ?Diagnosis Date  ? Allergy   ? Anemia   ? low normal- Iron once a week   ? Anxiety   ? Arthritis   ? Asthma   ? Bipolar disorder with depression ([place])   ? and PTSD  ? Chicken pox   ? Chronic migraine w/o aura, not intractable, w stat migr   ? Depression   ? DJD (degenerative joint disease)   ? Severe of the right knee-Dr Creighton-ortho and Dr Gean Birchwood, PA-preferred pain managment  ? Eustachian tube disorder, bilateral   ? Hearing loss and vertigo  ? GERD (gastroesophageal reflux disease)   ? Narcolepsy   ? Neuromuscular disorder ([place])   ? Fibromyalgia  ? PTSD (post-traumatic stress disorder)   ? PVC (premature ventricular contraction)   ? Rosacea   ? Seasonal allergic rhinitis   ? ?Past Surgical History:  ?Procedure Laterality Date  ? knee surgery x5    ? 08,11, 12, 13 x2- R knee  ? NASAL TURBINATE REDUCTION Bilateral 06/2020  ? right foot surgery    ? x 3, arthritis/bone spurus. plate placed.   ? WISDOM TOOTH EXTRACTION    ? ? ?Allergies ? ?Allergies  ?Allergen Reactions  ? Other Other (See Comments)  ?  Other reaction(s): muscle spasms  ? Klonopin [Clonazepam] Nausea Only  ? Promethazine Hcl Other (See Comments)  ?  IV causes muscle tics, may take oral, or give benadryl prior to IV  ? Topamax [Topiramate] Nausea And Vomiting  ?  confusion  ? ? ?History of Present Illness  ?  ?41 year old female with the above past medical history including palpitations/PVCs, hyperlipidemia, Bipolar disorder, PTSD, anxiety, narcolepsy, migraines,  and GERD.  ? ?ETT 2020 showed low exercise capacity, no ischemia.  Event monitor showed occasional PVCs. Echocardiogram in October 2020 showed EF 55%, no significant valvular disease.  She had a syncopal episode in 2022, ED work-up was unremarkable, this was thought to be in the setting of complex migraine.  Zio patch in July 2021 showed no significant arrhythmias, low PVC burden (less than 1%).  She was started on metoprolol for symptomatic PVCs and states she had previously been on propanolol and diltiazem). She was last seen in the office on 06/13/2021 and was stable overall from a cardiac standpoint.  She did report ongoing palpitations, symptomatic PVCs, overall, managed with metoprolol.  She did report some shortness of breath with activity this was thought to be in the setting of weight gain.   ? ?She contacted our office on 02/13/2022 and reported fleeting jolting/stabbing pain in her chest, as well as notifications on her smart watch of HR in the 40s-50s. She presents today for follow-up. Since she contacted our office she has been stable from a cardiac standpoint. She has had fleeting episodes of stabbing pain in her chest which lasts for only seconds at a time. She denies any exertional symptoms or other symptoms concerning for angina. She has noted low heart rates on her smart watch, however, she denies any associated symptoms. She has self-weaned her metoprolol  and is currently taking 25 mg daily. She is interested in stopping her metoprolol altogether. She has had issues with allergies/sinus problems recently and is on new medications to help with this.  Other than her concern over low heart rate and her recent episodes of chest discomfort, she denies any additional concerns or complaints today. ? ?Home Medications  ?  ?Current Outpatient Medications  ?Medication Sig Dispense Refill  ? ACETAMINOPHEN PO Take 650 mg by mouth every 6 (six) hours as needed.    ? albuterol (PROVENTIL) (2.5 MG/3ML) 0.083%  nebulizer solution Take 3 mLs (2.5 mg total) by nebulization every 4 (four) hours as needed for wheezing or shortness of breath (coughing fits). 75 mL 1  ? albuterol (VENTOLIN HFA) 108 (90 Base) MCG/ACT inhaler SMARTSIG:2 Puff(s) By Mouth Every 4-6 Hours PRN    ? ALPRAZolam (XANAX XR) 1 MG 24 hr tablet Take 1 mg by mouth daily. 0.5 regular    ? Armodafinil 250 MG tablet TAKE 1 TABLET BY MOUTH EVERY MORNING 30 tablet 5  ? budesonide (PULMICORT) 0.5 MG/2ML nebulizer solution Take 0.5 mg by nebulization as needed.    ? cetirizine (ZYRTEC ALLERGY) 10 MG tablet Take 1 tablet (10 mg total) by mouth daily. 30 tablet 5  ? cyclobenzaprine (FLEXERIL) 10 MG tablet Take 10 mg by mouth as needed.    ? EMGALITY 120 MG/ML SOAJ INJECT 120 MG INTO THE SKIN EVERY 30 (THIRTY) DAYS. 1 mL 2  ? EPIPEN 2-PAK 0.3 MG/0.3ML SOAJ injection as needed.    ? Fluticasone-Umeclidin-Vilant (TRELEGY ELLIPTA) 200-62.5-25 MCG/ACT AEPB Inhale 1 puff into the lungs daily. 1 each 0  ? lamoTRIgine (LAMICTAL) 200 MG tablet Take 400 mg by mouth at bedtime.    ? MAGNESIUM PO Take by mouth.    ? Melatonin 5 MG CAPS Take by mouth.    ? Multiple Vitamins-Minerals (HAIR SKIN AND NAILS FORMULA PO) Take 1 tablet by mouth daily.     ? NURTEC 75 MG TBDP TAKE 1 TABLET BY MOUTH DAILY AS NEEDED. AS CLOSE TO ONSET OF MIGRAINE AS POSSIBLE. **MAX 1 TAB/DAY** 8 tablet 8  ? Nutritional Supplements (ESTROVEN PO) Take by mouth.    ? ondansetron (ZOFRAN-ODT) 4 MG disintegrating tablet Take 2 tablets (8 mg total) by mouth every 8 (eight) hours as needed for nausea or vomiting. 8 tablet 2  ? OVER THE COUNTER MEDICATION Take 1-2 tablets by mouth 2 (two) times daily. Magnesium L-theonate- 1 tab in am and 2 tablets at bedtime    ? pantoprazole (PROTONIX) 40 MG tablet TAKE 1 TABLET BY MOUTH EVERY DAY 30 tablet 0  ? Probiotic Product (PROBIOTIC DAILY PO) Take 1 tablet by mouth daily.    ? promethazine (PHENERGAN) 12.5 MG tablet TAKE 1 TABLET BY MOUTH EVERY 6 HOURS AS NEEDED FOR  NAUSEA OR VOMITING. 30 tablet 2  ? traMADol (ULTRAM) 50 MG tablet Take 50 mg by mouth 4 (four) times daily as needed.    ? triamcinolone (NASACORT) 55 MCG/ACT AERO nasal inhaler Place 1 spray into the nose 2 (two) times daily. 1 each 5  ? UNABLE TO FIND Take 50 mg by mouth in the morning, at noon, and at bedtime. Med Name: Ketamine    ? cycloSPORINE, PF, (CEQUA) 0.09 % SOLN 1 drop into affected eye    ? metoprolol succinate (TOPROL-XL) 50 MG 24 hr tablet TAKE 1/2 TABLET BY MOUTH TWICE A DAY 90 tablet 3  ? Rhubarb (ESTROVEN COMPLETE) 4 MG TABS See admin instructions.    ? ?  No current facility-administered medications for this visit.  ?  ? ?Review of Systems  ?  ?She denies chest pain, palpitations, dyspnea, pnd, orthopnea, n, v, dizziness, syncope, edema, weight gain, or early satiety. All other systems reviewed and are otherwise negative except as noted above.  ? ?Physical Exam  ?  ?VS:  BP 126/76 (BP Location: Left Arm, Patient Position: Sitting, Cuff Size: Normal)   Pulse 74   Resp 20   Ht '5\' 1"'$  (1.549 m)   Wt 161 lb 6.4 oz (73.2 kg)   SpO2 96%   BMI 30.50 kg/m?  ?GEN: Well nourished, well developed, in no acute distress. ?HEENT: normal. ?Neck: Supple, no JVD, carotid bruits, or masses. ?Cardiac: RRR, no murmurs, rubs, or gallops. No clubbing, cyanosis, edema.  Radials/DP/PT 2+ and equal bilaterally.  ?Respiratory:  Respirations regular and unlabored, clear to auscultation bilaterally. ?GI: Soft, nontender, nondistended, BS + x 4. ?MS: no deformity or atrophy. ?Skin: warm and dry, no rash. ?Neuro:  Strength and sensation are intact. ?Psych: Normal affect. ? ?Accessory Clinical Findings  ?  ?ECG personally reviewed by me today - NSR, 74 bpm - no acute changes. ? ?Lab Results  ?Component Value Date  ? WBC 9.1 01/14/2021  ? HGB 14.2 01/14/2021  ? HCT 41.8 01/14/2021  ? MCV 92.3 01/14/2021  ? PLT 382 01/14/2021  ? ?Lab Results  ?Component Value Date  ? CREATININE 0.77 01/14/2021  ? BUN 8 01/14/2021  ? NA 140  01/14/2021  ? K 4.4 01/14/2021  ? CL 102 01/14/2021  ? CO2 27 01/14/2021  ? ?Lab Results  ?Component Value Date  ? ALT 11 01/14/2021  ? AST 11 01/14/2021  ? ALKPHOS 96 01/04/2021  ? BILITOT 0.5 01/14/2021  ? ?Lab Results

## 2022-02-15 NOTE — Progress Notes (Unsigned)
Enrolled patient for a 7 day Zio XT monitor to be mailed to patients home   Dr Schumann to read 

## 2022-02-15 NOTE — Patient Instructions (Addendum)
Medication Instructions:  ?STOP Metoprolol as directed. ? ?*If you need a refill on your cardiac medications before your next appointment, please call your pharmacy* ? ? ?Lab Work: ?NONE ordered at this time of appointment  ? ? ?Testing/Procedures: ? ?ZIO XT- Long Term Monitor Instructions ? ?Your physician has requested you wear a ZIO patch monitor for 7 days.  ?This is a single patch monitor. [employer] supplies one patch monitor per enrollment. Additional ?stickers are not available. Please do not apply patch if you will be having a Nuclear Stress Test,  ?Echocardiogram, Cardiac CT, MRI, or Chest Xray during the period you would be wearing the  ?monitor. The patch cannot be worn during these tests. You cannot remove and re-apply the  ?ZIO XT patch monitor.  ?Your ZIO patch monitor will be mailed 3 day USPS to your address on file. It may take 3-5 days  ?to receive your monitor after you have been enrolled.  ?Once you have received your monitor, please review the enclosed instructions. Your monitor  ?has already been registered assigning a specific monitor serial # to you. ? ?Billing and Patient Assistance Program Information ? ?We have supplied [employer] with any of your insurance information on file for billing purposes. ?[employer] offers a sliding scale Patient Assistance Program for patients that do not have  ?insurance, or whose insurance does not completely cover the cost of the ZIO monitor.  ?You must apply for the Patient Assistance Program to qualify for this discounted rate.  ?To apply, please call [employer] at [phone], select option 4, select option 2, ask to apply for  ?Patient Assistance Program. Theodore Demark will ask your household income, and how many people  ?are in your household. They will quote your out-of-pocket cost based on that information.  ?[employer] will also be able to set up a 72-month interest-free payment plan if needed. ? ?Applying the monitor ?  ?Shave hair from upper left chest.  ?Hold  abrader disc by orange tab. Rub abrader in 40 strokes over the upper left chest as  ?indicated in your monitor instructions.  ?Clean area with 4 enclosed alcohol pads. Let dry.  ?Apply patch as indicated in monitor instructions. Patch will be placed under collarbone on left  ?side of chest with arrow pointing upward.  ?Rub patch adhesive wings for 2 minutes. Remove white label marked "1". Remove the white  ?label marked "2". Rub patch adhesive wings for 2 additional minutes.  ?While looking in a mirror, press and release button in center of patch. A small green light will  ?flash 3-4 times. This will be your only indicator that the monitor has been turned on.  ?Do not shower for the first 24 hours. You may shower after the first 24 hours.  ?Press the button if you feel a symptom. You will hear a small click. Record Date, Time and  ?Symptom in the Patient Logbook.  ?When you are ready to remove the patch, follow instructions on the last 2 pages of Patient  ?Logbook. Stick patch monitor onto the last page of Patient Logbook.  ?Place Patient Logbook in the blue and white box. Use locking tab on box and tape box closed  ?securely. The blue and white box has prepaid postage on it. Please place it in the mailbox as  ?soon as possible. Your physician should have your test results approximately 7 days after the  ?monitor has been mailed back to [facility]  ?Call [facility]at [phone] you have questions  regarding  ?your ZIO XT patch monitor. Call them immediately if you see an orange light blinking on your  ?monitor.  ?If your monitor falls off in less than 4 days, contact our Monitor department at [phone].  ?If your monitor becomes loose or falls off after 4 days call [employer] at [phone] for  ?suggestions on securing your monitor   ? ? ?Follow-Up: ?At [facility], you and your health needs are our priority.  As part of our continuing mission to provide you with exceptional heart  care, we have created designated Provider Care Teams.  These Care Teams include your primary Cardiologist (physician) and Advanced Practice Providers (APPs -  Physician Assistants and Nurse Practitioners) who all work together to provide you with the care you need, when you need it. ? ?We recommend signing up for the patient portal called "MyChart".  Sign up information is provided on this After Visit Summary.  MyChart is used to connect with patients for Virtual Visits (Telemedicine).  Patients are able to view lab/test results, encounter notes, upcoming appointments, etc.  Non-urgent messages can be sent to your provider as well.   ?To learn more about what you can do with MyChart, go to [email or web address].   ? ?Your next appointment:   ?6 week(s) ? ?The format for your next appointment:   ?In Person ? ?Provider:   ?Diona Browner, NP      ? ? ? ? ? ?

## 2022-02-15 NOTE — Telephone Encounter (Signed)
[place] APPROV[place] ?[id number];VPLWUZ:[id number];Review Type:Prior Auth;Coverage Start Date:01/16/2022;Coverage End Date:02/15/2023 ?

## 2022-02-16 ENCOUNTER — Other Ambulatory Visit: Payer: BLUE CROSS/BLUE SHIELD

## 2022-02-16 DIAGNOSIS — Z79899 Other long term (current) drug therapy: Secondary | ICD-10-CM

## 2022-02-16 DIAGNOSIS — E785 Hyperlipidemia, unspecified: Secondary | ICD-10-CM

## 2022-02-16 DIAGNOSIS — D509 Iron deficiency anemia, unspecified: Secondary | ICD-10-CM

## 2022-02-16 DIAGNOSIS — B999 Unspecified infectious disease: Secondary | ICD-10-CM

## 2022-02-17 ENCOUNTER — Other Ambulatory Visit: Payer: Self-pay | Admitting: Neurology

## 2022-02-17 DIAGNOSIS — I493 Ventricular premature depolarization: Secondary | ICD-10-CM

## 2022-02-22 LAB — ALLERGEN HYMENOPTERA PANEL
CLASS: 0
CLASS: 0
CLASS: 0
CLASS: 0
CLASS: 0
Honey Bee IgE: <0.1 kU/L
Paper Wasp IgE: <0.1 kU/L
White Hornet IgE: <0.1 kU/L
Yellow Hornet IgE: <0.1 kU/L
Yellow Jacket IgE: <0.1 kU/L

## 2022-02-22 LAB — STREP PNEUMONIAE 23 SEROTYPES IGG
Serotype 17 (17F): <0.3
Serotype 2 (2): <0.3
Serotype 20 (20): <0.3
Serotype 22 (22F): <0.3
Serotype 34 (10A): <0.3
Serotype 43 (11A): <0.3
Serotype 54 (15B): 0.7
Serotype 57 (19A): 2.8
Serotype 70 (33F): <0.3
Strep pneumo Type 12: 0.4
Strep pneumo Type 19: 1.9
Strep pneumo Type 4: <0.3
Strep pneumo Type 9: <0.3
Strep pneumoniae Type 1 Abs: <0.3
Strep pneumoniae Type 14 Abs: 2.3
Strep pneumoniae Type 18C Abs: <0.3
Strep pneumoniae Type 23F Abs: <0.3
Strep pneumoniae Type 3 Abs: <0.3
Strep pneumoniae Type 5 Abs: <0.3
Strep pneumoniae Type 6B Abs: <0.3
Strep pneumoniae Type 7F Abs: <0.3
Strep pneumoniae Type 8 Abs: 0.6
Strep pneumoniae Type 9N Abs: <0.3

## 2022-02-22 LAB — ALPHA-GAL PANEL
Allergen, Mutton, f88: <0.1 kU/L
Allergen, Pork, f26: <0.1 kU/L
Beef: <0.1 kU/L
CLASS: 0
CLASS: 0
Class: 0
GALACTOSE-ALPHA-1,3-GALACTOSE IGE*: <0.1 kU/L (ref <0.10)

## 2022-02-22 LAB — TRYPTASE: Tryptase: 3.7 mcg/L (ref <11.0)

## 2022-02-22 LAB — IGG, IGA, IGM
IgG (Immunoglobin G), Serum: 569 mg/dL — ABNORMAL LOW (ref 600–1640)
IgM, Serum: 66 mg/dL (ref 50–300)
Immunoglobulin A: 184 mg/dL (ref 47–310)

## 2022-02-22 LAB — INTERPRETATION:

## 2022-02-22 LAB — COMPLEMENT, TOTAL: Compl, Total (CH50): >60 U/mL — ABNORMAL HIGH (ref 31–60)

## 2022-02-22 LAB — DIPHTHERIA / TETANUS ANTIBODY PANEL
Diphtheria Ab: <0.1 IU/mL — ABNORMAL LOW
Tetanus Toxin Antibody, Total: 0.56 IU/mL

## 2022-02-24 ENCOUNTER — Encounter: Payer: Self-pay | Admitting: Family Medicine

## 2022-02-24 LAB — ALLERGENS W/TOTAL IGE AREA 2
Alternaria Alternata IgE: <0.1 kU/L
Aspergillus Fumigatus IgE: <0.1 kU/L
Bermuda Grass IgE: <0.1 kU/L
Cat Dander IgE: <0.1 kU/L
Cedar, Mountain IgE: <0.1 kU/L
Cladosporium Herbarum IgE: <0.1 kU/L
Cockroach, German IgE: <0.1 kU/L
Common Silver Birch IgE: <0.1 kU/L
Cottonwood IgE: <0.1 kU/L
D Farinae IgE: <0.1 kU/L
D Pteronyssinus IgE: <0.1 kU/L
Dog Dander IgE: <0.1 kU/L
Elm, American IgE: <0.1 kU/L
IgE (Immunoglobulin E), Serum: <2 IU/mL — ABNORMAL LOW (ref 6–495)
Johnson Grass IgE: <0.1 kU/L
Maple/Box Elder IgE: <0.1 kU/L
Mouse Urine IgE: <0.1 kU/L
Oak, White IgE: <0.1 kU/L
Pecan, Hickory IgE: <0.1 kU/L
Penicillium Chrysogen IgE: <0.1 kU/L
Pigweed, Rough IgE: <0.1 kU/L
Ragweed, Short IgE: <0.1 kU/L
Sheep Sorrel IgE Qn: <0.1 kU/L
Timothy Grass IgE: <0.1 kU/L
White Mulberry IgE: <0.1 kU/L

## 2022-02-24 LAB — THYROID CASCADE PROFILE: TSH: 1.06 u[IU]/mL (ref 0.450–4.500)

## 2022-02-24 LAB — ANA W/REFLEX: ANA Titer 1: NEGATIVE

## 2022-02-24 LAB — CHRONIC URTICARIA: cu index: 2.8 (ref <10)

## 2022-02-27 ENCOUNTER — Other Ambulatory Visit: Payer: Self-pay

## 2022-02-27 DIAGNOSIS — B999 Unspecified infectious disease: Secondary | ICD-10-CM

## 2022-02-28 ENCOUNTER — Encounter: Payer: Self-pay | Admitting: Allergy

## 2022-03-01 ENCOUNTER — Other Ambulatory Visit (INDEPENDENT_AMBULATORY_CARE_PROVIDER_SITE_OTHER): Payer: BLUE CROSS/BLUE SHIELD

## 2022-03-01 ENCOUNTER — Other Ambulatory Visit: Payer: Self-pay | Admitting: *Deleted

## 2022-03-01 ENCOUNTER — Encounter: Payer: Self-pay | Admitting: Family Medicine

## 2022-03-01 DIAGNOSIS — D509 Iron deficiency anemia, unspecified: Secondary | ICD-10-CM | POA: Diagnosis not present

## 2022-03-01 DIAGNOSIS — B999 Unspecified infectious disease: Secondary | ICD-10-CM | POA: Diagnosis not present

## 2022-03-01 LAB — COMPREHENSIVE METABOLIC PANEL
ALT: 18 U/L (ref 0–35)
AST: 19 U/L (ref 0–37)
Albumin: 4.8 g/dL (ref 3.5–5.2)
Alkaline Phosphatase: 125 U/L — ABNORMAL HIGH (ref 39–117)
BUN: 5 mg/dL — ABNORMAL LOW (ref 6–23)
CO2: 28 mEq/L (ref 19–32)
Calcium: 9.5 mg/dL (ref 8.4–10.5)
Chloride: 101 mEq/L (ref 96–112)
Creatinine, Ser: 0.79 mg/dL (ref 0.40–1.20)
GFR: 91.86 mL/min (ref >60.00)
Glucose, Bld: 107 mg/dL — ABNORMAL HIGH (ref 70–99)
Potassium: 3.7 mEq/L (ref 3.5–5.1)
Sodium: 138 mEq/L (ref 135–145)
Total Bilirubin: 0.5 mg/dL (ref 0.2–1.2)
Total Protein: 6.9 g/dL (ref 6.0–8.3)

## 2022-03-01 LAB — CBC WITH DIFFERENTIAL/PLATELET
Basophils Absolute: 0.1 10*3/uL (ref 0.0–0.1)
Basophils Relative: 0.8 % (ref 0.0–3.0)
Eosinophils Absolute: 0.1 10*3/uL (ref 0.0–0.7)
Eosinophils Relative: 0.8 % (ref 0.0–5.0)
HCT: 41.9 % (ref 36.0–46.0)
Hemoglobin: 13.9 g/dL (ref 12.0–15.0)
Lymphocytes Relative: 31.9 % (ref 12.0–46.0)
Lymphs Abs: 2.3 10*3/uL (ref 0.7–4.0)
MCHC: 33.2 g/dL (ref 30.0–36.0)
MCV: 94.7 fl (ref 78.0–100.0)
Monocytes Absolute: 0.5 10*3/uL (ref 0.1–1.0)
Monocytes Relative: 7.3 % (ref 3.0–12.0)
Neutro Abs: 4.2 10*3/uL (ref 1.4–7.7)
Neutrophils Relative %: 59.2 % (ref 43.0–77.0)
Platelets: 386 10*3/uL (ref 150.0–400.0)
RBC: 4.42 Mil/uL (ref 3.87–5.11)
RDW: 12.8 % (ref 11.5–15.5)
WBC: 7.1 10*3/uL (ref 4.0–10.5)

## 2022-03-01 LAB — FERRITIN: Ferritin: 37.1 ng/mL (ref 10.0–291.0)

## 2022-03-01 LAB — LIPID PANEL
Cholesterol: 229 mg/dL — ABNORMAL HIGH (ref 0–200)
HDL: 47.7 mg/dL (ref >39.00)
LDL Cholesterol: 152 mg/dL — ABNORMAL HIGH (ref 0–99)
NonHDL: 181.65
Total CHOL/HDL Ratio: 5
Triglycerides: 146 mg/dL (ref 0.0–149.0)
VLDL: 29.2 mg/dL (ref 0.0–40.0)

## 2022-03-01 LAB — VITAMIN B12: Vitamin B-12: 396 pg/mL (ref 211–911)

## 2022-03-01 LAB — TSH: TSH: 1.91 u[IU]/mL (ref 0.35–5.50)

## 2022-03-01 MED ORDER — BREZTRI AEROSPHERE 160-9-4.8 MCG/ACT IN AERO: 2.0000 | INHALATION_SPRAY | Freq: Two times a day (BID) | RESPIRATORY_TRACT | 5 refills | Status: DC

## 2022-03-01 MED ORDER — NYSTATIN 100000 UNIT/ML MT SUSP: 5.0000 mL | Freq: Four times a day (QID) | OROMUCOSAL | 0 refills | Status: AC

## 2022-03-02 ENCOUNTER — Other Ambulatory Visit: Payer: Self-pay | Admitting: Family Medicine

## 2022-03-02 DIAGNOSIS — K219 Gastro-esophageal reflux disease without esophagitis: Secondary | ICD-10-CM

## 2022-03-02 LAB — ALLERGEN HYMENOPTERA PANEL
CLASS: 0
CLASS: 0
CLASS: 0
CLASS: 0
CLASS: 0
Honey Bee IgE: <0.1 kU/L
Paper Wasp IgE: <0.1 kU/L
White Hornet IgE: <0.1 kU/L
Yellow Hornet IgE: <0.1 kU/L
Yellow Jacket IgE: <0.1 kU/L

## 2022-03-02 LAB — INTERPRETATION:

## 2022-03-02 NOTE — Telephone Encounter (Signed)
FYI

## 2022-03-08 ENCOUNTER — Encounter: Payer: Self-pay | Admitting: Allergy

## 2022-03-14 ENCOUNTER — Ambulatory Visit: Payer: BLUE CROSS/BLUE SHIELD | Admitting: Obstetrics and Gynecology

## 2022-03-15 ENCOUNTER — Telehealth: Payer: Self-pay

## 2022-03-15 NOTE — Telephone Encounter (Signed)
Spoke with pt. Pt was notified of heart monitor results. Pt will notify or office if she wants to restart Metoprolol. Pt will follow up as planned.  ?

## 2022-03-17 MED ORDER — NYSTATIN 100000 UNIT/ML MT SUSP: 5.0000 mL | Freq: Four times a day (QID) | OROMUCOSAL | 0 refills | Status: DC

## 2022-03-17 NOTE — Addendum Note (Signed)
Addended by: Garnet Sierras on: 03/17/2022 07:37 AM ? ? Modules accepted: Orders ? ?

## 2022-03-27 ENCOUNTER — Other Ambulatory Visit: Payer: Self-pay | Admitting: Neurology

## 2022-03-27 ENCOUNTER — Ambulatory Visit: Payer: BLUE CROSS/BLUE SHIELD | Admitting: Nurse Practitioner

## 2022-03-29 ENCOUNTER — Ambulatory Visit
Admission: RE | Admit: 2022-03-29 | Discharge: 2022-03-29 | Disposition: A | Discharge status: Home / Self Care | Payer: BLUE CROSS/BLUE SHIELD | Source: Ambulatory Visit | Attending: Obstetrics & Gynecology | Admitting: Obstetrics & Gynecology

## 2022-03-29 DIAGNOSIS — Z1231 Encounter for screening mammogram for malignant neoplasm of breast: Secondary | ICD-10-CM

## 2022-03-29 NOTE — Progress Notes (Signed)
?Cardiology Office Note:   ? ?Date:  03/30/2022  ? ?ID:  Debra Nelson, DOB 19-Jun-1981, MRN [id number] ? ?PCP:  Marin Olp, MD ?[place] Cardiologist: Donato Heinz, MD  ? ?Reason for visit: 6-week follow-up ? ?History of Present Illness:   ? ?Debra Nelson is a 41 y.o. female with a hx of palpitations/PVCs, hyperlipidemia, Bipolar disorder, PTSD, anxiety, narcolepsy, migraines, and GERD.  ? ?ETT 2020 showed low exercise capacity, no ischemia.  Event monitor showed occasional PVCs. Echocardiogram in October 2020 showed EF 55%, no significant valvular disease.  She had a syncopal episode in 2022, ED work-up was unremarkable, this was thought to be in the setting of complex migraine.  Zio patch in July 2021 showed no significant arrhythmias, low PVC burden (less than 1%).  She was started on metoprolol for symptomatic PVCs and states she had previously been on propanolol and diltiazem).  ? ?She was last seen by Diona Browner NP on February 15 2022 for stabbing chest pain and heart rate in the 40s to 50s.  Patient self weaned her metoprolol to 25 mg daily.  At her appointment metoprolol was discontinued.  7-day Zio patch was ordered to follow-up arrhythmias bradycardia --> monitor showed normal sinus rhythm with average heart rate 92.  Rare PACs and PVCs (less than 1%).  11 patient triggered events, corresponding to sinus rhythm ? PACs, PVCs and ventricular trigeminy. ? ?Today, patient feels well.  She states her palpitations have greatly improved with decrease caffeine use.  She was previously drinking 8 sodas a day and now she is down to 2.  She is also made dietary adjustments and lost 30 pounds since November 2022.  She states even with coming off the beta-blocker and decreased Xanax dose, she rarely has palpitations now.  She states she had severe anxiety a year ago and was overmedicated.  Now she sounds like she has a good plan with volunteer work, getting into photography and  considering a masters in mental health counseling.  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 ? ?  ?Past Medical History:  ?Diagnosis Date  ? Allergy   ? Anemia   ? low normal- Iron once a week   ? Anxiety   ? Arthritis   ? Asthma   ? Bipolar disorder with depression ([place])   ? and PTSD  ? Chicken pox   ? Chronic migraine w/o aura, not intractable, w stat migr   ? Depression   ? DJD (degenerative joint disease)   ? Severe of the right knee-Dr Creighton-ortho and Dr Gean Birchwood, PA-preferred pain managment  ? Eustachian tube disorder, bilateral   ? Hearing loss and vertigo  ? GERD (gastroesophageal reflux disease)   ? Narcolepsy   ? Neuromuscular disorder ([place])   ? Fibromyalgia  ? PTSD (post-traumatic stress disorder)   ? PVC (premature ventricular contraction)   ? Rosacea   ? Seasonal allergic rhinitis   ? ? ?Past Surgical History:  ?Procedure Laterality Date  ? knee surgery x5    ? 08,11, 12, 13 x2- R knee  ? NASAL TURBINATE REDUCTION Bilateral 06/2020  ? right foot surgery    ? x 3, arthritis/bone spurus. plate placed.   ? WISDOM TOOTH EXTRACTION    ? ? ?Current Medications: ?Current Meds  ?Medication Sig  ? ACETAMINOPHEN PO Take 650 mg by mouth every 6 (six) hours as needed.  ? albuterol (PROVENTIL) (2.5 MG/3ML) 0.083% nebulizer solution Take 3 mLs (2.5  mg total) by nebulization every 4 (four) hours as needed for wheezing or shortness of breath (coughing fits).  ? albuterol (VENTOLIN HFA) 108 (90 Base) MCG/ACT inhaler SMARTSIG:2 Puff(s) By Mouth Every 4-6 Hours PRN  ? ALPRAZolam (XANAX XR) 1 MG 24 hr tablet Take 1 mg by mouth daily. 0.5 regular  ? Armodafinil 250 MG tablet TAKE 1 TABLET BY MOUTH EVERY MORNING  ? BREZTRI AEROSPHERE 160-9-4.8 MCG/ACT AERO Inhale 2 puffs into the lungs in the morning and at bedtime.  ? budesonide (PULMICORT) 0.5 MG/2ML nebulizer solution Take 0.5 mg by nebulization as needed.  ? cetirizine (ZYRTEC ALLERGY) 10 MG tablet Take 1 tablet (10 mg total) by mouth daily.  ?  Cholecalciferol (VITAMIN D3 PO) Take 400 Int'l Units/1.35m by mouth daily.  ? cycloSPORINE, PF, (CEQUA) 0.09 % SOLN 1 drop into affected eye  ? Galcanezumab-gnlm (EMGALITY) 120 MG/ML SOAJ Inject 120 mg into the skin every 30 (thirty) days. Please call and make overdue appt for further refills 1st attempt  ? lamoTRIgine (LAMICTAL) 200 MG tablet Take 400 mg by mouth at bedtime.  ? Melatonin 5 MG CAPS Take by mouth.  ? Multiple Vitamins-Minerals (HAIR SKIN AND NAILS FORMULA PO) Take 1 tablet by mouth daily.   ? NURTEC 75 MG TBDP TAKE 1 TABLET BY MOUTH DAILY AS NEEDED. AS CLOSE TO ONSET OF MIGRAINE AS POSSIBLE. **MAX 1 TAB/DAY**  ? ondansetron (ZOFRAN-ODT) 4 MG disintegrating tablet Take 2 tablets (8 mg total) by mouth every 8 (eight) hours as needed for nausea or vomiting.  ? OVER THE COUNTER MEDICATION Take 1-2 tablets by mouth 2 (two) times daily. Magnesium L-theonate- 1 tab in am and 2 tablets at bedtime  ? pantoprazole (PROTONIX) 40 MG tablet TAKE 1 TABLET BY MOUTH EVERY DAY (Patient taking differently: 20 mg daily.)  ? Probiotic Product (PROBIOTIC DAILY PO) Take 1 tablet by mouth daily.  ? promethazine (PHENERGAN) 12.5 MG tablet TAKE 1 TABLET BY MOUTH EVERY 6 HOURS AS NEEDED FOR NAUSEA OR VOMITING.  ? Rhubarb (ESTROVEN COMPLETE) 4 MG TABS See admin instructions.  ? triamcinolone (NASACORT) 55 MCG/ACT AERO nasal inhaler Place 1 spray into the nose 2 (two) times daily.  ? UNABLE TO FIND Take 50 mg by mouth in the morning, at noon, and at bedtime. Med Name: Ketamine  ? vitamin B-12 (CYANOCOBALAMIN) 1000 MCG tablet Take 1,000 mcg by mouth daily.  ?  ? ?Allergies:   Other, Klonopin [clonazepam], Promethazine hcl, and Topamax [topiramate]  ? ?Social History  ? ?Socioeconomic History  ? Marital status: Married  ?  Spouse name: [name]  ? Number of children: 3  ? Years of education: College  ? Highest education level: Not on file  ?Occupational History  ?  Employer: OTHER  ?Tobacco Use  ? Smoking status: Never  ? Smokeless  tobacco: Never  ?Vaping Use  ? Vaping Use: Never used  ?Substance and Sexual Activity  ? Alcohol use: No  ? Drug use: No  ? Sexual activity: Yes  ?  Partners: Male  ?  Birth control/protection: None, Surgical  ?  Comment: vasectomy  ?Other Topics Concern  ? Not on file  ?Social History Narrative  ? Patient is married ([name]) and lives at home with her family  ? 4 adults - 2 kids 50% of the time (1 son, 1 daughter). 2 older children (one age 4953from rape, then 145year old- does not get to see- related to bipolar) Moved in with in laws to help them  ?   ?  Patient has 4 children (3 of her own and 1 step son).   ? Patient has a college education. Psychology and biology. Was paramedic.   ?   ? Stay at home mom  ?   ? Caffeine: most days none, maybe an 8 oz coke but rarely   ? Patient is right-handed.  ? ?Social Determinants of Health  ? ?Financial Resource Strain: Medium Risk  ? Difficulty of Paying Living Expenses: Somewhat hard  ?Food Insecurity: Food Insecurity Present  ? Worried About Charity fundraiser in the Last Year: Sometimes true  ? Ran Out of Food in the Last Year: Never true  ?Transportation Needs: No Transportation Needs  ? Lack of Transportation (Medical): No  ? Lack of Transportation (Non-Medical): No  ?Physical Activity: Inactive  ? Days of Exercise per Week: 0 days  ? Minutes of Exercise per Session: 0 min  ?Stress: Stress Concern Present  ? Feeling of Stress : To some extent  ?Social Connections: Socially Integrated  ? Frequency of Communication with Friends and Family: More than three times a week  ? Frequency of Social Gatherings with Friends and Family: Not on file  ? Attends Religious Services: 1 to 4 times per year  ? Active Member of Clubs or Organizations: Yes  ? Attends Archivist Meetings: More than 4 times per year  ? Marital Status: Married  ?  ? ?Family History: ?The patient's family history includes Cancer in her paternal grandfather; Headache in her brother; Heart murmur in her  brother; Mental illness in her mother; Migraines in her mother and another family member; Other in her brother and father. There is no history of Colon cancer, Colon polyps, Esophageal cancer, Rectal cancer, or

## 2022-03-30 ENCOUNTER — Ambulatory Visit: Payer: BLUE CROSS/BLUE SHIELD | Admitting: Physician Assistant

## 2022-03-30 ENCOUNTER — Encounter: Payer: Self-pay | Admitting: Physician Assistant

## 2022-03-30 VITALS — BP 114/82 | HR 87 | Resp 20 | Ht 61.0 in | Wt 152.6 lb

## 2022-03-30 DIAGNOSIS — R001 Bradycardia, unspecified: Secondary | ICD-10-CM

## 2022-03-30 DIAGNOSIS — R002 Palpitations: Secondary | ICD-10-CM

## 2022-03-30 DIAGNOSIS — R0789 Other chest pain: Secondary | ICD-10-CM

## 2022-03-30 DIAGNOSIS — I493 Ventricular premature depolarization: Secondary | ICD-10-CM

## 2022-03-30 NOTE — Patient Instructions (Signed)
Medication Instructions:  ?Your physician recommends that you continue on your current medications as directed. Please refer to the Current Medication list given to you today.  ? ?*If you need a refill on your cardiac medications before your next appointment, please call your pharmacy* ? ? ?Lab Work: ?NONE ordered at this time of appointment  ? ?If you have labs (blood work) drawn today and your tests are completely normal, you will receive your results only by: ?MyChart Message (if you have MyChart) OR ?A paper copy in the mail ?If you have any lab test that is abnormal or we need to change your treatment, we will call you to review the results. ? ? ?Testing/Procedures: ?NONE ordered at this time of appointment  ? ? ? ?Follow-Up: ?At [facility], you and your health needs are our priority.  As part of our continuing mission to provide you with exceptional heart care, we have created designated Provider Care Teams.  These Care Teams include your primary Cardiologist (physician) and Advanced Practice Providers (APPs -  Physician Assistants and Nurse Practitioners) who all work together to provide you with the care you need, when you need it. ? ?We recommend signing up for the patient portal called "MyChart".  Sign up information is provided on this After Visit Summary.  MyChart is used to connect with patients for Virtual Visits (Telemedicine).  Patients are able to view lab/test results, encounter notes, upcoming appointments, etc.  Non-urgent messages can be sent to your provider as well.   ?To learn more about what you can do with MyChart, go to [email or web address].   ? ?Your next appointment:   ? Follow up As needed  ? ?The format for your next appointment:   ?In Person ? ?Provider:   ?Donato Heinz, MD   ? ? ?Other Instructions ? ? ?Important Information About Sugar ? ? ? ? ? ? ?

## 2022-04-03 ENCOUNTER — Ambulatory Visit: Payer: BLUE CROSS/BLUE SHIELD | Admitting: Neurology

## 2022-04-03 ENCOUNTER — Encounter: Payer: Self-pay | Admitting: Neurology

## 2022-04-03 VITALS — BP 119/80 | HR 97 | Ht 61.0 in | Wt 154.0 lb

## 2022-04-03 DIAGNOSIS — G4719 Other hypersomnia: Secondary | ICD-10-CM | POA: Diagnosis not present

## 2022-04-03 DIAGNOSIS — F3161 Bipolar disorder, current episode mixed, mild: Secondary | ICD-10-CM

## 2022-04-03 DIAGNOSIS — N943 Premenstrual tension syndrome: Secondary | ICD-10-CM

## 2022-04-03 DIAGNOSIS — G4710 Hypersomnia, unspecified: Secondary | ICD-10-CM | POA: Insufficient documentation

## 2022-04-03 MED ORDER — ARMODAFINIL 250 MG PO TABS: 250.0000 mg | ORAL_TABLET | Freq: Every morning | ORAL | 1 refills | Status: DC

## 2022-04-03 NOTE — Progress Notes (Addendum)
?SLEEP MEDICINE CLINIC ? ? ? [facility] Provider:  Larey Seat, MD ?  ?Referring Provider/ Primary Care Physician:  Debra Olp, MD  ? ?Cc Debra May, MD ? ?Chief Complaint  ?Patient presents with  ? Follow-up  ?  Rm 9, alone. Here to f/u for migraine HA, narcolepsy, and RLS. Overall migraines have been better. Comes and goes. Narcolepsy and RLS have been stable.   ? ?04-03-2022: RV Here to f/u for migraine HA, narcolepsy, and RLS. Overall migraines have been better. Comes and goes. Narcolepsy and RLS have been stable. She wakes up more tired, but feels better than on Xyrem. Rare cataplectic events.  ? No nocturnal twitching or kicking but often can't fall asleep easily.  ?That part is new: by thanksgiving 2022 she had a weight of 180 pounds, and now 150 .  ?Without Xyrem but on Nuvigil , her Epworth Sleepiness score was 10/ 24 , needs to take nuvigil before 9 AM in order to sleep at night- this is a cyclic finding. ?Debra Debra Nelson  has weaned off heart medicine, depression medication and narcolepsy, Protonix reduced by 50% , still on xanax but 1-2 a week now (!). ? ? ? ? ? ?03-03-2021: RV after report of feeling "overdrugged" on Xyrem, has weaned off  Xyrem and feels great !  ?Last seen by Debra Dick, NP in March 28th, 2022.  ?She had been doing well on XYREM for years before- since 2014.  ?Now has developed a lower tolerance for the side effects or has been no longer helped. No more parasomnia, but more RLS? .  ?She is getting better on sleep on Zyprexa,  ? Now seeing; Debra Nestle, PA.  Bipolar depression-  ?Debra Debra Nelson, [facility]- [facility] and [facility].  ?Debra Debra Nelson remains her PCP.  ?Had a home sleep test,this was negative for OSA: quoted below-  ?She had quite a bit of PLM. Has RLS- we will start treating this now.  Dr Yong Debra Nelson has already looked into electrolyte , TSH, Hep C, CBC and differential, all normal. ?I initially wanted to use a very low dose of a  dopamine agonist, but she is also already on neurontin 600 mg tid, prescribed by her new psychiatry provider. Lets up the night time dose.   ? ? ? ? ?RV : 07-10-2019,I have the pleasure of seeing Debra Debra Nelson in the presence of her husband here today , she is usually getting a good nights rest. Epworth is 8/ 24 and FSS endorsed at 34/63 point.  ?Migraine have been infrequent.  ?Blood work and refills today.  ?Depression : symptoms denied.  ? ?RV 01-02-2019, meeting for refills.  ?Xyrem user at  3.5 gram for first dose and 4.5 second dose. ?She is today suffering form a GI infection and last night was affected, sleep reportedly poor. She lost weight. She quit Sodas- and the sugar intake reduction helped with fatigue. She hydrates more with water, and she goes to the bathroom more often.  ? ? XYREM and NUVIGIL controlled her hypersomnia and lithium, Buspar and Lamictal controlled her anxiety/ Bipolar disorder.  ?  ?XYREM has alleviated her cataplexy - No need for tofranil.  Increased XYREM 2 month ago to 3.5 gram first dose and 4.5 g second dose nightly.  ?Parasomnia/ sleep walking activity on XYREM has ceased after the dose was reduced from 4 to 3 gram twice at night but Xyrem was less effective.  She reports now not having parasomnia activity in  spite of higher dosing.  ?I have the pleasure of seeing Debra Debra Nelson in the presence of her husband here today on 02 July 2018. She is a longtime established narcolepsy patient in our practice who is taking Xyrem.  She has overall achieved very good control of hypersomnia but we had to find the right dose for her liquid nightly trigger sleepwalking episodes nor allow her to resume her nightmarish dreams.  For many months she did well at 3 g twice at night, however the dreams have now returned in the early morning hours following the second dose by probably 2 hours also.  We could increase the second Xyrem dose to 3.5 g, and Debra Nelson be divided the 2 doses by a longer interval.  Of  2-1/2 hours.  ? This Debra Nelson cover the second half of the night better, but it puts her at risk of daytime sleepiness if the Xyrem is still in her system.  Usually she works at 7.30 AM . This allows the intake time to be 1.30 AM, the first dose around 22.00 hours.   ? ? ?Debra Debra Nelson is a 41 y.o. female , seen here as a revisit from Debra Debra Nelson for treatment of hypersomnia, HLA positive , diagnosed with Narcolepsy. ?Debra Debra Nelson has been titrated to Xyrem which has given her final several years of good control of hypersomnia.  Originally I had titrated her to the maximum dose of 4.5 g twice at night but she began to sleep walk and had other parasomnia activity, the dose was reduced to 3 g twice at night under which she did well for the last 18 months.  She now reports that she is not sleeping nearly as deep, she has breakthrough dream activity and her husband confirms that it is easier to wake her than it used to be.  Since she has room to further increase the dose we can go from here to at least 3.5 g twice at night and see if further adjustments are necessary.  I would like this process to go slow as not to reintroduce sleepwalking. ?The fatigue severity score was endorsed at 42 points and the Epworth sleepiness score at 8 points.   ?This is still a great effect in comparison to the untreated Epworth sleepiness score. ? ? ?Today is 07/02/2017, And as the pleasure of seeing Ms. Debra Debra Nelson in the presence of her young daughter. The patient endorses only 3 points on her Epworth Sleepiness Scale today, 22% on her fatigue score and is doing very well in terms of control of sleepiness, cataplexy and narcolepsy. She has lost weight. She finally got off Soda. She is not employed right now.  ?Her husband was retired from the police due to DDD.  ? ? ?08-31-2016  Debra Debra Nelson is a 41 y.o. female , seen here as a revisit from Debra Debra Nelson for treatment of hypersomnia, HLA positive , diagnosed with Narcolepsy and cataplexy  . ?The patient developed sleepwalking episodes on Xyrem, but she could not find control of her narcolepsy and cataplexy without the medication. For this reason she has restarted apnea monitoring her closely. Her fatigue severity scale today is 47, Epworth sleepiness score is 6 points on Xyrem with armodafinil . [employer] has now been again covered by her insurance not longer on Tofranil-.Mood overall is good, less variability, not euphoric.Not suicidal - no intent , no plan.   ?Mrs. Ruddy reports morning headaches frequently now, my concern is that Xyrem Debra Nelson induce hypoventilation that  she could be a CO2 retainer. 3 gram of Xyrem has controlled sleepiness and not allowed sleepwaling, but still has vivid dreams.  ? 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 ? She has started to sleep walk almost nightly and severely. The discussion as to discontinue the Xyrem and rely modafinil only- but that would not affect her cataplexy. Currently she has not taken Xyrem for 12 days but is not sleep walking, but her cataplexy is poorly controlled. I would like very much for her to go back on Xyrem but it has to be in a safe environment for she can be watched not vice versa. I will give her Tofranil today to control the cataplexy as long as she is not able to go back on Xyrem and I would refill modafinil.-Nuvigil. This helped a lot in day time. ( Due to parasomnia activity Xyrem is currently on hold. Her husband needs to be back on his feet before we can stop the medication again I also will give her Tofranil to help control cataplexy be continued Nuvigil in daytime. They can change the dosing of Xyrem to the first dose being given at 4.5 g at night and the second dose to be 2 or even 3 g less. Sometimes this will help to prevent parasomnias in the morning hours. I would also like  for her to have a motion detector at the door of the bedroom so that she Debra Nelson wake up or others are alerted to her activity. I would like to recommend her to lock all doors and especially keeps a car keys in a

## 2022-04-03 NOTE — Patient Instructions (Signed)
Modafinil Tablets ?What is this medication? ?MODAFINIL (moe DAF i nil) treats sleep disorders, such as narcolepsy, obstructive sleep apnea, and shift work disorder. It works by promoting wakefulness. It belongs to a group of medications called stimulants. ?This medicine may be used for other purposes; ask your health care provider or pharmacist if you have questions. ?COMMON BRAND NAME(S): Provigil ?What should I tell my care team before I take this medication? ?They need to know if you have any of these conditions: ?Kidney disease ?Liver disease ?Mental health conditions ?An unusual or allergic reaction to modafinil, other medications, foods, dyes, or preservatives ?Pregnant or trying to get pregnant ?Breast-feeding ?How should I use this medication? ?Take this medication by mouth with water. Take it as directed on the prescription label at the same time every day. You can take it with or without food. If it upsets your stomach, take it with food. Keep taking it unless your care team tells you to stop. ?A special MedGuide will be given to you by the pharmacist with each prescription and refill. Be sure to read this information carefully each time. ?Talk to your care team about the use of this medication in children. Special care may be needed. ?Overdosage: If you think you have taken too much of this medicine contact a poison control center or emergency room at once. ?NOTE: This medicine is only for you. Do not share this medicine with others. ?What if I miss a dose? ?If you miss a dose, take it as soon as you can. If it is almost time for your next dose, take only that dose. Do not take double or extra doses. ?What may interact with this medication? ?Do not take this medication with any of the following: ?Amphetamine or dextroamphetamine ?Dexmethylphenidate or methylphenidate ?MAOIs, such as Marplan, Nardil, and Parnate ?Pemoline ?Procarbazine ?This medication may also interact with the following: ?Antifungal  medications, such as itraconazole or ketoconazole ?Barbiturates, such as phenobarbital ?Carbamazepine ?Cyclosporine ?Diazepam ?Estrogen or progestin hormones ?Medications for mental health conditions ?Phenytoin ?Propranolol ?Triazolam ?Warfarin ?This list may not describe all possible interactions. Give your health care provider a list of all the medicines, herbs, non-prescription drugs, or dietary supplements you use. Also tell them if you smoke, drink alcohol, or use illegal drugs. Some items may interact with your medicine. ?What should I watch for while using this medication? ?Visit your care team for regular checks on your progress. It may be some time before you see the benefit from this medication. ?This medication may affect your coordination, reaction time, or judgment. It may also hide signs that you are tired. This medication will not eliminate your abnormal tendency to fall asleep and is not a replacement for sleep. Do not drive or operate machinery until you know how this medication affects you. Sit up or stand slowly to reduce the risk of dizzy or fainting spells. Drinking alcohol with this medication can increase the risk of these side effects. ?This medication may cause serious skin reactions. They can happen weeks to months after starting the medication. Contact your care team right away if you notice fevers or flu-like symptoms with a rash. The rash may be red or purple and then turn into blisters or peeling of the skin. You may also notice a red rash with swelling of the face, lips, or lymph nodes in your neck or under your arms. ?Estrogen and progestin hormones may not work as well while you are taking this medication. If you are using these hormones  for contraception, talk to your care team about using a second type of contraception. A barrier contraceptive, such as a condom or diaphragm, is recommended. ?It is unknown if the effects of this medication will be increased by the use of caffeine.  Caffeine is found in many foods, beverages, and medications. Ask your care team if you should limit or change your intake of caffeine-containing products while on this medication. ?What side effects may I notice from receiving this medication? ?Side effects that you should report to your care team as soon as possible: ?Allergic reactions or angioedema--skin rash, itching or hives, swelling of the face, eyes, lips, tongue, arms, or legs, trouble swallowing or breathing ?Increase in blood pressure ?Mood and behavior changes--anxiety, nervousness, confusion, hallucinations, irritability, hostility, thoughts of suicide or self-harm, worsening mood, feelings of depression ?Rash, fever, and swollen lymph nodes ?Redness, blistering, peeling, or loosening of the skin, including inside the mouth ?Side effects that usually do not require medical attention (report to your care team if they continue or are bothersome): ?Anxiety, nervousness ?Dizziness ?Headache ?Nausea ?Trouble sleeping ?This list may not describe all possible side effects. Call your doctor for medical advice about side effects. You may report side effects to FDA at 1-800-FDA-1088. ?Where should I keep my medication? ?Keep out of the reach of children and pets. This medication can be abused. Keep it in a safe place to protect it from theft. Do not share it with anyone. It is only for you. Selling or giving away this medication is dangerous and against the law. ?Store at room temperature between 20 and 25 degrees C (68 and 77 degrees F). Get rid of any unused medication after the expiration date. ?This medication may cause harm and death if it is taken by other adults, children, or pets. It is important to get rid of the medication as soon as you no longer need it or it is expired. You can do this in two ways: ?Take the medication to a medication take-back program. Check with your pharmacy or law enforcement to find a location. ?If you cannot return the  medication, check the label or package insert to see if the medication should be thrown out in the garbage or flushed down the toilet. If you are not sure, ask your care team. If it is safe to put it in the trash, take the medication out of the container. Mix the medication with cat litter, dirt, coffee grounds, or other unwanted substance. Seal the mixture in a bag or container. Put it in the trash. ?NOTE: This sheet is a summary. It may not cover all possible information. If you have questions about this medicine, talk to your doctor, pharmacist, or health care provider. ?? 2023 Elsevier/Gold Standard (2021-12-15 00:00:00) ? ?

## 2022-04-10 ENCOUNTER — Other Ambulatory Visit: Payer: Self-pay

## 2022-04-10 ENCOUNTER — Encounter: Payer: Self-pay | Admitting: Family Medicine

## 2022-04-10 ENCOUNTER — Other Ambulatory Visit: Payer: Self-pay | Admitting: Family Medicine

## 2022-04-10 MED ORDER — PANTOPRAZOLE SODIUM 20 MG PO TBEC: DELAYED_RELEASE_TABLET | ORAL | 3 refills | Status: DC

## 2022-04-18 ENCOUNTER — Ambulatory Visit: Payer: BLUE CROSS/BLUE SHIELD | Admitting: Family Medicine

## 2022-04-18 DIAGNOSIS — J309 Allergic rhinitis, unspecified: Secondary | ICD-10-CM

## 2022-04-18 NOTE — Progress Notes (Deleted)
   [address] [place] [address] Dept: [phone]  FOLLOW UP NOTE  Patient ID: Debra Nelson, female    DOB: Nov 15, 1981  Age: 41 y.o. MRN: [id number] Date of Office Visit: 04/18/2022  Assessment  Chief Complaint: No chief complaint on file.  HPI Debra Nelson is a 41 year old female who presents the clinic for follow-up visit.  She was last seen in this clinic on 02/14/2022 by Dr. Maudie Mercury for evaluation of asthma, allergic rhinitis, rate current infection, pruritus, hymenoptera allergy, and reflux.  She had negative environmental allergy skin testing on 02/14/2022   Drug Allergies:  Allergies  Allergen Reactions   Other Other (See Comments)    Other reaction(s): muscle spasms   Klonopin [Clonazepam] Nausea Only   Promethazine Hcl Other (See Comments)    IV causes muscle tics, may take oral, or give benadryl prior to IV   Topamax [Topiramate] Nausea And Vomiting    confusion    Physical Exam: There were no vitals taken for this visit.   Physical Exam  Diagnostics:    Assessment and Plan: No diagnosis found.  No orders of the defined types were placed in this encounter.   There are no Patient Instructions on file for this visit.  No follow-ups on file.    Thank you for the opportunity to care for this patient.  Please do not hesitate to contact me with questions.  Gareth Raucci, FNP [facility]

## 2022-04-18 NOTE — Patient Instructions (Incomplete)
Asthma Continue Trelegy 200-1 puff once a day to prevent cough or wheeze Begin Xopenex 2 puffs once every 4-6 hours as needed for cough or wheeze.  This will replace albuterol You may use Xopenex 2 puffs 5 to 15 minutes before activity to decrease cough or wheeze.  This will replace albuterol For asthma flare, begin budesonide via nebulizer twice a day for 1 to 2 weeks or until cough or wheeze free.  Pretreat with Xopenex  Chronic rhinitis Continue Nasacort 2 sprays in each nostril once a day as needed for stuffy nose Consider saline nasal rinses as needed for nasal symptoms. Use this before any medicated nasal sprays for best result  Recurrent infections Keep track of infections and antibiotic use Get the labs that were ordered at your last visit to help [place] evaluate your immune function  Hymenoptera allergy Continue to avoid stinging insects Get the lab work that was ordered at last visit to help [place] evaluate your hymenoptera allergy  Reflux Continue dietary and lifestyle modifications as listed below Continue to take your proton pump inhibitor as previously prescribed  Call the clinic if this treatment plan is not working well for you.  Follow up in *** or sooner if needed.   Lifestyle Changes for Controlling GERD When you have GERD, stomach acid feels as if it's backing up toward your mouth. Whether or not you take medication to control your GERD, your symptoms can often be improved with lifestyle changes.   Raise Your Head Reflux is more likely to strike when you're lying down flat, because stomach fluid can flow backward more easily. Raising the head of your bed 4-6 inches can help. To do this: Slide blocks or books under the legs at the head of your bed. Or, place a wedge under the mattress. Many foam stores can make a suitable wedge for you. The wedge should run from your waist to the top of your head. Don't just prop your head on several pillows. This increases pressure  on your stomach. It can make GERD worse.  Watch Your Eating Habits Certain foods may increase the acid in your stomach or relax the lower esophageal sphincter, making GERD more likely. It's best to avoid the following: Coffee, tea, and carbonated drinks (with and without caffeine) Fatty, fried, or spicy food Mint, chocolate, onions, and tomatoes Any other foods that seem to irritate your stomach or cause you pain  Relieve the Pressure Eat smaller meals, even if you have to eat more often. Don't lie down right after you eat. Wait a few hours for your stomach to empty. Avoid tight belts and tight-fitting clothes. Lose excess weight.  Tobacco and Alcohol Avoid smoking tobacco and drinking alcohol. They can make GERD symptoms worse.

## 2022-04-24 ENCOUNTER — Other Ambulatory Visit: Payer: Self-pay | Admitting: Neurology

## 2022-04-28 ENCOUNTER — Encounter: Payer: Self-pay | Admitting: Neurology

## 2022-05-01 ENCOUNTER — Other Ambulatory Visit: Payer: Self-pay | Admitting: Neurology

## 2022-05-01 MED ORDER — ONDANSETRON 4 MG PO TBDP: 8.0000 mg | ORAL_TABLET | Freq: Three times a day (TID) | ORAL | 2 refills | Status: DC | PRN

## 2022-05-12 ENCOUNTER — Ambulatory Visit (INDEPENDENT_AMBULATORY_CARE_PROVIDER_SITE_OTHER): Payer: BLUE CROSS/BLUE SHIELD | Admitting: Family

## 2022-05-12 ENCOUNTER — Encounter: Payer: Self-pay | Admitting: Family

## 2022-05-12 VITALS — BP 111/74 | HR 87 | Temp 98.2°F | Ht 61.0 in | Wt 152.1 lb

## 2022-05-12 DIAGNOSIS — R300 Dysuria: Secondary | ICD-10-CM | POA: Diagnosis not present

## 2022-05-12 DIAGNOSIS — D225 Melanocytic nevi of trunk: Secondary | ICD-10-CM | POA: Insufficient documentation

## 2022-05-12 DIAGNOSIS — T3695XA Adverse effect of unspecified systemic antibiotic, initial encounter: Secondary | ICD-10-CM | POA: Diagnosis not present

## 2022-05-12 DIAGNOSIS — B379 Candidiasis, unspecified: Secondary | ICD-10-CM

## 2022-05-12 LAB — POCT URINALYSIS DIPSTICK
Bilirubin, UA: NEGATIVE
Glucose, UA: NEGATIVE
Ketones, UA: NEGATIVE
Leukocytes, UA: NEGATIVE
Nitrite, UA: NEGATIVE
Protein, UA: POSITIVE — AB
Spec Grav, UA: 1.02 (ref 1.010–1.025)
Urobilinogen, UA: 0.2 E.U./dL
pH, UA: 6 (ref 5.0–8.0)

## 2022-05-12 MED ORDER — FLUCONAZOLE 150 MG PO TABS: 150.0000 mg | ORAL_TABLET | ORAL | 0 refills | Status: DC | PRN

## 2022-05-12 MED ORDER — SULFAMETHOXAZOLE-TRIMETHOPRIM 800-160 MG PO TABS: 1.0000 | ORAL_TABLET | Freq: Two times a day (BID) | ORAL | 0 refills | Status: DC

## 2022-05-12 NOTE — Progress Notes (Signed)
Subjective:     Patient ID: Debra Nelson, female    DOB: March 13, 1981, 41 y.o.   MRN: [id number]  Chief Complaint  Patient presents with   Dysuria    Pt c/o dysuria , urinary frequency, pain in pelvic area and nausea. Present for a couple months off and on and has been using AZO's and it would go away. Pt states symptoms are usually after or before a cycle.     HPI: Urinary symptoms: Patient c/o  dysuria, frequency, urgency, flank pain, pelvic pain, low back pain, foul odor, cloudy urine, hematuria. Other sx: vaginal d/c none, vaginal itching none. Duration of sx: 3 days; Home tx: AZO; Denies fever, reports nausea. Reports last UTI over a year ago.    Assessment & Plan:   Problem List Items Addressed This Visit   None Visit Diagnoses     Dysuria    -  Primary UA negative. pt taking AZO, will send urine out for culture, pt very uncomfortable with nausea and pelvic pain, has had sx for 2 months off and on, takes AZO for 2-3d, feels better, but sx return, sending Bactrim & diflucan, advised on use & SE.    Relevant Medications   sulfamethoxazole-trimethoprim (BACTRIM DS) 800-160 MG tablet   Other Relevant Orders   POCT Urinalysis Dipstick (Completed)   Urine Culture   Antibiotic-induced yeast infection       Relevant Medications   sulfamethoxazole-trimethoprim (BACTRIM DS) 800-160 MG tablet   fluconazole (DIFLUCAN) 150 MG tablet      Outpatient Medications Prior to Visit  Medication Sig Dispense Refill   ACETAMINOPHEN PO Take 650 mg by mouth every 8 (eight) hours as needed.     albuterol (PROVENTIL) (2.5 MG/3ML) 0.083% nebulizer solution Take 3 mLs (2.5 mg total) by nebulization every 4 (four) hours as needed for wheezing or shortness of breath (coughing fits). 75 mL 1   albuterol (VENTOLIN HFA) 108 (90 Base) MCG/ACT inhaler SMARTSIG:2 Puff(s) By Mouth Every 4-6 Hours PRN     ALPRAZolam (XANAX XR) 0.5 MG 24 hr tablet 1 tablet in the morning Orally Once a day     ALPRAZolam  (XANAX XR) 1 MG 24 hr tablet Take 0.5 mg by mouth daily.     Armodafinil 250 MG tablet Take 1 tablet (250 mg total) by mouth every morning. 90 tablet 1   BREZTRI AEROSPHERE 160-9-4.8 MCG/ACT AERO Inhale 2 puffs into the lungs in the morning and at bedtime. 10.7 g 5   budesonide (PULMICORT) 0.5 MG/2ML nebulizer solution Take 0.5 mg by nebulization as needed.     cetirizine (ZYRTEC ALLERGY) 10 MG tablet Take 1 tablet (10 mg total) by mouth daily. 30 tablet 5   Cholecalciferol (VITAMIN D3 PO) Take 400 Int'l Units/1.6m2 by mouth daily.     cycloSPORINE, PF, (CEQUA) 0.09 % SOLN 1 drop into affected eye     Galcanezumab-gnlm (EMGALITY) 120 MG/ML SOAJ Inject 120 mg into the skin every 30 (thirty) days. 1 mL 4   lamoTRIgine (LAMICTAL) 200 MG tablet Take 400 mg by mouth at bedtime.     Melatonin 5 MG CAPS Take 3-5 mg by mouth as needed.     Multiple Vitamins-Minerals (HAIR SKIN AND NAILS FORMULA PO) Take 1 tablet by mouth daily.      NURTEC 75 MG TBDP TAKE 1 TABLET BY MOUTH DAILY AS NEEDED. AS CLOSE TO ONSET OF MIGRAINE AS POSSIBLE. **MAX 1 TAB/DAY** 8 tablet 8   ondansetron (ZOFRAN-ODT) 4 MG disintegrating tablet  Take 2 tablets (8 mg total) by mouth every 8 (eight) hours as needed for nausea or vomiting. 8 tablet 2   OVER THE COUNTER MEDICATION Take 1-2 tablets by mouth 2 (two) times daily. Magnesium L-theonate- 1 tab in am and 2 tablets at bedtime     pantoprazole (PROTONIX) 40 MG tablet Take 1 tablet (40 mg total) by mouth daily. 90 tablet 3   Probiotic Product (PROBIOTIC DAILY PO) Take 1 tablet by mouth daily.     promethazine (PHENERGAN) 12.5 MG tablet TAKE 1 TABLET BY MOUTH EVERY 6 HOURS AS NEEDED FOR NAUSEA OR VOMITING. 30 tablet 2   Rhubarb (ESTROVEN COMPLETE) 4 MG TABS See admin instructions.     triamcinolone (NASACORT) 55 MCG/ACT AERO nasal inhaler Place 1 spray into the nose 2 (two) times daily. (Patient taking differently: Place 1 spray into the nose daily.) 1 each 5   UNABLE TO FIND Take 50  mg by mouth in the morning, at noon, and at bedtime. Med Name: Ketamine     vitamin B-12 (CYANOCOBALAMIN) 1000 MCG tablet Take 1,000 mcg by mouth daily.     No facility-administered medications prior to visit.   Past Medical History:  Diagnosis Date   Allergy    Anemia    low normal- Iron once a week    Anxiety    Arthritis    Asthma    Bipolar disorder with depression (HCC)    and PTSD   Chicken pox    Chronic migraine w/o aura, not intractable, w stat migr    Depression    DJD (degenerative joint disease)    Severe of the right knee-Dr Creighton-ortho and Dr Rockne Coons, PA-preferred pain managment   Eustachian tube disorder, bilateral    Hearing loss and vertigo   GERD (gastroesophageal reflux disease)    Narcolepsy    Neuromuscular disorder (HCC)    Fibromyalgia   PTSD (post-traumatic stress disorder)    PVC (premature ventricular contraction)    Rosacea    Seasonal allergic rhinitis     Past Surgical History:  Procedure Laterality Date   knee surgery x5     08,11, 12, 13 x2- R knee   NASAL TURBINATE REDUCTION Bilateral 06/2020   right foot surgery     x 3, arthritis/bone spurus. plate placed.    WISDOM TOOTH EXTRACTION      Allergies  Allergen Reactions   Other Other (See Comments)    Other reaction(s): muscle spasms   Klonopin [Clonazepam] Nausea Only   Promethazine Hcl Other (See Comments)    IV causes muscle tics, may take oral, or give benadryl prior to IV   Topamax [Topiramate] Nausea And Vomiting    confusion      Objective:    Physical Exam Vitals and nursing note reviewed.  Constitutional:      Appearance: Normal appearance.  Cardiovascular:     Rate and Rhythm: Normal rate and regular rhythm.  Pulmonary:     Effort: Pulmonary effort is normal.     Breath sounds: Normal breath sounds.  Musculoskeletal:        General: Normal range of motion.  Skin:    General: Skin is warm and dry.  Neurological:     Mental Status: She is alert.   Psychiatric:        Mood and Affect: Mood normal.        Behavior: Behavior normal.     BP 111/74 (BP Location: Left Arm, Patient Position: Sitting, Cuff Size: Large)  Pulse 87   Temp 98.2 F (36.8 C) (Temporal)   Ht 5\' 1"  (1.549 m)   Wt 152 lb 2 oz (69 kg)   LMP 04/29/2022 (Exact Date)   SpO2 99%   BMI 28.74 kg/m  Wt Readings from Last 3 Encounters:  05/12/22 152 lb 2 oz (69 kg)  04/03/22 154 lb (69.9 kg)  03/30/22 152 lb 9.6 oz (69.2 kg)      Meds ordered this encounter  Medications   sulfamethoxazole-trimethoprim (BACTRIM DS) 800-160 MG tablet    Sig: Take 1 tablet by mouth 2 (two) times daily.    Dispense:  6 tablet    Refill:  0    Order Specific Question:   Supervising Provider    Answer:   ANDY, CAMILLE L [[id number]]   fluconazole (DIFLUCAN) 150 MG tablet    Sig: Take 1 tablet (150 mg total) by mouth every three (3) days as needed.    Dispense:  2 tablet    Refill:  0    Order Specific Question:   Supervising Provider    Answer:   ANDY, CAMILLE L [[id number]]    Dulce Sellar, NP

## 2022-05-13 LAB — URINE CULTURE
MICRO NUMBER:: 13564975
SPECIMEN QUALITY:: ADEQUATE

## 2022-05-15 ENCOUNTER — Other Ambulatory Visit: Payer: Self-pay | Admitting: Otolaryngology

## 2022-05-15 DIAGNOSIS — J329 Chronic sinusitis, unspecified: Secondary | ICD-10-CM

## 2022-05-16 ENCOUNTER — Encounter: Payer: Self-pay | Admitting: Family

## 2022-05-16 ENCOUNTER — Ambulatory Visit: Payer: BLUE CROSS/BLUE SHIELD | Admitting: Neurology

## 2022-05-18 ENCOUNTER — Other Ambulatory Visit: Payer: Self-pay | Admitting: Neurology

## 2022-05-18 NOTE — Telephone Encounter (Signed)
Last OV was on 04/03/22.  Next OV is scheduled for 10/02/22.  Last RX was written on 04/19/22 for 30 tabs.   [place] [facility] has been reviewed.

## 2022-06-20 ENCOUNTER — Ambulatory Visit
Admission: RE | Admit: 2022-06-20 | Discharge: 2022-06-20 | Disposition: A | Discharge status: Home / Self Care | Payer: BLUE CROSS/BLUE SHIELD | Source: Ambulatory Visit | Attending: Otolaryngology | Admitting: Otolaryngology

## 2022-06-20 DIAGNOSIS — J329 Chronic sinusitis, unspecified: Secondary | ICD-10-CM

## 2022-06-27 ENCOUNTER — Other Ambulatory Visit: Payer: Self-pay | Admitting: Neurology

## 2022-06-28 NOTE — Telephone Encounter (Signed)
Last OV was on 04/03/22.  Next OV is scheduled for 11/13823.  Last RX was written on 05/18/22 for 30 tabs.   [place] [facility] Database has been reviewed.

## 2022-07-26 ENCOUNTER — Other Ambulatory Visit: Payer: Self-pay | Admitting: Neurology

## 2022-08-09 ENCOUNTER — Encounter: Payer: Self-pay | Admitting: Obstetrics & Gynecology

## 2022-08-14 ENCOUNTER — Encounter: Payer: Self-pay | Admitting: *Deleted

## 2022-08-24 ENCOUNTER — Encounter: Payer: Self-pay | Admitting: Obstetrics & Gynecology

## 2022-08-24 ENCOUNTER — Telehealth: Payer: BLUE CROSS/BLUE SHIELD | Admitting: Obstetrics & Gynecology

## 2022-08-24 DIAGNOSIS — N939 Abnormal uterine and vaginal bleeding, unspecified: Secondary | ICD-10-CM | POA: Diagnosis not present

## 2022-08-24 MED ORDER — TRANEXAMIC ACID 650 MG PO TABS: ORAL_TABLET | ORAL | 3 refills | Status: DC

## 2022-08-24 MED ORDER — MEDROXYPROGESTERONE ACETATE 10 MG PO TABS: 10.0000 mg | ORAL_TABLET | Freq: Every day | ORAL | 0 refills | Status: DC

## 2022-08-24 NOTE — Progress Notes (Signed)
TELEHEALTH GYNECOLOGY VISIT ENCOUNTER NOTE  Provider location: [facility] [place] [facility]   Patient location: Home  I connected with Debra Nelson on 08/24/22 at  2:50 PM EDT by video and verified that I am speaking with the correct person using two identifiers.     Currently notes that her periods are still heavy and notes fatigue and paleness following these heavy days.  Additionally, she started having her period every 2 weeks for the last three periods then it was spaced out by 6 weeks.  She feels like she barely recovers from one period, before she starts her next.  At her last visit, options were reviewed, at this time she desires to proceed with intervention as this is impacting her day to day. Notes significant side effects with hormonal options  Previously noted- menses every 3 weeks with HMB for 4-5 days.  Denies intermenstrual bleeding.  Of note, PMDD has somewhat improved with one-a-day menopause.   Stress incontinence significantly improved with weight loss.  Pt scheduled for turbinate surgery Oct 17th.     Past Medical History:  Diagnosis Date   Allergy    Anemia    low normal- Iron once a week    Anxiety    Arthritis    Asthma    Bipolar disorder with depression (HCC)    and PTSD   Chicken pox    Chronic migraine w/o aura, not intractable, w stat migr    Depression    DJD (degenerative joint disease)    Severe of the right knee-Dr Creighton-ortho and Dr Gean Birchwood, PA-preferred pain managment   Eustachian tube disorder, bilateral    Hearing loss and vertigo   GERD (gastroesophageal reflux disease)    Narcolepsy    Neuromuscular disorder (HCC)    Fibromyalgia   PTSD (post-traumatic stress disorder)    PVC (premature ventricular contraction)    Rosacea    Seasonal allergic rhinitis    Past Surgical History:  Procedure Laterality Date   knee surgery x5     08,11, 12, 13 x2- R knee   NASAL TURBINATE REDUCTION Bilateral 06/2020    right foot surgery     x 3, arthritis/bone spurus. plate placed.    WISDOM TOOTH EXTRACTION     The following portions of the patient's history were reviewed and updated as appropriate: allergies, current medications, past family history, past medical history, past social history, past surgical history and problem list.    Review of Systems:  Pertinent items noted in HPI and remainder of comprehensive ROS otherwise negative.  Physical Exam:   General:  Alert, oriented and cooperative.   Mental Status: Normal mood and affect perceived. Normal judgment and thought content.  Physical exam deferred due to nature of the encounter  Labs and Imaging -lab work reviewed- normal ferritin level 02/2022 Assessment and Plan:     1. Abnormal uterine bleeding -plan for TXA in the interim until surgical intervention can be scheduled -pelvic US to rule out underlying etiology -reviewed surgical options such as ablation vs hysterectomy.  Risk/benefit of each options discussed.  Pt desires to proceed with ablation in December -also advised progesterone x 10 days immediately prior to procedure to maximize results and due to unpredictable menses -questions/concerns were addressed and desires to proceed   -surgical referral scheduled and mychart note written to review plan   I discussed the assessment and treatment plan with the patient. The patient was provided an opportunity to ask questions and all  were answered. The patient agreed with the plan and demonstrated an understanding of the instructions.   The patient was advised to call back or seek an in-person evaluation/go to the ED if the symptoms worsen or if the condition fails to improve as anticipated.  I provided 20 minutes of non-face-to-face time during this encounter.   Annalee Genta, [place] for [employer], [place]

## 2022-08-24 NOTE — Addendum Note (Signed)
Addended by: Annalee Genta on: 08/24/2022 04:41 PM   Modules accepted: Orders

## 2022-08-25 ENCOUNTER — Ambulatory Visit (HOSPITAL_BASED_OUTPATIENT_CLINIC_OR_DEPARTMENT_OTHER)
Admission: RE | Admit: 2022-08-25 | Discharge: 2022-08-25 | Disposition: A | Discharge status: Home / Self Care | Payer: BLUE CROSS/BLUE SHIELD | Source: Ambulatory Visit | Attending: Obstetrics & Gynecology | Admitting: Obstetrics & Gynecology

## 2022-08-25 ENCOUNTER — Encounter: Payer: Self-pay | Admitting: Obstetrics & Gynecology

## 2022-08-25 DIAGNOSIS — N939 Abnormal uterine and vaginal bleeding, unspecified: Secondary | ICD-10-CM | POA: Insufficient documentation

## 2022-09-04 ENCOUNTER — Encounter: Payer: Self-pay | Admitting: Obstetrics & Gynecology

## 2022-09-11 ENCOUNTER — Encounter: Payer: Self-pay | Admitting: *Deleted

## 2022-09-18 ENCOUNTER — Encounter: Payer: Self-pay | Admitting: Family Medicine

## 2022-09-21 ENCOUNTER — Telehealth: Payer: Self-pay | Admitting: *Deleted

## 2022-09-21 NOTE — Telephone Encounter (Signed)
Debra Nelson (Key: [id number])PT #: [id number] Emgality '120MG'$ /ML auto-injectors (migraine)[employer] [facility] done. Awaiting determination.

## 2022-09-21 NOTE — Telephone Encounter (Signed)
?   Your request has been approved [id number];KTCCEQ:[id number];Review Type:Prior Auth;Coverage Start Date:08/22/2022;Coverage End Date:09/21/2023.

## 2022-10-01 NOTE — Progress Notes (Unsigned)
PATIENT: Debra Nelson DOB: Jan 14, 1981  REASON FOR VISIT: follow up HISTORY FROM: patient  Virtual Visit via Video Note  I connected with Debra Nelson on 10/01/22 at  1:30 PM EST by a video enabled telemedicine application located remotely at [facility] and verified that I am speaking with the correct person using two identifiers who was located at their own home.   I discussed the limitations of evaluation and management by telemedicine and the availability of in person appointments. The patient expressed understanding and agreed to proceed.   PATIENT: Debra Nelson DOB: 12/26/1980  REASON FOR VISIT: follow up HISTORY FROM: patient  HISTORY OF PRESENT ILLNESS: Today 10/01/22:  Debra Nelson is a 41 year old female with a history of migraine headaches, narcolepsy and restless leg syndrome.  She returns today for follow-up.  Narcolepsy: Continues on armodafinil 250 mg daily reports that this is working well for her.  Denies any significant daytime sleepiness or fatigue  Migraines: Headaches under good control.  Around her menstrual cycle she will get a couple of headaches. Severity has improved with Emgality.  Nurtec works well for abortive therapy  Restless leg syndrome: No longer on Requip and is now on ketamine prescribed by another provider.  She does report that she will be having nasal surgery tomorrow.  HISTORY 08/02/21:   Debra Nelson is a 41 year old female with a history of migraine headaches, narcolepsy and restless legs.  She returns today for follow-up.  She reports that Emgality continues to work well for her headaches.  She has noticed significant decrease in the frequency.  She is currently on armodafinil 250 mg daily.  She states that she was also given 150 mg prescription but had to have this filled at another pharmacy so insurance would give it to her?  She states that she was taking the 250 mg in the 150 mg.  I advised the patient that  I do not see this in Dr. Edwena Felty note.  Patient is currently taking Requip 0.5 mg at bedtime this has been prescribed by another provider.  She reports that it works well for her.  She returns today for an evaluation  REVIEW OF SYSTEMS: Out of a complete 14 system review of symptoms, the patient complains only of the following symptoms, and all other reviewed systems are negative.  ALLERGIES: Allergies  Allergen Reactions   Other Other (See Comments)    Other reaction(s): muscle spasms   Klonopin [Clonazepam] Nausea Only   Promethazine Hcl Other (See Comments)    IV causes muscle tics, may take oral, or give benadryl prior to IV   Topamax [Topiramate] Nausea And Vomiting    confusion    HOME MEDICATIONS: Outpatient Medications Prior to Visit  Medication Sig Dispense Refill   ACETAMINOPHEN PO Take 650 mg by mouth every 8 (eight) hours as needed.     albuterol (PROVENTIL) (2.5 MG/3ML) 0.083% nebulizer solution Take 3 mLs (2.5 mg total) by nebulization every 4 (four) hours as needed for wheezing or shortness of breath (coughing fits). 75 mL 1   albuterol (VENTOLIN HFA) 108 (90 Base) MCG/ACT inhaler SMARTSIG:2 Puff(s) By Mouth Every 4-6 Hours PRN     ALPRAZolam (XANAX XR) 0.5 MG 24 hr tablet 1 tablet in the morning Orally Once a day     Armodafinil 250 MG tablet TAKE 1 TABLET BY MOUTH EVERY DAY IN THE MORNING 30 tablet 5   BREZTRI AEROSPHERE 160-9-4.8 MCG/ACT AERO Inhale 2 puffs into the  lungs in the morning and at bedtime. 10.7 g 5   budesonide (PULMICORT) 0.5 MG/2ML nebulizer solution Take 0.5 mg by nebulization as needed.     cetirizine (ZYRTEC ALLERGY) 10 MG tablet Take 1 tablet (10 mg total) by mouth daily. 30 tablet 5   Galcanezumab-gnlm (EMGALITY) 120 MG/ML SOAJ Inject 120 mg into the skin every 30 (thirty) days. 1 mL 4   lamoTRIgine (LAMICTAL) 200 MG tablet Take 400 mg by mouth at bedtime.     medroxyPROGESTERone (PROVERA) 10 MG tablet Take 1 tablet (10 mg total) by mouth daily  for 14 days. Prior to surgery 10 tablet 0   Melatonin 5 MG CAPS Take 3-5 mg by mouth as needed.     Multiple Vitamins-Minerals (HAIR SKIN AND NAILS FORMULA PO) Take 1 tablet by mouth daily.      NURTEC 75 MG TBDP TAKE 1 TABLET BY MOUTH DAILY AS NEEDED. AS CLOSE TO ONSET OF MIGRAINE AS POSSIBLE. **MAX 1 TAB/DAY** 8 tablet 8   ondansetron (ZOFRAN-ODT) 4 MG disintegrating tablet TAKE 2 TABLETS BY MOUTH EVERY 8 HOURS AS NEEDED FOR NAUSEA OR VOMITING. 8 tablet 2   OVER THE COUNTER MEDICATION Take 1-2 tablets by mouth 2 (two) times daily. Magnesium L-theonate- 1 tab in am and 2 tablets at bedtime     pantoprazole (PROTONIX) 40 MG tablet Take 1 tablet (40 mg total) by mouth daily. (Patient taking differently: Take 20 mg by mouth daily.) 90 tablet 3   Probiotic Product (PROBIOTIC DAILY PO) Take 1 tablet by mouth daily.     promethazine (PHENERGAN) 12.5 MG tablet TAKE 1 TABLET BY MOUTH EVERY 6 HOURS AS NEEDED FOR NAUSEA OR VOMITING. 30 tablet 2   Rhubarb (ESTROVEN COMPLETE) 4 MG TABS See admin instructions. (Patient not taking: Reported on 08/24/2022)     sulfamethoxazole-trimethoprim (BACTRIM DS) 800-160 MG tablet Take 1 tablet by mouth 2 (two) times daily. (Patient not taking: Reported on 08/24/2022) 6 tablet 0   tranexamic acid (LYSTEDA) 650 MG TABS tablet Take up to 2 pills, three times daily as needed for heavy bleeding.  Maximum of 5 days 30 tablet 3   triamcinolone (NASACORT) 55 MCG/ACT AERO nasal inhaler Place 1 spray into the nose 2 (two) times daily. (Patient taking differently: Place 1 spray into the nose daily.) 1 each 5   UNABLE TO FIND Take 50 mg by mouth in the morning, at noon, and at bedtime. Med Name: Ketamine     vitamin B-12 (CYANOCOBALAMIN) 1000 MCG tablet Take 1,000 mcg by mouth daily. (Patient not taking: Reported on 08/24/2022)     No facility-administered medications prior to visit.    PAST MEDICAL HISTORY: Past Medical History:  Diagnosis Date   Allergy    Anemia    low normal-  Iron once a week    Anxiety    Arthritis    Asthma    Bipolar disorder with depression (HCC)    and PTSD   Chicken pox    Chronic migraine w/o aura, not intractable, w stat migr    Depression    DJD (degenerative joint disease)    Severe of the right knee-Dr Creighton-ortho and Dr Gean Birchwood, PA-preferred pain managment   Eustachian tube disorder, bilateral    Hearing loss and vertigo   GERD (gastroesophageal reflux disease)    Narcolepsy    Neuromuscular disorder (HCC)    Fibromyalgia   PTSD (post-traumatic stress disorder)    PVC (premature ventricular contraction)    Rosacea  Seasonal allergic rhinitis     PAST SURGICAL HISTORY: Past Surgical History:  Procedure Laterality Date   knee surgery x5     08,11, 12, 13 x2- R knee   NASAL TURBINATE REDUCTION Bilateral 06/2020   right foot surgery     x 3, arthritis/bone spurus. plate placed.    WISDOM TOOTH EXTRACTION      FAMILY HISTORY: Family History  Problem Relation Age of Onset   Mental illness Mother        does not speak to regularly so does not know full history   Migraines Mother    Other Father        medical issues, divorce related   Other Brother        ptsd- fully discharged medically disabled before age 88   Heart murmur Brother    Headache Brother        cluster   Cancer Paternal Grandfather        type unknown   Migraines Other    Colon cancer Neg Hx    Colon polyps Neg Hx    Esophageal cancer Neg Hx    Rectal cancer Neg Hx    Stomach cancer Neg Hx     SOCIAL HISTORY: Social History   Socioeconomic History   Marital status: Married    Spouse name: [name]   Number of children: 3   Years of education: College   Highest education level: Not on file  Occupational History    Employer: OTHER  Tobacco Use   Smoking status: Never   Smokeless tobacco: Never  Vaping Use   Vaping Use: Never used  Substance and Sexual Activity   Alcohol use: No   Drug use: No   Sexual activity: Yes     Partners: Male    Birth control/protection: None, Surgical    Comment: vasectomy  Other Topics Concern   Not on file  Social History Narrative   Patient is married ([name]) and lives at home with her family   4 adults - 2 kids 50% of the time (1 son, 1 daughter). 2 older children (one age 73 from rape, then 31 year old- does not get to see- related to bipolar) Moved in with in laws to help them      Patient has 4 children (3 of her own and 1 step son).    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   Social Determinants of Health   Financial Resource Strain: Medium Risk (11/09/2021)   Overall Financial Resource Strain (CARDIA)    Difficulty of Paying Living Expenses: Somewhat hard  Food Insecurity: Food Insecurity Present (11/09/2021)   Hunger Vital Sign    Worried About Running Out of Food in the Last Year: Sometimes true    Ran Out of Food in the Last Year: Never true  Transportation Needs: No Transportation Needs (11/09/2021)   PRAPARE - Hydrologist (Medical): No    Lack of Transportation (Non-Medical): No  Physical Activity: Inactive (11/09/2021)   Exercise Vital Sign    Days of Exercise per Week: 0 days    Minutes of Exercise per Session: 0 min  Stress: Stress Concern Present (11/09/2021)   [place]    Feeling  of Stress : To some extent  Social Connections: Socially Integrated (11/09/2021)   Social Connection and Isolation Panel [NHANES]    Frequency of Communication with Friends and Family: More than three times a week    Frequency of Social Gatherings with Friends and Family: Not on file    Attends Religious Services: 1 to 4 times per year    Active Member of [employer] or Organizations: Yes    Attends Archivist Meetings: More  than 4 times per year    Marital Status: Married  Human resources officer Violence: Not At Risk (11/09/2021)   Humiliation, Afraid, Rape, and Kick questionnaire    Fear of Current or Ex-Partner: No    Emotionally Abused: No    Physically Abused: No    Sexually Abused: No      PHYSICAL EXAM Generalized: Well developed, in no acute distress   Neurological examination  Mentation: Alert oriented to time, place, history taking. Follows all commands speech and language fluent Cranial nerve II-XII:Extraocular movements were full. Facial symmetry noted.  Head turning and shoulder shrug  were normal and symmetric.  DIAGNOSTIC DATA (LABS, IMAGING, TESTING) - I reviewed patient records, labs, notes, testing and imaging myself where available.  Lab Results  Component Value Date   WBC 7.1 03/01/2022   HGB 13.9 03/01/2022   HCT 41.9 03/01/2022   MCV 94.7 03/01/2022   PLT 386.0 03/01/2022      Component Value Date/Time   NA 138 03/01/2022 0839   NA 138 04/16/2020 1145   K 3.7 03/01/2022 0839   CL 101 03/01/2022 0839   CO2 28 03/01/2022 0839   GLUCOSE 107 (H) 03/01/2022 0839   BUN 5 (L) 03/01/2022 0839   BUN 11 04/16/2020 1145   CREATININE 0.79 03/01/2022 0839   CREATININE 0.77 01/14/2021 1647   CALCIUM 9.5 03/01/2022 0839   PROT 6.9 03/01/2022 0839   PROT 7.2 07/10/2019 1008   ALBUMIN 4.8 03/01/2022 0839   ALBUMIN 5.2 (H) 07/10/2019 1008   AST 19 03/01/2022 0839   ALT 18 03/01/2022 0839   ALKPHOS 125 (H) 03/01/2022 0839   BILITOT 0.5 03/01/2022 0839   BILITOT 0.7 07/10/2019 1008   GFRNONAA 96 01/14/2021 1647   GFRAA 111 01/14/2021 1647   Lab Results  Component Value Date   CHOL 229 (H) 03/01/2022   HDL 47.70 03/01/2022   LDLCALC 152 (H) 03/01/2022   TRIG 146.0 03/01/2022   CHOLHDL 5 03/01/2022   Lab Results  Component Value Date   HGBA1C 4.9 01/30/2018   Lab Results  Component Value Date   VITAMINB12 396 03/01/2022   Lab Results  Component Value Date   TSH 1.91  03/01/2022      ASSESSMENT AND PLAN 41 y.o. year old female  has a past medical history of Allergy, Anemia, Anxiety, Arthritis, Asthma, Bipolar disorder with depression ([place]), Chicken pox, Chronic migraine w/o aura, not intractable, w stat migr, Depression, DJD (degenerative joint disease), Eustachian tube disorder, bilateral, GERD (gastroesophageal reflux disease), Narcolepsy, Neuromuscular disorder ([place]), PTSD (post-traumatic stress disorder), PVC (premature ventricular contraction), Rosacea, and Seasonal allergic rhinitis. here with:  1.  Migraine headaches   -- Continue Emgality monthly injection --Continue Nurtec for abortive therapy   2.  Narcolepsy   --Continue armodafinil 250 mg daily    Advised if symptoms worsen or she develops new symptoms she should let us know. She will follow-up in 6 months or sooner if needed   Ward Givens, MSN, NP-C 10/01/2022, 4:42 PM [facility]  [place], [place], [place] [address] [phone]

## 2022-10-02 ENCOUNTER — Telehealth (INDEPENDENT_AMBULATORY_CARE_PROVIDER_SITE_OTHER): Payer: BLUE CROSS/BLUE SHIELD | Admitting: Adult Health

## 2022-10-02 DIAGNOSIS — G47411 Narcolepsy with cataplexy: Secondary | ICD-10-CM | POA: Diagnosis not present

## 2022-10-02 DIAGNOSIS — G43709 Chronic migraine without aura, not intractable, without status migrainosus: Secondary | ICD-10-CM

## 2022-10-25 NOTE — Patient Instructions (Signed)
Debra Nelson  10/25/2022     '@[email or web address]'$ @   Your procedure is scheduled on  10/31/2022.   Report to [facility] at  0800  A.M.   Call this number if you have problems the morning of surgery:  [phone]  If you experience any cold or flu symptoms such as cough, fever, chills, shortness of breath, etc. between now and your scheduled surgery, please notify us at the above number.   Remember:  Do not eat or drink after midnight.          Use your nebulizer and your inhaler before you come and bring your rescue inhaler with you.     Take these medicines the morning of surgery with A SIP OF WATER        Xanax(if needed), zyrtec, nurtec(if needed), zofran(if needed), pantoprazole.     Do not wear jewelry, make-up or nail polish.  Do not wear lotions, powders, or perfumes, or deodorant.  Do not shave 48 hours prior to surgery.  Men may shave face and neck.  Do not bring valuables to the hospital.  [facility] is not responsible for any belongings or valuables.  Contacts, dentures or bridgework may not be worn into surgery.  Leave your suitcase in the car.  After surgery it may be brought to your room.  For patients admitted to the hospital, discharge time will be determined by your treatment team.  Patients discharged the day of surgery will not be allowed to drive home and must have someone with them for 24 hours.    Special instructions:   DO NOT smoke tobacco or vape for 24 hours before your procedure.  Please read over the following fact sheets that you were given. Pain Booklet, Coughing and Deep Breathing, Surgical Site Infection Prevention, Anesthesia Post-op Instructions, and Care and Recovery After Surgery      Dilation and Curettage or Vacuum Curettage, Care After The following information offers guidance on how to care for yourself after your procedure. Your doctor may also give you more specific instructions. If you have problems or  questions, contact your doctor. What can I expect after the procedure? After the procedure, it is common to have: Mild pain or cramps. Some bleeding or spotting from the vagina. These may last for up to 2 weeks. Follow these instructions at home: Medicines Take over-the-counter and prescription medicines only as told by your doctor. If told, take steps to prevent problems with pooping (constipation). You may need to: Drink enough fluid to keep your pee (urine) pale yellow. Take medicines. You will be told what medicines to take. Eat foods that are high in fiber. These include beans, whole grains, and fresh fruits and vegetables. Limit foods that are high in fat and sugar. These include fried or sweet foods. Ask your doctor if you should avoid driving or using machines while you are taking your medicine. Activity  If you were given a medicine to help you relax (sedative) during your procedure, it can affect you for many hours. Do not drive or use machinery until your doctor says that it is safe. Rest as told by your doctor. Get up to take short walks every 1-2 hours. Ask for help if you feel weak or unsteady. Do not lift anything that is heavier than 10 lb (4.5 kg), or the limit that you are told. Return to your normal activities when your doctor says that it is safe. Lifestyle For  at least 2 weeks, or as long as told by your doctor: Do not douche. Do not use tampons. Do not have sex. General instructions Do not take baths, swim, or use a hot tub. Ask your doctor if you may take showers. Do not smoke or use any products that contain nicotine or tobacco. These can delay healing. If you need help quitting, ask your doctor. Wear compression stockings as told by your doctor. It is up to you to get the results of your procedure. Ask how to get your results when they are ready. Keep all follow-up visits. Contact a doctor if: You have very bad cramps that get worse or do not get better with  medicine. You have very bad pain in your belly (abdomen). You cannot drink fluids without vomiting. You have pain in the area just above your thighs. You have fluid from your vagina that smells bad. You have a rash. Get help right away if: You are bleeding a lot from your vagina. This means soaking more than one sanitary pad in 1 hour, and this happens for 2 hours in a row. You have a fever that is above 100.28F (38C). Your belly feels very tender or hard. You have chest pain. You have trouble breathing. You feel dizzy or light-headed. You faint. You have pain in your neck or shoulder area. These symptoms may be an emergency. Get help right away. Call your local emergency services (911 in the [place] Do not wait to see if the symptoms will go away. Do not drive yourself to the hospital. Summary After your procedure, it is common to have pain or cramping. It is also common to have bleeding or spotting from your vagina. Rest as told. Get up to take short walks every 1-2 hours. Do not lift anything that is heavier than 10 lb (4.5 kg), or the limit that you are told. Get help right away if you have problems from the procedure. Ask your doctor what problems to watch for. This information is not intended to replace advice given to you by your health care provider. Make sure you discuss any questions you have with your health care provider. Document Revised: 10/27/2020 Document Reviewed: 10/27/2020 Elsevier Patient Education  [place] Anesthesia, Adult, Care After The following information offers guidance on how to care for yourself after your procedure. Your health care provider may also give you more specific instructions. If you have problems or questions, contact your health care provider. What can I expect after the procedure? After the procedure, it is common for people to: Have pain or discomfort at the IV site. Have nausea or vomiting. Have a sore throat or  hoarseness. Have trouble concentrating. Feel cold or chills. Feel weak, sleepy, or tired (fatigue). Have soreness and body aches. These can affect parts of the body that were not involved in surgery. Follow these instructions at home: For the time period you were told by your health care provider:  Rest. Do not participate in activities where you could fall or become injured. Do not drive or use machinery. Do not drink alcohol. Do not take sleeping pills or medicines that cause drowsiness. Do not make important decisions or sign legal documents. Do not take care of children on your own. General instructions Drink enough fluid to keep your urine pale yellow. If you have sleep apnea, surgery and certain medicines can increase your risk for breathing problems. Follow instructions from your health care provider about wearing your sleep device:  Anytime you are sleeping, including during daytime naps. While taking prescription pain medicines, sleeping medicines, or medicines that make you drowsy. Return to your normal activities as told by your health care provider. Ask your health care provider what activities are safe for you. Take over-the-counter and prescription medicines only as told by your health care provider. Do not use any products that contain nicotine or tobacco. These products include cigarettes, chewing tobacco, and vaping devices, such as e-cigarettes. These can delay incision healing after surgery. If you need help quitting, ask your health care provider. Contact a health care provider if: You have nausea or vomiting that does not get better with medicine. You vomit every time you eat or drink. You have pain that does not get better with medicine. You cannot urinate or have bloody urine. You develop a skin rash. You have a fever. Get help right away if: You have trouble breathing. You have chest pain. You vomit blood. These symptoms may be an emergency. Get help right  away. Call 911. Do not wait to see if the symptoms will go away. Do not drive yourself to the hospital. Summary After the procedure, it is common to have a sore throat, hoarseness, nausea, vomiting, or to feel weak, sleepy, or fatigue. For the time period you were told by your health care provider, do not drive or use machinery. Get help right away if you have difficulty breathing, have chest pain, or vomit blood. These symptoms may be an emergency. This information is not intended to replace advice given to you by your health care provider. Make sure you discuss any questions you have with your health care provider. Document Revised: 02/03/2022 Document Reviewed: 02/03/2022 Elsevier Patient Education  [place]. How to Use Chlorhexidine Before Surgery Chlorhexidine gluconate (CHG) is a germ-killing (antiseptic) solution that is used to clean the skin. It can get rid of the bacteria that normally live on the skin and can keep them away for about 24 hours. To clean your skin with CHG, you may be given: A CHG solution to use in the shower or as part of a sponge bath. A prepackaged cloth that contains CHG. Cleaning your skin with CHG may help lower the risk for infection: While you are staying in the intensive care unit of the hospital. If you have a vascular access, such as a central line, to provide short-term or long-term access to your veins. If you have a catheter to drain urine from your bladder. If you are on a ventilator. A ventilator is a machine that helps you breathe by moving air in and out of your lungs. After surgery. What are the risks? Risks of using CHG include: A skin reaction. Hearing loss, if CHG gets in your ears and you have a perforated eardrum. Eye injury, if CHG gets in your eyes and is not rinsed out. The CHG product catching fire. Make sure that you avoid smoking and flames after applying CHG to your skin. Do not use CHG: If you have a chlorhexidine  allergy or have previously reacted to chlorhexidine. On babies younger than 29 months of age. How to use CHG solution Use CHG only as told by your health care provider, and follow the instructions on the label. Use the full amount of CHG as directed. Usually, this is one bottle. During a shower Follow these steps when using CHG solution during a shower (unless your health care provider gives you different instructions): Start the shower. Use your normal soap  and shampoo to wash your face and hair. Turn off the shower or move out of the shower stream. Pour the CHG onto a clean washcloth. Do not use any type of brush or rough-edged sponge. Starting at your neck, lather your body down to your toes. Make sure you follow these instructions: If you will be having surgery, pay special attention to the part of your body where you will be having surgery. Scrub this area for at least 1 minute. Do not use CHG on your head or face. If the solution gets into your ears or eyes, rinse them well with water. Avoid your genital area. Avoid any areas of skin that have broken skin, cuts, or scrapes. Scrub your back and under your arms. Make sure to wash skin folds. Let the lather sit on your skin for 1-2 minutes or as long as told by your health care provider. Thoroughly rinse your entire body in the shower. Make sure that all body creases and crevices are rinsed well. Dry off with a clean towel. Do not put any substances on your body afterward--such as powder, lotion, or perfume--unless you are told to do so by your health care provider. Only use lotions that are recommended by the manufacturer. Put on clean clothes or pajamas. If it is the night before your surgery, sleep in clean sheets.  During a sponge bath Follow these steps when using CHG solution during a sponge bath (unless your health care provider gives you different instructions): Use your normal soap and shampoo to wash your face and hair. Pour the  CHG onto a clean washcloth. Starting at your neck, lather your body down to your toes. Make sure you follow these instructions: If you will be having surgery, pay special attention to the part of your body where you will be having surgery. Scrub this area for at least 1 minute. Do not use CHG on your head or face. If the solution gets into your ears or eyes, rinse them well with water. Avoid your genital area. Avoid any areas of skin that have broken skin, cuts, or scrapes. Scrub your back and under your arms. Make sure to wash skin folds. Let the lather sit on your skin for 1-2 minutes or as long as told by your health care provider. Using a different clean, wet washcloth, thoroughly rinse your entire body. Make sure that all body creases and crevices are rinsed well. Dry off with a clean towel. Do not put any substances on your body afterward--such as powder, lotion, or perfume--unless you are told to do so by your health care provider. Only use lotions that are recommended by the manufacturer. Put on clean clothes or pajamas. If it is the night before your surgery, sleep in clean sheets. How to use CHG prepackaged cloths Only use CHG cloths as told by your health care provider, and follow the instructions on the label. Use the CHG cloth on clean, dry skin. Do not use the CHG cloth on your head or face unless your health care provider tells you to. When washing with the CHG cloth: Avoid your genital area. Avoid any areas of skin that have broken skin, cuts, or scrapes. Before surgery Follow these steps when using a CHG cloth to clean before surgery (unless your health care provider gives you different instructions): Using the CHG cloth, vigorously scrub the part of your body where you will be having surgery. Scrub using a back-and-forth motion for 3 minutes. The area on your body  should be completely wet with CHG when you are done scrubbing. Do not rinse. Discard the cloth and let the area  air-dry. Do not put any substances on the area afterward, such as powder, lotion, or perfume. Put on clean clothes or pajamas. If it is the night before your surgery, sleep in clean sheets.  For general bathing Follow these steps when using CHG cloths for general bathing (unless your health care provider gives you different instructions). Use a separate CHG cloth for each area of your body. Make sure you wash between any folds of skin and between your fingers and toes. Wash your body in the following order, switching to a new cloth after each step: The front of your neck, shoulders, and chest. Both of your arms, under your arms, and your hands. Your stomach and groin area, avoiding the genitals. Your right leg and foot. Your left leg and foot. The back of your neck, your back, and your buttocks. Do not rinse. Discard the cloth and let the area air-dry. Do not put any substances on your body afterward--such as powder, lotion, or perfume--unless you are told to do so by your health care provider. Only use lotions that are recommended by the manufacturer. Put on clean clothes or pajamas. Contact a health care provider if: Your skin gets irritated after scrubbing. You have questions about using your solution or cloth. You swallow any chlorhexidine. Call your local poison control center (1-910-319-4002 in the [place] Get help right away if: Your eyes itch badly, or they become very red or swollen. Your skin itches badly and is red or swollen. Your hearing changes. You have trouble seeing. You have swelling or tingling in your mouth or throat. You have trouble breathing. These symptoms may represent a serious problem that is an emergency. Do not wait to see if the symptoms will go away. Get medical help right away. Call your local emergency services (911 in the [place] Do not drive yourself to the hospital. Summary Chlorhexidine gluconate (CHG) is a germ-killing (antiseptic) solution that is used  to clean the skin. Cleaning your skin with CHG may help to lower your risk for infection. You may be given CHG to use for bathing. It may be in a bottle or in a prepackaged cloth to use on your skin. Carefully follow your health care provider's instructions and the instructions on the product label. Do not use CHG if you have a chlorhexidine allergy. Contact your health care provider if your skin gets irritated after scrubbing. This information is not intended to replace advice given to you by your health care provider. Make sure you discuss any questions you have with your health care provider. Document Revised: 03/06/2022 Document Reviewed: 01/17/2021 Elsevier Patient Education  [place].

## 2022-10-26 NOTE — H&P (Signed)
[facility] Attending History and Physical  Debra Nelson is a 41 y.o. [phone]  who presents for scheduled hysteroscopy, D&C, hydrothermal ablation.  In review, she has struggled with abnormal/heavy periods.  Menses are irregular- q 2-6wks.  Bleeding can be heavy and lasts for about a week.  During her period she notes fatigue and paleness.  Denies intermenstrual bleeding.  Tried hormonal options, but noted considerable side effects.    Denies any abnormal vaginal discharge, fevers, chills, sweats, dysuria, nausea, vomiting, other GI or GU symptoms or other general symptoms.  No acute complaints or changes since her last visit.  Past Medical History:  Diagnosis Date   Allergy    Anemia    low normal- Iron once a week    Anxiety    Arthritis    Asthma    Bipolar disorder with depression (HCC)    and PTSD   Chicken pox    Chronic migraine w/o aura, not intractable, w stat migr    Depression    DJD (degenerative joint disease)    Severe of the right knee-Dr Creighton-ortho and Dr Gean Birchwood, PA-preferred pain managment   Eustachian tube disorder, bilateral    Hearing loss and vertigo   GERD (gastroesophageal reflux disease)    Narcolepsy    Neuromuscular disorder (HCC)    Fibromyalgia   PTSD (post-traumatic stress disorder)    PVC (premature ventricular contraction)    Rosacea    Seasonal allergic rhinitis    Past Surgical History:  Procedure Laterality Date   knee surgery x5     08,11, 12, 13 x2- R knee   NASAL TURBINATE REDUCTION Bilateral 06/2020   right foot surgery     x 3, arthritis/bone spurus. plate placed.    WISDOM TOOTH EXTRACTION     OB History  Gravida Para Term Preterm AB Living  '3 3 2 1   3  '$ SAB IAB Ectopic Multiple Live Births          3    # Outcome Date GA Lbr Len/2nd Weight Sex Delivery Anes PTL Lv  3 Preterm           2 Term           1 Term           Patient denies any other pertinent gynecologic issues.  No  current facility-administered medications on file prior to encounter.   Current Outpatient Medications on File Prior to Encounter  Medication Sig Dispense Refill   acetaminophen (TYLENOL) 500 MG tablet Take 1,000 mg by mouth every 6 (six) hours as needed for moderate pain.     ALPRAZolam (XANAX) 0.5 MG tablet Take 0.25 mg by mouth daily as needed for anxiety.     Carboxymethylcellul-Glycerin (LUBRICATING EYE DROPS OP) Place 1 Application into both eyes daily as needed (dry eyes).     cetirizine (ZYRTEC ALLERGY) 10 MG tablet Take 1 tablet (10 mg total) by mouth daily. 30 tablet 5   lamoTRIgine (LAMICTAL) 200 MG tablet Take 400 mg by mouth at bedtime.     medroxyPROGESTERone (PROVERA) 10 MG tablet Take 1 tablet (10 mg total) by mouth daily for 14 days. Prior to surgery 10 tablet 0   Melatonin 5 MG CAPS Take 5 mg by mouth at bedtime.     Multiple Vitamins-Minerals (HAIR SKIN AND NAILS FORMULA PO) Take 1 tablet by mouth daily.      Multiple Vitamins-Minerals (MULTI-VITAMIN MENOPAUSAL) TABS Take 1 tablet by mouth daily.  nitrofurantoin, macrocrystal-monohydrate, (MACROBID) 100 MG capsule Take 100 mg by mouth 2 (two) times daily.     NURTEC 75 MG TBDP TAKE 1 TABLET BY MOUTH DAILY AS NEEDED. AS CLOSE TO ONSET OF MIGRAINE AS POSSIBLE. **MAX 1 TAB/DAY** 8 tablet 8   ondansetron (ZOFRAN-ODT) 4 MG disintegrating tablet TAKE 2 TABLETS BY MOUTH EVERY 8 HOURS AS NEEDED FOR NAUSEA OR VOMITING. 8 tablet 2   OVER THE COUNTER MEDICATION Take 2 tablets by mouth 2 (two) times daily. Magnesium L-theonate     pantoprazole (PROTONIX) 40 MG tablet Take 1 tablet (40 mg total) by mouth daily. (Patient taking differently: Take 20 mg by mouth daily.) 90 tablet 3   Probiotic Product (PROBIOTIC DAILY PO) Take 1 tablet by mouth daily. Take 1 tablet daily for one month then stop for one month then restart     promethazine (PHENERGAN) 12.5 MG tablet TAKE 1 TABLET BY MOUTH EVERY 6 HOURS AS NEEDED FOR NAUSEA OR VOMITING. 30  tablet 2   UNABLE TO FIND Take 50 mg by mouth 3 (three) times daily. Med Name: Ketamine     albuterol (VENTOLIN HFA) 108 (90 Base) MCG/ACT inhaler Inhale 2 puffs into the lungs every 4 (four) hours as needed for shortness of breath or wheezing.     Armodafinil 250 MG tablet TAKE 1 TABLET BY MOUTH EVERY DAY IN THE MORNING (Patient taking differently: Take 250 mg by mouth daily as needed (staying awake).) 30 tablet 5   BREZTRI AEROSPHERE 160-9-4.8 MCG/ACT AERO Inhale 2 puffs into the lungs in the morning and at bedtime. (Patient not taking: Reported on 10/24/2022) 10.7 g 5   budesonide (PULMICORT) 0.5 MG/2ML nebulizer solution Take 0.5 mg by nebulization daily as needed (shortness of breath).     Galcanezumab-gnlm (EMGALITY) 120 MG/ML SOAJ Inject 120 mg into the skin every 30 (thirty) days. 1 mL 4   phenazopyridine (PYRIDIUM) 200 MG tablet Take 200 mg by mouth 3 (three) times daily as needed for pain.     sulfamethoxazole-trimethoprim (BACTRIM DS) 800-160 MG tablet Take 1 tablet by mouth 2 (two) times daily. (Patient not taking: Reported on 08/24/2022) 6 tablet 0   tranexamic acid (LYSTEDA) 650 MG TABS tablet Take up to 2 pills, three times daily as needed for heavy bleeding.  Maximum of 5 days (Patient not taking: Reported on 10/24/2022) 30 tablet 3   triamcinolone (NASACORT) 55 MCG/ACT AERO nasal inhaler Place 1 spray into the nose 2 (two) times daily. (Patient not taking: Reported on 10/24/2022) 1 each 5   Allergies  Allergen Reactions   Klonopin [Clonazepam] Nausea Only   Promethazine Hcl Other (See Comments)    IV causes muscle tics, may take oral, or give benadryl prior to IV   Topamax [Topiramate] Nausea And Vomiting    confusion    Social History:   reports that she has never smoked. She has never used smokeless tobacco. She reports that she does not drink alcohol and does not use drugs. Family History  Problem Relation Age of Onset   Mental illness Mother        does not speak to  regularly so does not know full history   Migraines Mother    Other Father        medical issues, divorce related   Other Brother        ptsd- fully discharged medically disabled before age 73   Heart murmur Brother    Headache Brother        cluster  Cancer Paternal Grandfather        type unknown   Migraines Other    Colon cancer Neg Hx    Colon polyps Neg Hx    Esophageal cancer Neg Hx    Rectal cancer Neg Hx    Stomach cancer Neg Hx     Review of Systems: Pertinent items noted in HPI and remainder of comprehensive ROS otherwise negative.  PHYSICAL EXAM: Blood pressure 109/76, pulse 96, temperature 98.1 F (36.7 C), temperature source Oral, resp. rate 18, height '5\' 1"'$  (1.549 m), weight 69 kg, SpO2 98 %. CONSTITUTIONAL: Well-developed, well-nourished female in no acute distress.  SKIN: Skin is warm and dry. No rash noted. Not diaphoretic. No erythema. No pallor. NEUROLOGIC: Alert and oriented to person, place, and time. Normal reflexes, muscle tone coordination. No cranial nerve deficit noted. PSYCHIATRIC: Normal mood and affect. Normal behavior. Normal judgment and thought content. CARDIOVASCULAR: Normal heart rate noted, regular rhythm RESPIRATORY: Effort and breath sounds normal, no problems with respiration noted ABDOMEN: Soft, nontender, nondistended. PELVIC: deferred MUSCULOSKELETAL: no calf tenderness bilaterally EXT: no edema bilaterally, normal pulses  Labs: Results for orders placed or performed during the hospital encounter of 10/27/22 (from the past 336 hour(s))  CBC   Collection Time: 10/27/22  9:27 AM  Result Value Ref Range   WBC 6.2 4.0 - 10.5 K/uL   RBC 4.20 3.87 - 5.11 MIL/uL   Hemoglobin 13.3 12.0 - 15.0 g/dL   HCT 39.4 36.0 - 46.0 %   MCV 93.8 80.0 - 100.0 fL   MCH 31.7 26.0 - 34.0 pg   MCHC 33.8 30.0 - 36.0 g/dL   RDW 12.2 11.5 - 15.5 %   Platelets 355 150 - 400 K/uL   nRBC 0.0 0.0 - 0.2 %  Basic metabolic panel   Collection Time: 10/27/22   9:27 AM  Result Value Ref Range   Sodium 138 135 - 145 mmol/L   Potassium 3.3 (L) 3.5 - 5.1 mmol/L   Chloride 106 98 - 111 mmol/L   CO2 25 22 - 32 mmol/L   Glucose, Bld 93 70 - 99 mg/dL   BUN 9 6 - 20 mg/dL   Creatinine, Ser 0.68 0.44 - 1.00 mg/dL   Calcium 8.9 8.9 - 10.3 mg/dL   GFR, Estimated >60 >60 mL/min   Anion gap 7 5 - 15  Pregnancy, urine POC   Collection Time: 10/27/22  9:29 AM  Result Value Ref Range   Preg Test, Ur NEGATIVE NEGATIVE    Imaging Studies: [place] completed 08/2022:  8.8 x 3.7 x 4.8 cm = volume: 83 mL. Anteverted. Normal morphology without mass. Small nabothian cysts in cervix.  Normal ovaries bilaterally  Assessment: AUB  Plan: Hysteroscopy, D&C, Hydrothermal ablation -NPO -LR @ 125cc/hr -SCDs to OR -Risk/benefits and alternatives reviewed with the patient including but not limited to risk of bleeding, infection and injury to surrounding organs.  Questions and concerns were addressed and pt desires to proceed  Janyth Pupa, DO Attending [place], [facility] for [facility], [place]

## 2022-10-27 ENCOUNTER — Encounter (HOSPITAL_COMMUNITY)
Admission: RE | Admit: 2022-10-27 | Discharge: 2022-10-27 | Disposition: A | Discharge status: Home / Self Care | Payer: BLUE CROSS/BLUE SHIELD | Source: Ambulatory Visit | Attending: Obstetrics & Gynecology | Admitting: Obstetrics & Gynecology

## 2022-10-27 ENCOUNTER — Encounter (HOSPITAL_COMMUNITY): Payer: Self-pay

## 2022-10-27 VITALS — BP 124/78 | HR 94 | Temp 97.6°F | Resp 16 | Ht 61.0 in | Wt 152.1 lb

## 2022-10-27 DIAGNOSIS — Z01812 Encounter for preprocedural laboratory examination: Secondary | ICD-10-CM | POA: Diagnosis present

## 2022-10-27 DIAGNOSIS — Z01818 Encounter for other preprocedural examination: Secondary | ICD-10-CM

## 2022-10-27 DIAGNOSIS — N939 Abnormal uterine and vaginal bleeding, unspecified: Secondary | ICD-10-CM | POA: Diagnosis not present

## 2022-10-27 LAB — BASIC METABOLIC PANEL
Anion gap: 7 (ref 5–15)
BUN: 9 mg/dL (ref 6–20)
CO2: 25 mmol/L (ref 22–32)
Calcium: 8.9 mg/dL (ref 8.9–10.3)
Chloride: 106 mmol/L (ref 98–111)
Creatinine, Ser: 0.68 mg/dL (ref 0.44–1.00)
GFR, Estimated: >60 mL/min (ref >60)
Glucose, Bld: 93 mg/dL (ref 70–99)
Potassium: 3.3 mmol/L — ABNORMAL LOW (ref 3.5–5.1)
Sodium: 138 mmol/L (ref 135–145)

## 2022-10-27 LAB — CBC
HCT: 39.4 % (ref 36.0–46.0)
Hemoglobin: 13.3 g/dL (ref 12.0–15.0)
MCH: 31.7 pg (ref 26.0–34.0)
MCHC: 33.8 g/dL (ref 30.0–36.0)
MCV: 93.8 fL (ref 80.0–100.0)
Platelets: 355 10*3/uL (ref 150–400)
RBC: 4.2 MIL/uL (ref 3.87–5.11)
RDW: 12.2 % (ref 11.5–15.5)
WBC: 6.2 10*3/uL (ref 4.0–10.5)
nRBC: 0 % (ref 0.0–0.2)

## 2022-10-27 LAB — POCT PREGNANCY, URINE: Preg Test, Ur: NEGATIVE

## 2022-10-31 ENCOUNTER — Encounter (HOSPITAL_COMMUNITY)
Admission: RE | Disposition: A | Discharge status: Home / Self Care | Payer: Self-pay | Source: Home / Self Care | Attending: Obstetrics & Gynecology

## 2022-10-31 ENCOUNTER — Ambulatory Visit (HOSPITAL_COMMUNITY)
Admission: RE | Admit: 2022-10-31 | Discharge: 2022-10-31 | Disposition: A | Discharge status: Home / Self Care | Payer: BLUE CROSS/BLUE SHIELD | Attending: Obstetrics & Gynecology | Admitting: Obstetrics & Gynecology

## 2022-10-31 ENCOUNTER — Ambulatory Visit (HOSPITAL_COMMUNITY): Payer: BLUE CROSS/BLUE SHIELD | Admitting: Certified Registered Nurse Anesthetist

## 2022-10-31 ENCOUNTER — Encounter (HOSPITAL_COMMUNITY): Payer: Self-pay | Admitting: Obstetrics & Gynecology

## 2022-10-31 DIAGNOSIS — N939 Abnormal uterine and vaginal bleeding, unspecified: Secondary | ICD-10-CM | POA: Diagnosis present

## 2022-10-31 DIAGNOSIS — F319 Bipolar disorder, unspecified: Secondary | ICD-10-CM | POA: Diagnosis not present

## 2022-10-31 DIAGNOSIS — Z01818 Encounter for other preprocedural examination: Secondary | ICD-10-CM

## 2022-10-31 DIAGNOSIS — J45909 Unspecified asthma, uncomplicated: Secondary | ICD-10-CM | POA: Insufficient documentation

## 2022-10-31 DIAGNOSIS — M797 Fibromyalgia: Secondary | ICD-10-CM | POA: Diagnosis not present

## 2022-10-31 DIAGNOSIS — F419 Anxiety disorder, unspecified: Secondary | ICD-10-CM | POA: Insufficient documentation

## 2022-10-31 DIAGNOSIS — K219 Gastro-esophageal reflux disease without esophagitis: Secondary | ICD-10-CM | POA: Insufficient documentation

## 2022-10-31 HISTORY — PX: DILITATION & CURRETTAGE/HYSTROSCOPY WITH HYDROTHERMAL ABLATION: SHX5570

## 2022-10-31 SURGERY — DILATATION & CURETTAGE/HYSTEROSCOPY WITH HYDROTHERMAL ABLATION
Anesthesia: General | Site: Vagina

## 2022-10-31 MED ORDER — LIDOCAINE HCL (PF) 2 % IJ SOLN
INTRAMUSCULAR | Status: AC
  Filled 2022-10-31: qty 5

## 2022-10-31 MED ORDER — DEXAMETHASONE SODIUM PHOSPHATE 10 MG/ML IJ SOLN
INTRAMUSCULAR | Status: DC | PRN
  Administered 2022-10-31: 10 mg via INTRAVENOUS

## 2022-10-31 MED ORDER — KETOROLAC TROMETHAMINE 30 MG/ML IJ SOLN
INTRAMUSCULAR | Status: AC
  Administered 2022-10-31: 30 mg
  Filled 2022-10-31: qty 1

## 2022-10-31 MED ORDER — KETOROLAC TROMETHAMINE 15 MG/ML IJ SOLN
30.0000 mg | INTRAMUSCULAR | Status: AC
  Administered 2022-10-31: 30 mg via INTRAVENOUS

## 2022-10-31 MED ORDER — SILVER NITRATE-POT NITRATE 75-25 % EX MISC
CUTANEOUS | Status: DC | PRN
  Administered 2022-10-31: 2

## 2022-10-31 MED ORDER — DEXAMETHASONE SODIUM PHOSPHATE 10 MG/ML IJ SOLN
INTRAMUSCULAR | Status: AC
  Filled 2022-10-31: qty 1

## 2022-10-31 MED ORDER — HYDROCODONE-ACETAMINOPHEN 7.5-325 MG PO TABS
1.0000 | ORAL_TABLET | Freq: Once | ORAL | Status: AC | PRN
  Administered 2022-10-31: 1 via ORAL
  Filled 2022-10-31: qty 1

## 2022-10-31 MED ORDER — ONDANSETRON HCL 4 MG/2ML IJ SOLN
INTRAMUSCULAR | Status: AC
  Filled 2022-10-31: qty 2

## 2022-10-31 MED ORDER — CHLORHEXIDINE GLUCONATE 0.12 % MT SOLN
OROMUCOSAL | Status: AC
  Filled 2022-10-31: qty 15

## 2022-10-31 MED ORDER — FENTANYL CITRATE (PF) 100 MCG/2ML IJ SOLN
INTRAMUSCULAR | Status: DC | PRN
  Administered 2022-10-31 (×3): 25 ug via INTRAVENOUS

## 2022-10-31 MED ORDER — LIDOCAINE 2% (20 MG/ML) 5 ML SYRINGE
INTRAMUSCULAR | Status: DC | PRN
  Administered 2022-10-31: 60 mg via INTRAVENOUS

## 2022-10-31 MED ORDER — OXYCODONE HCL 5 MG PO TABS: 5.0000 mg | ORAL_TABLET | Freq: Four times a day (QID) | ORAL | 0 refills | Status: AC | PRN

## 2022-10-31 MED ORDER — SODIUM CHLORIDE 0.9 % IR SOLN
Status: DC | PRN
  Administered 2022-10-31: 3000 mL

## 2022-10-31 MED ORDER — CHLORHEXIDINE GLUCONATE 0.12 % MT SOLN: 15.0000 mL | Freq: Once | OROMUCOSAL | Status: DC

## 2022-10-31 MED ORDER — LIDOCAINE-EPINEPHRINE 0.5 %-1:200000 IJ SOLN
INTRAMUSCULAR | Status: DC | PRN
  Administered 2022-10-31: 20 mL

## 2022-10-31 MED ORDER — ORAL CARE MOUTH RINSE: 15.0000 mL | Freq: Once | OROMUCOSAL | Status: DC

## 2022-10-31 MED ORDER — IBUPROFEN 600 MG PO TABS: 600.0000 mg | ORAL_TABLET | Freq: Four times a day (QID) | ORAL | 0 refills | Status: AC | PRN

## 2022-10-31 MED ORDER — LIDOCAINE-EPINEPHRINE 0.5 %-1:200000 IJ SOLN
INTRAMUSCULAR | Status: AC
  Filled 2022-10-31: qty 1

## 2022-10-31 MED ORDER — MIDAZOLAM HCL 2 MG/2ML IJ SOLN
INTRAMUSCULAR | Status: AC
  Filled 2022-10-31: qty 2

## 2022-10-31 MED ORDER — FENTANYL CITRATE (PF) 100 MCG/2ML IJ SOLN
INTRAMUSCULAR | Status: AC
  Filled 2022-10-31: qty 2

## 2022-10-31 MED ORDER — FENTANYL CITRATE PF 50 MCG/ML IJ SOSY
25.0000 ug | PREFILLED_SYRINGE | INTRAMUSCULAR | Status: DC | PRN
  Administered 2022-10-31: 50 ug via INTRAVENOUS
  Filled 2022-10-31: qty 1

## 2022-10-31 MED ORDER — LACTATED RINGERS IV SOLN: INTRAVENOUS | Status: DC

## 2022-10-31 MED ORDER — ONDANSETRON HCL 4 MG/2ML IJ SOLN
INTRAMUSCULAR | Status: DC | PRN
  Administered 2022-10-31: 4 mg via INTRAVENOUS

## 2022-10-31 MED ORDER — PROPOFOL 10 MG/ML IV BOLUS
INTRAVENOUS | Status: DC | PRN
  Administered 2022-10-31: 200 mg via INTRAVENOUS

## 2022-10-31 MED ORDER — MIDAZOLAM HCL 5 MG/5ML IJ SOLN
INTRAMUSCULAR | Status: DC | PRN
  Administered 2022-10-31: 2 mg via INTRAVENOUS

## 2022-10-31 MED ORDER — ONDANSETRON HCL 4 MG/2ML IJ SOLN: 4.0000 mg | Freq: Once | INTRAMUSCULAR | Status: DC | PRN

## 2022-10-31 SURGICAL SUPPLY — 20 items
CATH ROBINSON RED A/P 16FR (CATHETERS) ×1 IMPLANT
CLOTH BEACON ORANGE TIMEOUT ST (SAFETY) ×1 IMPLANT
COVER LIGHT HANDLE STERIS (MISCELLANEOUS) ×2 IMPLANT
DILATOR CANAL MILEX (MISCELLANEOUS) IMPLANT
GAUZE 4X4 16PLY ~~LOC~~+RFID DBL (SPONGE) ×1 IMPLANT
GLOVE BIO SURGEON STRL SZ 6.5 (GLOVE) ×1 IMPLANT
GLOVE BIOGEL PI IND STRL 7.0 (GLOVE) ×3 IMPLANT
GOWN STRL REUS W/ TWL LRG LVL3 (GOWN DISPOSABLE) ×1 IMPLANT
GOWN STRL REUS W/TWL LRG LVL3 (GOWN DISPOSABLE) ×2 IMPLANT
IV NS IRRIG 3000ML ARTHROMATIC (IV SOLUTION) ×1 IMPLANT
KIT TURNOVER CYSTO (KITS) ×1 IMPLANT
KIT TURNOVER KIT A (KITS) ×1 IMPLANT
NS IRRIG 1000ML POUR BTL (IV SOLUTION) ×1 IMPLANT
PACK PERI GYN (CUSTOM PROCEDURE TRAY) ×1 IMPLANT
PAD ARMBOARD 7.5X6 YLW CONV (MISCELLANEOUS) ×1 IMPLANT
PAD TELFA 3X4 1S STER (GAUZE/BANDAGES/DRESSINGS) ×1 IMPLANT
SET BASIN LINEN APH (SET/KITS/TRAYS/PACK) ×1 IMPLANT
SET GENESYS HTA PROCERVA (MISCELLANEOUS) ×1 IMPLANT
SYR CONTROL 10ML LL (SYRINGE) ×1 IMPLANT
UNDERPAD 30X36 HEAVY ABSORB (UNDERPADS AND DIAPERS) ×1 IMPLANT

## 2022-10-31 NOTE — Op Note (Signed)
Operative Report  PreOp: 1) Abnormal uterine bleeding PostOp: same Procedure:  Hysteroscopy, Dilation and Curettage, Endometrial ablation Surgeon: Dr. Janyth Pupa Anesthesia: General Complications:none EBL:  10cc IVF:700cc  Findings: 7cm uterus with proliferative endometrium.  Both ostia visualized.  Specimens: 1) endometrial curetting  Procedure: The patient was taken to the operating room where she underwent general anesthesia without difficulty. The patient was placed in a low lithotomy position using Allen stirrups. She was then prepped and draped in the normal sterile fashion. Sterile speculum was placed.  A single tooth tenaculum was placed on the anterior lip of the cervix. Cervical block was completed using 0.5% lidocaine with epinephrine using 20cc  The uterus was then sounded to 7cm. The endocervical canal was then serially dilated to 14 [place] using Hank dilators to accommodate the hydrothermal ablation hysteroscopic apparatus.  The hysteroscope was removed and sharp curettage was performed. The tissue was sent to pathology.   The hydrothermal ablation hysteroscopic apparatus was then reinserted.  Cavity assessment was performed and passed with minimal leakage of fluid.  The hydrothermal ablation was then carried out as per protocol.   Complete ablation of the endometrium was observed and the hysteroscope was removed under direct visualization.  All instrument were then removed. Hemostasis was observed at the cervical site. The patient was repositioned to the supine position. The patient tolerated the procedure without any complications and taken to recovery in stable condition.   Janyth Pupa, DO Attending [address], [facility] for [employer], [place]

## 2022-10-31 NOTE — Discharge Instructions (Addendum)
HOME INSTRUCTIONS  Please note any unusual or excessive bleeding, pain, swelling. Mild dizziness or drowsiness are normal for about 24 hours after surgery.   Shower when comfortable  Restrictions: No driving for 24 hours or while taking pain medications.  Activity:  Nothing in vagina (no tampons, douching, or intercourse) x 2 weeks; no tub baths for  2weeks Vaginal spotting is expected but if your bleeding is heavy, period like,  please call the office   Diet:  You may return to your regular diet.  Do not eat large meals.  Eat small frequent meals throughout the day.  Continue to drink a good amount of water at least 6-8 glasses of water per day, hydration is very important for the healing process.  Pain Management: Take over the counter tylenol or ibuprofen as needed for pain.  You can either take one or alternate between the two medications for pain management.  You may also use a heating pack as needed.    Alcohol -- Avoid for 24 hours and while taking pain medications.  Nausea: Take sips of ginger ale or soda  Fever -- Call physician if temperature over 101 degrees  Follow up:  If you do not already have a follow up appointment scheduled, please call the office at [phone].  If you experience fever (a temperature greater than 100.4), pain unrelieved by pain medication, shortness of breath, swelling of a single leg, or any other symptoms which are concerning to you please the office immediately.

## 2022-10-31 NOTE — Transfer of Care (Signed)
Immediate Anesthesia Transfer of Care Note  Patient: Debra Nelson  Procedure(s) Performed: DILATATION & CURETTAGE/HYSTEROSCOPY WITH HYDROTHERMAL ABLATION (Vagina )  Patient Location: PACU  Anesthesia Type:General  Level of Consciousness: awake, alert , and oriented  Airway & Oxygen Therapy: Patient Spontanous Breathing and Patient connected to nasal cannula oxygen  Post-op Assessment: Report given to RN, Post -op Vital signs reviewed and stable, and Patient moving all extremities  Post vital signs: Reviewed and stable  Last Vitals:  Vitals Value Taken Time  BP 123/83 10/31/22 1152  Temp 36.9 C 10/31/22 1152  Pulse 108 10/31/22 1154  Resp 24 10/31/22 1154  SpO2 97 % 10/31/22 1154  Vitals shown include unvalidated device data.  Last Pain:  Vitals:   10/31/22 1152  TempSrc:   PainSc: 0-No pain      Patients Stated Pain Goal: 4 (61/47/09 2957)  Complications: No notable events documented.

## 2022-10-31 NOTE — Anesthesia Procedure Notes (Signed)
Procedure Name: LMA Insertion Date/Time: 10/31/2022 11:01 AM  Performed by: Genelle Bal, CRNAPre-anesthesia Checklist: Patient identified, Emergency Drugs available, Suction available and Patient being monitored Patient Re-evaluated:Patient Re-evaluated prior to induction Oxygen Delivery Method: Circle system utilized Preoxygenation: Pre-oxygenation with 100% oxygen Induction Type: IV induction Ventilation: Mask ventilation without difficulty LMA: LMA inserted LMA Size: 4.0 Number of attempts: 1 Airway Equipment and Method: Bite block Placement Confirmation: positive ETCO2 Tube secured with: Tape Dental Injury: Teeth and Oropharynx as per pre-operative assessment

## 2022-10-31 NOTE — Anesthesia Postprocedure Evaluation (Signed)
Anesthesia Post Note  Patient: Debra Nelson  Procedure(s) Performed: DILATATION & CURETTAGE/HYSTEROSCOPY WITH HYDROTHERMAL ABLATION (Vagina )  Patient location during evaluation: PACU Anesthesia Type: General Level of consciousness: awake and alert Pain management: pain level controlled Vital Signs Assessment: post-procedure vital signs reviewed and stable Respiratory status: spontaneous breathing, nonlabored ventilation, respiratory function stable and patient connected to nasal cannula oxygen Cardiovascular status: blood pressure returned to baseline and stable Postop Assessment: no apparent nausea or vomiting Anesthetic complications: no   There were no known notable events for this encounter.   Last Vitals:  Vitals:   10/31/22 1215 10/31/22 1229  BP: 120/79 127/81  Pulse: 80 82  Resp: 14 18  Temp:  36.7 C  SpO2: 100% 100%    Last Pain:  Vitals:   10/31/22 1229  TempSrc: Oral  PainSc: 5                  Trixie Rude

## 2022-10-31 NOTE — Anesthesia Preprocedure Evaluation (Signed)
Anesthesia Evaluation  Patient identified by MRN, date of birth, ID band Patient awake    Reviewed: Allergy & Precautions, NPO status , Patient's Chart, lab work & pertinent test results  Airway Mallampati: III       Dental no notable dental hx.    Pulmonary shortness of breath, asthma    Pulmonary exam normal        Cardiovascular negative cardio ROS Normal cardiovascular exam     Neuro/Psych  Headaches PSYCHIATRIC DISORDERS Anxiety Depression Bipolar Disorder      GI/Hepatic Neg liver ROS,GERD  ,,  Endo/Other  negative endocrine ROS    Renal/GU negative Renal ROS     Musculoskeletal  (+) Arthritis , Osteoarthritis,  Fibromyalgia -  Abdominal Normal abdominal exam  (+)   Peds  Hematology  (+) Blood dyscrasia, anemia   Anesthesia Other Findings   Reproductive/Obstetrics                             Anesthesia Physical Anesthesia Plan  ASA: 2  Anesthesia Plan: General   Post-op Pain Management:    Induction: Intravenous  PONV Risk Score and Plan: 2 and Dexamethasone and Ondansetron  Airway Management Planned:   Additional Equipment:   Intra-op Plan:   Post-operative Plan:   Informed Consent: I have reviewed the patients History and Physical, chart, labs and discussed the procedure including the risks, benefits and alternatives for the proposed anesthesia with the patient or authorized representative who has indicated his/her understanding and acceptance.       Plan Discussed with: CRNA  Anesthesia Plan Comments:        Anesthesia Quick Evaluation

## 2022-11-01 ENCOUNTER — Encounter: Payer: Self-pay | Admitting: Obstetrics & Gynecology

## 2022-11-01 LAB — SURGICAL PATHOLOGY

## 2022-11-06 ENCOUNTER — Encounter: Payer: Self-pay | Admitting: Obstetrics & Gynecology

## 2022-11-06 ENCOUNTER — Encounter (HOSPITAL_COMMUNITY): Payer: Self-pay | Admitting: Obstetrics & Gynecology

## 2022-12-04 ENCOUNTER — Other Ambulatory Visit: Payer: Self-pay | Admitting: Adult Health

## 2022-12-04 ENCOUNTER — Other Ambulatory Visit: Payer: Self-pay

## 2022-12-04 ENCOUNTER — Other Ambulatory Visit: Payer: Self-pay | Admitting: Neurology

## 2023-01-08 ENCOUNTER — Other Ambulatory Visit: Payer: Self-pay | Admitting: Adult Health

## 2023-01-16 ENCOUNTER — Encounter: Payer: Self-pay | Admitting: Adult Health

## 2023-01-18 ENCOUNTER — Telehealth: Payer: Self-pay | Admitting: *Deleted

## 2023-01-18 NOTE — Telephone Encounter (Signed)
Completed Nurtec PA on CMM. [id number]. Awaiting determination from [facility].

## 2023-01-29 ENCOUNTER — Other Ambulatory Visit: Payer: Self-pay | Admitting: Neurology

## 2023-04-11 ENCOUNTER — Other Ambulatory Visit: Payer: Self-pay | Admitting: Family Medicine

## 2023-04-11 MED ORDER — ALBUTEROL SULFATE HFA 108 (90 BASE) MCG/ACT IN AERS: 2.0000 | INHALATION_SPRAY | RESPIRATORY_TRACT | 3 refills | Status: DC | PRN

## 2023-05-07 ENCOUNTER — Other Ambulatory Visit: Payer: Self-pay | Admitting: Sports Medicine

## 2023-05-07 ENCOUNTER — Ambulatory Visit
Admission: RE | Admit: 2023-05-07 | Discharge: 2023-05-07 | Disposition: A | Discharge status: Home / Self Care | Payer: BLUE CROSS/BLUE SHIELD | Source: Ambulatory Visit | Attending: Sports Medicine | Admitting: Sports Medicine

## 2023-05-07 DIAGNOSIS — G8929 Other chronic pain: Secondary | ICD-10-CM

## 2023-05-16 ENCOUNTER — Other Ambulatory Visit: Payer: Self-pay | Admitting: Allergy & Immunology

## 2023-06-29 ENCOUNTER — Other Ambulatory Visit: Payer: Self-pay | Admitting: Adult Health

## 2023-06-29 ENCOUNTER — Other Ambulatory Visit: Payer: Self-pay | Admitting: Neurology

## 2023-07-12 ENCOUNTER — Other Ambulatory Visit: Payer: Self-pay | Admitting: *Deleted

## 2023-07-12 MED ORDER — EMGALITY 120 MG/ML ~~LOC~~ SOAJ: 120.0000 mg | SUBCUTANEOUS | 0 refills | Status: AC

## 2023-08-10 ENCOUNTER — Other Ambulatory Visit: Payer: Self-pay | Admitting: Family Medicine

## 2023-08-13 ENCOUNTER — Other Ambulatory Visit: Payer: Self-pay | Admitting: Neurology

## 2023-08-14 ENCOUNTER — Telehealth: Payer: Self-pay | Admitting: Podiatry

## 2023-08-14 NOTE — Telephone Encounter (Signed)
Dr. Barrie Dunker [facility] office called and asked if the patient is cleared from taking antibiotics that she was prescribed back in 2021. They would like a letter saying she is not currently raking any medication that we have prescribed her. Their fax number is [phone]

## 2023-08-15 ENCOUNTER — Encounter: Payer: Self-pay | Admitting: Podiatry

## 2023-08-15 NOTE — Telephone Encounter (Signed)
Fine to send one line response that she is fine to have dental surgery

## 2023-08-15 NOTE — Telephone Encounter (Signed)
Dr. Barrie Dunker dental office called again to send a letter saying that she has clearance to have dental work done. Fax number is [phone].

## 2023-08-15 NOTE — Telephone Encounter (Signed)
Letter printed out and sent to fax number [phone].

## 2023-08-21 ENCOUNTER — Other Ambulatory Visit: Payer: Self-pay | Admitting: Otolaryngology

## 2023-08-24 LAB — SURGICAL PATHOLOGY

## 2023-09-06 ENCOUNTER — Telehealth: Payer: Self-pay

## 2023-09-06 ENCOUNTER — Other Ambulatory Visit (HOSPITAL_COMMUNITY): Payer: Self-pay

## 2023-09-06 NOTE — Telephone Encounter (Signed)
*  [facility]  Pharmacy Patient Advocate Encounter  Received notification from [facility] that Prior Authorization for Emgality 120MG /ML auto-injectors (migraine)  has been APPROVED from 09/06/2023 to 09/05/2024. Ran test claim, Copay is $refill too soon. This test claim was processed through [facility]- copay amounts may vary at other pharmacies due to pharmacy/plan contracts, or as the patient moves through the different stages of their insurance plan.   PA #/Case ID/Reference #: Rowe Clack

## 2023-09-21 ENCOUNTER — Telehealth (INDEPENDENT_AMBULATORY_CARE_PROVIDER_SITE_OTHER): Payer: BLUE CROSS/BLUE SHIELD | Admitting: Adult Health

## 2023-09-21 DIAGNOSIS — G43709 Chronic migraine without aura, not intractable, without status migrainosus: Secondary | ICD-10-CM

## 2023-09-21 DIAGNOSIS — G47411 Narcolepsy with cataplexy: Secondary | ICD-10-CM

## 2023-09-21 NOTE — Progress Notes (Signed)
PATIENT: Debra Nelson DOB: 05-29-1981  REASON FOR VISIT: follow up HISTORY FROM: patient  Virtual Visit via Video Note  I connected with Debra Nelson on 09/21/23 at  9:15 AM EDT by a video enabled telemedicine application located remotely at [facility] and verified that I am speaking with the correct person using two identifiers who was located at their own home.   I discussed the limitations of evaluation and management by telemedicine and the availability of in person appointments. The patient expressed understanding and agreed to proceed.   PATIENT: Debra Nelson DOB: November 21, 1980  REASON FOR VISIT: follow up HISTORY FROM: patient  HISTORY OF PRESENT ILLNESS: Today 09/21/23:  Debra Nelson is a 42 y.o. female with a history of migraine headaches and narcolepsy. Returns today for follow-up.   Migraines: Migraines are rare now. Headaches depend on whether she sleeps bad- maybe once a week she will wake up with a headache.  Continues to take Nurtec for abortive therapy and it works well  Narcolepsy: Remains on armodafinil 250 mg daily denies any significant daytime sleepiness or fatigue.  Denies cataplectic events   10/02/22:Debra Nelson is a 42 year old female with a history of migraine headaches, narcolepsy and restless leg syndrome.  She returns today for follow-up.  Narcolepsy: Continues on armodafinil 250 mg daily reports that this is working well for her.  Denies any significant daytime sleepiness or fatigue  Migraines: Headaches under good control.  Around her menstrual cycle she will get a couple of headaches. Severity has improved with Emgality.  Nurtec works well for abortive therapy  Restless leg syndrome: No longer on Requip and is now on ketamine prescribed by another provider.  She does report that she will be having nasal surgery tomorrow.  HISTORY 08/02/21:   Debra Nelson is a 42 year old female with a history of migraine  headaches, narcolepsy and restless legs.  She returns today for follow-up.  She reports that Emgality continues to work well for her headaches.  She has noticed significant decrease in the frequency.  She is currently on armodafinil 250 mg daily.  She states that she was also given 150 mg prescription but had to have this filled at another pharmacy so insurance would give it to her?  She states that she was taking the 250 mg in the 150 mg.  I advised the patient that I do not see this in Dr. Oliva Bustard note.  Patient is currently taking Requip 0.5 mg at bedtime this has been prescribed by another provider.  She reports that it works well for her.  She returns today for an evaluation  REVIEW OF SYSTEMS: Out of a complete 14 system review of symptoms, the patient complains only of the following symptoms, and all other reviewed systems are negative.  ALLERGIES: Allergies  Allergen Reactions   Klonopin [Clonazepam] Nausea Only   Promethazine Hcl Other (See Comments)    IV causes muscle tics, may take oral, or give benadryl prior to IV   Topamax [Topiramate] Nausea And Vomiting    confusion    HOME MEDICATIONS: Outpatient Medications Prior to Visit  Medication Sig Dispense Refill   acetaminophen (TYLENOL) 500 MG tablet Take 1,000 mg by mouth every 6 (six) hours as needed for moderate pain.     albuterol (VENTOLIN HFA) 108 (90 Base) MCG/ACT inhaler Inhale 2 puffs into the lungs every 4 (four) hours as needed for shortness of breath or wheezing. 18 g 3   ALPRAZolam Prudy Feeler)  0.5 MG tablet Take 0.25 mg by mouth daily as needed for anxiety.     Armodafinil 250 MG tablet TAKE 1 TABLET BY MOUTH EVERY DAY IN THE MORNING (Patient taking differently: Take 250 mg by mouth daily as needed (staying awake).) 30 tablet 5   BREZTRI AEROSPHERE 160-9-4.8 MCG/ACT AERO Inhale 2 puffs into the lungs in the morning and at bedtime. (Patient not taking: Reported on 10/24/2022) 10.7 g 5   budesonide (PULMICORT) 0.5 MG/2ML  nebulizer solution Take 0.5 mg by nebulization daily as needed (shortness of breath).     Carboxymethylcellul-Glycerin (LUBRICATING EYE DROPS OP) Place 1 Application into both eyes daily as needed (dry eyes).     cetirizine (ZYRTEC ALLERGY) 10 MG tablet Take 1 tablet (10 mg total) by mouth daily. 30 tablet 5   Galcanezumab-gnlm (EMGALITY) 120 MG/ML SOAJ Inject 120 mg into the skin every 30 (thirty) days. 3 mL 0   ibuprofen (ADVIL) 600 MG tablet Take 1 tablet (600 mg total) by mouth every 6 (six) hours as needed. 30 tablet 0   lamoTRIgine (LAMICTAL) 200 MG tablet Take 400 mg by mouth at bedtime.     Melatonin 5 MG CAPS Take 5 mg by mouth at bedtime.     Multiple Vitamins-Minerals (HAIR SKIN AND NAILS FORMULA PO) Take 1 tablet by mouth daily.      Multiple Vitamins-Minerals (MULTI-VITAMIN MENOPAUSAL) TABS Take 1 tablet by mouth daily.     nitrofurantoin, macrocrystal-monohydrate, (MACROBID) 100 MG capsule Take 100 mg by mouth 2 (two) times daily.     ondansetron (ZOFRAN-ODT) 4 MG disintegrating tablet TAKE 2 TABLETS BY MOUTH EVERY 8 HOURS AS NEEDED FOR NAUSEA AND VOMITING 8 tablet 4   OVER THE COUNTER MEDICATION Take 2 tablets by mouth 2 (two) times daily. Magnesium L-theonate     pantoprazole (PROTONIX) 40 MG tablet TAKE 1 TABLET BY MOUTH EVERY DAY 90 tablet 3   phenazopyridine (PYRIDIUM) 200 MG tablet Take 200 mg by mouth 3 (three) times daily as needed for pain.     Probiotic Product (PROBIOTIC DAILY PO) Take 1 tablet by mouth daily. Take 1 tablet daily for one month then stop for one month then restart     promethazine (PHENERGAN) 12.5 MG tablet TAKE 1 TABLET BY MOUTH EVERY 6 HOURS AS NEEDED FOR NAUSEA OR VOMITING. 30 tablet 2   Rimegepant Sulfate (NURTEC) 75 MG TBDP TAKE 1 TABLET BY MOUTH DAILY AS NEEDED. AS CLOSE TO ONSET OF MIGRAINE AS POSSIBLE. **MAX 1 TAB/DAY** 8 tablet 5   triamcinolone (NASACORT) 55 MCG/ACT AERO nasal inhaler Place 1 spray into the nose 2 (two) times daily. (Patient not  taking: Reported on 10/24/2022) 1 each 5   UNABLE TO FIND Take 50 mg by mouth 3 (three) times daily. Med Name: Ketamine     No facility-administered medications prior to visit.    PAST MEDICAL HISTORY: Past Medical History:  Diagnosis Date   Allergy    Anemia    low normal- Iron once a week    Anxiety    Arthritis    Asthma    Bipolar disorder with depression (HCC)    and PTSD   Chicken pox    Chronic migraine w/o aura, not intractable, w stat migr    Depression    DJD (degenerative joint disease)    Severe of the right knee-Dr Creighton-ortho and Dr Rockne Coons, PA-preferred pain managment   Eustachian tube disorder, bilateral    Hearing loss and vertigo   GERD (gastroesophageal reflux  disease)    Narcolepsy    Neuromuscular disorder (HCC)    Fibromyalgia   PTSD (post-traumatic stress disorder)    PVC (premature ventricular contraction)    Rosacea    Seasonal allergic rhinitis     PAST SURGICAL HISTORY: Past Surgical History:  Procedure Laterality Date   DILITATION & CURRETTAGE/HYSTROSCOPY WITH HYDROTHERMAL ABLATION N/A 10/31/2022   Procedure: DILATATION & CURETTAGE/HYSTEROSCOPY WITH HYDROTHERMAL ABLATION;  Surgeon: Myna Hidalgo, DO;  Location: [facility];  Service: Gynecology;  Laterality: N/A;   knee surgery x5     08,11, 12, 13 x2- R knee   NASAL TURBINATE REDUCTION Bilateral 06/2020   right foot surgery     x 3, arthritis/bone spurus. plate placed.    WISDOM TOOTH EXTRACTION      FAMILY HISTORY: Family History  Problem Relation Age of Onset   Mental illness Mother        does not speak to regularly so does not know full history   Migraines Mother    Other Father        medical issues, divorce related   Other Brother        ptsd- fully discharged medically disabled before age 68   Heart murmur Brother    Headache Brother        cluster   Cancer Paternal Grandfather        type unknown   Migraines Other    Colon cancer Neg Hx    Colon polyps Neg Hx     Esophageal cancer Neg Hx    Rectal cancer Neg Hx    Stomach cancer Neg Hx     SOCIAL HISTORY: Social History   Socioeconomic History   Marital status: Married    Spouse name: [name]   Number of children: 3   Years of education: College   Highest education level: Not on file  Occupational History    Employer: OTHER  Tobacco Use   Smoking status: Never   Smokeless tobacco: Never  Vaping Use   Vaping status: Never Used  Substance and Sexual Activity   Alcohol use: No   Drug use: No   Sexual activity: Yes    Partners: Male    Birth control/protection: None, Surgical    Comment: vasectomy  Other Topics Concern   Not on file  Social History Narrative   Patient is married ([name]) and lives at home with her family   4 adults - 2 kids 50% of the time (1 son, 1 daughter). 2 older children (one age 78 from rape, then 78 year old- does not get to see- related to bipolar) Moved in with in laws to help them      Patient has 4 children (3 of her own and 1 step son).    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   Social Determinants of Health   Financial Resource Strain: Medium Risk (11/09/2021)   Overall Financial Resource Strain (CARDIA)    Difficulty of Paying Living Expenses: Somewhat hard  Food Insecurity: Food Insecurity Present (11/09/2021)   Hunger Vital Sign    Worried About Running Out of Food in the Last Year: Sometimes true    Ran Out of Food in the Last Year: Never true  Transportation Needs: No Transportation Needs (11/09/2021)   PRAPARE -  Administrator, Civil Service (Medical): No    Lack of Transportation (Non-Medical): No  Physical Activity: Inactive (11/09/2021)   Exercise Vital Sign    Days of Exercise per Week: 0 days    Minutes of Exercise per Session: 0 min  Stress: Stress Concern Present  (11/09/2021)   [employer] of Occupational Health - Occupational Stress Questionnaire    Feeling of Stress : To some extent  Social Connections: Socially Integrated (11/09/2021)   Social Connection and Isolation Panel [NHANES]    Frequency of Communication with Friends and Family: More than three times a week    Frequency of Social Gatherings with Friends and Family: Not on file    Attends Religious Services: 1 to 4 times per year    Active Member of [employer] or Organizations: Yes    Attends Banker Meetings: More than 4 times per year    Marital Status: Married  Catering manager Violence: Not At Risk (11/09/2021)   Humiliation, Afraid, Rape, and Kick questionnaire    Fear of Current or Ex-Partner: No    Emotionally Abused: No    Physically Abused: No    Sexually Abused: No      PHYSICAL EXAM Generalized: Well developed, in no acute distress   Neurological examination  Mentation: Alert oriented to time, place, history taking. Follows all commands speech and language fluent Cranial nerve II-XII: Facial symmetry noted.    DIAGNOSTIC DATA (LABS, IMAGING, TESTING) - I reviewed patient records, labs, notes, testing and imaging myself where available.  Lab Results  Component Value Date   WBC 6.2 10/27/2022   HGB 13.3 10/27/2022   HCT 39.4 10/27/2022   MCV 93.8 10/27/2022   PLT 355 10/27/2022      Component Value Date/Time   NA 138 10/27/2022 0927   NA 138 04/16/2020 1145   K 3.3 (L) 10/27/2022 0927   CL 106 10/27/2022 0927   CO2 25 10/27/2022 0927   GLUCOSE 93 10/27/2022 0927   BUN 9 10/27/2022 0927   BUN 11 04/16/2020 1145   CREATININE 0.68 10/27/2022 0927   CREATININE 0.77 01/14/2021 1647   CALCIUM 8.9 10/27/2022 0927   PROT 6.9 03/01/2022 0839   PROT 7.2 07/10/2019 1008   ALBUMIN 4.8 03/01/2022 0839   ALBUMIN 5.2 (H) 07/10/2019 1008   AST 19 03/01/2022 0839   ALT 18 03/01/2022 0839   ALKPHOS 125 (H) 03/01/2022 0839   BILITOT 0.5 03/01/2022 0839    BILITOT 0.7 07/10/2019 1008   GFRNONAA >60 10/27/2022 0927   GFRNONAA 96 01/14/2021 1647   GFRAA 111 01/14/2021 1647   Lab Results  Component Value Date   CHOL 229 (H) 03/01/2022   HDL 47.70 03/01/2022   LDLCALC 152 (H) 03/01/2022   TRIG 146.0 03/01/2022   CHOLHDL 5 03/01/2022   Lab Results  Component Value Date   HGBA1C 4.9 01/30/2018   Lab Results  Component Value Date   VITAMINB12 396 03/01/2022   Lab Results  Component Value Date   TSH 1.91 03/01/2022      ASSESSMENT AND PLAN 42 y.o. year old female  has a past medical history of Allergy, Anemia, Anxiety, Arthritis, Asthma, Bipolar disorder with depression (HCC), Chicken pox, Chronic migraine w/o aura, not intractable, w stat migr, Depression, DJD (degenerative joint disease), Eustachian tube disorder, bilateral, GERD (gastroesophageal reflux disease), Narcolepsy, Neuromuscular disorder (HCC), PTSD (post-traumatic stress disorder), PVC (premature ventricular contraction), Rosacea, and Seasonal allergic rhinitis. here with:  1.  Migraine headaches   --  Continue Emgality monthly injection --Continue Nurtec for abortive therapy   2.  Narcolepsy   --Continue armodafinil 250 mg daily   Follow-up in 1 year or sooner if needed   Butch Penny, MSN, NP-C 09/21/2023, 9:09 AM [facility] [address] [place], [place] [address] [phone]

## 2024-01-02 ENCOUNTER — Telehealth: Payer: Self-pay | Admitting: Pharmacist

## 2024-01-02 NOTE — Telephone Encounter (Signed)
Pharmacy Patient Advocate Encounter  Received notification from [facility] that Prior Authorization for Nurtec 75MG  dispersible tablets has been APPROVED from 12/03/2023 to 01/01/2025   PA #/Case ID/Reference #: [id number]

## 2024-02-28 ENCOUNTER — Other Ambulatory Visit: Payer: Self-pay | Admitting: Adult Health

## 2024-04-21 ENCOUNTER — Other Ambulatory Visit: Payer: Self-pay | Admitting: Neurology

## 2024-05-21 ENCOUNTER — Other Ambulatory Visit: Payer: Self-pay | Admitting: Neurology

## 2024-08-25 ENCOUNTER — Other Ambulatory Visit: Payer: Self-pay | Admitting: Adult Health

## 2024-09-01 ENCOUNTER — Telehealth: Payer: Self-pay

## 2024-09-01 ENCOUNTER — Other Ambulatory Visit (HOSPITAL_COMMUNITY): Payer: Self-pay

## 2024-09-01 NOTE — Telephone Encounter (Signed)
 Pharmacy Patient Advocate Encounter  Received notification from [facility] that Prior Authorization for Emgality  has been APPROVED from 09/01/2024 to 09/01/2025   PA #/Case ID/Reference #: [id number]  KEY: [id number]

## 2024-09-16 ENCOUNTER — Telehealth: Payer: BLUE CROSS/BLUE SHIELD | Admitting: Adult Health

## 2024-09-16 DIAGNOSIS — G47419 Narcolepsy without cataplexy: Secondary | ICD-10-CM | POA: Diagnosis not present

## 2024-09-16 DIAGNOSIS — G43709 Chronic migraine without aura, not intractable, without status migrainosus: Secondary | ICD-10-CM | POA: Diagnosis not present

## 2024-09-16 DIAGNOSIS — G47411 Narcolepsy with cataplexy: Secondary | ICD-10-CM
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
## 2024-09-16 NOTE — Patient Instructions (Signed)
 Your Plan:  Continue Emgality  and Nurtec for your migraines Since you are off of armodafinil .  You should exercise caution when driving or operating heavy equipment.  You should never do these things if you are feeling excessively sleepy.     Thank you for coming to see us  at [facility]. I hope we have been able to provide you high quality care today.  You may receive a patient satisfaction survey over the next few weeks. We would appreciate your feedback and comments so that we may continue to improve ourselves and the health of our patients.

## 2024-10-01 NOTE — Progress Notes (Signed)
 Cautions for all patients with Narcolepsy or Hypersomnia of other origin :   Your sleep disorder  manifests with at times severe excessive sleepiness during the day and often with problems with sleep at night.  It usually refreshes those affected by narcolepsy to take  power naps and many schedule those in daytime.   We may have to try different medications that may help you stay awake during the day. Not everything works with everybody the same way.  Wake promoting agents include stimulants and non-stimulant type medications.  The most common side effects with stimulants are weight loss, insomnia, nervousness, headaches, palpitations, rise in blood pressure, anxiety.  Stimulants can be addictive and subject to abuse.  Non-stimulant type wake promoting medications include Provigil  and Nuvigil , most common side effects include headaches, nervousness, insomnia, hypertension. In addition there is sodium oxybate , Sunosi and Wakix.   Fifty percent of  patients with Narcolepsy have Cataplexy as well : they  report episodes of weakness, such as jaw or facial weakness, legs giving out, feeling wobbly or like Jell-o, etc. in situations of anxiety, stress, laughter, sudden sadness, surprise, etc., Some people experience dreamlike sequences upon awakening or upon drifting off to sleep, called hypnopompic or hypnagogic hallucinations.   If you are too sleepy to drive safely, to operate machinery safely, please do not drive.   Dedra Gores, MD

## 2024-10-08 ENCOUNTER — Other Ambulatory Visit: Payer: Self-pay | Admitting: Adult Health

## 2024-10-24 ENCOUNTER — Other Ambulatory Visit: Payer: Self-pay | Admitting: Family Medicine

## 2024-12-10 ENCOUNTER — Other Ambulatory Visit: Payer: Self-pay | Admitting: Adult Health

## 2024-12-17 ENCOUNTER — Encounter: Payer: Self-pay | Admitting: Family Medicine

## 2024-12-17 ENCOUNTER — Other Ambulatory Visit: Payer: Self-pay | Admitting: Neurology

## 2024-12-18 ENCOUNTER — Other Ambulatory Visit: Payer: Self-pay

## 2024-12-18 MED ORDER — ALBUTEROL SULFATE HFA 108 (90 BASE) MCG/ACT IN AERS: 2.0000 | INHALATION_SPRAY | RESPIRATORY_TRACT | 0 refills | Status: AC | PRN

## 2024-12-19 ENCOUNTER — Encounter: Payer: Self-pay | Admitting: Family Medicine

## 2024-12-19 ENCOUNTER — Ambulatory Visit: Admitting: Family Medicine

## 2024-12-19 VITALS — BP 112/68 | HR 87 | Temp 98.1°F | Ht 61.0 in | Wt 154.8 lb

## 2024-12-19 DIAGNOSIS — D509 Iron deficiency anemia, unspecified: Secondary | ICD-10-CM | POA: Diagnosis not present

## 2024-12-19 DIAGNOSIS — E663 Overweight: Secondary | ICD-10-CM

## 2024-12-19 DIAGNOSIS — Z13220 Encounter for screening for lipoid disorders: Secondary | ICD-10-CM

## 2024-12-19 DIAGNOSIS — Z79899 Other long term (current) drug therapy: Secondary | ICD-10-CM | POA: Diagnosis not present

## 2024-12-19 DIAGNOSIS — B9689 Other specified bacterial agents as the cause of diseases classified elsewhere: Secondary | ICD-10-CM | POA: Diagnosis not present

## 2024-12-19 DIAGNOSIS — J329 Chronic sinusitis, unspecified: Secondary | ICD-10-CM

## 2024-12-19 DIAGNOSIS — J4541 Moderate persistent asthma with (acute) exacerbation: Secondary | ICD-10-CM

## 2024-12-19 DIAGNOSIS — Z131 Encounter for screening for diabetes mellitus: Secondary | ICD-10-CM | POA: Diagnosis not present

## 2024-12-19 DIAGNOSIS — E559 Vitamin D deficiency, unspecified: Secondary | ICD-10-CM | POA: Diagnosis not present

## 2024-12-19 DIAGNOSIS — Z0001 Encounter for general adult medical examination with abnormal findings: Secondary | ICD-10-CM | POA: Diagnosis not present

## 2024-12-19 MED ORDER — DOXYCYCLINE HYCLATE 100 MG PO TABS: 100.0000 mg | ORAL_TABLET | Freq: Two times a day (BID) | ORAL | 0 refills | Status: AC

## 2024-12-19 MED ORDER — BREZTRI AEROSPHERE 160-9-4.8 MCG/ACT IN AERO: 2.0000 | INHALATION_SPRAY | Freq: Two times a day (BID) | RESPIRATORY_TRACT | 5 refills | Status: AC

## 2024-12-19 MED ORDER — PREDNISONE 20 MG PO TABS: ORAL_TABLET | ORAL | 0 refills | Status: AC

## 2024-12-19 NOTE — Progress Notes (Signed)
 " Phone [phone] In person visit   Subjective:   Debra Nelson is a 44 y.o. year old very pleasant female patient who presents for/with See problem oriented charting  Past Medical History-  Patient Active Problem List   Diagnosis Date Noted   Fibromyalgia 08/09/2020    Priority: High   Chronic post-traumatic stress disorder (PTSD) 02/26/2017    Priority: High   Narcolepsy and cataplexy 03/06/2014    Priority: High   Bipolar II disorder (HCC) 12/31/2013    Priority: High   Symptomatic PVCs 02/26/2020    Priority: Medium    Asthma 10/27/2016    Priority: Medium    Gastroesophageal reflux disease 10/27/2016    Priority: Medium    Migraine with aura and without status migrainosus, not intractable 10/19/2015    Priority: Medium    Recurrent isolated sleep paralysis 07/02/2018    Priority: Low   Menstrual migraine without status migrainosus, not intractable 01/02/2018    Priority: Low   Chronic thumb pain, bilateral 11/23/2017    Priority: Low   Elevated TSH 05/07/2017    Priority: Low   Trigger thumb, left thumb 03/15/2017    Priority: Low   Sensorineural hearing loss (SNHL) of both ears 12/12/2016    Priority: Low   Seasonal allergic rhinitis 05/02/2016    Priority: Low   Right ear pain 05/02/2016    Priority: Low   Dysfunction of both eustachian tubes 05/02/2016    Priority: Low   Chondromalacia of knee 03/07/2013    Priority: Low   Abnormal uterine bleeding (AUB) 10/31/2022   Nevus of groin 05/12/2022   Hypersomnia with sleep apnea 04/03/2022   PMS (premenstrual syndrome) 04/03/2022   Excessive daytime sleepiness 04/03/2022   Bipolar disorder, current episode mixed, mild (HCC) 04/03/2022   Not well controlled severe persistent asthma (HCC) 02/14/2022   Chronic rhinitis 02/14/2022   Recurrent infections 02/14/2022   Pruritus 02/14/2022   Hymenoptera allergy 02/14/2022   PLMD (periodic limb movement disorder) 03/03/2021   Snoring 02/22/2021   Witnessed  apneic spells 02/22/2021   Bilateral hearing loss 09/24/2020   Chronic migraine w/o aura w/o status migrainosus, not intractable 08/30/2020   Primary narcolepsy without cataplexy 08/25/2020   Other headache syndrome 08/25/2020   Hypoxemia associated with sleep 08/25/2020   Acute recurrent pansinusitis 05/10/2020   Nasal turbinate hypertrophy 05/10/2020   Erroneous encounter - disregard 04/08/2020   Atypical chest pain 10/06/2019   Sinus tachycardia 08/27/2019   Shortness of breath 08/27/2019    Medications- reviewed and updated Current Outpatient Medications  Medication Sig Dispense Refill   acetaminophen  (TYLENOL ) 500 MG tablet Take 1,000 mg by mouth every 6 (six) hours as needed for moderate pain.     albuterol  (VENTOLIN  HFA) 108 (90 Base) MCG/ACT inhaler Inhale 2 puffs into the lungs every 4 (four) hours as needed for shortness of breath or wheezing. 18 g 0   ALPRAZolam (XANAX) 0.5 MG tablet Take 0.25 mg by mouth daily as needed for anxiety.     celecoxib (CELEBREX) 200 MG capsule Take 200 mg by mouth 2 (two) times daily.     fexofenadine (ALLEGRA ODT) 30 MG disintegrating tablet Take 30 mg by mouth daily.     gabapentin  (NEURONTIN ) 300 MG capsule Take 300 mg by mouth 3 (three) times daily.     Galcanezumab -gnlm (EMGALITY ) 120 MG/ML SOAJ Inject 120 mg into the skin every 30 (thirty) days. 3 mL 0   ibuprofen  (ADVIL ) 600 MG tablet Take 1 tablet (600 mg  total) by mouth every 6 (six) hours as needed. 30 tablet 0   lamoTRIgine  (LAMICTAL ) 200 MG tablet Take 400 mg by mouth at bedtime.     Melatonin 5 MG CAPS Take 5 mg by mouth at bedtime.     Multiple Vitamins-Minerals (HAIR SKIN AND NAILS FORMULA PO) Take 1 tablet by mouth daily.      Multiple Vitamins-Minerals (MULTI-VITAMIN MENOPAUSAL) TABS Take 1 tablet by mouth daily.     NURTEC 75 MG TBDP TAKE 1 TABLET BY MOUTH DAILY AS NEEDED. AS CLOSE TO ONSET OF MIGRAINE AS POSSIBLE. **MAX 1 TAB/DAY** 8 tablet 5   ondansetron  (ZOFRAN -ODT) 4 MG  disintegrating tablet TAKE 2 TABLETS BY MOUTH EVERY 8 HOURS AS NEEDED FOR NAUSEA AND VOMITING 8 tablet 4   OVER THE COUNTER MEDICATION Take 2 tablets by mouth 2 (two) times daily. Magnesium L-theonate     pantoprazole  (PROTONIX ) 40 MG tablet TAKE 1 TABLET BY MOUTH EVERY DAY 90 tablet 3   Probiotic Product (PROBIOTIC DAILY PO) Take 1 tablet by mouth daily. Take 1 tablet daily for one month then stop for one month then restart     promethazine  (PHENERGAN ) 12.5 MG tablet Take 1 tablet (12.5 mg total) by mouth every 6 (six) hours as needed. 30 tablet 0   UNABLE TO FIND Take 50 mg by mouth 3 (three) times daily. Med Name: Ketamine     Armodafinil  250 MG tablet TAKE 1 TABLET BY MOUTH EVERY DAY IN THE MORNING (Patient not taking: Reported on 12/19/2024) 30 tablet 5   BREZTRI  AEROSPHERE 160-9-4.8 MCG/ACT AERO inhaler Inhale 2 puffs into the lungs in the morning and at bedtime. 10.7 g 5   Carboxymethylcellul-Glycerin (LUBRICATING EYE DROPS OP) Place 1 Application into both eyes daily as needed (dry eyes). (Patient not taking: Reported on 12/19/2024)     doxycycline  (VIBRA -TABS) 100 MG tablet Take 1 tablet (100 mg total) by mouth 2 (two) times daily for 10 days. 20 tablet 0   predniSONE  (DELTASONE ) 20 MG tablet Take 2 pills for 3 days, 1 pill for 4 days 10 tablet 0   No current facility-administered medications for this visit.     Objective:  BP 112/68 (BP Location: Left Arm, Patient Position: Sitting, Cuff Size: Normal)   Pulse 87   Temp 98.1 F (36.7 C) (Temporal)   Ht 5' 1 (1.549 m)   Wt 154 lb 12.8 oz (70.2 kg)   SpO2 97%   BMI 29.25 kg/m  Appears fatigued  HEENT: Turbinates erythematous with yellow drainage, TM normal bilaterally but reports pain on exam, pharynx mildly erythematous with no tonsilar exudate or edema, severe frontal and maxillary sinus tenderness CV: RRR no murmurs rubs or gallops Lungs: CTAB no crackles, wheeze, rhonchi       Assessment and Plan   # Sinusitis #asthma  exacerbation S: 3-4 months of symptom(s) off and on including sinus pressure-frontal and maxillary including dental pain, post nasal drip, chunky brown phlegm. Snores heavily at night recently as well in last few days.  Bilaterally ear pain. More sneeezing.  -baseline allergies already on allegra- used to be on Flonase but no longer on that or zyrtec . No watery itchy eyes. No runny nose.  -has noted this flare her asthma up- wheezing and feels typical chest tightness. Albuterol  helps. Not on her breztri   - has been on amoxicillin  and Augmentin  on 2 separate but affected stomach heavily for over 2 weeks afterwards. Got up to 75 % better on 7 day course A/P:  sinusitis bacterial and recurrent based on ongoing sinus pressure and congestion despite 2 antibiotic(s) rounds- doesn't tolerate Augmentin  and needs prolonged course- trial doxycycline  100 mg twice daily for 10 days -also lungs are flared up/asthma flared though not severe today- restart Breztri  and tomorrow morning start prednisone  for 7 days - I want her back in office in 10 days if not improving or if symptom(s) worsen sooner  -get back in with allergy/asthma   Recommended follow up: No follow-ups on file. Future Appointments  Date Time Provider Department Center  09/15/2025  1:45 PM Sherryl Bouchard, NP [facility] None    Lab/Order associations:   ICD-10-CM   1. Bacterial sinusitis  J32.9    B96.89     2. Moderate persistent asthma with exacerbation  J45.41       Meds ordered this encounter  Medications   BREZTRI  AEROSPHERE 160-9-4.8 MCG/ACT AERO inhaler    Sig: Inhale 2 puffs into the lungs in the morning and at bedtime.    Dispense:  10.7 g    Refill:  5   doxycycline  (VIBRA -TABS) 100 MG tablet    Sig: Take 1 tablet (100 mg total) by mouth 2 (two) times daily for 10 days.    Dispense:  20 tablet    Refill:  0   predniSONE  (DELTASONE ) 20 MG tablet    Sig: Take 2 pills for 3 days, 1 pill for 4 days    Dispense:  10 tablet     Refill:  0    Return precautions advised.  Garnette Lukes, MD   "

## 2024-12-19 NOTE — Progress Notes (Signed)
 " Phone [phone]   Subjective:  Patient presents today for their annual physical. Chief complaint-noted.   See problem oriented charting- ROS- full  review of systems was completed and negative except for topics noted under acute/chronic concerns   The following were reviewed and entered/updated in epic: Past Medical History:  Diagnosis Date   Allergy    Anemia    low normal- Iron once a week    Anxiety    Arthritis    Asthma    Bipolar disorder with depression (HCC)    and PTSD   Chicken pox    Chronic migraine w/o aura, not intractable, w stat migr    Depression    DJD (degenerative joint disease)    Severe of the right knee-Dr Creighton-ortho and Dr Kimble, PA-preferred pain managment   Eustachian tube disorder, bilateral    Hearing loss and vertigo   GERD (gastroesophageal reflux disease)    Narcolepsy    Neuromuscular disorder (HCC)    Fibromyalgia   PTSD (post-traumatic stress disorder)    PVC (premature ventricular contraction)    Rosacea    Seasonal allergic rhinitis    Patient Active Problem List   Diagnosis Date Noted   Fibromyalgia 08/09/2020    Priority: High   Chronic post-traumatic stress disorder (PTSD) 02/26/2017    Priority: High   Narcolepsy and cataplexy 03/06/2014    Priority: High   Bipolar II disorder (HCC) 12/31/2013    Priority: High   Symptomatic PVCs 02/26/2020    Priority: Medium    Asthma 10/27/2016    Priority: Medium    Gastroesophageal reflux disease 10/27/2016    Priority: Medium    Migraine with aura and without status migrainosus, not intractable 10/19/2015    Priority: Medium    Recurrent isolated sleep paralysis 07/02/2018    Priority: Low   Menstrual migraine without status migrainosus, not intractable 01/02/2018    Priority: Low   Chronic thumb pain, bilateral 11/23/2017    Priority: Low   Elevated TSH 05/07/2017    Priority: Low   Trigger thumb, left thumb 03/15/2017    Priority: Low   Sensorineural  hearing loss (SNHL) of both ears 12/12/2016    Priority: Low   Seasonal allergic rhinitis 05/02/2016    Priority: Low   Right ear pain 05/02/2016    Priority: Low   Dysfunction of both eustachian tubes 05/02/2016    Priority: Low   Chondromalacia of knee 03/07/2013    Priority: Low   Abnormal uterine bleeding (AUB) 10/31/2022   Nevus of groin 05/12/2022   Hypersomnia with sleep apnea 04/03/2022   PMS (premenstrual syndrome) 04/03/2022   Excessive daytime sleepiness 04/03/2022   Bipolar disorder, current episode mixed, mild (HCC) 04/03/2022   Not well controlled severe persistent asthma (HCC) 02/14/2022   Chronic rhinitis 02/14/2022   Recurrent infections 02/14/2022   Pruritus 02/14/2022   Hymenoptera allergy 02/14/2022   PLMD (periodic limb movement disorder) 03/03/2021   Snoring 02/22/2021   Witnessed apneic spells 02/22/2021   Bilateral hearing loss 09/24/2020   Chronic migraine w/o aura w/o status migrainosus, not intractable 08/30/2020   Primary narcolepsy without cataplexy 08/25/2020   Other headache syndrome 08/25/2020   Hypoxemia associated with sleep 08/25/2020   Acute recurrent pansinusitis 05/10/2020   Nasal turbinate hypertrophy 05/10/2020   Erroneous encounter - disregard 04/08/2020   Atypical chest pain 10/06/2019   Sinus tachycardia 08/27/2019   Shortness of breath 08/27/2019   Past Surgical History:  Procedure Laterality Date   DILITATION &  CURRETTAGE/HYSTROSCOPY WITH HYDROTHERMAL ABLATION N/A 10/31/2022   Procedure: DILATATION & CURETTAGE/HYSTEROSCOPY WITH HYDROTHERMAL ABLATION;  Surgeon: Ozan, Jennifer, DO;  Location: [facility];  Service: Gynecology;  Laterality: N/A;   knee surgery x5     08,11, 12, 13 x2- R knee   NASAL TURBINATE REDUCTION Bilateral 06/2020   right foot surgery     x 3, arthritis/bone spurus. plate placed.    WISDOM TOOTH EXTRACTION      Family History  Problem Relation Age of Onset   Mental illness Mother        does not speak to  regularly so does not know full history   Migraines Mother    Other Father        medical issues, divorce related   Other Brother        ptsd- fully discharged medically disabled before age 13   Heart murmur Brother    Headache Brother        cluster   Cancer Paternal Grandfather        type unknown   Migraines Other    Colon cancer Neg Hx    Colon polyps Neg Hx    Esophageal cancer Neg Hx    Rectal cancer Neg Hx    Stomach cancer Neg Hx     Medications- reviewed and updated Current Outpatient Medications  Medication Sig Dispense Refill   acetaminophen  (TYLENOL ) 500 MG tablet Take 1,000 mg by mouth every 6 (six) hours as needed for moderate pain.     albuterol  (VENTOLIN  HFA) 108 (90 Base) MCG/ACT inhaler Inhale 2 puffs into the lungs every 4 (four) hours as needed for shortness of breath or wheezing. 18 g 0   ALPRAZolam (XANAX) 0.5 MG tablet Take 0.25 mg by mouth daily as needed for anxiety.     celecoxib (CELEBREX) 200 MG capsule Take 200 mg by mouth 2 (two) times daily.     doxycycline  (VIBRA -TABS) 100 MG tablet Take 1 tablet (100 mg total) by mouth 2 (two) times daily for 10 days. 20 tablet 0   fexofenadine (ALLEGRA ODT) 30 MG disintegrating tablet Take 30 mg by mouth daily.     gabapentin  (NEURONTIN ) 300 MG capsule Take 300 mg by mouth 3 (three) times daily.     Galcanezumab -gnlm (EMGALITY ) 120 MG/ML SOAJ Inject 120 mg into the skin every 30 (thirty) days. 3 mL 0   ibuprofen  (ADVIL ) 600 MG tablet Take 1 tablet (600 mg total) by mouth every 6 (six) hours as needed. 30 tablet 0   ketamine (KETALAR) 50 MG/ML injection Inject 50 mg into the muscle once. Compounded and oral     lamoTRIgine  (LAMICTAL ) 200 MG tablet Take 400 mg by mouth at bedtime.     Melatonin 5 MG CAPS Take 5 mg by mouth at bedtime.     Multiple Vitamins-Minerals (HAIR SKIN AND NAILS FORMULA PO) Take 1 tablet by mouth daily.      Multiple Vitamins-Minerals (MULTI-VITAMIN MENOPAUSAL) TABS Take 1 tablet by mouth  daily.     NURTEC 75 MG TBDP TAKE 1 TABLET BY MOUTH DAILY AS NEEDED. AS CLOSE TO ONSET OF MIGRAINE AS POSSIBLE. **MAX 1 TAB/DAY** 8 tablet 5   ondansetron  (ZOFRAN -ODT) 4 MG disintegrating tablet TAKE 2 TABLETS BY MOUTH EVERY 8 HOURS AS NEEDED FOR NAUSEA AND VOMITING 8 tablet 4   OVER THE COUNTER MEDICATION Take 2 tablets by mouth 2 (two) times daily. Magnesium L-theonate     pantoprazole  (PROTONIX ) 40 MG tablet TAKE 1 TABLET BY MOUTH  EVERY DAY 90 tablet 3   predniSONE  (DELTASONE ) 20 MG tablet Take 2 pills for 3 days, 1 pill for 4 days 10 tablet 0   Probiotic Product (PROBIOTIC DAILY PO) Take 1 tablet by mouth daily. Take 1 tablet daily for one month then stop for one month then restart     promethazine  (PHENERGAN ) 12.5 MG tablet Take 1 tablet (12.5 mg total) by mouth every 6 (six) hours as needed. 30 tablet 0   traZODone (DESYREL) 50 MG tablet Take 50-150 mg by mouth at bedtime.     BREZTRI  AEROSPHERE 160-9-4.8 MCG/ACT AERO inhaler Inhale 2 puffs into the lungs in the morning and at bedtime. 10.7 g 5   No current facility-administered medications for this visit.    Allergies-reviewed and updated Allergies[1]  Social History   Social History Narrative   Patient is married Investment Banker, [employer]) and lives at home with her family   4 adults - 2 kids 50% of the time (1 son, 1 daughter). 2 older children (one age 47 from rape, then 79 year old- does not get to see- related to bipolar) Moved in with in laws to help them      Patient has 4 children (3 of her own and 1 step son).    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   Objective  Objective:  BP 112/68 (BP Location: Left Arm, Patient Position: Sitting, Cuff Size: Normal)   Pulse 87   Temp 98.1 F (36.7 C) (Temporal)   Ht 5' 1 (1.549 m)   Wt 154 lb 12.8 oz (70.2 kg)   SpO2 97%   BMI 29.25 kg/m  Gen:  fatigued appearing HEENT: Mucous membranes are moist. See sinusitis note Neck: no thyromegaly CV: RRR no murmurs rubs or gallops Lungs: CTAB no crackles, wheeze, rhonchi Abdomen: soft/nontender/nondistended/normal bowel sounds. No rebound or guarding.  Ext: no edema Skin: warm, dry Neuro: grossly normal, moves all extremities, PERRLA   Assessment and Plan   44 y.o. female presenting for annual physical.  Health Maintenance counseling: 1. Anticipatory guidance: Patient counseled regarding regular dental exams -q6 months, eye exams - yearly,  avoiding smoking and second hand smoke , limiting alcohol to 1 beverage per day- none , no illicit drugs .   2. Risk factor reduction:  Advised patient of need for regular exercise and diet rich and fruits and vegetables to reduce risk of heart attack and stroke.  Exercise- down since surgery and prior to surgery none due to pain- hoping to get foot in better position to restart Diet/weight management-down 15 lbs from last visit- has tried to reduce intake- has been helpful. Increased wtaer intake Wt Readings from Last 3 Encounters:  12/19/24 154 lb 12.8 oz (70.2 kg)  10/31/22 152 lb 1.9 oz (69 kg)  10/27/22 152 lb 1.9 oz (69 kg)  3. Immunizations/screenings/ancillary studies- opts out COVID otherwise up to date  Immunization History  Administered Date(s) Administered   Fluzone Influenza virus vaccine,trivalent (IIV3), split virus 08/13/2024   INFLUENZA, HIGH DOSE SEASONAL PF 09/26/2016, 01/29/2020, 09/27/2020, 08/22/2021   Influenza Inj Mdck Quad Pf 09/12/2022   Influenza, Seasonal, Injecte, Preservative Fre 09/06/2023   Influenza,inj,Quad PF,6+ Mos 09/11/2017, 08/25/2019   Influenza-Unspecified 10/04/2016, 09/11/2017, 09/01/2021   PFIZER(Purple Top)SARS-COV-2 Vaccination 02/11/2020, 03/03/2020, 03/31/2020, 09/27/2020   PNEUMOCOCCAL CONJUGATE-20  09/12/2022   Pfizer(Comirnaty)Fall Seasonal Vaccine 12 years and older 09/26/2022   Tdap 05/07/2017,  09/12/2022   Varicella 09/12/2017   Zoster Recombinant(Shingrix) 09/12/2017   4. Cervical cancer screening-11/09/21 and human papilloma virus negative so good for 5 years but encouraged follow up - reports due- had ablation but needs visit 5. Breast cancer screening-  breast exam with gyn and mammogram - 2023- due for mammogram encouraged 6. Colon cancer screening - she's not ready for this with surgery this year and upcoming travel- she will reach out when ready- not ready now but encouraged 7. Skin cancer screening- sees dermatology  for rosacea- dermatology specialists. 8. Birth control/STD check- only active with husband. Husband with vasectomy 9. Osteoporosis screening at 40- consider at later date -Never smoker  Status of chronic or acute concerns   #sinusitis and asthma- see separate note  #GERD S: Medication: Protonix  40 mg daily- sometimes can cut in half and still works. If flare does full tablet for a week or two and works A/P: reasonable control continue current medications    % Migraines-managed by neurology Dr. Chalice S: Medication: Nurtec 75 mg as needed as well as phenergan  if needed A/P: working with neurology and doing well with just as needed Nurtec now- reports just had annual   % Narcolepsy and cataplexy syndrome-follows with Dr. Chalice S: medication: armodafinil  150 mg daily in past but has done well lately- but unfortunately could trigger hypomania so pulled off A/P: thankfully doing better- still advised to be more cautious with driving- since sleep has been better this has improved   % Bipolar III disorder/PTSD/anxiety managed by Dr. Leila previously now with a psychiatrist in [place] S: Medication: lamictal  400mg , gabapentin  300 mg 3 times a day- mainly at night, xanax sparingly, trazodone very helpful for sleep. Also on ketamine- helps bipolar and fibromyalgia symptom(s) set up through psychiatry A/P: reports reasonable control lately- doing well  continue to monitor and follow up with psychiatry on current meds  % Asthma and allergies- managed by allergist in past- see separate note- advised to get back in  % MSK concerns- follows with Dr. Marquette. On Celebrex after foot surgery for bone spurs  % PVCs- managed by Dr. Henrine now with Dr. Kate RAMAN: Medication: IN PAST Metoprolol  25 mg twice daily  in past now reduced caffeine and dietary changes and improved and off meds A/P: no recent issues- continue to monitor  as long as avoids caffine  Recommended follow up: Return in about 6 months (around 06/18/2025) for followup or sooner if needed.Schedule b4 you leave. Future Appointments  Date Time Provider Department Center  05/19/2025  3:00 PM Katrinka Garnette KIDD, MD [facility]  09/15/2025  1:45 PM Sherryl Bouchard, NP [facility] None  12/21/2025  3:20 PM Katrinka Garnette KIDD, MD [facility]    Lab/Order associations:NOT fasting   ICD-10-CM   1. Encounter for general adult medical examination with abnormal findings  Z00.01     2. Bacterial sinusitis  J32.9 Comprehensive metabolic panel with GFR   B96.89 Comprehensive metabolic panel with GFR    3. Moderate persistent asthma with exacerbation  J45.41 Comprehensive metabolic panel with GFR    Comprehensive metabolic panel with GFR    4. Iron deficiency anemia, unspecified iron deficiency anemia type  D50.9 CBC with Differential/Platelet    Iron, TIBC and Ferritin Panel    Iron, TIBC and Ferritin Panel    CBC with Differential/Platelet    5. Vitamin D  deficiency  E55.9  VITAMIN D  25 Hydroxy (Vit-D Deficiency, Fractures)    VITAMIN D  25 Hydroxy (Vit-D Deficiency, Fractures)    6. High risk medication use  Z79.899 Vitamin B12    Vitamin B12    7. Screening for diabetes mellitus  Z13.1 Vitamin B12    Vitamin B12    8. Overweight  E66.3 Hemoglobin A1c    Hemoglobin A1c    9. Screening for hyperlipidemia  Z13.220 Lipid panel    Lipid panel     Return precautions  advised.  Garnette Lukes, MD      [1]  Allergies Allergen Reactions   Amoxicillin      Severe stomach upset during and up to 2 weeks afterwards on 2 separate occasions   Klonopin [Clonazepam] Nausea Only   Promethazine  Hcl Other (See Comments)    IV causes muscle tics, may take oral, or give benadryl  prior to IV   Topamax [Topiramate] Nausea And Vomiting    confusion   "

## 2024-12-19 NOTE — Patient Instructions (Addendum)
"   sinusitis bacterial and recurrent based on ongoing sinus pressure and congestion despite 2 antibiotic(s) rounds- doesn't tolerate Augmentin  and needs prolonged course- trial doxycycline  100 mg twice daily for 10 days -also lungs are flared up/asthma flared- restart Breztri  and tomorrow morning start prednisone  for 7 days - I want her back in office in 10 days if not improving or if symptom(s) worsen sooner  -get back in with allergy/asthma  [facility] contact- when things settle down to schedule colonoscopy when you are ready Address: [address] [place], [place], [place] [address] Phone: [phone]   Get mammogram updated and have them send us  a copy  Please stop by lab before you go If you have mychart- we will send your results within 3 business days of us  receiving them.  If you do not have mychart- we will call you about results within 5 business days of us  receiving them.  *please also note that you will see labs on mychart as soon as they post. I will later go in and write notes on them- will say notes from Dr. Katrinka   Recommended follow up: Return in about 6 months (around 06/18/2025) for followup or sooner if needed.Schedule b4 you leave. Unless seeing allergist/asthma again "

## 2024-12-20 LAB — LIPID PANEL
Cholesterol: 214 mg/dL — ABNORMAL HIGH
HDL: 55 mg/dL
LDL Cholesterol (Calc): 136 mg/dL — ABNORMAL HIGH
Non-HDL Cholesterol (Calc): 159 mg/dL — ABNORMAL HIGH
Total CHOL/HDL Ratio: 3.9 (calc)
Triglycerides: 123 mg/dL

## 2024-12-20 LAB — CBC WITH DIFFERENTIAL/PLATELET
Absolute Lymphocytes: 2400 {cells}/uL (ref 850–3900)
Absolute Monocytes: 563 {cells}/uL (ref 200–950)
Basophils Absolute: 60 {cells}/uL (ref 0–200)
Basophils Relative: 0.8 %
Eosinophils Absolute: 68 {cells}/uL (ref 15–500)
Eosinophils Relative: 0.9 %
HCT: 40.2 % (ref 35.9–46.0)
Hemoglobin: 13.6 g/dL (ref 11.7–15.5)
MCH: 31.3 pg (ref 27.0–33.0)
MCHC: 33.8 g/dL (ref 31.6–35.4)
MCV: 92.6 fL (ref 81.4–101.7)
MPV: 10.3 fL (ref 7.5–12.5)
Monocytes Relative: 7.5 %
Neutro Abs: 4410 {cells}/uL (ref 1500–7800)
Neutrophils Relative %: 58.8 %
Platelets: 350 10*3/uL (ref 140–400)
RBC: 4.34 Million/uL (ref 3.80–5.10)
RDW: 11.8 % (ref 11.0–15.0)
Total Lymphocyte: 32 %
WBC: 7.5 10*3/uL (ref 3.8–10.8)

## 2024-12-20 LAB — COMPREHENSIVE METABOLIC PANEL WITH GFR
AG Ratio: 2.5 (calc) (ref 1.0–2.5)
ALT: 16 U/L (ref 6–29)
AST: 15 U/L (ref 10–35)
Albumin: 4.9 g/dL (ref 3.6–5.1)
Alkaline phosphatase (APISO): 86 U/L (ref 31–125)
BUN: 10 mg/dL (ref 7–25)
CO2: 29 mmol/L (ref 20–32)
Calcium: 9.8 mg/dL (ref 8.6–10.2)
Chloride: 103 mmol/L (ref 98–110)
Creat: 0.75 mg/dL (ref 0.50–0.99)
Globulin: 2 g/dL (ref 1.9–3.7)
Glucose, Bld: 92 mg/dL (ref 65–99)
Potassium: 4.6 mmol/L (ref 3.5–5.3)
Sodium: 141 mmol/L (ref 135–146)
Total Bilirubin: 0.4 mg/dL (ref 0.2–1.2)
Total Protein: 6.9 g/dL (ref 6.1–8.1)
eGFR: 100 mL/min/{1.73_m2}

## 2024-12-20 LAB — IRON,TIBC AND FERRITIN PANEL
%SAT: 36 % (ref 16–45)
Ferritin: 46 ng/mL (ref 16–232)
Iron: 103 ug/dL (ref 40–190)
TIBC: 285 ug/dL (ref 250–450)

## 2024-12-20 LAB — HEMOGLOBIN A1C
Hgb A1c MFr Bld: 4.9 %
Mean Plasma Glucose: 94 mg/dL
eAG (mmol/L): 5.2 mmol/L

## 2024-12-20 LAB — VITAMIN D 25 HYDROXY (VIT D DEFICIENCY, FRACTURES): Vit D, 25-Hydroxy: 35 ng/mL (ref 30–100)

## 2024-12-20 LAB — VITAMIN B12: Vitamin B-12: 524 pg/mL (ref 200–1100)

## 2024-12-22 ENCOUNTER — Ambulatory Visit: Payer: Self-pay | Admitting: Family Medicine

## 2024-12-23 NOTE — Telephone Encounter (Signed)
 This patient also requested phenergan . You reached out and I have reached out about that.   Not sure that she needs both Zofran  and phernergan. Can we reach out again to see how often her headaches are? How often she uses zofran  and phenergan .

## 2025-05-19 ENCOUNTER — Ambulatory Visit: Admitting: Family Medicine

## 2025-09-15 ENCOUNTER — Telehealth: Admitting: Adult Health

## 2025-12-21 ENCOUNTER — Encounter: Admitting: Family Medicine
